# Patient Record
Sex: Female | Born: 1950
Health system: Southern US, Community
[De-identification: ages and names within clinical notes are randomized; demographics above are authoritative.]

## PROBLEM LIST (undated history)

## (undated) DIAGNOSIS — N951 Menopausal and female climacteric states: Secondary | ICD-10-CM

## (undated) DIAGNOSIS — E785 Hyperlipidemia, unspecified: Secondary | ICD-10-CM

## (undated) DIAGNOSIS — I10 Essential (primary) hypertension: Secondary | ICD-10-CM

## (undated) HISTORY — DX: Menopausal and female climacteric states: N95.1

## (undated) HISTORY — PX: TUBAL LIGATION: SHX77

## (undated) HISTORY — PX: MOUTH SURGERY: SHX715

## (undated) HISTORY — PX: TONSILLECTOMY: SUR1361

## (undated) HISTORY — DX: Essential (primary) hypertension: I10

## (undated) HISTORY — DX: Hyperlipidemia, unspecified: E78.5

---

## 1997-12-03 ENCOUNTER — Encounter: Admission: RE | Admit: 1997-12-03 | Discharge: 1997-12-03 | Payer: Self-pay | Admitting: Internal Medicine

## 1998-02-06 ENCOUNTER — Encounter: Admission: RE | Admit: 1998-02-06 | Discharge: 1998-02-06 | Payer: Self-pay | Admitting: Hematology and Oncology

## 1998-08-20 ENCOUNTER — Encounter: Payer: Self-pay | Admitting: Emergency Medicine

## 1998-08-20 ENCOUNTER — Emergency Department (HOSPITAL_COMMUNITY): Admission: EM | Admit: 1998-08-20 | Discharge: 1998-08-20 | Payer: Self-pay | Admitting: Internal Medicine

## 1998-11-09 ENCOUNTER — Emergency Department (HOSPITAL_COMMUNITY): Admission: EM | Admit: 1998-11-09 | Discharge: 1998-11-09 | Payer: Self-pay | Admitting: Emergency Medicine

## 1998-11-09 ENCOUNTER — Encounter: Payer: Self-pay | Admitting: Emergency Medicine

## 1999-08-10 ENCOUNTER — Emergency Department (HOSPITAL_COMMUNITY): Admission: EM | Admit: 1999-08-10 | Discharge: 1999-08-10 | Payer: Self-pay | Admitting: Internal Medicine

## 1999-08-24 ENCOUNTER — Emergency Department (HOSPITAL_COMMUNITY): Admission: EM | Admit: 1999-08-24 | Discharge: 1999-08-24 | Payer: Self-pay | Admitting: *Deleted

## 2000-01-28 ENCOUNTER — Encounter: Admission: RE | Admit: 2000-01-28 | Discharge: 2000-01-28 | Payer: Self-pay | Admitting: Internal Medicine

## 2000-02-11 ENCOUNTER — Encounter: Admission: RE | Admit: 2000-02-11 | Discharge: 2000-02-11 | Payer: Self-pay | Admitting: Internal Medicine

## 2000-06-14 ENCOUNTER — Encounter: Payer: Self-pay | Admitting: Emergency Medicine

## 2000-06-14 ENCOUNTER — Emergency Department (HOSPITAL_COMMUNITY): Admission: EM | Admit: 2000-06-14 | Discharge: 2000-06-14 | Payer: Self-pay | Admitting: Emergency Medicine

## 2000-06-28 ENCOUNTER — Emergency Department (HOSPITAL_COMMUNITY): Admission: EM | Admit: 2000-06-28 | Discharge: 2000-06-28 | Payer: Self-pay | Admitting: Emergency Medicine

## 2000-11-21 ENCOUNTER — Encounter: Admission: RE | Admit: 2000-11-21 | Discharge: 2000-11-21 | Payer: Self-pay | Admitting: Hematology and Oncology

## 2000-11-23 ENCOUNTER — Encounter: Payer: Self-pay | Admitting: Emergency Medicine

## 2000-11-23 ENCOUNTER — Emergency Department (HOSPITAL_COMMUNITY): Admission: EM | Admit: 2000-11-23 | Discharge: 2000-11-23 | Payer: Self-pay | Admitting: Emergency Medicine

## 2001-03-05 ENCOUNTER — Encounter: Admission: RE | Admit: 2001-03-05 | Discharge: 2001-03-05 | Payer: Self-pay | Admitting: Internal Medicine

## 2001-10-25 ENCOUNTER — Emergency Department (HOSPITAL_COMMUNITY): Admission: EM | Admit: 2001-10-25 | Discharge: 2001-10-25 | Payer: Self-pay | Admitting: Emergency Medicine

## 2002-01-22 ENCOUNTER — Emergency Department (HOSPITAL_COMMUNITY): Admission: EM | Admit: 2002-01-22 | Discharge: 2002-01-22 | Payer: Self-pay | Admitting: Emergency Medicine

## 2002-02-03 ENCOUNTER — Emergency Department (HOSPITAL_COMMUNITY): Admission: EM | Admit: 2002-02-03 | Discharge: 2002-02-03 | Payer: Self-pay | Admitting: Emergency Medicine

## 2002-08-23 ENCOUNTER — Emergency Department (HOSPITAL_COMMUNITY): Admission: EM | Admit: 2002-08-23 | Discharge: 2002-08-23 | Payer: Self-pay | Admitting: Emergency Medicine

## 2002-08-26 ENCOUNTER — Encounter: Payer: Self-pay | Admitting: Internal Medicine

## 2002-08-26 ENCOUNTER — Emergency Department (HOSPITAL_COMMUNITY): Admission: EM | Admit: 2002-08-26 | Discharge: 2002-08-26 | Payer: Self-pay | Admitting: Emergency Medicine

## 2002-09-04 ENCOUNTER — Encounter: Admission: RE | Admit: 2002-09-04 | Discharge: 2002-09-04 | Payer: Self-pay | Admitting: Internal Medicine

## 2002-11-28 ENCOUNTER — Emergency Department (HOSPITAL_COMMUNITY): Admission: EM | Admit: 2002-11-28 | Discharge: 2002-11-28 | Payer: Self-pay | Admitting: Emergency Medicine

## 2002-11-28 ENCOUNTER — Encounter: Payer: Self-pay | Admitting: Emergency Medicine

## 2002-12-06 ENCOUNTER — Emergency Department (HOSPITAL_COMMUNITY): Admission: EM | Admit: 2002-12-06 | Discharge: 2002-12-07 | Payer: Self-pay | Admitting: Emergency Medicine

## 2002-12-11 ENCOUNTER — Encounter: Admission: RE | Admit: 2002-12-11 | Discharge: 2002-12-11 | Payer: Self-pay | Admitting: Internal Medicine

## 2003-01-13 ENCOUNTER — Encounter: Payer: Self-pay | Admitting: Internal Medicine

## 2003-01-13 ENCOUNTER — Ambulatory Visit (HOSPITAL_COMMUNITY): Admission: RE | Admit: 2003-01-13 | Discharge: 2003-01-13 | Payer: Self-pay | Admitting: Internal Medicine

## 2003-01-20 ENCOUNTER — Emergency Department (HOSPITAL_COMMUNITY): Admission: EM | Admit: 2003-01-20 | Discharge: 2003-01-20 | Payer: Self-pay | Admitting: Emergency Medicine

## 2003-01-22 ENCOUNTER — Encounter: Admission: RE | Admit: 2003-01-22 | Discharge: 2003-01-22 | Payer: Self-pay | Admitting: Internal Medicine

## 2003-10-01 ENCOUNTER — Encounter: Admission: RE | Admit: 2003-10-01 | Discharge: 2003-10-01 | Payer: Self-pay | Admitting: Internal Medicine

## 2003-12-28 ENCOUNTER — Emergency Department (HOSPITAL_COMMUNITY): Admission: EM | Admit: 2003-12-28 | Discharge: 2003-12-28 | Payer: Self-pay | Admitting: Emergency Medicine

## 2003-12-30 ENCOUNTER — Emergency Department (HOSPITAL_COMMUNITY): Admission: EM | Admit: 2003-12-30 | Discharge: 2003-12-30 | Payer: Self-pay | Admitting: Emergency Medicine

## 2003-12-30 IMAGING — CT CT HEAD W/O CM
1 series · 15 of 28 positions shown, 19 images · non-contrast
Comparison: none

CLINICAL DATA: Severe headache.  Chest pain and positive D-dimer.  
HEAD CT WITHOUT CONTRAST
Routine non-contrast head CT was performed.

[Series 2: head routi 5.0 h30s · axial · 0.43mm/px · z∈[+928,+1053]mm · 15 of 28 slices shown, 19 images]
[im 2/28  brain]
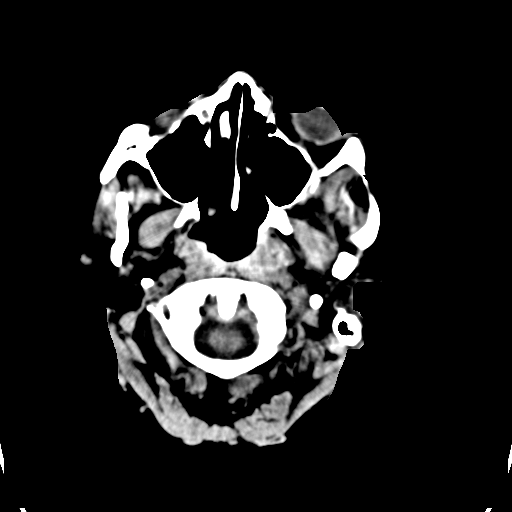
[im 2/28  bone]
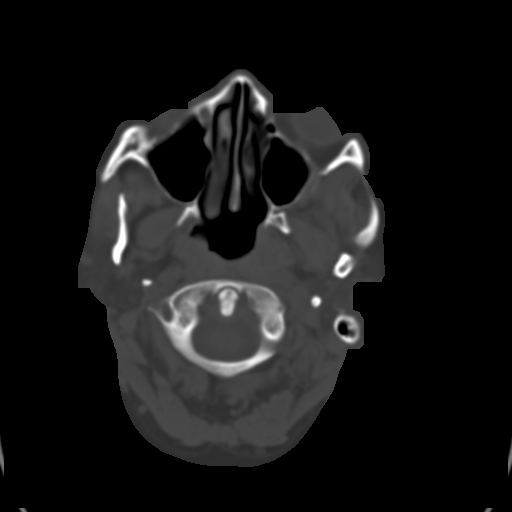
[im 4/28  brain]
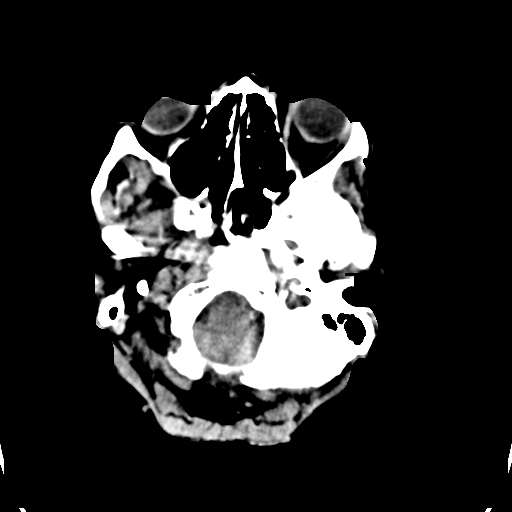
[im 6/28  brain]
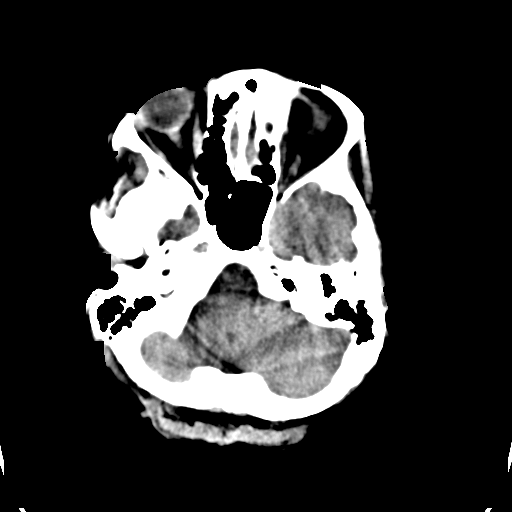
[im 8/28  brain]
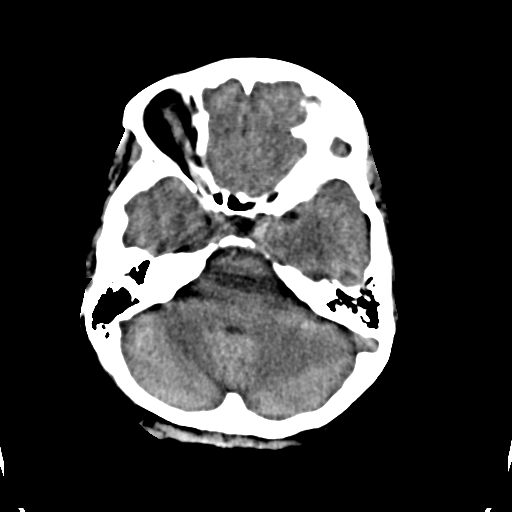
[im 9/28  brain]
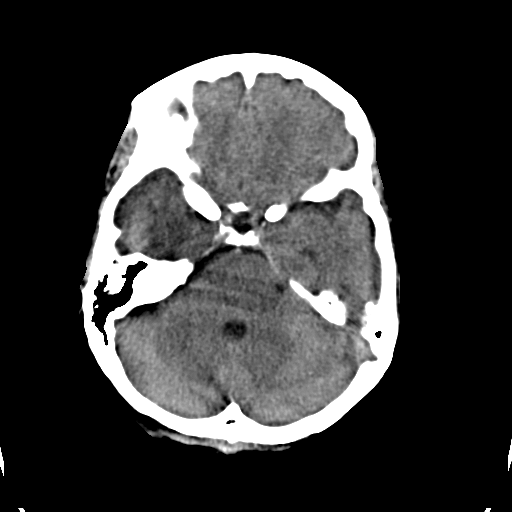
[im 9/28  bone]
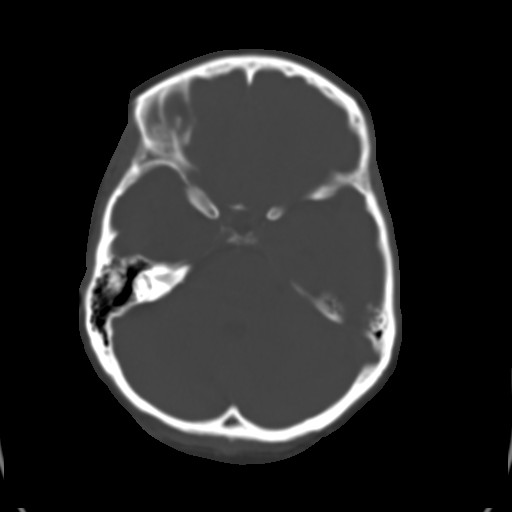
[im 11/28  brain]
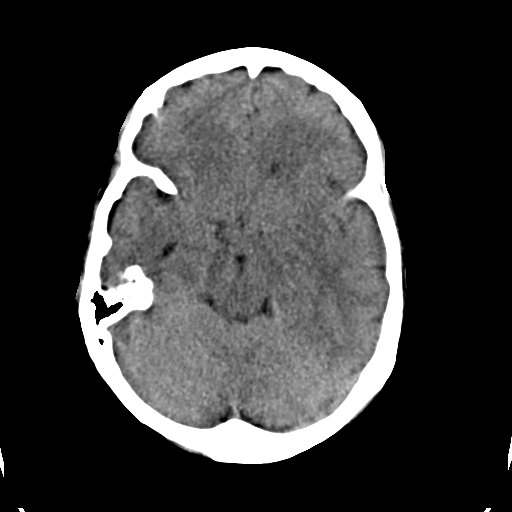
[im 13/28  brain]
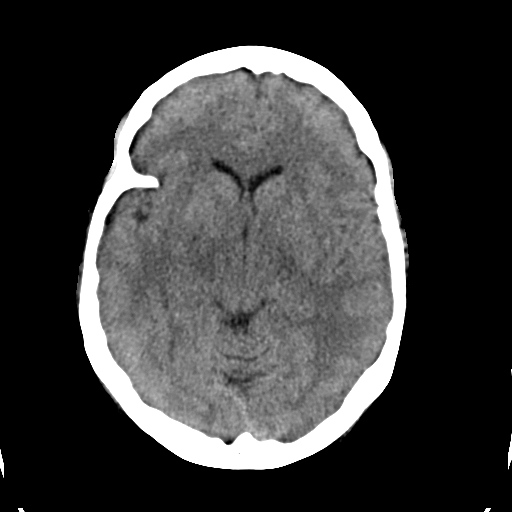
[im 15/28  brain]
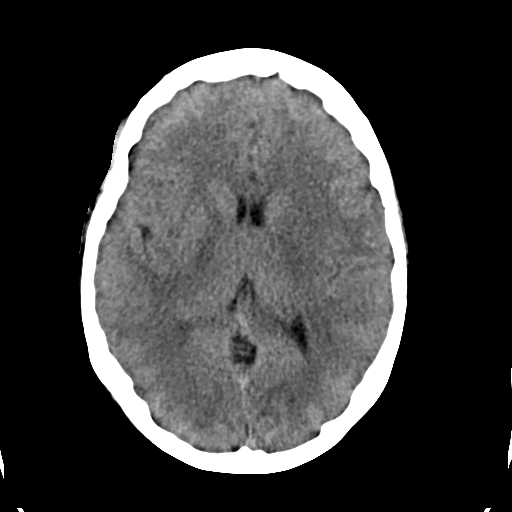
[im 16/28  brain]
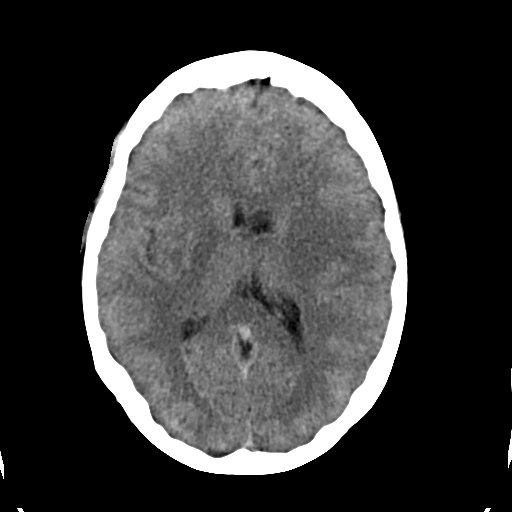
[im 16/28  bone]
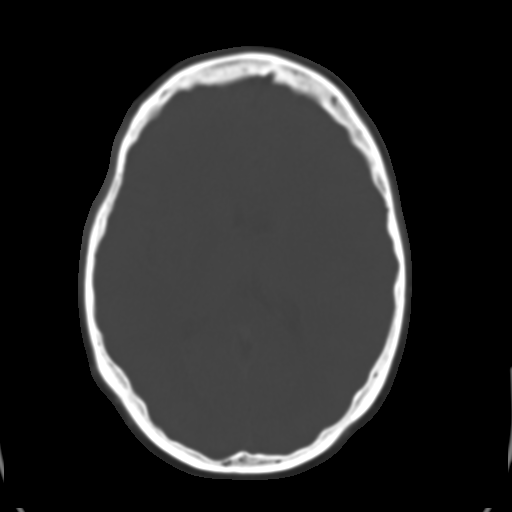
[im 18/28  brain]
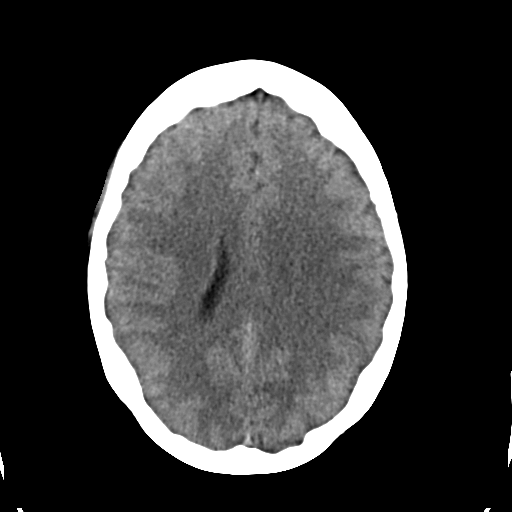
[im 20/28  brain]
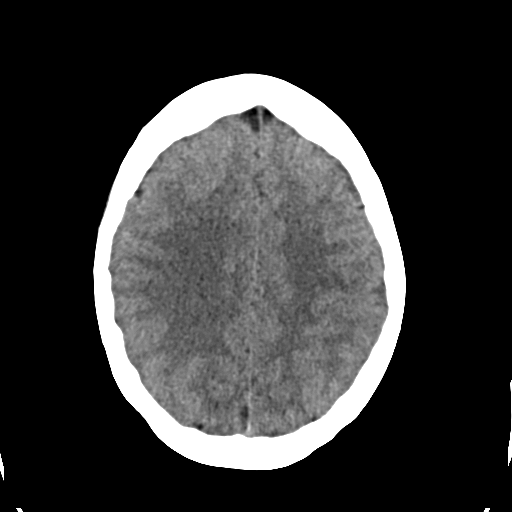
[im 21/28  brain]
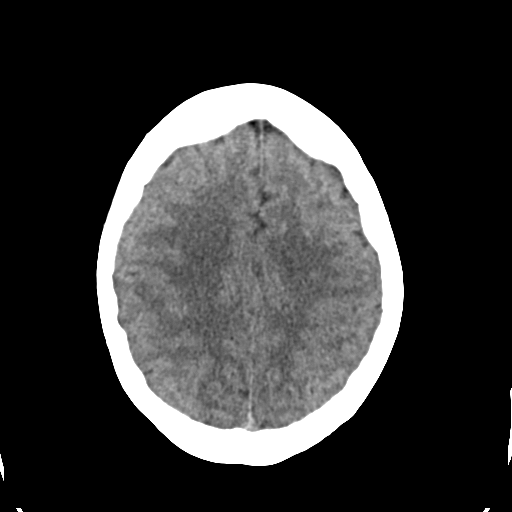
[im 23/28  brain]
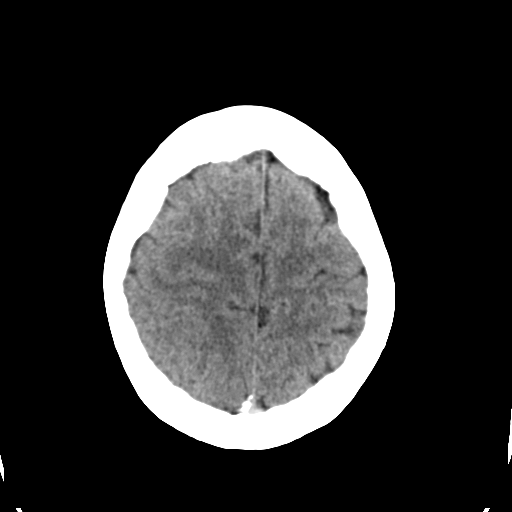
[im 23/28  bone]
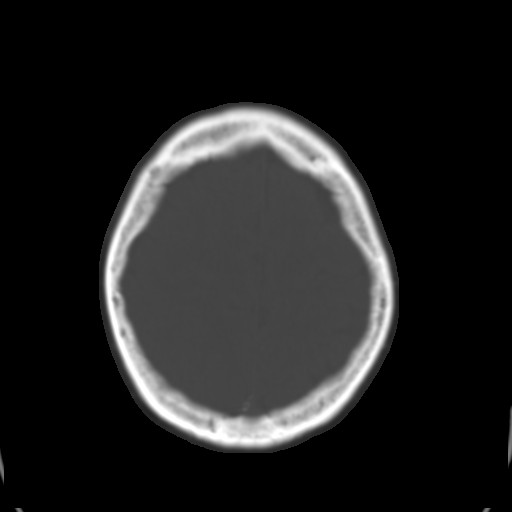
[im 25/28  brain]
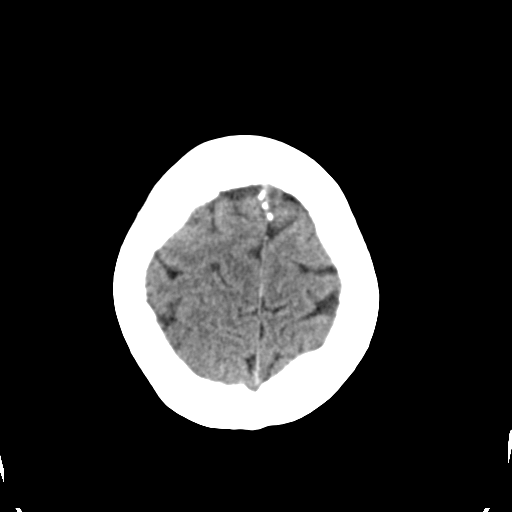
[im 27/28  brain]
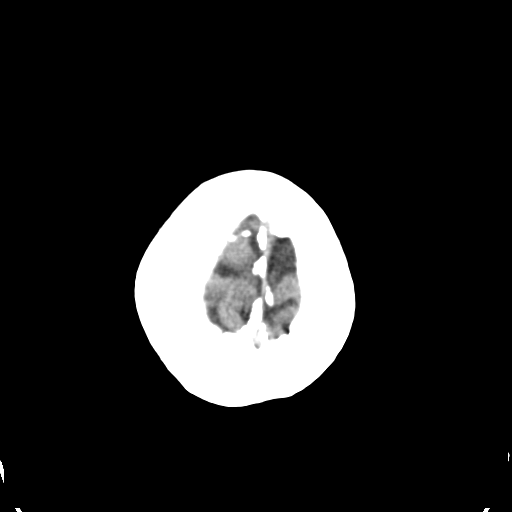

[15 of 28 positions shown; findings below may reference images not displayed]

There is no evidence of intracranial hemorrhage, brain edema, or mass effect. The ventricles are normal. No extra-axial abnormalities are identified. Bone windows show no significant abnormalities.

IMPRESSION
Negative non-contrast head CT. 

CHEST CT ANGIO WITH CONTRAST:

Multidetector CT imaging of the chest was performed according to the protocol for detection of pulmonary embolism during IV bolus injection of 150 ml Omnipaque 300.  Coronal and sagittal plane reformatted images were also generated.  
Satisfactory opacification of the pulmonary arteries is seen, and there is no evidence of pulmonary embolism . There is no evidence of thoracic aortic aneurysm or dissection.  There is no evidence of hilar or mediastinal masses.  Mild pleural-parenchymal scarring is seen in the lateral aspect of the right lower lobe.  There is no evidence of pulmonary infiltrate or suspicious pulmonary nodules or masses.  There is no evidence of pleural or pericardial effusion.
IMPRESSION
1.  No CT evidence of acute pulmonary embolism or other active disease.  
2.  Mild right lower lobe scarring.

## 2003-12-30 IMAGING — CR DG CHEST 2V
2 series · 2 of 2 positions shown · non-contrast
Comparison: none

CLINICAL DATA: Chest pain. Hypertension.  
 TWO VIEW CHEST
 No priors for comparison.  Borderline cardiomegaly.  No focal infiltrates or CHF.  No pulmonary  nodules are identified.  There is no pneumothorax. Mildly tortuous aorta.  Bones unremarkable. Minimal scarring right base. 
 IMPRESSION
 Borderline cardiomegaly, no active process.

[view not recorded (1 of 2)]
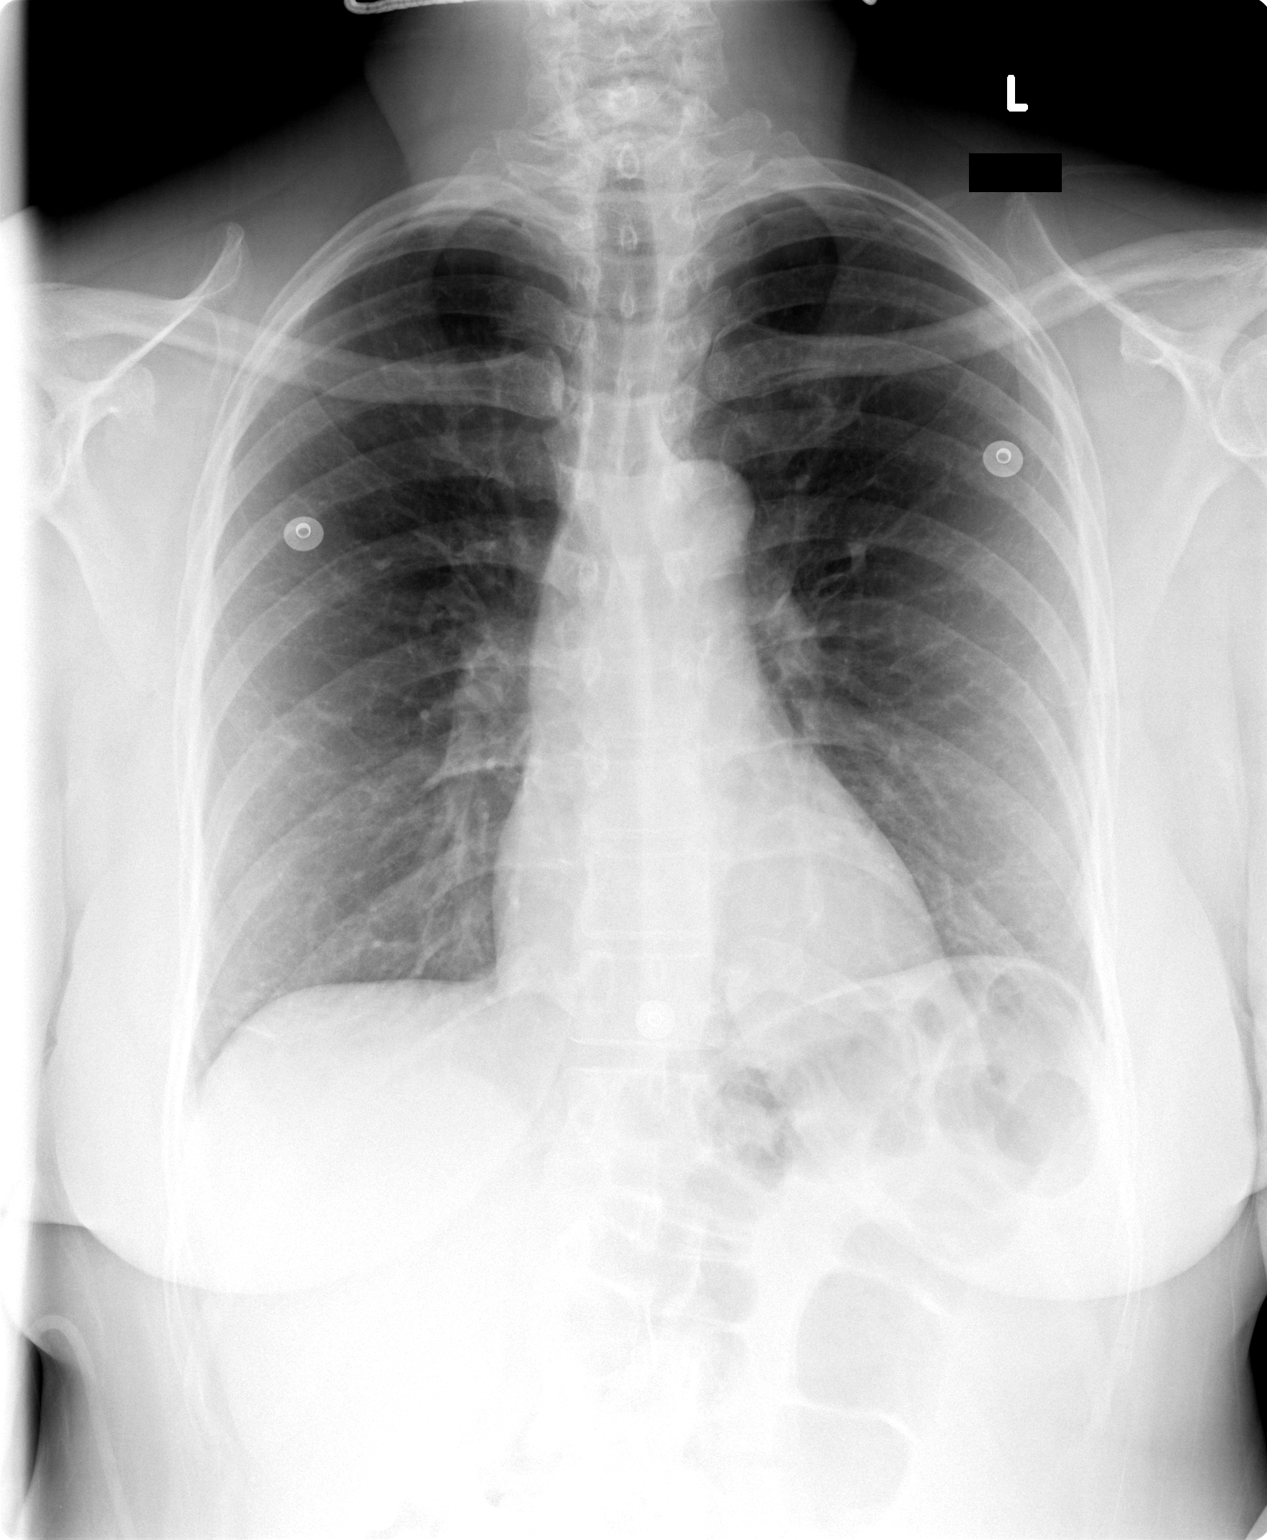

[view not recorded (2 of 2)]
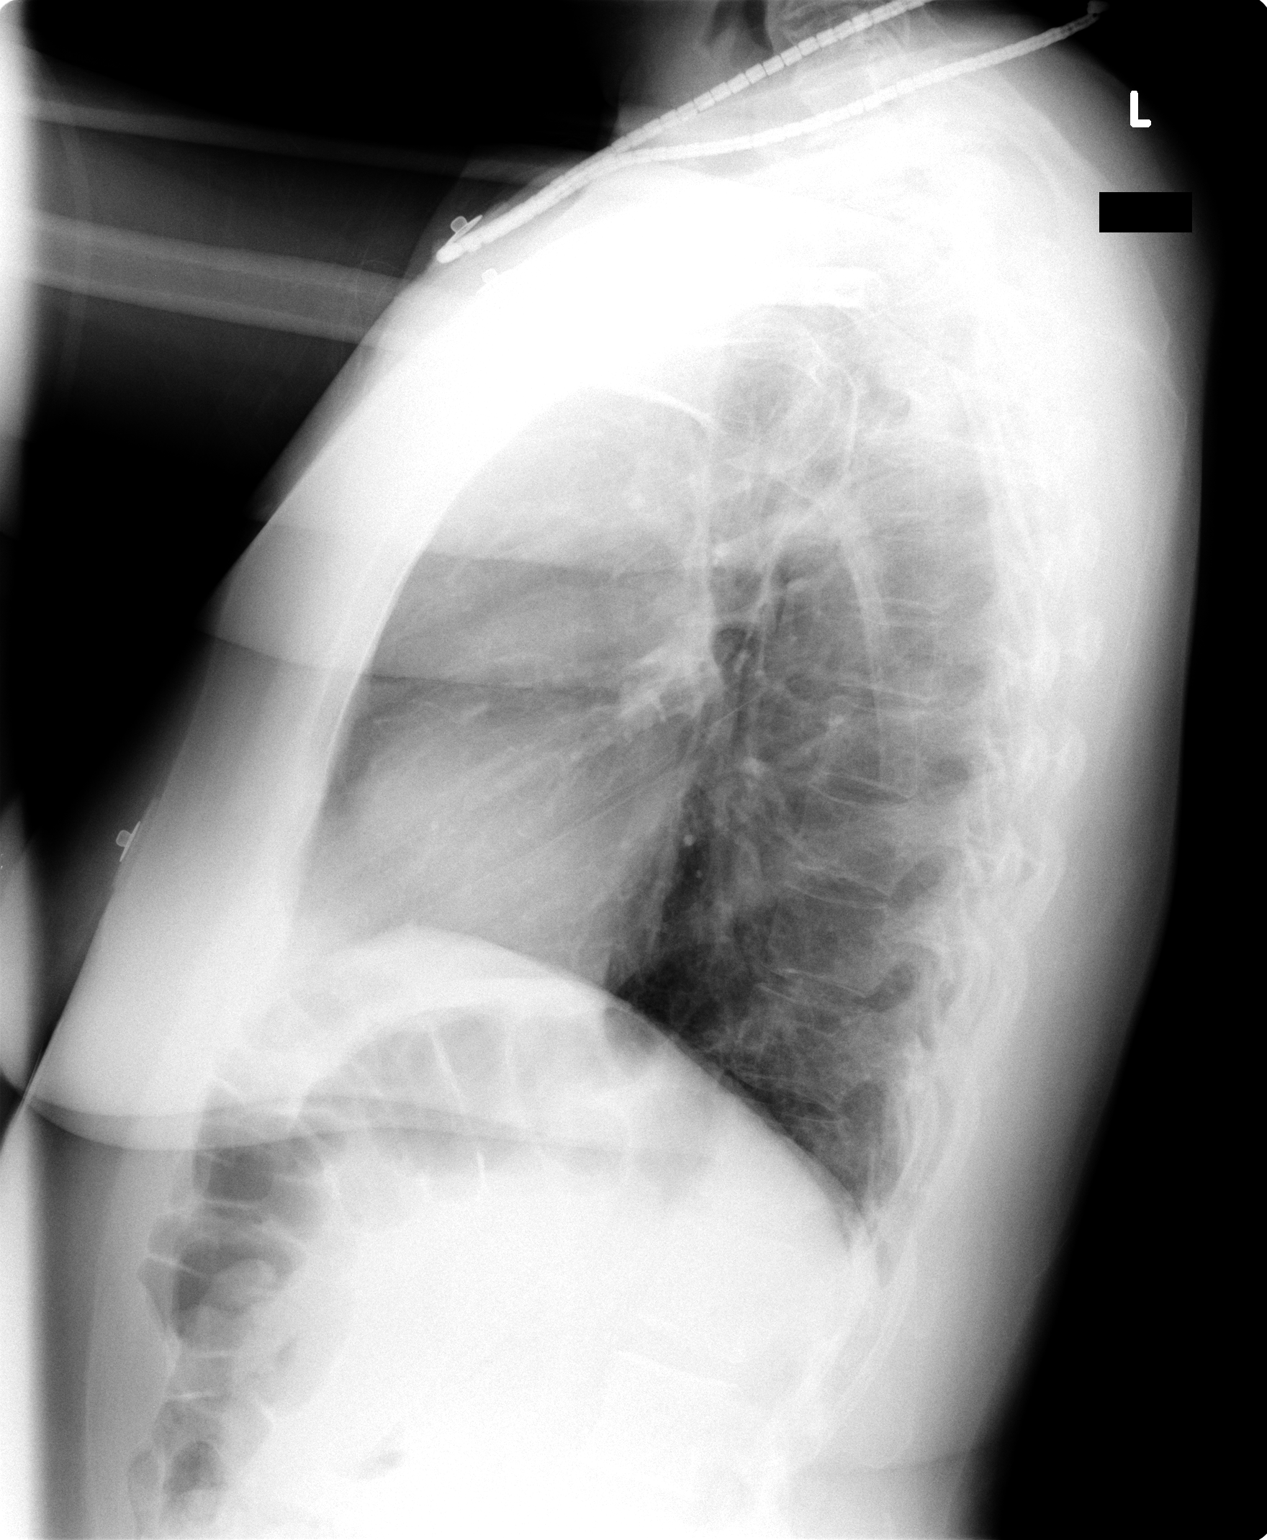

[2 of 2 positions shown; findings below may reference images not displayed]

## 2003-12-30 IMAGING — CT CT HEAD W/O CM
1 of 3 series · 15 of 30 positions shown, 19 images · non-contrast
Comparison: none

CLINICAL DATA: Severe headache.  Chest pain and positive D-dimer.  
HEAD CT WITHOUT CONTRAST
Routine non-contrast head CT was performed.

[Series 5: chest/pe 1.0 b10f · axial · 0.58mm/px · z∈[+159,+401]mm · 15 of 528 slices shown, 19 images]
[im 22/528  brain]
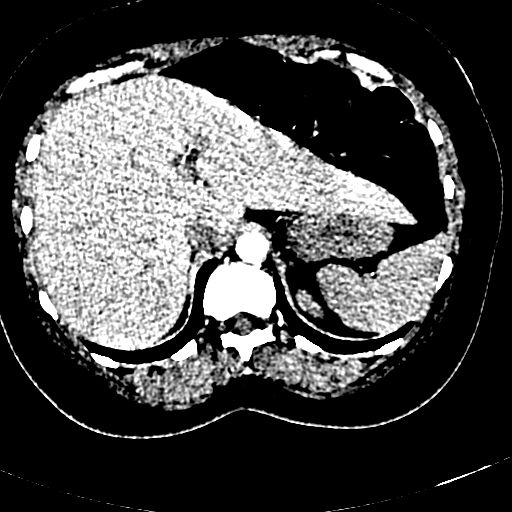
[im 22/528  bone]
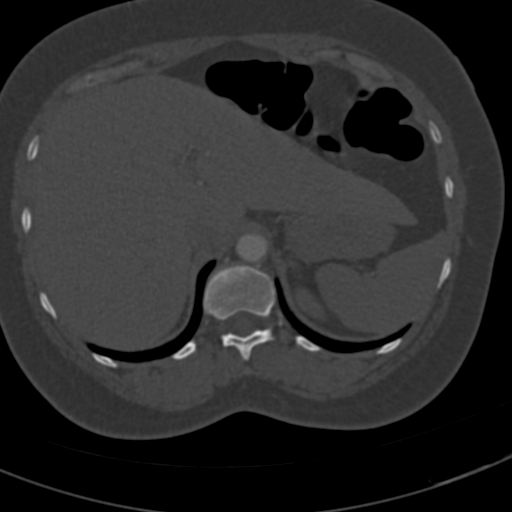
[im 66/528  brain]
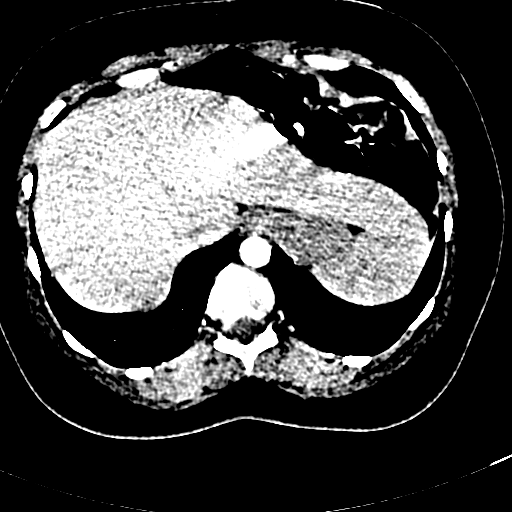
[im 88/528  brain]
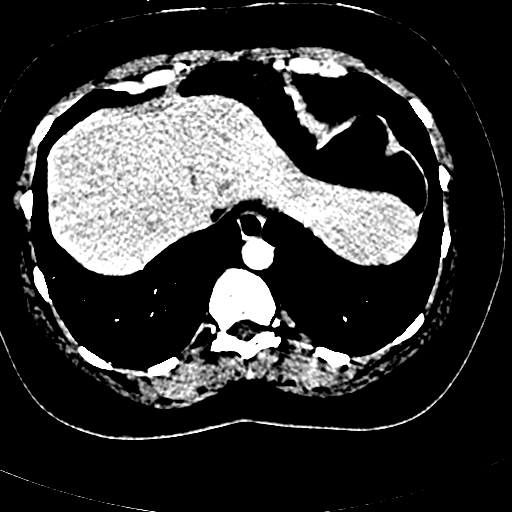
[im 132/528  brain]
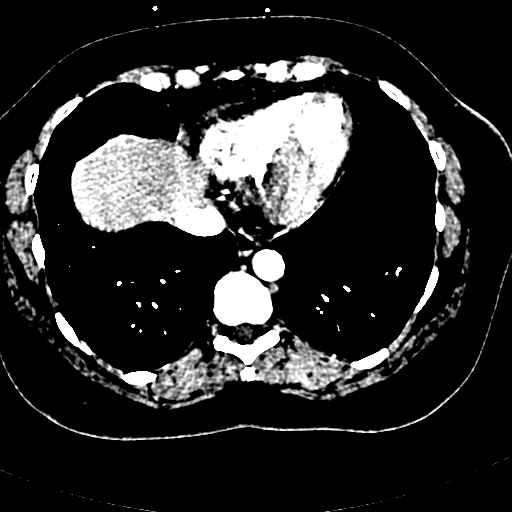
[im 154/528  brain]
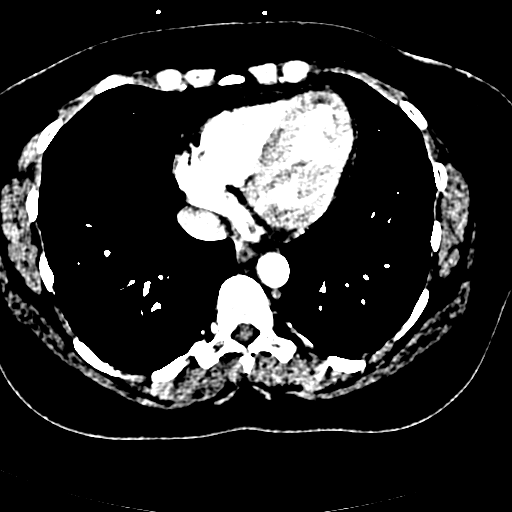
[im 154/528  bone]
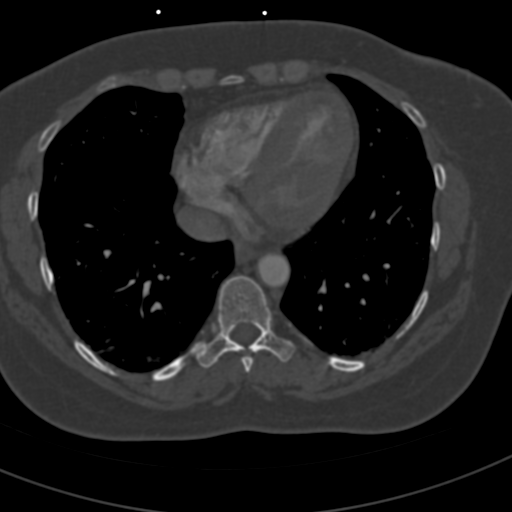
[im 198/528  brain]
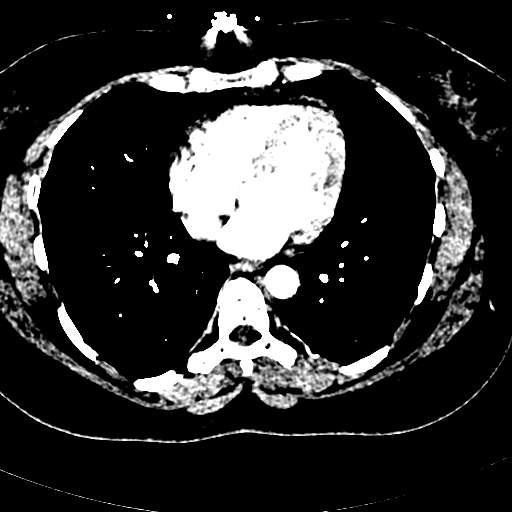
[im 220/528  brain]
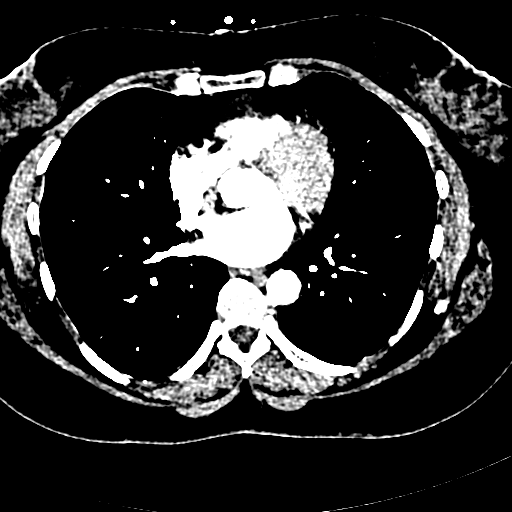
[im 264/528  brain]
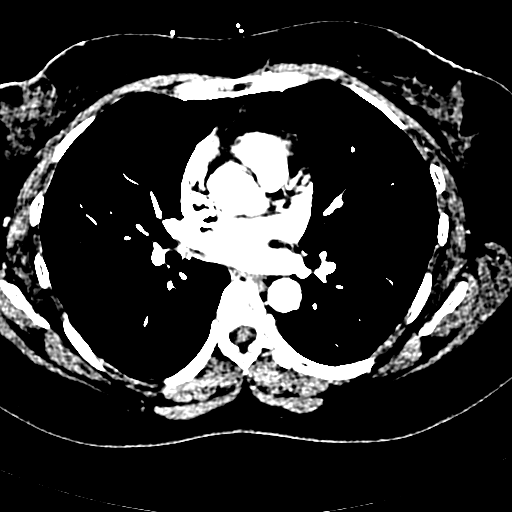
[im 308/528  brain]
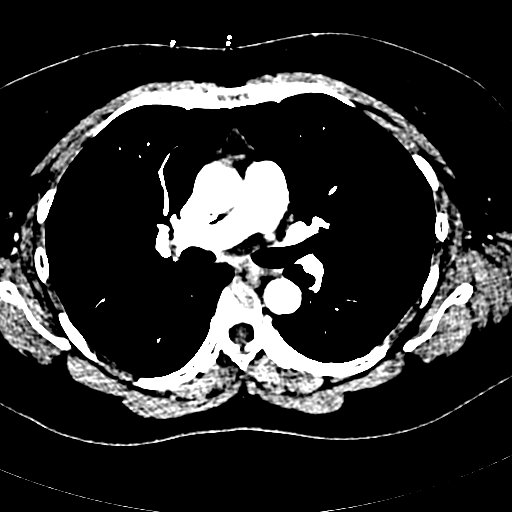
[im 308/528  bone]
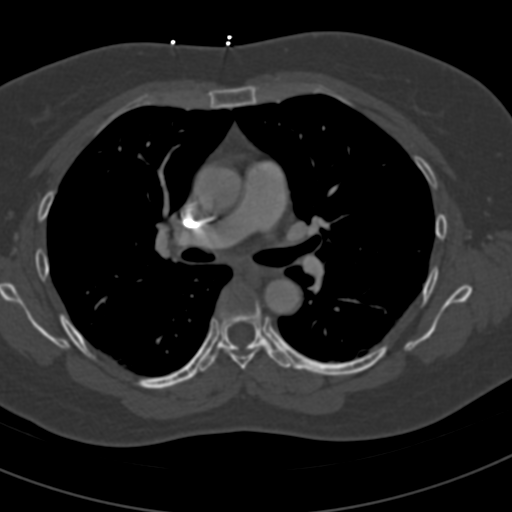
[im 330/528  brain]
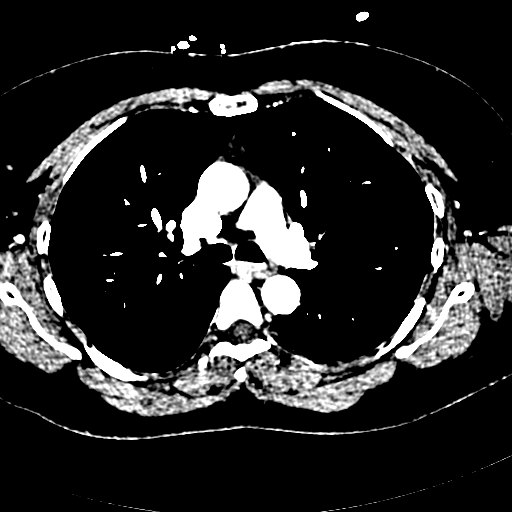
[im 374/528  brain]
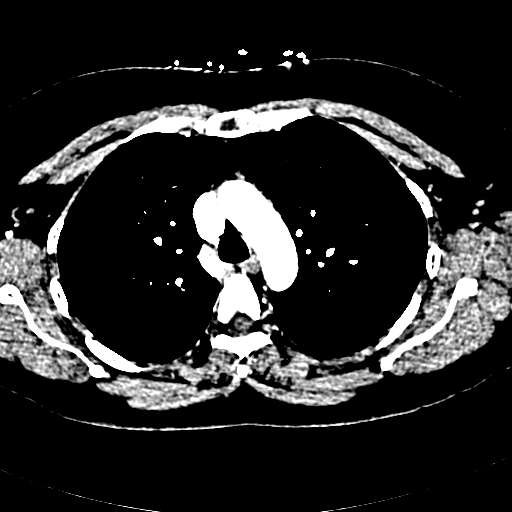
[im 396/528  brain]
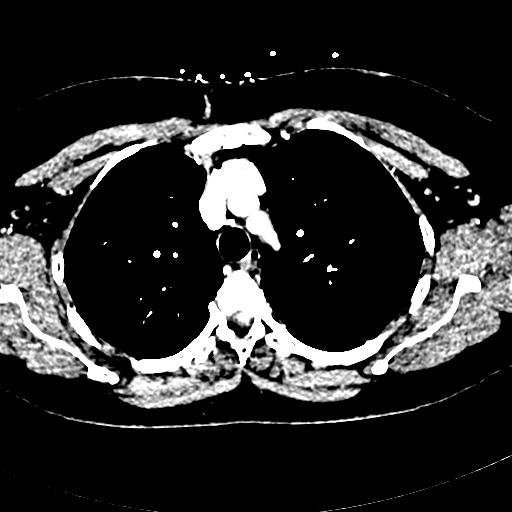
[im 440/528  brain]
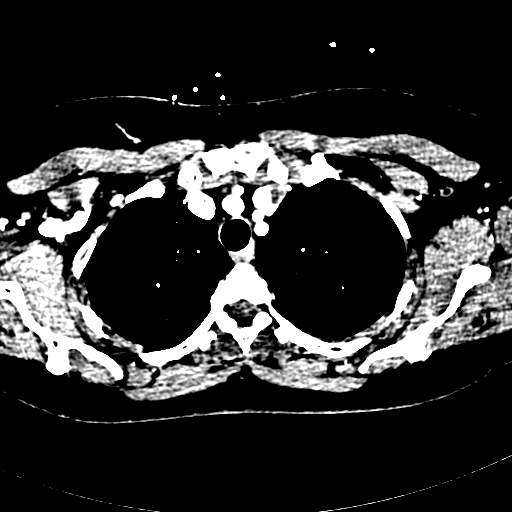
[im 440/528  bone]
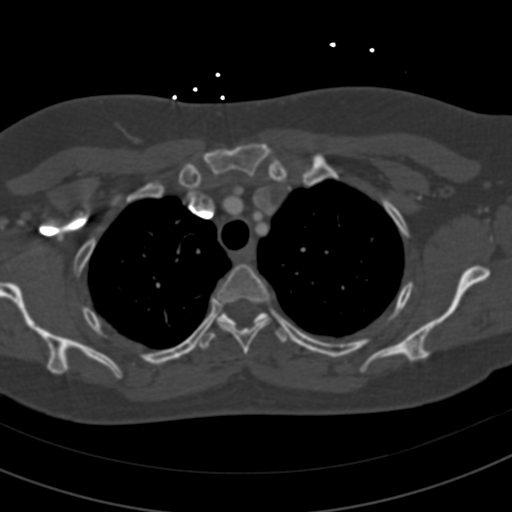
[im 462/528  brain]
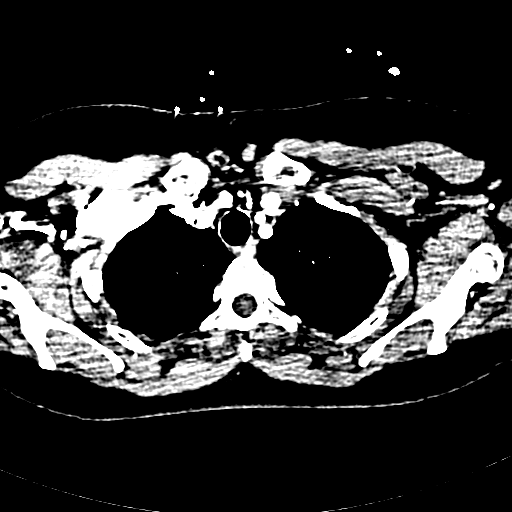
[im 506/528  brain]
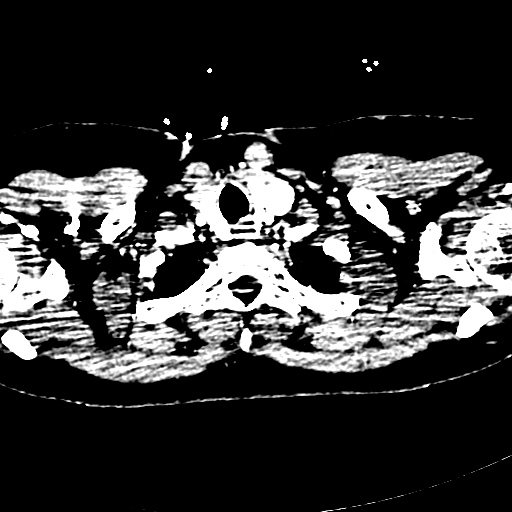

[15 of 30 positions shown; findings below may reference images not displayed]

There is no evidence of intracranial hemorrhage, brain edema, or mass effect. The ventricles are normal. No extra-axial abnormalities are identified. Bone windows show no significant abnormalities.

IMPRESSION
Negative non-contrast head CT. 

CHEST CT ANGIO WITH CONTRAST:

Multidetector CT imaging of the chest was performed according to the protocol for detection of pulmonary embolism during IV bolus injection of 150 ml Omnipaque 300.  Coronal and sagittal plane reformatted images were also generated.  
Satisfactory opacification of the pulmonary arteries is seen, and there is no evidence of pulmonary embolism . There is no evidence of thoracic aortic aneurysm or dissection.  There is no evidence of hilar or mediastinal masses.  Mild pleural-parenchymal scarring is seen in the lateral aspect of the right lower lobe.  There is no evidence of pulmonary infiltrate or suspicious pulmonary nodules or masses.  There is no evidence of pleural or pericardial effusion.
IMPRESSION
1.  No CT evidence of acute pulmonary embolism or other active disease.  
2.  Mild right lower lobe scarring.

## 2004-01-06 ENCOUNTER — Encounter: Admission: RE | Admit: 2004-01-06 | Discharge: 2004-01-06 | Payer: Self-pay | Admitting: Internal Medicine

## 2004-05-03 ENCOUNTER — Emergency Department (HOSPITAL_COMMUNITY): Admission: EM | Admit: 2004-05-03 | Discharge: 2004-05-03 | Payer: Self-pay | Admitting: Family Medicine

## 2004-08-30 ENCOUNTER — Ambulatory Visit: Payer: Self-pay | Admitting: Internal Medicine

## 2004-09-02 ENCOUNTER — Ambulatory Visit (HOSPITAL_COMMUNITY): Admission: RE | Admit: 2004-09-02 | Discharge: 2004-09-02 | Payer: Self-pay | Admitting: Internal Medicine

## 2004-09-16 ENCOUNTER — Encounter: Admission: RE | Admit: 2004-09-16 | Discharge: 2004-09-16 | Payer: Self-pay | Admitting: Sports Medicine

## 2004-10-25 ENCOUNTER — Emergency Department (HOSPITAL_COMMUNITY): Admission: EM | Admit: 2004-10-25 | Discharge: 2004-10-25 | Payer: Self-pay | Admitting: Emergency Medicine

## 2004-10-27 ENCOUNTER — Ambulatory Visit: Payer: Self-pay | Admitting: Internal Medicine

## 2005-03-15 ENCOUNTER — Emergency Department (HOSPITAL_COMMUNITY): Admission: EM | Admit: 2005-03-15 | Discharge: 2005-03-16 | Payer: Self-pay | Admitting: Emergency Medicine

## 2005-03-15 IMAGING — CR DG CHEST 2V
2 series · 2 of 2 positions shown · non-contrast
Comparison: [DATE]

CLINICAL DATA: Chest pain

CHEST - 2 VIEW:

[view not recorded (1 of 2)]
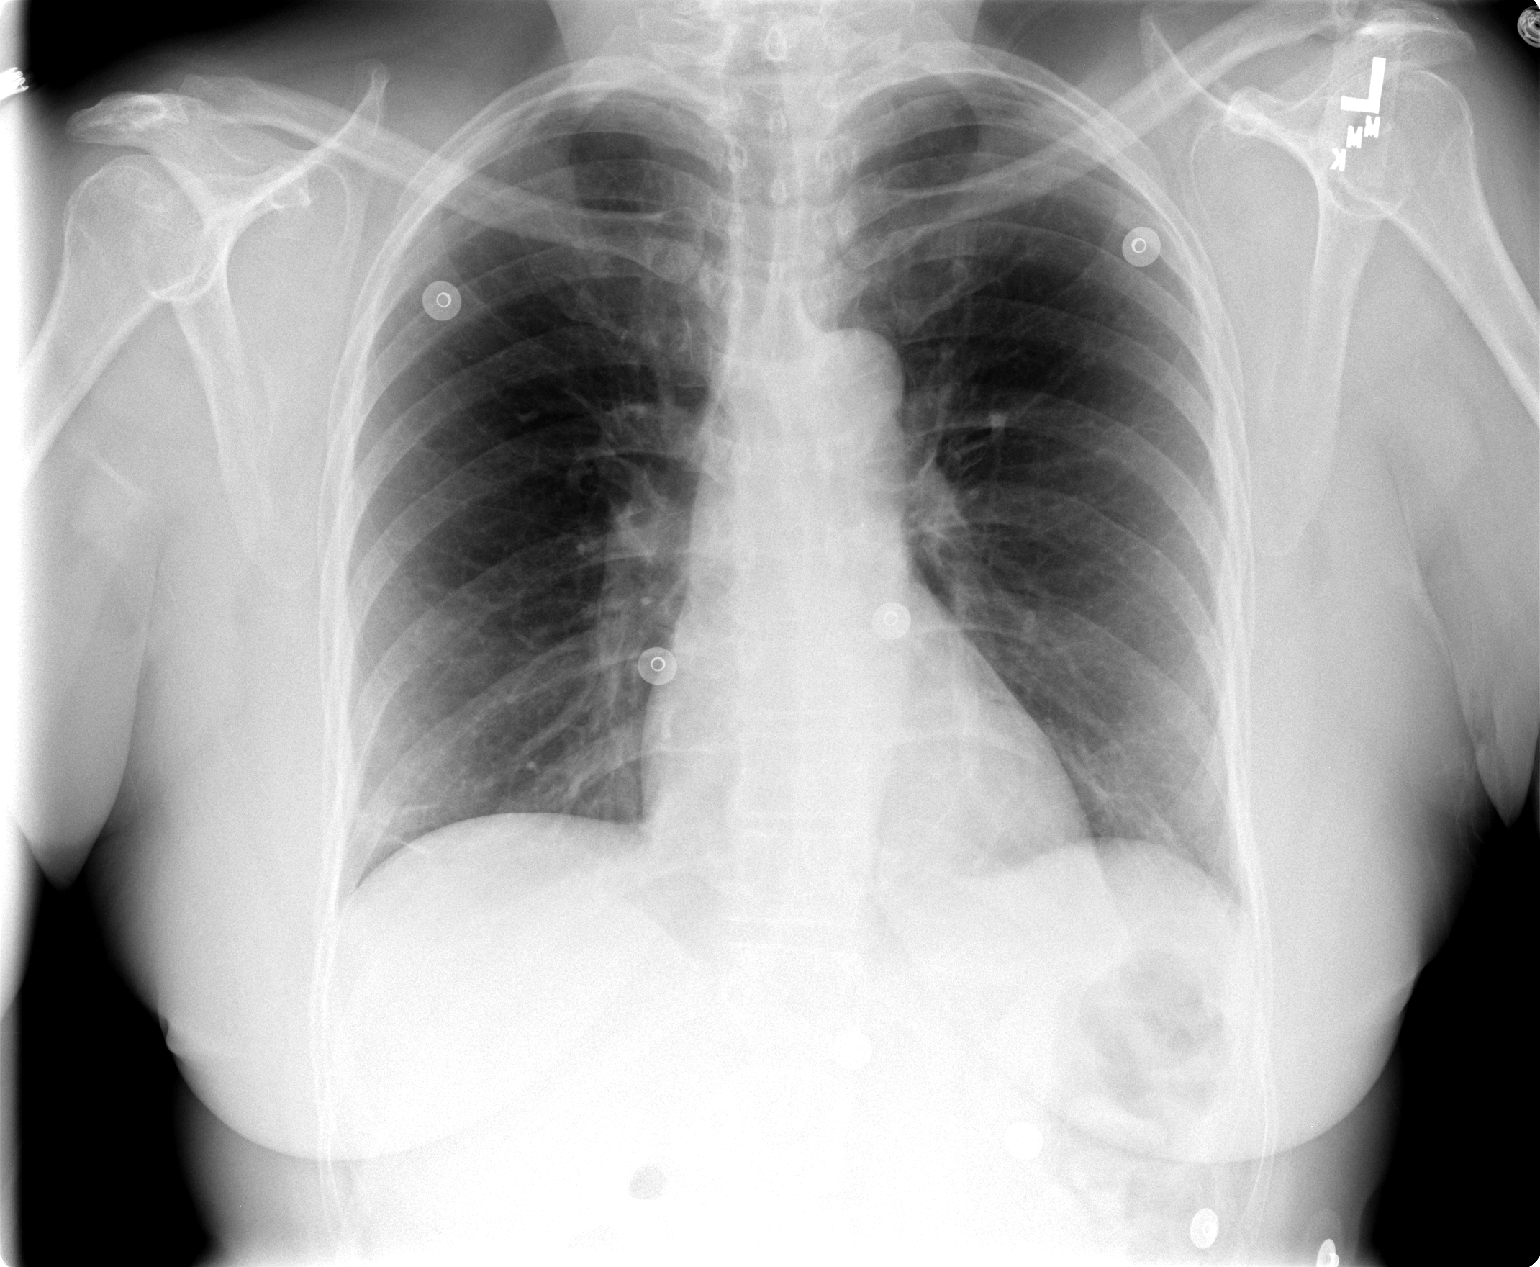

[view not recorded (2 of 2)]
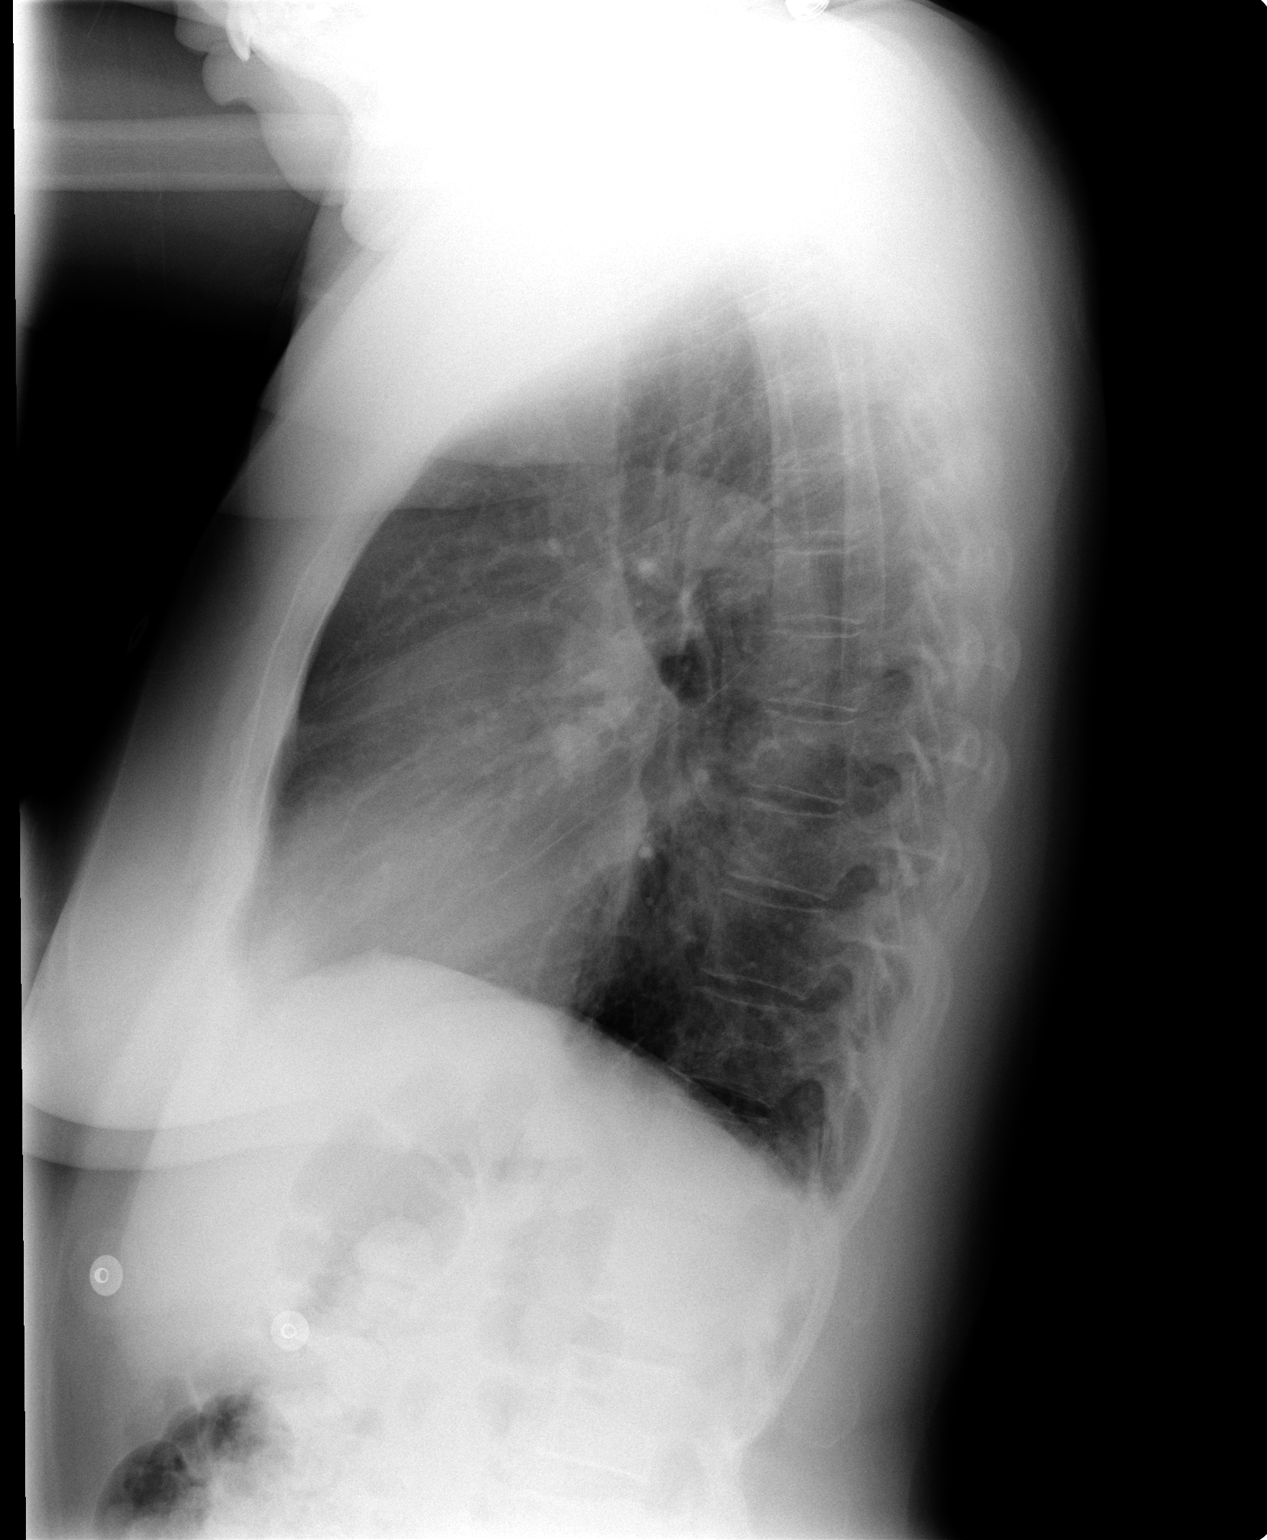

[2 of 2 positions shown; findings below may reference images not displayed]

FINDINGS: Heart and mediastinal contours are within normal limits. Linear
densities are noted in both lung bases, stable since prior study, likely
scarring. No effusions. Visualized skeleton unremarkable
IMPRESSION: Bibasilar scar. No active disease.

## 2005-05-04 ENCOUNTER — Ambulatory Visit: Payer: Self-pay | Admitting: Internal Medicine

## 2005-05-11 ENCOUNTER — Ambulatory Visit: Payer: Self-pay | Admitting: Internal Medicine

## 2005-06-17 ENCOUNTER — Ambulatory Visit: Payer: Self-pay | Admitting: Internal Medicine

## 2005-11-03 ENCOUNTER — Ambulatory Visit (HOSPITAL_COMMUNITY): Admission: RE | Admit: 2005-11-03 | Discharge: 2005-11-03 | Payer: Self-pay | Admitting: Internal Medicine

## 2005-11-03 ENCOUNTER — Ambulatory Visit: Payer: Self-pay | Admitting: Internal Medicine

## 2005-11-03 ENCOUNTER — Encounter (INDEPENDENT_AMBULATORY_CARE_PROVIDER_SITE_OTHER): Payer: Self-pay | Admitting: Internal Medicine

## 2005-11-03 IMAGING — MG MM DIGITAL SCREENING BILAT
6 series · 6 of 6 positions shown · non-contrast
Comparison: none

Bilateral CC and MLO view(s) were taken.

SCREENING MAMMOGRAM:
There is a fibroglandular pattern.  No masses or malignant type calcifications are identified.  
Compared with prior studies.

[R CC]
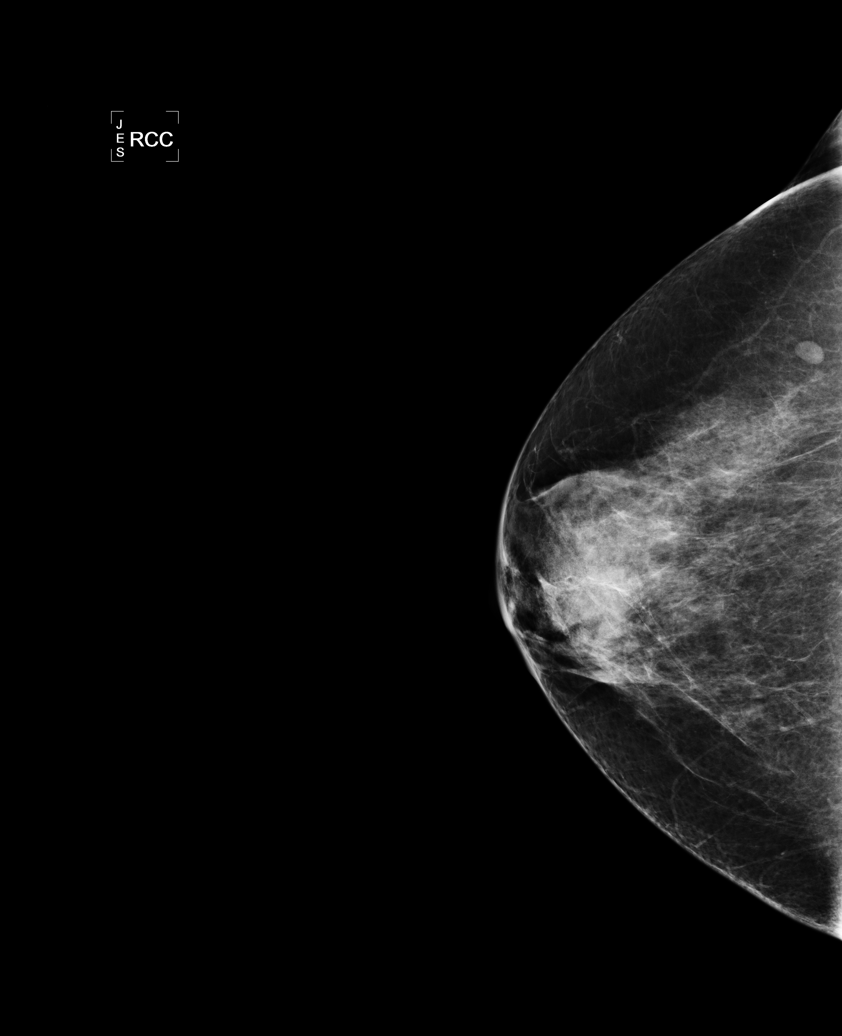

[R MLO (1 of 2)]
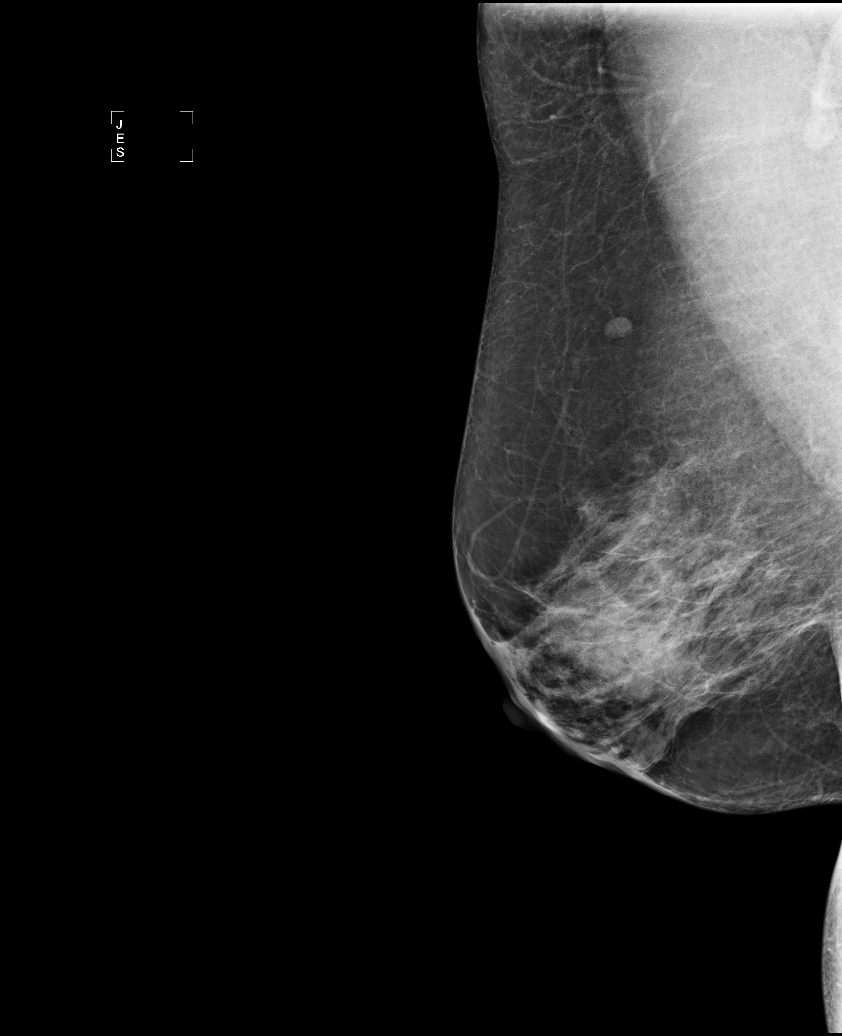

[L CC]
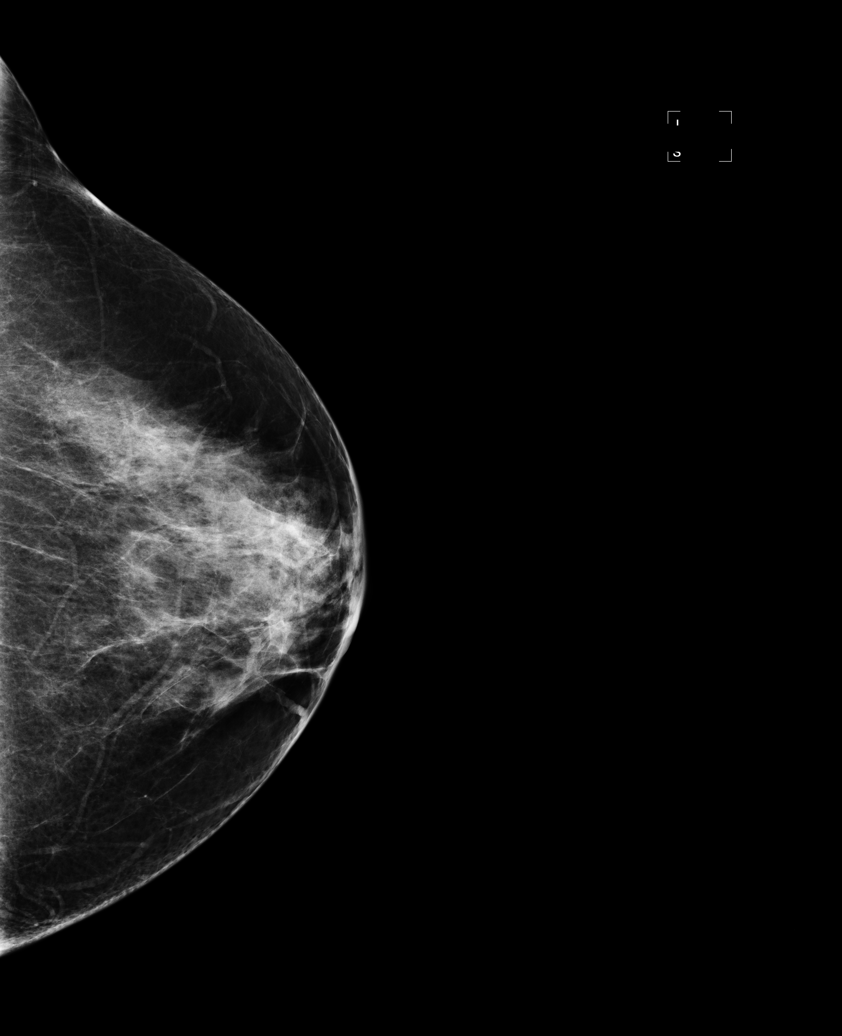

[L MLO (1 of 2)]
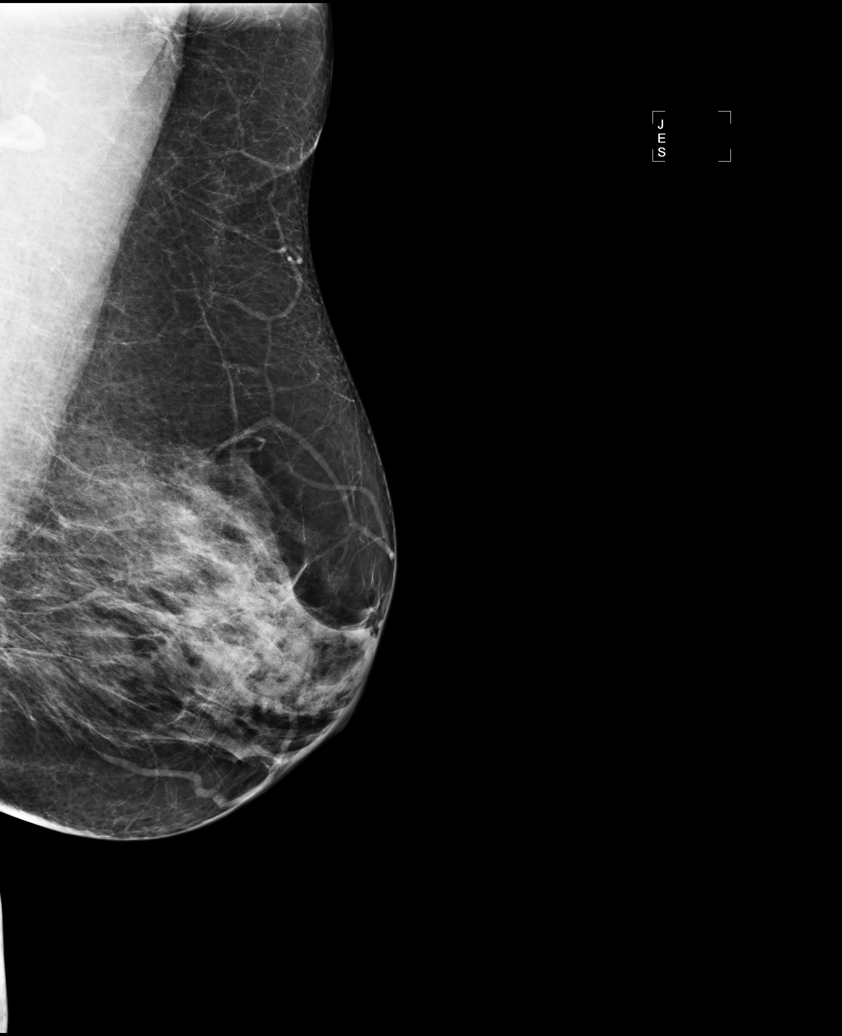

[L MLO (2 of 2)]
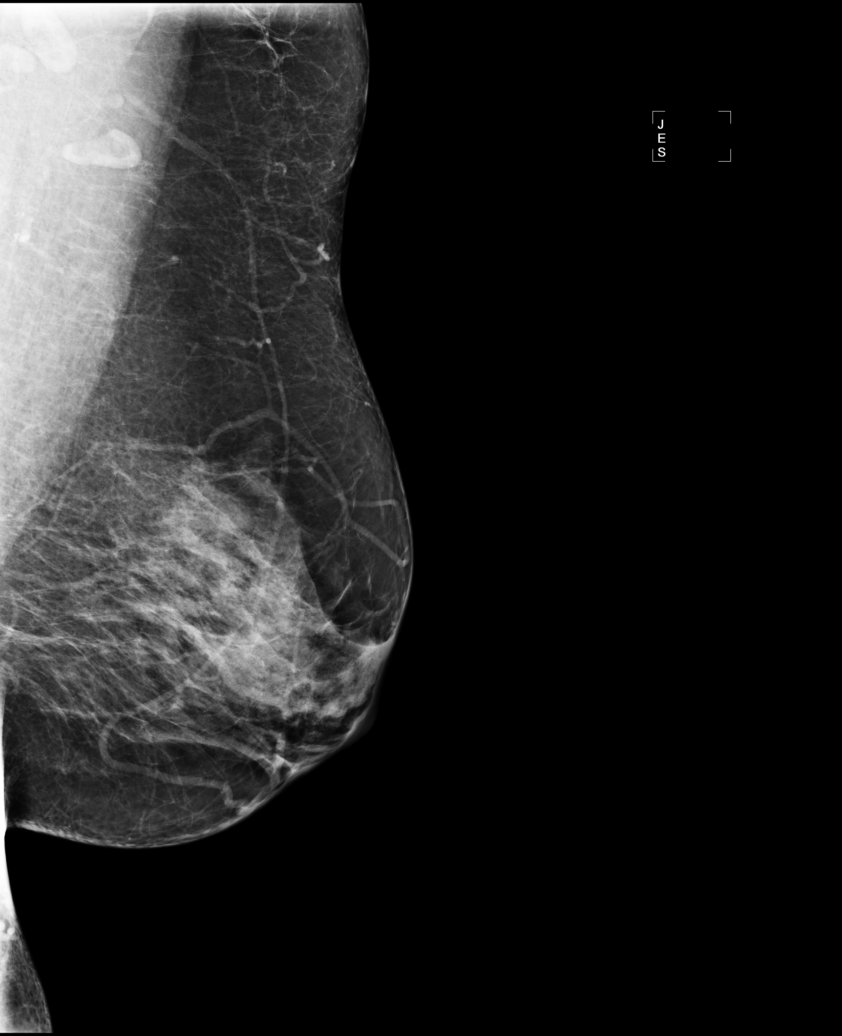

[R MLO (2 of 2)]
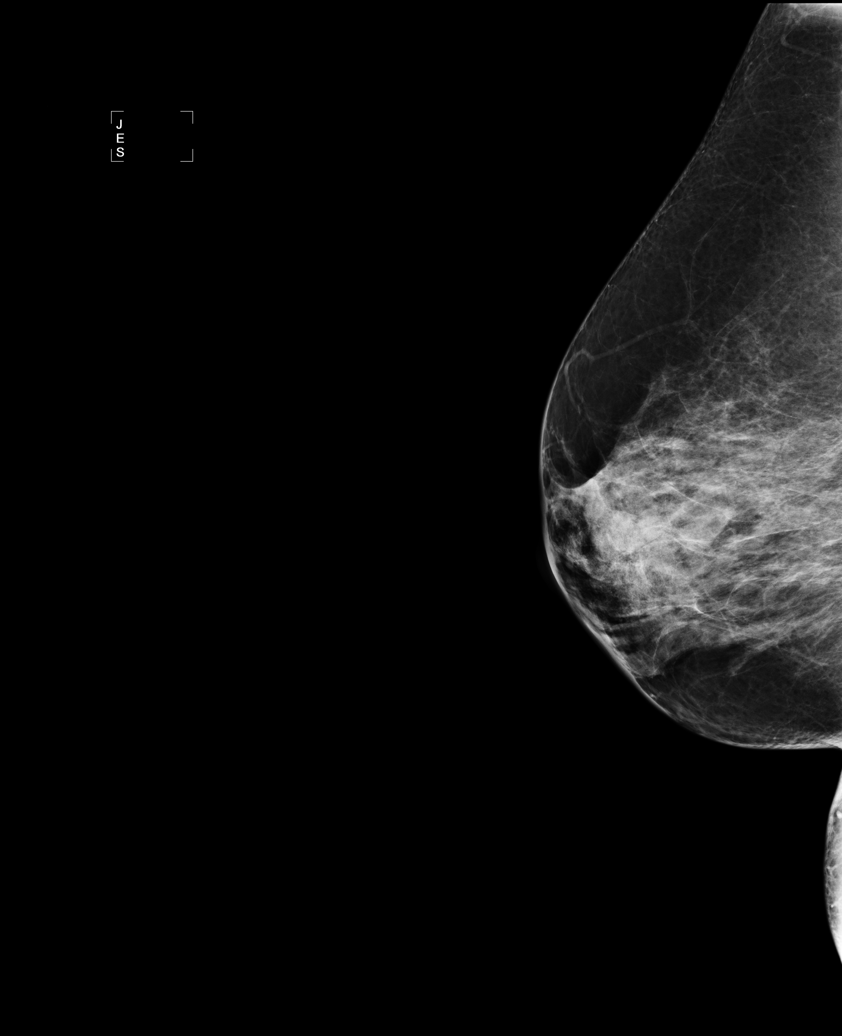

[6 of 6 positions shown; findings below may reference images not displayed]

IMPRESSION: No specific mammographic evidence of malignancy.  Next screening mammogram is recommended in one 
year.

ASSESSMENT: Negative - BI-RADS 1

Screening mammogram in 1 year.

## 2005-11-21 ENCOUNTER — Emergency Department (HOSPITAL_COMMUNITY): Admission: EM | Admit: 2005-11-21 | Discharge: 2005-11-21 | Payer: Self-pay | Admitting: Family Medicine

## 2005-12-30 ENCOUNTER — Ambulatory Visit: Payer: Self-pay | Admitting: Internal Medicine

## 2006-01-03 ENCOUNTER — Emergency Department (HOSPITAL_COMMUNITY): Admission: EM | Admit: 2006-01-03 | Discharge: 2006-01-04 | Payer: Self-pay | Admitting: Emergency Medicine

## 2006-01-20 ENCOUNTER — Ambulatory Visit: Payer: Self-pay | Admitting: Internal Medicine

## 2006-01-26 ENCOUNTER — Encounter: Admission: RE | Admit: 2006-01-26 | Discharge: 2006-04-26 | Payer: Self-pay | Admitting: Internal Medicine

## 2006-01-31 ENCOUNTER — Ambulatory Visit: Payer: Self-pay | Admitting: Internal Medicine

## 2006-05-25 DIAGNOSIS — Z87898 Personal history of other specified conditions: Secondary | ICD-10-CM | POA: Insufficient documentation

## 2006-05-25 DIAGNOSIS — I10 Essential (primary) hypertension: Secondary | ICD-10-CM | POA: Insufficient documentation

## 2006-05-25 DIAGNOSIS — N951 Menopausal and female climacteric states: Secondary | ICD-10-CM | POA: Insufficient documentation

## 2006-05-25 DIAGNOSIS — M6210 Other rupture of muscle (nontraumatic), unspecified site: Secondary | ICD-10-CM | POA: Insufficient documentation

## 2006-06-13 ENCOUNTER — Ambulatory Visit: Payer: Self-pay | Admitting: Internal Medicine

## 2006-06-13 ENCOUNTER — Encounter (INDEPENDENT_AMBULATORY_CARE_PROVIDER_SITE_OTHER): Payer: Self-pay | Admitting: Internal Medicine

## 2006-06-13 LAB — CONVERTED CEMR LAB
AST: 23 units/L (ref 0–37)
Alkaline Phosphatase: 82 units/L (ref 39–117)
Calcium: 9.7 mg/dL (ref 8.4–10.5)
Cholesterol: 204 mg/dL — ABNORMAL HIGH (ref 0–200)
HDL: 47 mg/dL (ref >39)
LDL Cholesterol: 102 mg/dL — ABNORMAL HIGH (ref 0–99)
Total Protein: 7.5 g/dL (ref 6.0–8.3)
Triglycerides: 277 mg/dL — ABNORMAL HIGH (ref <150)
VLDL: 55 mg/dL — ABNORMAL HIGH (ref 0–40)

## 2006-10-03 ENCOUNTER — Telehealth: Payer: Self-pay | Admitting: *Deleted

## 2007-01-23 ENCOUNTER — Emergency Department (HOSPITAL_COMMUNITY): Admission: EM | Admit: 2007-01-23 | Discharge: 2007-01-23 | Payer: Self-pay | Admitting: Emergency Medicine

## 2007-05-14 ENCOUNTER — Ambulatory Visit: Payer: Self-pay | Admitting: Hospitalist

## 2007-05-14 ENCOUNTER — Ambulatory Visit (HOSPITAL_COMMUNITY): Admission: RE | Admit: 2007-05-14 | Discharge: 2007-05-14 | Payer: Self-pay | Admitting: Hospitalist

## 2007-05-14 ENCOUNTER — Telehealth (INDEPENDENT_AMBULATORY_CARE_PROVIDER_SITE_OTHER): Payer: Self-pay | Admitting: *Deleted

## 2007-05-14 DIAGNOSIS — E785 Hyperlipidemia, unspecified: Secondary | ICD-10-CM | POA: Insufficient documentation

## 2007-05-14 DIAGNOSIS — M79609 Pain in unspecified limb: Secondary | ICD-10-CM | POA: Insufficient documentation

## 2007-05-14 DIAGNOSIS — R209 Unspecified disturbances of skin sensation: Secondary | ICD-10-CM | POA: Insufficient documentation

## 2007-05-14 IMAGING — CR DG SHOULDER 2+V*R*
3 series · 3 of 3 positions shown · non-contrast
Comparison: none

CLINICAL DATA: Neck pain, right shoulder and arm pain and weakness. 
CERVICAL SPINE ? 6 VIEW ? [DATE]:

[w shoulder ap internal righ]
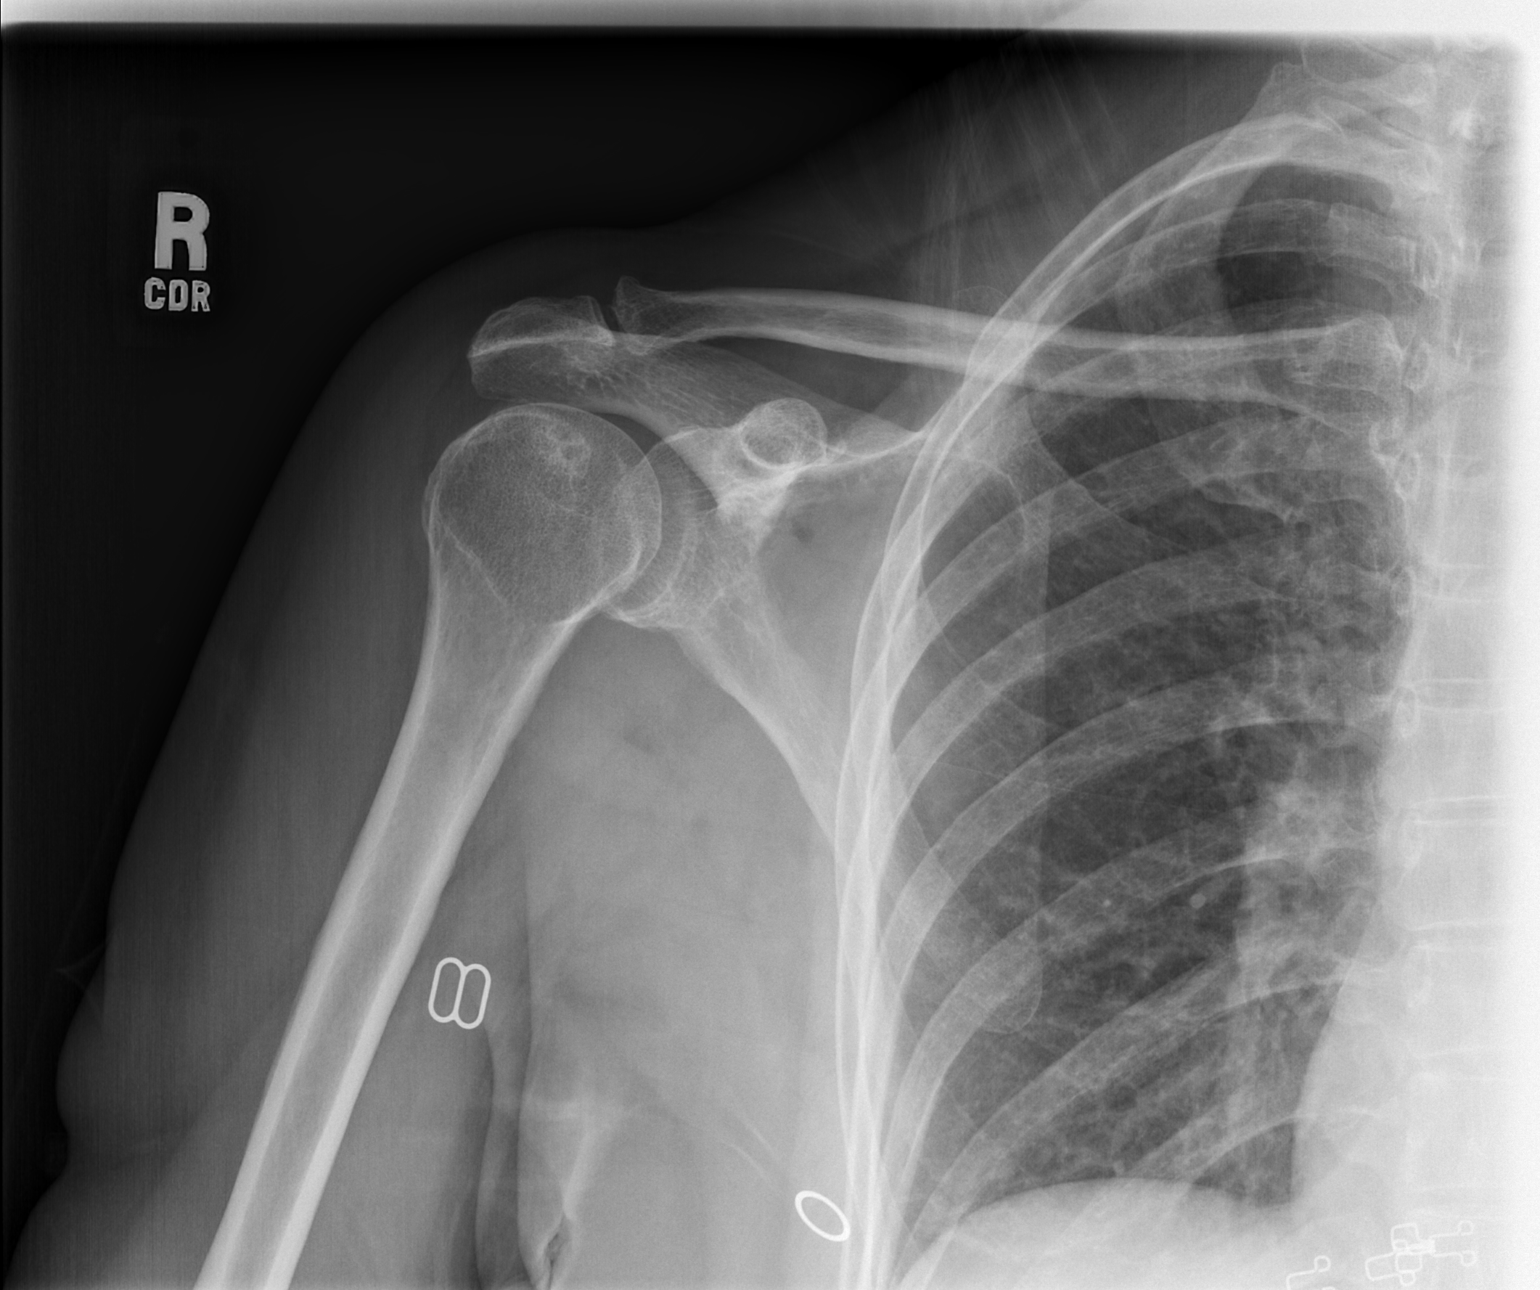

[w shoulder ap external righ]
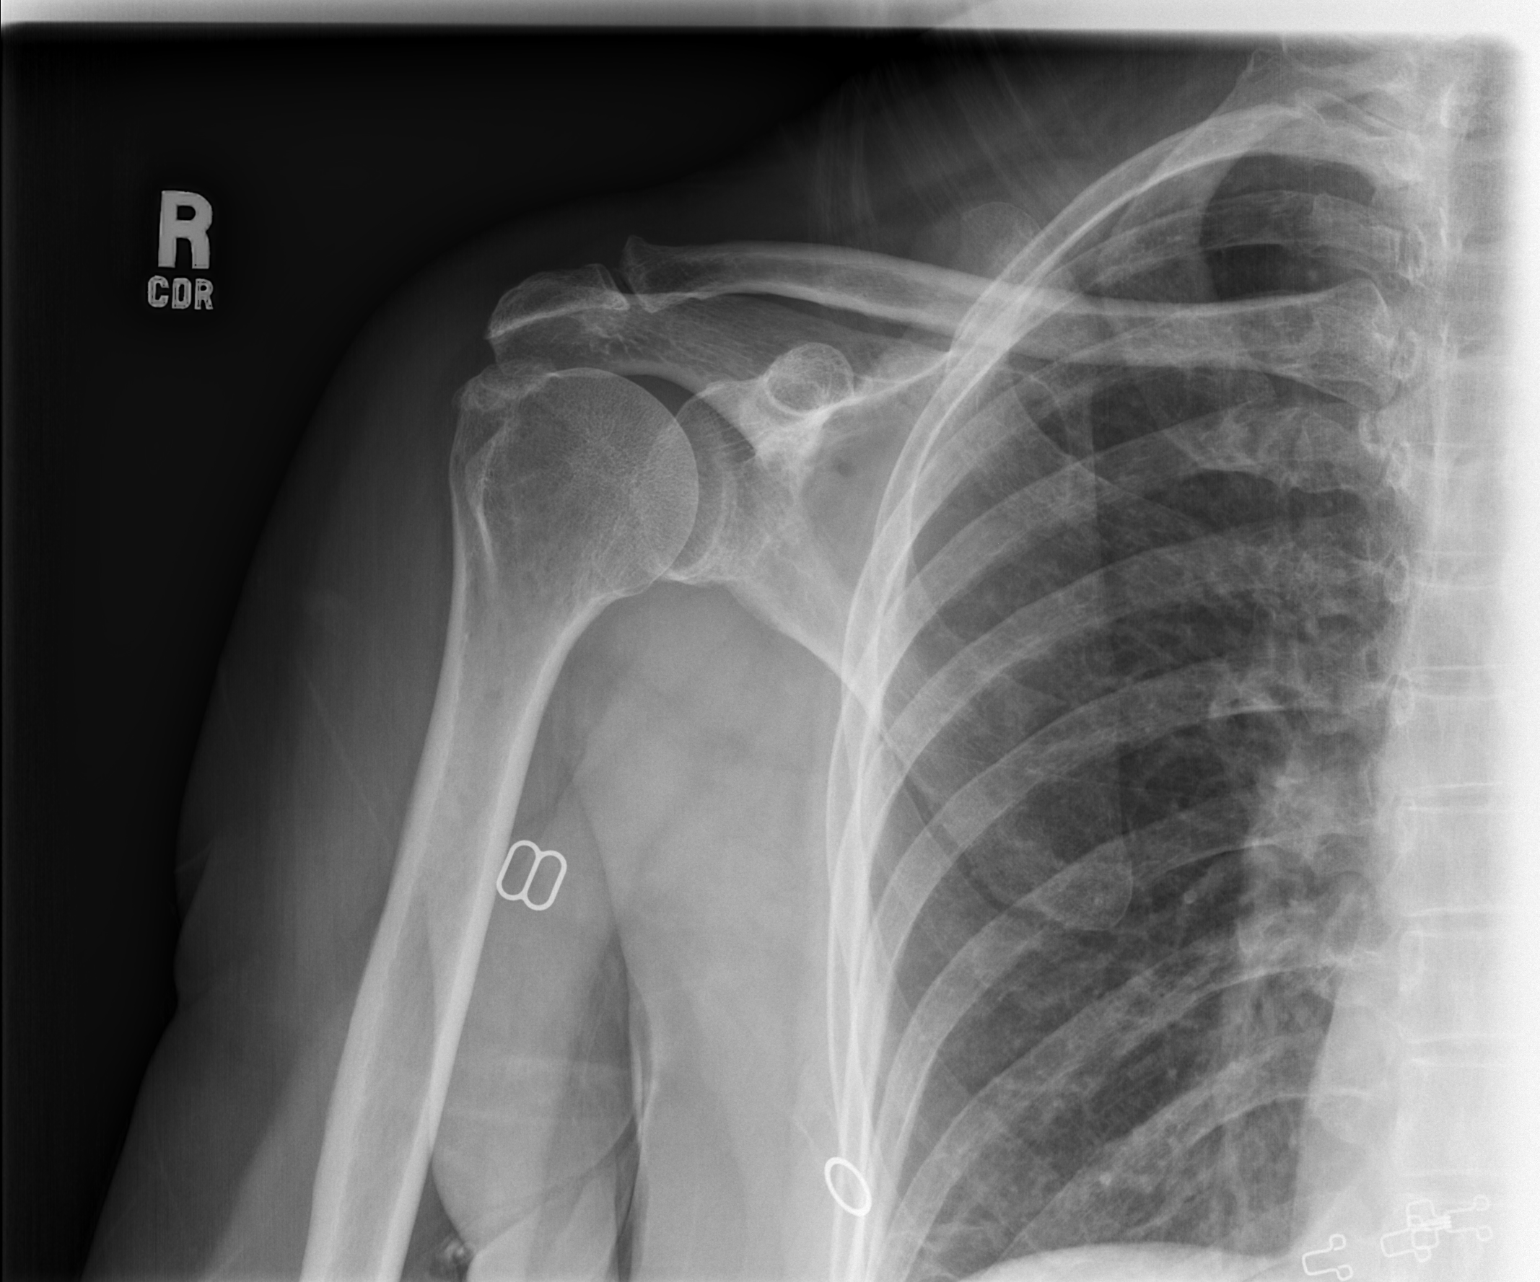

[w shoulder y view right]
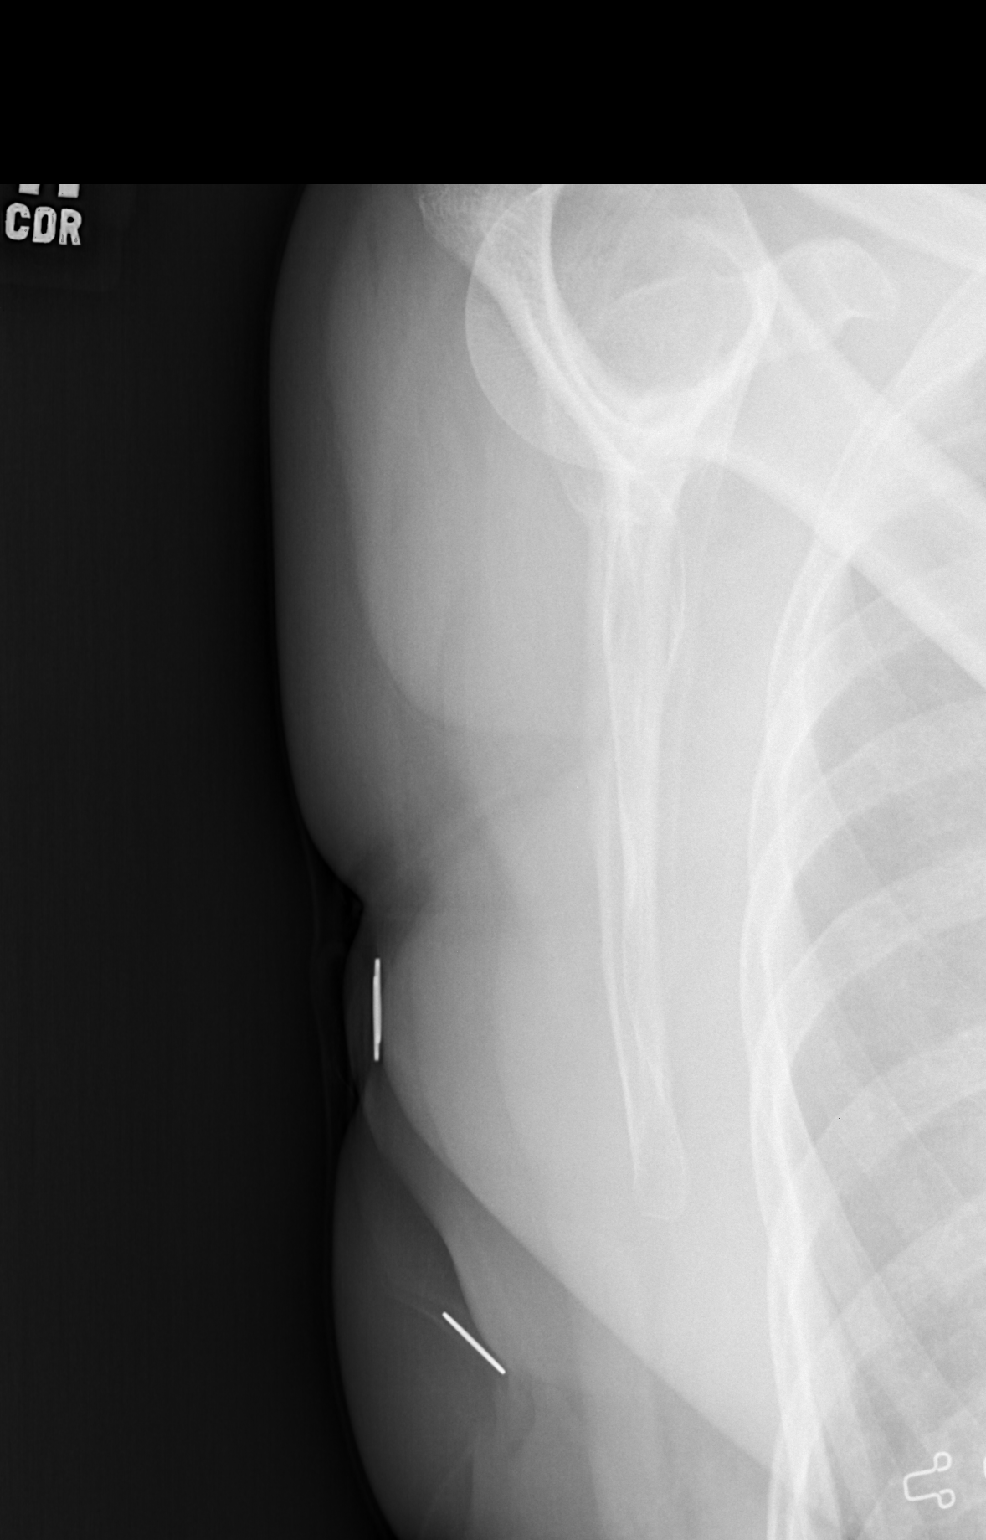

[3 of 3 positions shown; findings below may reference images not displayed]

FINDINGS: space narrowing and osteophytic formation from C4-5 to C7-T1.  Minimal retrolisthesis of C4 on C5 by 2-3 mm.  Degenerative facet arthropathy with facet hypertrophy is mainly noted on the right at C4-5 and C5-6.  Degenerative changes of the uncovertebral joints mainly on the right at C4-5 and C5-6 with osteophytic formation encroaching on the foramina. Foraminal stenosis is present on the right at C4-5 and C5-6 and possibly on the right at C3-4 and is present on the left at C4-5 and at least to a slight degree bilaterally at C7-T1.
IMPRESSION: Advanced degenerative spondylosis, multilevel ? see comments above. 
RIGHT SHOULDER ? 3 VIEW:
FINDINGS: Mild degenerative changes of the AC joint with bony hypertrophy.  Lateral downward curvature of the acromion.  Hypertrophy of the greater humeral tuberosity with cystic changes and sclerosis.   This may be a result of chronic rotator cuff disease.
IMPRESSION: Degenerative changes, right AC joint and greater humeral tuberosity changes suspicious for chronic rotator cuff disease.

## 2007-05-14 IMAGING — CR DG CERVICAL SPINE COMPLETE 4+V
6 series · 6 of 6 positions shown · non-contrast
Comparison: none

CLINICAL DATA: Neck pain, right shoulder and arm pain and weakness. 
CERVICAL SPINE ? 6 VIEW ? [DATE]:

[w c-spine lat]
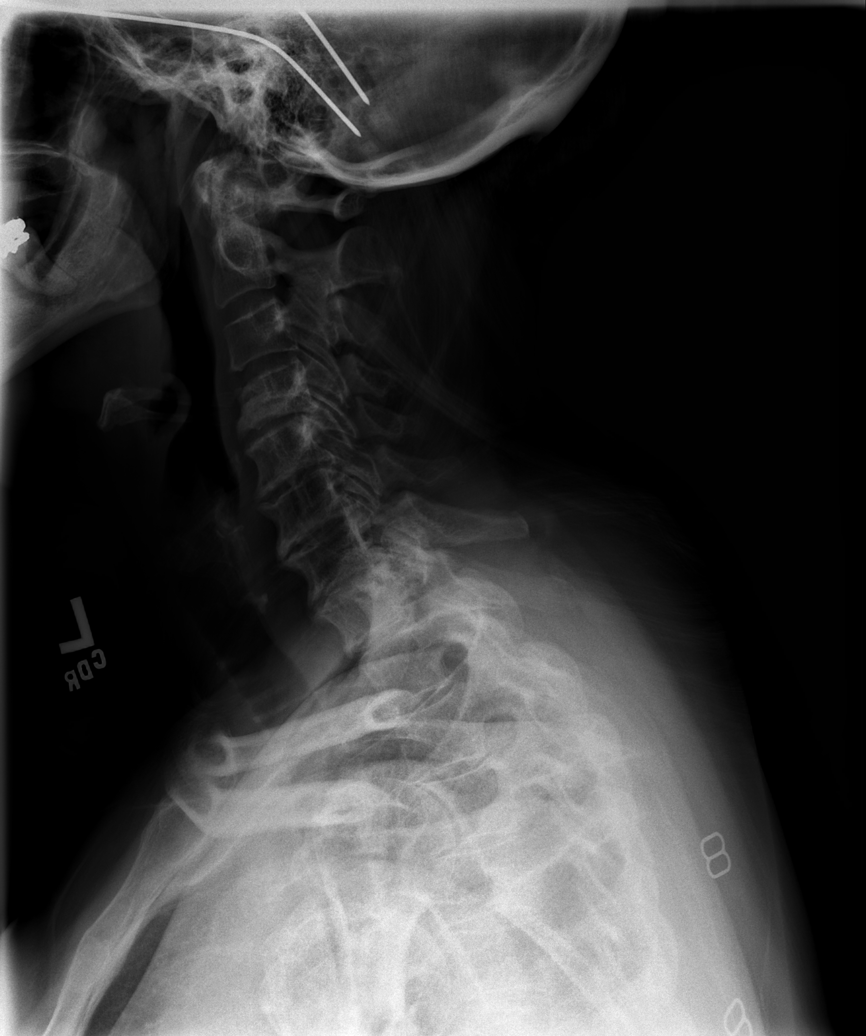

[w c-spine oblique (1 of 2)]
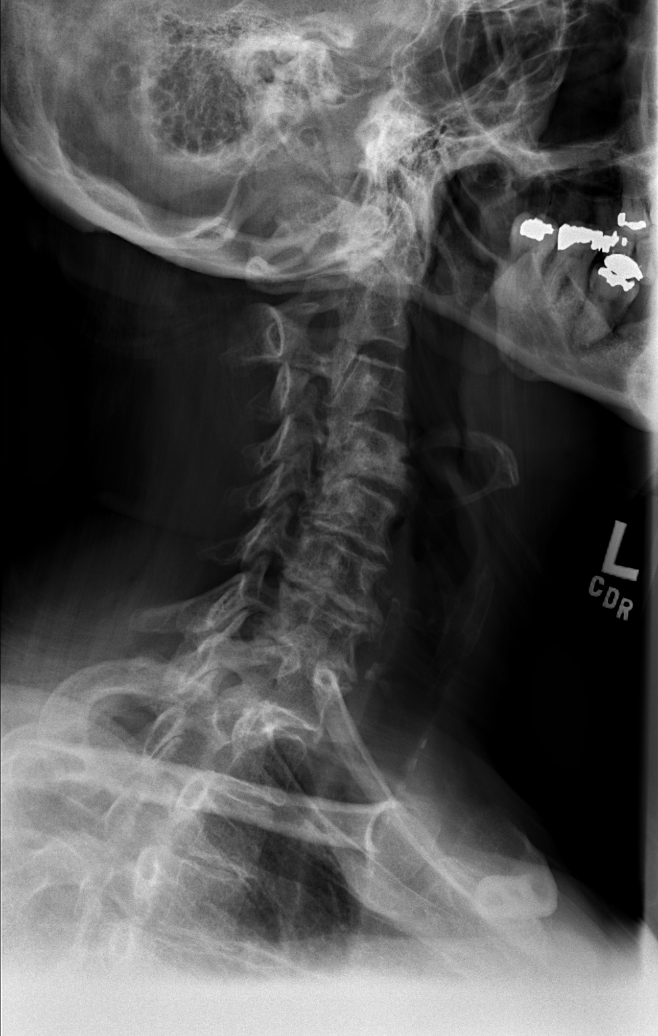

[w c-spine oblique (2 of 2)]
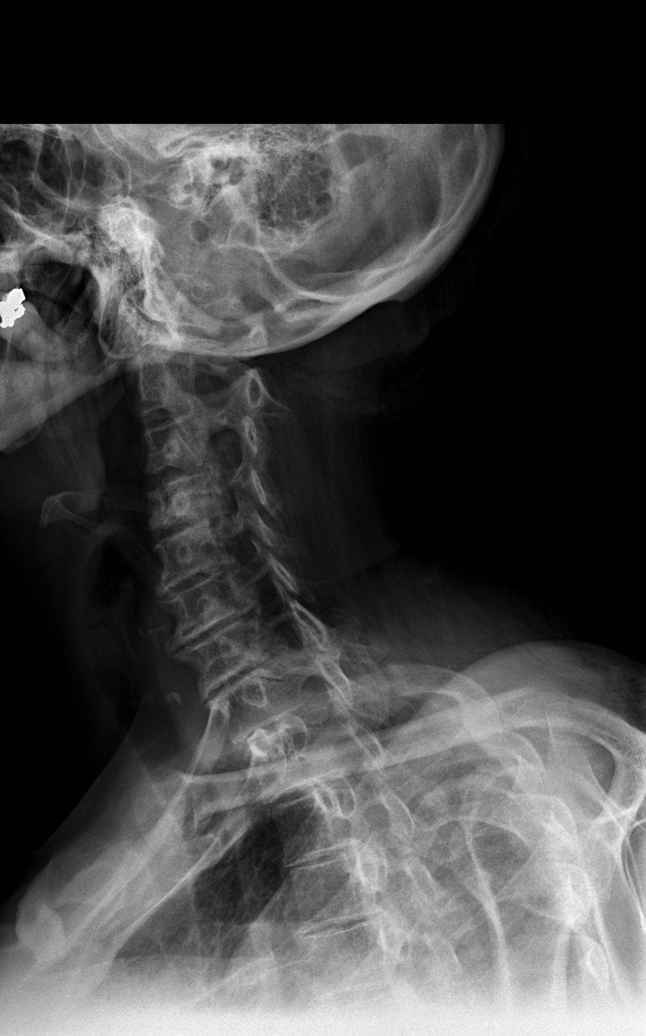

[w c-spine a.p.]
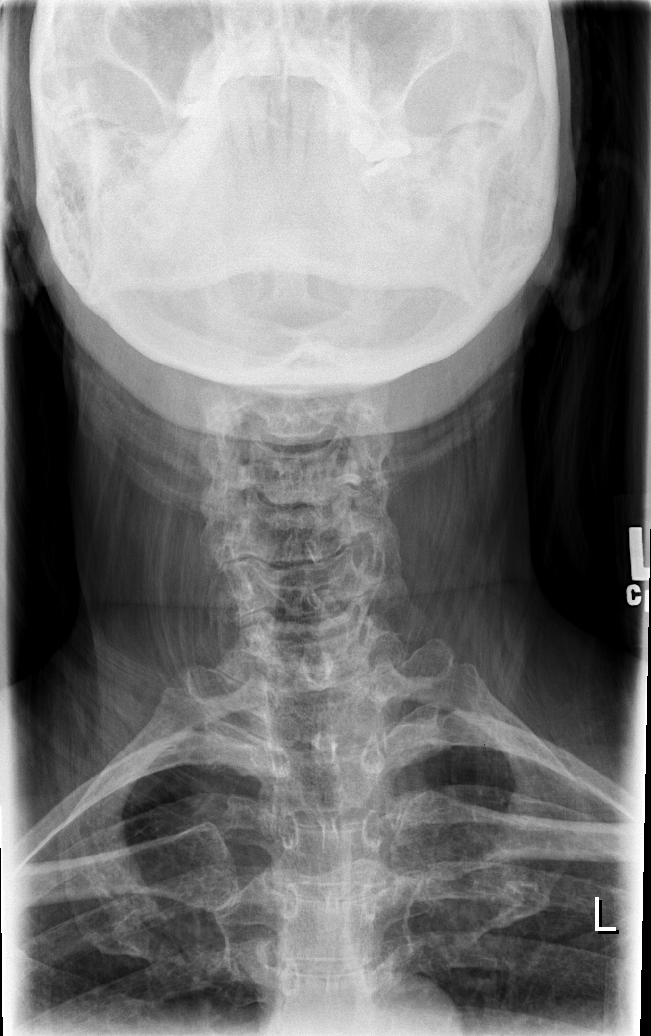

[w c-spine odontoid]
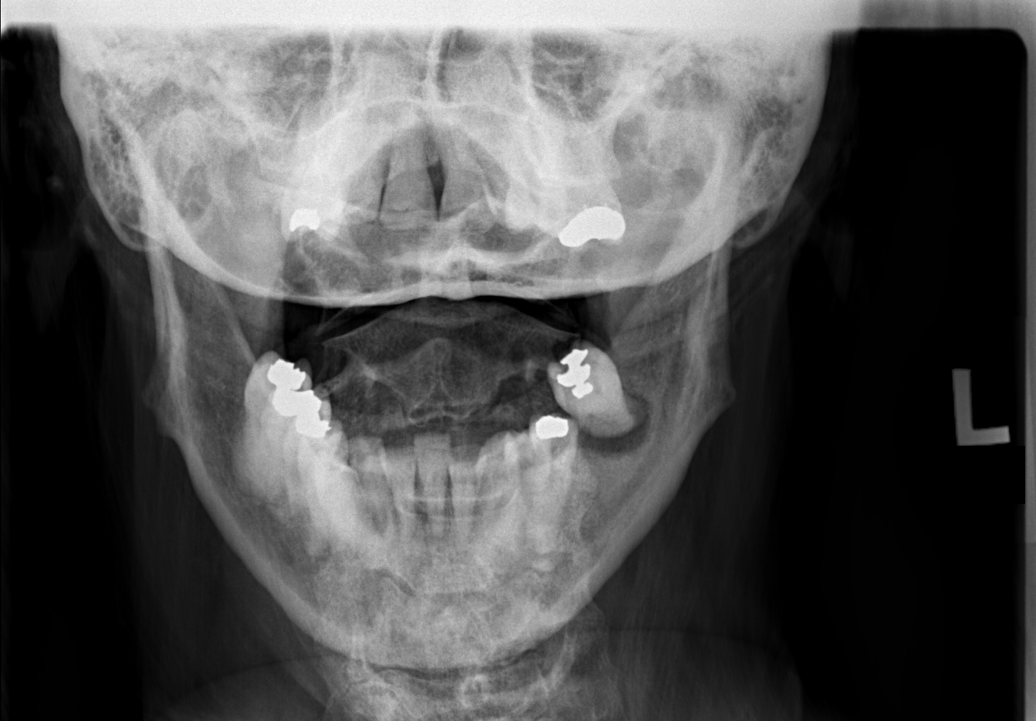

[w c-spine odontoid *]
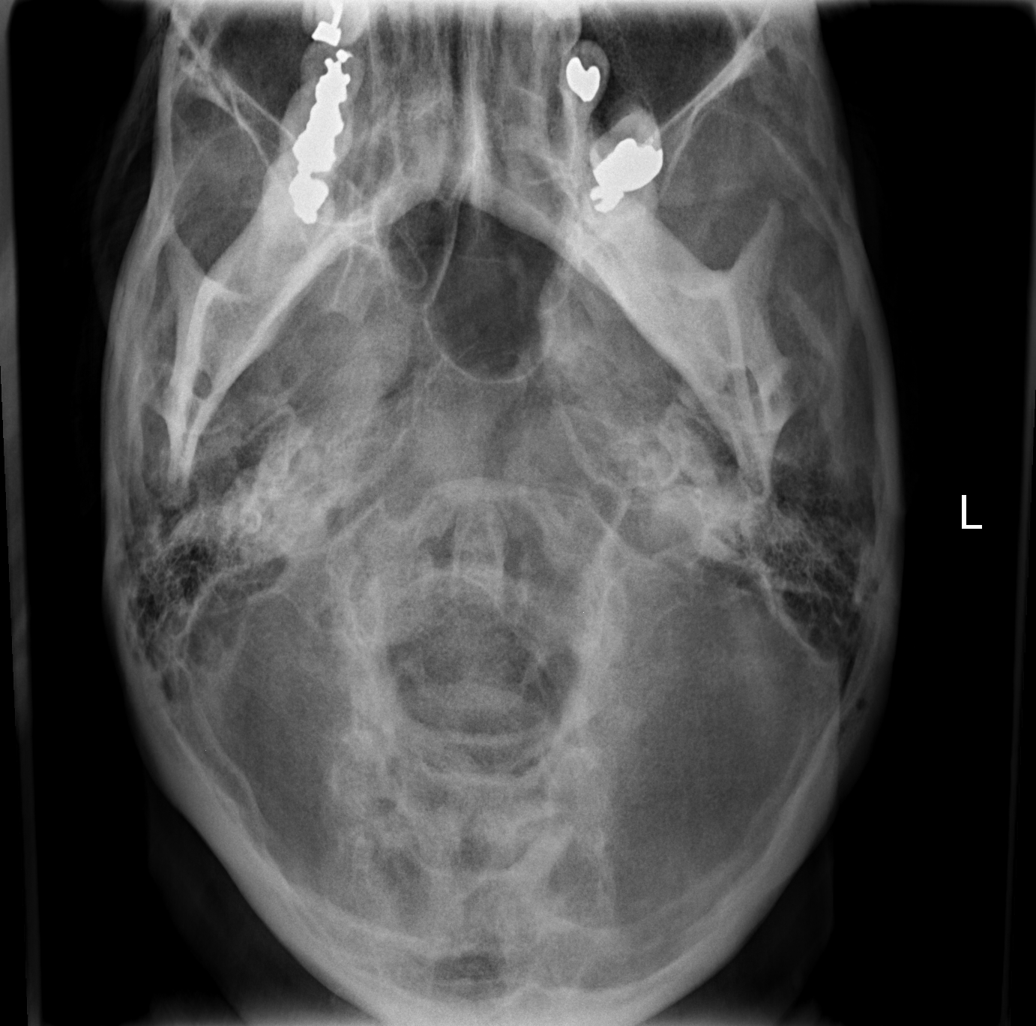

[6 of 6 positions shown; findings below may reference images not displayed]

FINDINGS: space narrowing and osteophytic formation from C4-5 to C7-T1.  Minimal retrolisthesis of C4 on C5 by 2-3 mm.  Degenerative facet arthropathy with facet hypertrophy is mainly noted on the right at C4-5 and C5-6.  Degenerative changes of the uncovertebral joints mainly on the right at C4-5 and C5-6 with osteophytic formation encroaching on the foramina. Foraminal stenosis is present on the right at C4-5 and C5-6 and possibly on the right at C3-4 and is present on the left at C4-5 and at least to a slight degree bilaterally at C7-T1.
IMPRESSION: Advanced degenerative spondylosis, multilevel ? see comments above. 
RIGHT SHOULDER ? 3 VIEW:
FINDINGS: Mild degenerative changes of the AC joint with bony hypertrophy.  Lateral downward curvature of the acromion.  Hypertrophy of the greater humeral tuberosity with cystic changes and sclerosis.   This may be a result of chronic rotator cuff disease.
IMPRESSION: Degenerative changes, right AC joint and greater humeral tuberosity changes suspicious for chronic rotator cuff disease.

## 2007-12-13 ENCOUNTER — Ambulatory Visit: Payer: Self-pay | Admitting: Internal Medicine

## 2007-12-13 ENCOUNTER — Encounter (INDEPENDENT_AMBULATORY_CARE_PROVIDER_SITE_OTHER): Payer: Self-pay | Admitting: Internal Medicine

## 2007-12-13 DIAGNOSIS — M25519 Pain in unspecified shoulder: Secondary | ICD-10-CM | POA: Insufficient documentation

## 2007-12-13 LAB — CONVERTED CEMR LAB
AST: 21 units/L (ref 0–37)
Albumin: 4.7 g/dL (ref 3.5–5.2)
Calcium: 9.9 mg/dL (ref 8.4–10.5)
Potassium: 4.4 meq/L (ref 3.5–5.3)
Total Bilirubin: 0.4 mg/dL (ref 0.3–1.2)

## 2007-12-27 ENCOUNTER — Encounter (INDEPENDENT_AMBULATORY_CARE_PROVIDER_SITE_OTHER): Payer: Self-pay | Admitting: *Deleted

## 2007-12-27 ENCOUNTER — Ambulatory Visit: Payer: Self-pay | Admitting: Internal Medicine

## 2008-01-02 ENCOUNTER — Encounter (INDEPENDENT_AMBULATORY_CARE_PROVIDER_SITE_OTHER): Payer: Self-pay | Admitting: *Deleted

## 2008-01-02 ENCOUNTER — Ambulatory Visit: Payer: Self-pay | Admitting: Sports Medicine

## 2008-01-02 DIAGNOSIS — M719 Bursopathy, unspecified: Secondary | ICD-10-CM

## 2008-01-02 DIAGNOSIS — M67919 Unspecified disorder of synovium and tendon, unspecified shoulder: Secondary | ICD-10-CM | POA: Insufficient documentation

## 2008-01-16 ENCOUNTER — Ambulatory Visit: Payer: Self-pay | Admitting: Sports Medicine

## 2008-02-16 ENCOUNTER — Emergency Department (HOSPITAL_COMMUNITY): Admission: EM | Admit: 2008-02-16 | Discharge: 2008-02-16 | Payer: Self-pay | Admitting: Emergency Medicine

## 2008-02-25 ENCOUNTER — Ambulatory Visit: Payer: Self-pay | Admitting: Internal Medicine

## 2008-02-25 DIAGNOSIS — K089 Disorder of teeth and supporting structures, unspecified: Secondary | ICD-10-CM | POA: Insufficient documentation

## 2008-02-25 DIAGNOSIS — J069 Acute upper respiratory infection, unspecified: Secondary | ICD-10-CM | POA: Insufficient documentation

## 2008-04-02 ENCOUNTER — Emergency Department (HOSPITAL_COMMUNITY): Admission: EM | Admit: 2008-04-02 | Discharge: 2008-04-02 | Payer: Self-pay | Admitting: Emergency Medicine

## 2008-04-02 IMAGING — CR DG CHEST 2V
2 series · 2 of 2 positions shown · non-contrast
Comparison: [DATE]

CLINICAL DATA: Short of breath

CHEST - 2 VIEW

[w chest pa]
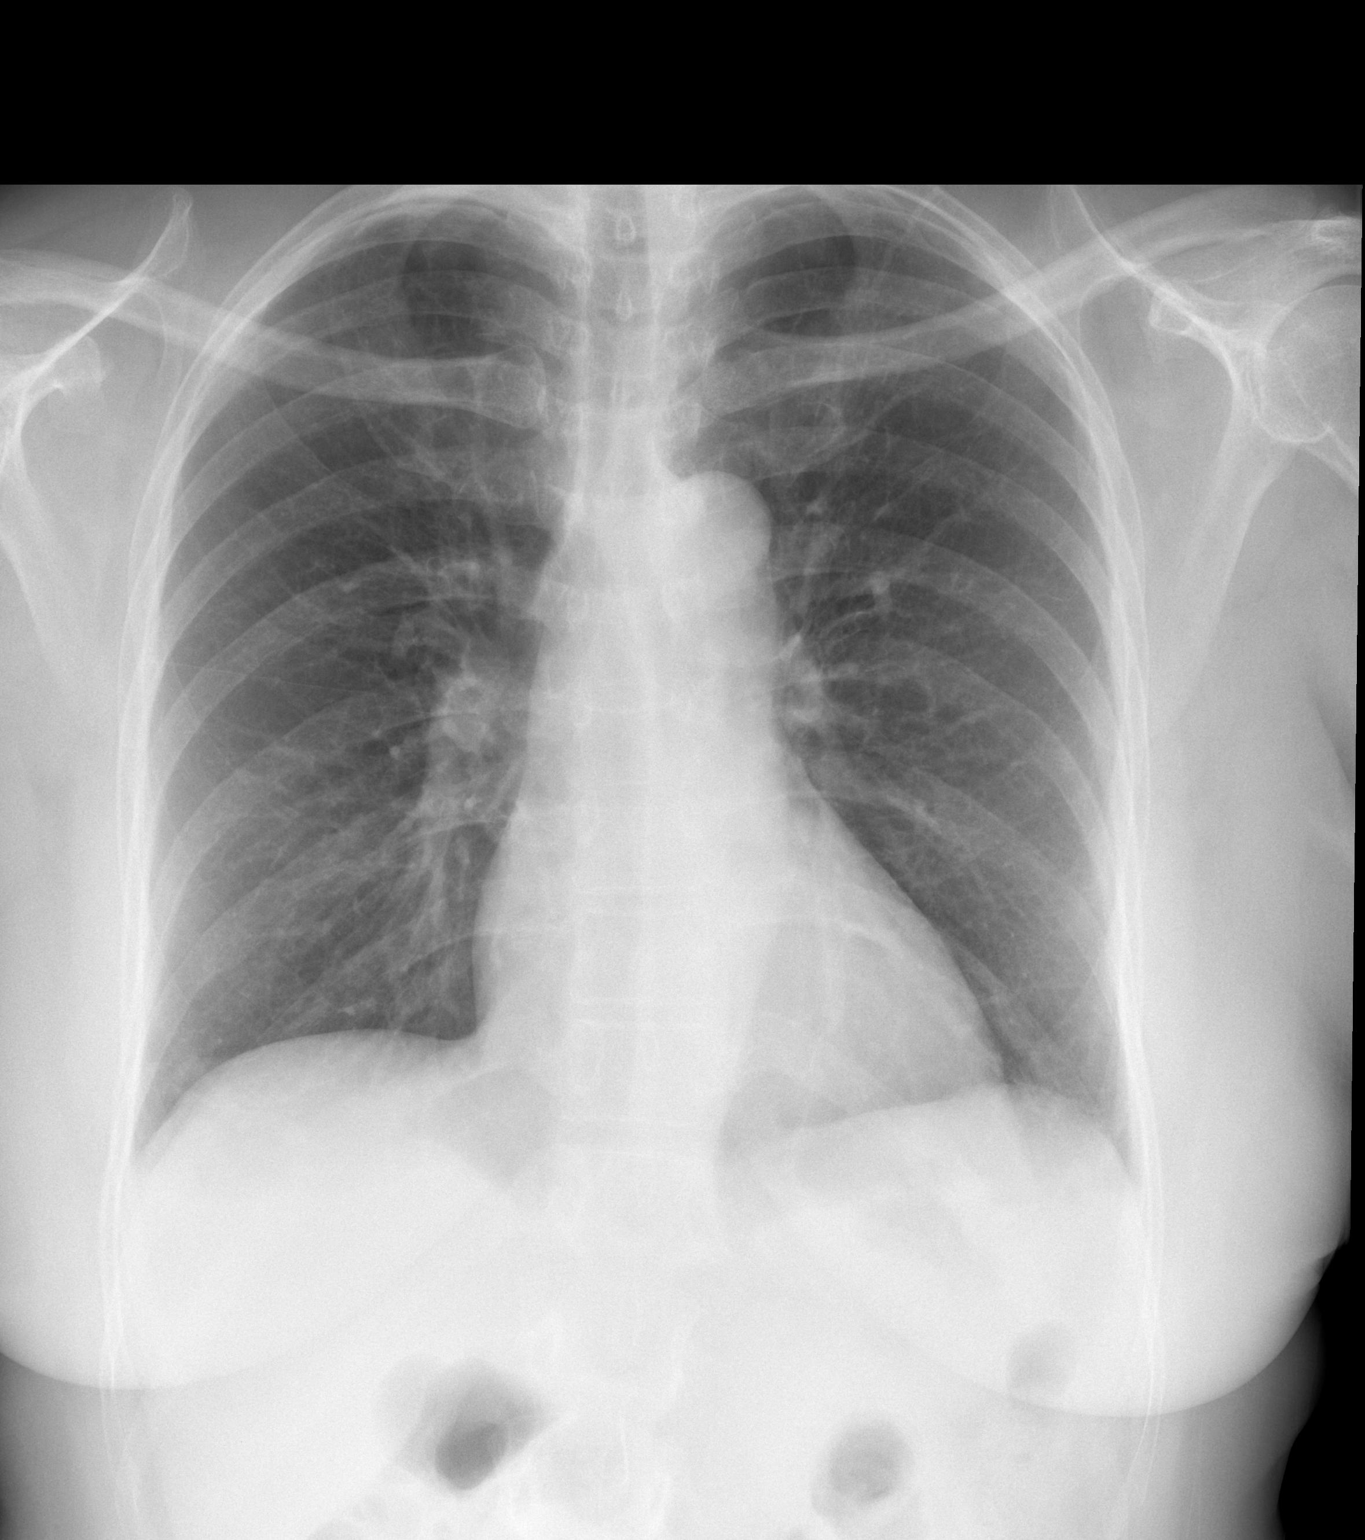

[w chest lat]
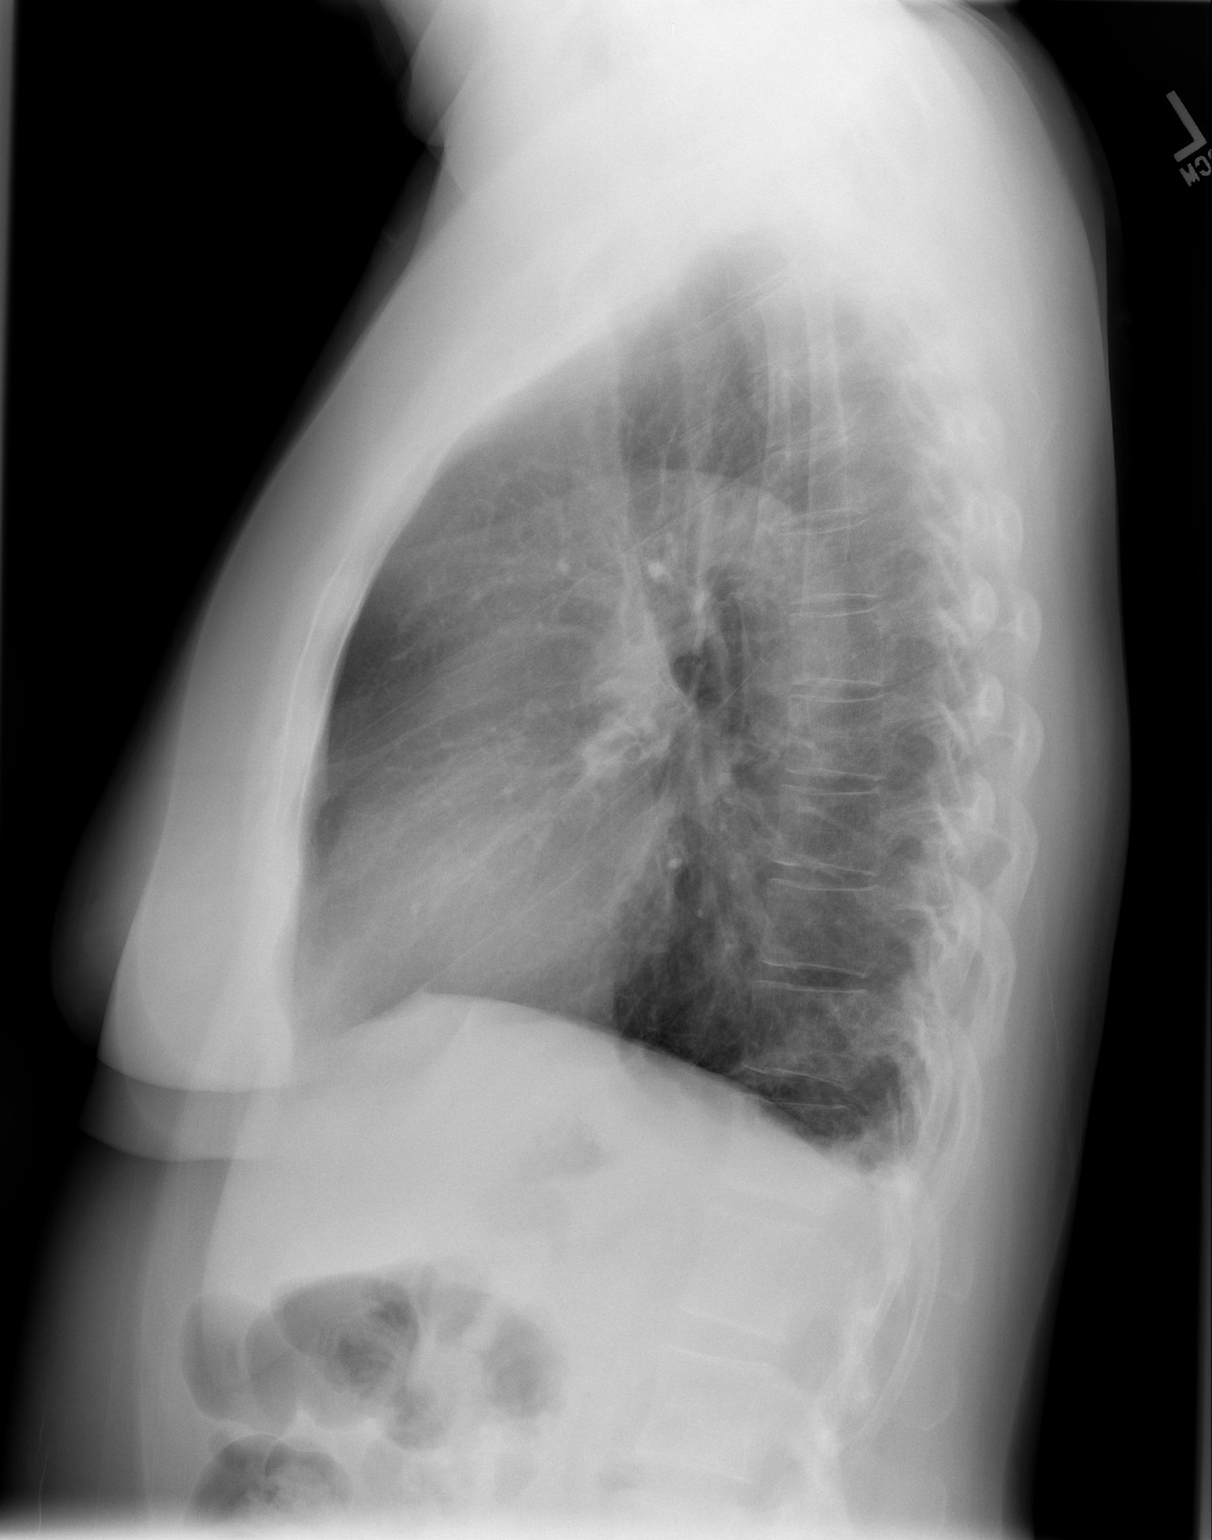

[2 of 2 positions shown; findings below may reference images not displayed]

FINDINGS: Heart and mediastinal contours normal.  Lungs clear.  No
pleural fluid or osseous lesions.
IMPRESSION: No active disease.

## 2008-05-08 ENCOUNTER — Ambulatory Visit: Payer: Self-pay | Admitting: Internal Medicine

## 2008-05-08 DIAGNOSIS — J45909 Unspecified asthma, uncomplicated: Secondary | ICD-10-CM | POA: Insufficient documentation

## 2008-05-09 ENCOUNTER — Encounter (INDEPENDENT_AMBULATORY_CARE_PROVIDER_SITE_OTHER): Payer: Self-pay | Admitting: Internal Medicine

## 2008-05-09 ENCOUNTER — Telehealth (INDEPENDENT_AMBULATORY_CARE_PROVIDER_SITE_OTHER): Payer: Self-pay | Admitting: Internal Medicine

## 2008-05-15 LAB — CONVERTED CEMR LAB
ALT: 37 units/L — ABNORMAL HIGH (ref 0–35)
Albumin: 4.5 g/dL (ref 3.5–5.2)
Basophils Relative: 1 % (ref 0–1)
Creatinine, Ser: 1 mg/dL (ref 0.40–1.20)
Eosinophils Absolute: 0.1 10*3/uL (ref 0.0–0.7)
HCT: 39.7 % (ref 36.0–46.0)
LDL Cholesterol: 135 mg/dL — ABNORMAL HIGH (ref 0–99)
Lymphocytes Relative: 46 % (ref 12–46)
Lymphs Abs: 2.8 10*3/uL (ref 0.7–4.0)
MCHC: 33.2 g/dL (ref 30.0–36.0)
Monocytes Absolute: 0.3 10*3/uL (ref 0.1–1.0)
Monocytes Relative: 6 % (ref 3–12)
Platelets: 303 10*3/uL (ref 150–400)
Potassium: 3.9 meq/L (ref 3.5–5.3)
RDW: 12.6 % (ref 11.5–15.5)
Sodium: 138 meq/L (ref 135–145)
Total CHOL/HDL Ratio: 4.3
Total Protein: 7.7 g/dL (ref 6.0–8.3)
Triglycerides: 143 mg/dL (ref <150)
VLDL: 29 mg/dL (ref 0–40)

## 2008-05-20 ENCOUNTER — Encounter (INDEPENDENT_AMBULATORY_CARE_PROVIDER_SITE_OTHER): Payer: Self-pay | Admitting: Internal Medicine

## 2008-05-20 ENCOUNTER — Ambulatory Visit (HOSPITAL_COMMUNITY): Admission: RE | Admit: 2008-05-20 | Discharge: 2008-05-20 | Payer: Self-pay | Admitting: Internal Medicine

## 2008-05-22 ENCOUNTER — Encounter (INDEPENDENT_AMBULATORY_CARE_PROVIDER_SITE_OTHER): Payer: Self-pay | Admitting: Internal Medicine

## 2008-05-26 LAB — CONVERTED CEMR LAB
ALT: 37 units/L — ABNORMAL HIGH (ref 0–35)
AST: 32 units/L (ref 0–37)
BUN: 11 mg/dL (ref 6–23)
Basophils Absolute: 0 10*3/uL (ref 0.0–0.1)
Basophils Relative: 1 % (ref 0–1)
Cholesterol: 213 mg/dL — ABNORMAL HIGH (ref 0–200)
HCT: 39.7 % (ref 36.0–46.0)
Hemoglobin: 13.2 g/dL (ref 12.0–15.0)
Lymphocytes Relative: 46 % (ref 12–46)
Lymphs Abs: 2.8 10*3/uL (ref 0.7–4.0)
MCV: 93.4 fL (ref 78.0–100.0)
Monocytes Absolute: 0.3 10*3/uL (ref 0.1–1.0)
Monocytes Relative: 6 % (ref 3–12)
Neutro Abs: 2.9 10*3/uL (ref 1.7–7.7)
Neutrophils Relative %: 47 % (ref 43–77)
RDW: 12.6 % (ref 11.5–15.5)
Sodium: 138 meq/L (ref 135–145)
Total CHOL/HDL Ratio: 4.3

## 2008-06-20 ENCOUNTER — Ambulatory Visit: Payer: Self-pay | Admitting: Internal Medicine

## 2008-08-14 ENCOUNTER — Ambulatory Visit: Payer: Self-pay | Admitting: Sports Medicine

## 2008-09-25 ENCOUNTER — Telehealth (INDEPENDENT_AMBULATORY_CARE_PROVIDER_SITE_OTHER): Payer: Self-pay | Admitting: Internal Medicine

## 2008-11-26 ENCOUNTER — Telehealth (INDEPENDENT_AMBULATORY_CARE_PROVIDER_SITE_OTHER): Payer: Self-pay | Admitting: Internal Medicine

## 2009-06-23 ENCOUNTER — Telehealth (INDEPENDENT_AMBULATORY_CARE_PROVIDER_SITE_OTHER): Payer: Self-pay | Admitting: Internal Medicine

## 2009-06-23 ENCOUNTER — Ambulatory Visit: Payer: Self-pay | Admitting: Internal Medicine

## 2010-01-26 ENCOUNTER — Ambulatory Visit: Payer: Self-pay | Admitting: Internal Medicine

## 2010-01-26 DIAGNOSIS — M545 Low back pain, unspecified: Secondary | ICD-10-CM | POA: Insufficient documentation

## 2010-02-08 ENCOUNTER — Telehealth: Payer: Self-pay | Admitting: Internal Medicine

## 2010-02-08 ENCOUNTER — Ambulatory Visit: Payer: Self-pay | Admitting: Internal Medicine

## 2010-02-08 DIAGNOSIS — M6281 Muscle weakness (generalized): Secondary | ICD-10-CM | POA: Insufficient documentation

## 2010-02-08 LAB — CONVERTED CEMR LAB
ALT: 20 units/L (ref 0–35)
AST: 25 units/L (ref 0–37)
Alkaline Phosphatase: 84 units/L (ref 39–117)
CO2: 23 meq/L (ref 19–32)
Chloride: 97 meq/L (ref 96–112)
Glucose, Bld: 109 mg/dL — ABNORMAL HIGH (ref 70–99)
MCHC: 35 g/dL (ref 30.0–36.0)
MCV: 89.6 fL (ref >=78.0)
Magnesium: 2.1 mg/dL (ref 1.5–2.5)
Potassium: 4 meq/L (ref 3.5–5.3)
RBC: 4.82 M/uL (ref 3.87–5.11)
RDW: 12 % (ref 11.5–15.5)
Sed Rate: 17 mm/hr (ref 0–22)
Sodium: 136 meq/L (ref 135–145)
WBC: 7.6 10*3/uL (ref 4.0–10.5)

## 2010-02-15 ENCOUNTER — Ambulatory Visit: Payer: Self-pay | Admitting: Internal Medicine

## 2010-02-15 ENCOUNTER — Encounter: Payer: Self-pay | Admitting: Internal Medicine

## 2010-02-15 ENCOUNTER — Ambulatory Visit (HOSPITAL_COMMUNITY): Admission: RE | Admit: 2010-02-15 | Discharge: 2010-02-15 | Payer: Self-pay | Admitting: Internal Medicine

## 2010-02-15 DIAGNOSIS — R5383 Other fatigue: Secondary | ICD-10-CM

## 2010-02-15 DIAGNOSIS — R5381 Other malaise: Secondary | ICD-10-CM | POA: Insufficient documentation

## 2010-02-16 ENCOUNTER — Encounter: Payer: Self-pay | Admitting: Internal Medicine

## 2010-02-16 ENCOUNTER — Telehealth: Payer: Self-pay | Admitting: Internal Medicine

## 2010-02-19 ENCOUNTER — Ambulatory Visit: Payer: Self-pay | Admitting: Internal Medicine

## 2010-02-19 DIAGNOSIS — I951 Orthostatic hypotension: Secondary | ICD-10-CM | POA: Insufficient documentation

## 2010-02-19 DIAGNOSIS — R609 Edema, unspecified: Secondary | ICD-10-CM | POA: Insufficient documentation

## 2010-02-24 ENCOUNTER — Encounter: Payer: Self-pay | Admitting: Internal Medicine

## 2010-05-27 ENCOUNTER — Telehealth (INDEPENDENT_AMBULATORY_CARE_PROVIDER_SITE_OTHER): Payer: Self-pay | Admitting: *Deleted

## 2010-06-23 ENCOUNTER — Ambulatory Visit: Payer: Self-pay | Admitting: Internal Medicine

## 2010-07-08 ENCOUNTER — Ambulatory Visit: Payer: Self-pay | Admitting: Internal Medicine

## 2010-07-08 DIAGNOSIS — R519 Headache, unspecified: Secondary | ICD-10-CM | POA: Insufficient documentation

## 2010-07-08 DIAGNOSIS — R51 Headache: Secondary | ICD-10-CM | POA: Insufficient documentation

## 2010-08-03 ENCOUNTER — Ambulatory Visit: Admission: RE | Admit: 2010-08-03 | Discharge: 2010-08-03 | Payer: Self-pay | Source: Home / Self Care

## 2010-08-03 ENCOUNTER — Encounter: Payer: Self-pay | Admitting: Internal Medicine

## 2010-08-03 LAB — CONVERTED CEMR LAB
BUN: 14 mg/dL (ref 6–23)
CO2: 29 meq/L (ref 19–32)
Calcium: 9.1 mg/dL (ref 8.4–10.5)
Chloride: 105 meq/L (ref 96–112)
Glucose, Bld: 111 mg/dL — ABNORMAL HIGH (ref 70–99)
Sodium: 140 meq/L (ref 135–145)

## 2010-08-17 ENCOUNTER — Ambulatory Visit: Admit: 2010-08-17 | Payer: Self-pay

## 2010-08-19 NOTE — Progress Notes (Signed)
Summary: PREVENTIVE COLONOSCOPY  Phone Note Outgoing Call   Summary of Call: Found last Colonoscopy procedure done on Friday March 03, 2006 performed by Dr, Milana [facility].  Information was found the old paper chart. Information was given to the nurse for documentation. Initial call taken by: Shon Hough,  May 27, 2010 8:53 AM

## 2010-08-19 NOTE — Assessment & Plan Note (Signed)
Summary: FLU SHOT/CFB  Nurse Visit   Allergies: 1)  ! Pcn 2)  ! Sulfa  Orders Added: 1)  Admin 1st Vaccine [90471] 2)  Flu Vaccine 26yrs + [16109] Flu Vaccine Consent Questions     Do you have a history of severe allergic reactions to this vaccine? no    Any prior history of allergic reactions to egg and/or gelatin? no    Do you have a sensitivity to the preservative Thimersol? no    Do you have a past history of Guillan-Barre Syndrome? no    Do you currently have an acute febrile illness? no    Have you ever had a severe reaction to latex? no    Vaccine information given and explained to patient? yes    Are you currently pregnant? no    Lot Number:[id number]   Exp Date:01/15/2011   Manufacturer: [employer]    Site Given  Left Deltoid IM Vaccine 61yrs + [id number] .opcflu

## 2010-08-19 NOTE — Assessment & Plan Note (Signed)
Summary: ACUTE-HIGH BLOOD PRESSURE/DIZZY/([facility])/CFB   Vital Signs:  Patient profile:   60 year old female Height:      66 inches Weight:      180.7 pounds BMI:     29.27 Temp:     97.3 degrees F oral Pulse rate:   84 / minute BP sitting:   158 / 94  (right arm)  Vitals Entered By: Filomena Jungling NT II (January 26, 2010 2:10 PM) CC: PATIENT NEED REFILLS OUT OF MEDICINE Is Patient Diabetic? No Pain Assessment Patient in pain? yes     Location: head Intensity: 8 Type: aching Onset of pain  4 days Nutritional Status BMI of 25 - 29 = overweight  Have you ever been in a relationship where you felt threatened, hurt or afraid?No   Does patient need assistance? Functional Status Self care Ambulation Normal   Primary Provider:  Elby Showers MD  CC:  PATIENT NEED REFILLS OUT OF MEDICINE.  History of Present Illness: Pt is a 60 y/o woman with P/H of HTN, Hyperlipidemia, migraine headaches. She comes to the clinic withthe following complaint.  1) acute increase in BP- since last 3 days. Pt was on meds for her BP, but due to money issue, she was not able to take her meds for last 12 months. She did check her BP at her home with the electronic meter for about 1-2 times a week. She didn't had any problems with her BP until last 3 days, when she found one of her BP reading shooting upto 160/96. She used to eat low salt diet regularly, but in last month , they had many family gatherings due to which she was not able to control her salt intake.   2) Headache , Dizziness- since last 3-4 days.      Headache- She feels dull tightness in her frontal, temporal and occipital area, like a band.  It has been the same since the time it started. She hasn't taken anything to relieve it.     Dizziness- She feels dizzy since last 3 days, when she is out of bed and doing any work.  Denies any fall.  She has swelling in her legs which is more in the evening and night and gets better by leg elevation at  night. No pain in legs.   Problems Prior to Update: 1)  Health Screening  (ICD-V70.0) 2)  Asthma  (ICD-493.90) 3)  Uri  (ICD-465.9) 4)  Dental Pain  (ICD-525.9) 5)  Rotator Cuff Syndrome, Left  (ICD-726.10) 6)  Shoulder Pain, Left  (ICD-719.41) 7)  Hyperlipidemia Nec/nos  (ICD-272.4) 8)  Arm Pain  (ICD-729.5) 9)  Arm Numbness  (ICD-782.0) 10)  Muscle Rupture, Nontraumatic  (ICD-728.83) 11)  Menopausal Syndrome  (ICD-627.2) 12)  Low Back Pain Syndrome, Hx of  (ICD-V13.5) 13)  Migraines, Hx of  (ICD-V13.8) 14)  Hypertension  (ICD-401.9)  Medications Prior to Update: 1)  Hydrochlorothiazide 25 Mg Tabs (Hydrochlorothiazide) .... Take 1 Tablet By Mouth Once A Day 2)  Atenolol 100 Mg Tabs (Atenolol) .... Take 1 Tablet By Mouth Once A Day 3)  Lisinopril 10 Mg Tabs (Lisinopril) .... Take 1 Tablet By Mouth Once A Day 4)  Catapres 0.1 Mg Tabs (Clonidine Hcl) .... Take One Tablet Daily At Bedtime. 5)  Fluticasone Propionate 0.05 % Crea (Fluticasone Propionate) .... Two Times A Day  As Needed 6)  Pravachol 80 Mg Tabs (Pravastatin Sodium) .... Take One Tablet Daily. 7)  Advair Diskus 100-50 Mcg/dose Misc (Fluticasone-Salmeterol) .Marland KitchenMarland KitchenMarland Kitchen  Use Once Daily To Prevent Asthma Exacerbation. 8)  Ventolin Hfa 108 (90 Base) Mcg/act Aers (Albuterol Sulfate) .... Use 2 Puffs Every 2-4 Hours Only When Needed For Shortness of Breath. 9)  Fluoxetine Hcl 10 Mg Caps (Fluoxetine Hcl) .... Take One Tablet Daily For Hot Flashes.  Current Medications (verified): 1)  Hydrochlorothiazide 25 Mg Tabs (Hydrochlorothiazide) .... Take 1 Tablet By Mouth Once A Day 2)  Atenolol 100 Mg Tabs (Atenolol) .... Take 1 Tablet By Mouth Once A Day 3)  Lisinopril 10 Mg Tabs (Lisinopril) .... Take 1 Tablet By Mouth Once A Day 4)  Catapres 0.1 Mg Tabs (Clonidine Hcl) .... Take One Tablet Daily At Bedtime. 5)  Pravachol 80 Mg Tabs (Pravastatin Sodium) .... Take One Tablet Daily. 6)  Advair Diskus 100-50 Mcg/dose Misc  (Fluticasone-Salmeterol) .... Use Once Daily To Prevent Asthma Exacerbation. 7)  Ventolin Hfa 108 (90 Base) Mcg/act Aers (Albuterol Sulfate) .... Use 2 Puffs Every 2-4 Hours Only When Needed For Shortness of Breath. 8)  Fluoxetine Hcl 10 Mg Caps (Fluoxetine Hcl) .... Take One Tablet Daily For Hot Flashes.  Allergies (verified): 1)  ! Pcn  Review of Systems       as per HPI  Physical Exam  General:  Well-developed,well-nourished,in no acute distress Head:  normocephalic and atraumatic.   No tenderness to palpation. Eyes:  vision grossly intact, pupils equal, pupils round, and pupils reactive to light.   Ears:  R ear normal and L ear normal.   Lungs:  normal respiratory effort and normal breath sounds.   Heart:  normal rate, regular rhythm, and no murmur.   Neurologic:  alert & oriented X3, cranial nerves II-XII intact, strength normal in all extremities, sensation intact to light touch, and sensation intact to pinprick.     Impression & Recommendations:  Problem # 1:  HYPERTENSION (ICD-401.9) Pt was on meds for her BP, but due to money issue, she was not able to take her meds for last 12 months. She did check her BP at her home with the electronic meter for about 1-2 times a week. She didn't had any problems with her BP until last 3 days, when she found one of her BP reading shooting upto 160/96. She used to eat low salt diet regularly, but in last month , they had many family gatherings due to which she was not able to control her salt intake.  changed her meds today, removed the meds below and started her on single pill Lisinopril-HCTZ as below. Will reassess the condition at next visit.  The following medications were removed from the medication list:    Hydrochlorothiazide 25 Mg Tabs (Hydrochlorothiazide) .Marland Kitchen... Take 1 tablet by mouth once a day    Atenolol 100 Mg Tabs (Atenolol) .Marland Kitchen... Take 1 tablet by mouth once a day    Lisinopril 10 Mg Tabs (Lisinopril) .Marland Kitchen... Take 1 tablet by  mouth once a day    Catapres 0.1 Mg Tabs (Clonidine hcl) .Marland Kitchen... Take one tablet daily at bedtime. Her updated medication list for this problem includes:    Lisinopril-hydrochlorothiazide 20-25 Mg Tabs (Lisinopril-hydrochlorothiazide) .Marland Kitchen... Take 1 tablet by mouth once a day  BP today: 158/94 Prior BP: 110/76 (08/14/2008)  Labs Reviewed: K+: 3.9 (05/22/2008) Creat: : 1.00 (05/22/2008)   Chol: 213 (05/22/2008)   HDL: 49 (05/22/2008)   LDL: 135 (05/22/2008)   TG: 143 (05/22/2008)  Problem # 2:  ASTHMA (ICD-493.90) She is improving . She gets SOB when she walks for long, but says its  better than before.  Refilled her meds, will reassess her in next visit. Her updated medication list for this problem includes:    Advair Diskus 100-50 Mcg/dose Misc (Fluticasone-salmeterol) ..... Use once daily to prevent asthma exacerbation.    Ventolin Hfa 108 (90 Base) Mcg/act Aers (Albuterol sulfate) ..... Use 2 puffs every 2-4 hours only when needed for shortness of breath.  Problem # 3:  MIGRAINES, HX OF (ICD-V13.8) She feels dull tightness in her frontal, temporal and occipital area, like a band.  It has been the same since the time it started. She hasn't taken anything to relieve it.  Advised her to take tylenol whenever she has pain.  Problem # 4:  HYPERLIPIDEMIA NEC/NOS (ICD-272.4) Pt was not taking any meds for about a year. So restarted the pt today with Pravastatin 40 mg/day.  Will check her lipid profile after 3 months, to see response to statin therapy. The following medications were removed from the medication list:    Pravachol 80 Mg Tabs (Pravastatin sodium) .Marland Kitchen... Take one tablet daily. Her updated medication list for this problem includes:    Pravastatin Sodium 40 Mg Tabs (Pravastatin sodium) .Marland Kitchen... Take 1 tablet by mouth once a day  Labs Reviewed: SGOT: 32 (05/22/2008)   SGPT: 37 (05/22/2008)   HDL:49 (05/22/2008), 49 (05/09/2008)  LDL:135 (05/22/2008), 135 (05/09/2008)  Chol:213  (05/22/2008), 213 (05/09/2008)  Trig:143 (05/22/2008), 143 (05/09/2008)  Complete Medication List: 1)  Advair Diskus 100-50 Mcg/dose Misc (Fluticasone-salmeterol) .... Use once daily to prevent asthma exacerbation. 2)  Ventolin Hfa 108 (90 Base) Mcg/act Aers (Albuterol sulfate) .... Use 2 puffs every 2-4 hours only when needed for shortness of breath. 3)  Fluoxetine Hcl 10 Mg Caps (Fluoxetine hcl) .... Take one tablet daily for hot flashes. 4)  Lisinopril-hydrochlorothiazide 20-25 Mg Tabs (Lisinopril-hydrochlorothiazide) .... Take 1 tablet by mouth once a day 5)  Pravastatin Sodium 40 Mg Tabs (Pravastatin sodium) .... Take 1 tablet by mouth once a day  Patient Instructions: 1)  Please schedule a follow-up appointment in 2 months. 2)   For BP- We are starting you back on meds. Also continue to take low salt diet. Also try walk as much as you could. 3)   If you develop chest pain, have severe difficulty breathing, or feel very tired , stop immediately and seek medical attention. 4)  For Headaches- you can take tylenol whenever you have your Migraine attacks. It will help you.  5)  If you seem to get more dizzy or your current condition worsens, contact us anytime.  6)  Also please let us know how you do with your new meds.  Prescriptions: VENTOLIN HFA 108 (90 BASE) MCG/ACT AERS (ALBUTEROL SULFATE) Use 2 puffs every 2-4 hours only when needed for shortness of breath.  #1 x 2   Entered and Authorized by:   Lyn Hollingshead   Signed by:   Lyn Hollingshead on 01/26/2010   Method used:   Electronically to        [facility] (retail)       [address]       [place], [place]  [address]       Ph: [phone]       Fax: [phone]   RxID:   [id number] ADVAIR DISKUS 100-50 MCG/DOSE MISC (FLUTICASONE-SALMETEROL) Use once daily to prevent asthma exacerbation.  #1 x 11   Entered and Authorized by:   Lyn Hollingshead   Signed by:  Lyn Hollingshead on 01/26/2010   Method used:    Electronically to        [facility] (retail)       [address]       [place], [place]  [address]       Ph: [phone]       Fax: [phone]   RxID:   [id number] PRAVASTATIN SODIUM 40 MG TABS (PRAVASTATIN SODIUM) Take 1 tablet by mouth once a day  #30 x 5   Entered and Authorized by:   Lyn Hollingshead   Signed by:   Lyn Hollingshead on 01/26/2010   Method used:   Electronically to        [facility] (retail)       [address]       [place], [place]  [address]       Ph: [phone]       Fax: [phone]   RxID:   [id number] LISINOPRIL-HYDROCHLOROTHIAZIDE 20-25 MG TABS (LISINOPRIL-HYDROCHLOROTHIAZIDE) Take 1 tablet by mouth once a day  #30 x 2   Entered and Authorized by:   Lyn Hollingshead   Signed by:   Lyn Hollingshead on 01/26/2010   Method used:   Electronically to        [facility] (retail)       [address]       [place], [place]  [address]       Ph: [phone]       Fax: [phone]   RxID:   [id number]   Prevention & Chronic Care Immunizations   Influenza vaccine: Fluvax Non-MCR  (06/23/2009)    Tetanus booster: Not documented    Pneumococcal vaccine: Not documented  Colorectal Screening   Hemoccult: Not documented    Colonoscopy: Not documented  Other Screening   Pap smear: Not documented    Mammogram: Not documented   Smoking status: quit  (02/25/2008)  Lipids   Total Cholesterol: 213  (05/22/2008)   LDL: 135  (05/22/2008)   LDL Direct: Not documented   HDL: 49  (05/22/2008)   Triglycerides: 143  (05/22/2008)    SGOT (AST): 32  (05/22/2008)   SGPT (ALT): 37  (05/22/2008)   Alkaline phosphatase: 84  (05/22/2008)   Total bilirubin: 0.5  (05/22/2008)    Lipid flowsheet reviewed?: Yes  Hypertension   Last Blood Pressure: 158 / 94  (01/26/2010)   Serum creatinine: 1.00  (05/22/2008)   Serum potassium 3.9  (05/22/2008)    Hypertension flowsheet  reviewed?: Yes   Progress toward BP goal: Deteriorated   Hypertension comments: Changed her meds to Lisinopril and HCTZ single pill  Self-Management Support :    Patient will work on the following items until the next clinic visit to reach self-care goals:     Medications and monitoring: take my medicines every day  (01/26/2010)     Eating: eat foods that are low in salt, eat baked foods instead of fried foods  (01/26/2010)     Activity: take a 30 minute walk every day, take the stairs instead of the elevator  (01/26/2010)    Hypertension self-management support: Education handout  (01/26/2010)   Hypertension education handout printed    Lipid self-management support: Education handout  (01/26/2010)     Lipid education handout printed

## 2010-08-19 NOTE — Consult Note (Signed)
Summary: [facility]   Imported By: Louretta Parma 02/23/2010 16:46:10  _____________________________________________________________________  External Attachment:    Type:   Image     Comment:   External Document

## 2010-08-19 NOTE — Assessment & Plan Note (Signed)
Summary: ACUTE/2 WEEK RECHECK FOR BP AND LABS/CH   Vital Signs:  Patient profile:   60 year old female Height:      66 inches (167.64 cm) Weight:      174.6 pounds (79.36 kg) BMI:     28.28 Temp:     97.3 degrees F (36.28 degrees C)     ORAL Pulse rate:   80 / minute BP sitting:   181 / 90  (right arm) Cuff size:   regular  Vitals Entered By: Theotis Barrio NT II (August 03, 2010 3:08 PM)  CC: PATIENT IS HERE FOR BP FOLLOW UP APPT / BLOOD WORK  / (BP UP FATHER IN HOSPITAL) Is Patient Diabetic? No Pain Assessment Patient in pain? yes     Location: HEADACHE Intensity:          7 Type: NAGGING Onset of pain     SINCE THIS MORNING Nutritional Status BMI of 25 - 29 = overweight  Have you ever been in a relationship where you felt threatened, hurt or afraid?No   Does patient need assistance? Functional Status Self care Ambulation Normal   Primary Care Provider:  Danelle Berry, MD  CC:  PATIENT IS HERE FOR BP FOLLOW UP APPT / BLOOD WORK  / (BP UP FATHER IN HOSPITAL).  History of Present Illness: Pt is a 60 y/o F with PMH outlined in the EMR who presents today for f/u of her BP.  She recently resumed tx with HCTZ 12.5mg  q.d after discontinuation of her lisinopril-hctz 2/2 symptomatic hypotension.  She is tolerating her hctz well without adverse effect.  Pt reports home BP reading approx 1.5 week ago of 157/87 with concominant improvement of her headaches.  Since then, her father was hospitalized and she has noticed increased frequency of her headaches are worsening control of her BP.  She denies any visual changes, c/p, sob, syncope, neurological changes, or other complaint.    Preventive Screening-Counseling & Management  Alcohol-Tobacco     Alcohol drinks/day: 0     Smoking Status: quit     Year Quit: > 20 years ago     Passive Smoke Exposure: no  Caffeine-Diet-Exercise     Does Patient Exercise: yes     Type of exercise: WALKING     Exercise (avg: min/session):  30-60     Times/week: 7  Current Medications (verified): 1)  Advair Diskus 100-50 Mcg/dose Misc (Fluticasone-Salmeterol) .... Use Once Daily To Prevent Asthma Exacerbation. 2)  Ventolin Hfa 108 (90 Base) Mcg/act Aers (Albuterol Sulfate) .... Use 2 Puffs Every 2-4 Hours Only When Needed For Shortness of Breath. 3)  Pravastatin Sodium 40 Mg Tabs (Pravastatin Sodium) .... Take 1 Tablet By Mouth Once A Day 4)  Aspirin 81 Mg Tbec (Aspirin) .... Take 1 Tablet By Mouth Once A Day 5)  Hydrochlorothiazide 12.5 Mg Tabs (Hydrochlorothiazide) .... Take 1 Tablet By Mouth Once A Day 6)  Tylenol Extra Strength 500 Mg Tabs (Acetaminophen) .... Take 2 Tablets By Mouth Every 6 Hours As Needed For Headaches. Do Not Take More Than 8 Tablets in 24 Hours. 7)  Amlodipine Besylate 2.5 Mg Tabs (Amlodipine Besylate) .... Take 1 Tablet By Mouth Once A Day  Allergies (verified): 1)  ! Pcn 2)  ! Sulfa  Past History:  Past medical, surgical, family and social histories (including risk factors) reviewed for relevance to current acute and chronic problems.  Past Medical History: Reviewed history from 02/19/2010 and no changes required. Hypertension - not on any medications  Menopause/post-menopausal symptoms Low back pain, history of Migraine headaches, history of Muscle rupture, partial right calf  Family History: Reviewed history from 02/15/2010 and no changes required. Grandmother had MI in 21's.   Grandmother: irregular heart requiring pacemaker.   Mother had enlarged heart.   Uncle had bypass surgery Several relatives with HTN  Social History: Reviewed history from 06/20/2008 and no changes required. Lives with husband.  Good relationship, feels safe at home. looking for work.  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  Physical Exam  General:  VSS reviewed. Pt hypertensive.  NAD Head:  atraumatic and no abnormalities observed.   Eyes:  pupils equal and pupils round.   Lungs:  normal  respiratory effort, normal breath sounds, no crackles, and no wheezes.   Heart:  normal rate, regular rhythm, and no murmur.   Extremities:  No edema  Neurologic:  alert & oriented X3 and gait normal.   CN grossly intact.  Sensation intact to light touch strength normal in all extremities.   Skin:  turgor normal and color normal.   Psych:  Oriented X3, normally interactive, and good eye contact.     Impression & Recommendations:  Problem # 1:  HYPERTENSION (ICD-401.9)  Repeat BP measurements obtained of  167/82 and 165/80.  This is an improvement from her intitial BP reading but still elevated above goal.  Will add a very low dose CCB to her regimen and start with amlodipine 2.5 mg q.d.  Will have pt rtc in 1-2 weeks to assess her tolerance to norvasc,  echeck BP, and assess need for dose titration.    Her updated medication list for this problem includes:    Hydrochlorothiazide 12.5 Mg Tabs (Hydrochlorothiazide) .Marland Kitchen... Take 1 tablet by mouth once a day    Amlodipine Besylate 2.5 Mg Tabs (Amlodipine besylate) .Marland Kitchen... Take 1 tablet by mouth once a day  BP today: 181/90 Prior BP: 165/93 (07/08/2010)  Labs Reviewed: K+: 4.0 (02/08/2010) Creat: : 1.05 (02/08/2010)   Chol: 213 (05/22/2008)   HDL: 49 (05/22/2008)   LDL: 135 (05/22/2008)   TG: 143 (05/22/2008)  Problem # 2:  HEADACHE (ICD-784.0) Pts headaches are best controlled when BP is at goal.  Will treat HTN as outlined above and advise pt to take tylenol as needed for pain relief.  Pt advised not to exceed >4000mg  tylenol in a 24hr period.  Her updated medication list for this problem includes:    Aspirin 81 Mg Tbec (Aspirin) .Marland Kitchen... Take 1 tablet by mouth once a day    Tylenol Extra Strength 500 Mg Tabs (Acetaminophen) .Marland Kitchen... Take 2 tablets by mouth every 6 hours as needed for headaches. do not take more than 8 tablets in 24 hours.  Complete Medication List: 1)  Advair Diskus 100-50 Mcg/dose Misc (Fluticasone-salmeterol) .... Use once  daily to prevent asthma exacerbation. 2)  Ventolin Hfa 108 (90 Base) Mcg/act Aers (Albuterol sulfate) .... Use 2 puffs every 2-4 hours only when needed for shortness of breath. 3)  Pravastatin Sodium 40 Mg Tabs (Pravastatin sodium) .... Take 1 tablet by mouth once a day 4)  Aspirin 81 Mg Tbec (Aspirin) .... Take 1 tablet by mouth once a day 5)  Hydrochlorothiazide 12.5 Mg Tabs (Hydrochlorothiazide) .... Take 1 tablet by mouth once a day 6)  Tylenol Extra Strength 500 Mg Tabs (Acetaminophen) .... Take 2 tablets by mouth every 6 hours as needed for headaches. do not take more than 8 tablets in 24 hours. 7)  Amlodipine Besylate 2.5 Mg Tabs (  Amlodipine besylate) .... Take 1 tablet by mouth once a day  Patient Instructions: 1)  Please schedule a follow-up appointment in 2 weeks to check your blood pressure. 2)  Amlodipine is a new medicine to help with your blood pressure.  If you notice any adverse effects,  or have any questions, call the clinic at [phone]. 3)  Good luck with your classes! Prescriptions: AMLODIPINE BESYLATE 2.5 MG TABS (AMLODIPINE BESYLATE) Take 1 tablet by mouth once a day  #30 x 0   Entered and Authorized by:   Nelda Bucks DO   Signed by:   Nelda Bucks DO on 08/03/2010   Method used:   Print then Give to Patient   RxID:   [id number]    Orders Added: 1)  Est. Patient Level III [65784]     Prevention & Chronic Care Immunizations   Influenza vaccine: Fluvax 3+  (06/23/2010)    Tetanus booster: Not documented    Pneumococcal vaccine: Not documented  Colorectal Screening   Hemoccult: Not documented    Colonoscopy: Not documented  Other Screening   Pap smear: Not documented    Mammogram: Not documented   Smoking status: quit  (08/03/2010)  Lipids   Total Cholesterol: 213  (05/22/2008)   LDL: 135  (05/22/2008)   LDL Direct: Not documented   HDL: 49  (05/22/2008)   Triglycerides: 143  (05/22/2008)    SGOT (AST): 25  (02/08/2010)   SGPT (ALT):  20  (02/08/2010)   Alkaline phosphatase: 84  (02/08/2010)   Total bilirubin: 0.5  (02/08/2010)  Hypertension   Last Blood Pressure: 181 / 90  (08/03/2010)   Serum creatinine: 1.05  (02/08/2010)   Serum potassium 4.0  (02/08/2010)  Self-Management Support :   Personal Goals (by the next clinic visit) :      Personal blood pressure goal: 140/90  (02/08/2010)     Personal LDL goal: 100  (02/08/2010)    Patient will work on the following items until the next clinic visit to reach self-care goals:     Medications and monitoring: take my medicines every day, check my blood pressure, bring all of my medications to every visit, weigh myself weekly  (08/03/2010)     Eating: drink diet soda or water instead of juice or soda, eat more vegetables, use fresh or frozen vegetables, eat foods that are low in salt, eat baked foods instead of fried foods, eat fruit for snacks and desserts, limit or avoid alcohol  (08/03/2010)     Activity: take a 30 minute walk every day  (08/03/2010)    Hypertension self-management support: Resources for patients handout  (08/03/2010)    Lipid self-management support: Resources for patients handout  (08/03/2010)        Resource handout printed.

## 2010-08-19 NOTE — Assessment & Plan Note (Signed)
Summary: EST-1 WEEK RECHECK/CH   Vital Signs:  Patient profile:   60 year old female Height:      66 inches (167.64 cm) Weight:      174.1 pounds (79.14 kg) BMI:     28.20 Temp:     97.3 degrees F (36.28 degrees C) oral Pulse rate:   81 / minute BP sitting:   119 / 79  (right arm)  Vitals Entered By: Stanton Kidney Ditzler RN (February 19, 2010 10:35 AM)  Is Patient Diabetic? No Pain Assessment Patient in pain? yes     Location: h/a Intensity: 4 Type: dull Onset of pain  today Nutritional Status BMI of 25 - 29 = overweight Nutritional Status Detail appetite fair  Have you ever been in a relationship where you felt threatened, hurt or afraid?denies   Does patient need assistance? Functional Status Self care Ambulation Normal Comments FU - feeling better. Discuss meds.   Primary Care Provider:  Elby Showers MD   History of Present Illness: August 1 - saw [place] in clinic and we referred her to cardiology for possible stress test August 2 - saw [facility] cardiologist and stopped HCTZ/lisinopril becuase the patient was orthostatic in clinic August 5 - follow-up here today August 10 will see the cardiologist again to follow-up  Since stopping the bp medication per cardiology's recommendations, the patient feels much better and has had increasingly infrequent episodes of dizziness and palpitations.  At those times she has taken her bp and it has been low (to 89/59).  She has been recording her BPs at home and her highest reading has been 112/75 (this morning in the AM).    This morning woke up with a dull headache which she attributes to allergies.  She has had headaches in the past when her blood pressure has been high, and she said that this morning's headache was not like those headaches.  She has had increased swelling of her feet since dc'ing the meds.  She is concerned about this.  No SOB.  She says that she has gotten better control of her sodium intake recently.  At her visit in July  she came to the outpatient clinic with high blood pressure and headaches after being off of her bp meds for over a year.  At that visit she was started on  HCTZ/lisinopril, the patient states she had been eating an unusually high salt diet for the previous month.    One episode of sharp chest pain that lasted a few seconds while at rest in the evening.  No nausea, vomiting, abdominal pain.  Patient has h/o asthma; has taken her advair and now has orange card so she can continue to take this medication.  Depression History:      The patient denies a depressed mood most of the day and a diminished interest in her usual daily activities.         Preventive Screening-Counseling & Management  Alcohol-Tobacco     Alcohol drinks/day: 0     Smoking Status: quit     Year Quit: > 20 years ago     Passive Smoke Exposure: no  Caffeine-Diet-Exercise     Does Patient Exercise: yes     Type of exercise: WALKING     Exercise (avg: min/session): 30-60     Times/week: 7  Current Medications (verified): 1)  Advair Diskus 100-50 Mcg/dose Misc (Fluticasone-Salmeterol) .... Use Once Daily To Prevent Asthma Exacerbation. 2)  Ventolin Hfa 108 (90 Base) Mcg/act Aers (  Albuterol Sulfate) .... Use 2 Puffs Every 2-4 Hours Only When Needed For Shortness of Breath. 3)  Pravastatin Sodium 40 Mg Tabs (Pravastatin Sodium) .... Take 1 Tablet By Mouth Once A Day 4)  Aspirin 81 Mg Tbec (Aspirin) .... Take 1 Tablet By Mouth Once A Day  Allergies: 1)  ! Pcn 2)  ! Sulfa  Past History:  Past Medical History: Hypertension - not on any medications Menopause/post-menopausal symptoms Low back pain, history of Migraine headaches, history of Muscle rupture, partial right calf  Family History: Reviewed history from 02/15/2010 and no changes required. Grandmother had MI in 58's.   Grandmother: irregular heart requiring pacemaker.   Mother had enlarged heart.   Uncle had bypass surgery Several relatives with  HTN  Social History: Reviewed history from 06/20/2008 and no changes required. Lives with husband.  Good relationship, feels safe at home. looking for work.  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  Physical Exam  General:  NAD Eyes:  pupils equal and pupils round.   Neck:  supple and full ROM.   Lungs:  normal respiratory effort, normal breath sounds, no crackles, and no wheezes.   Heart:  normal rate, regular rhythm, and no murmur.   Abdomen:  soft, non-tender, and normal bowel sounds.   Pulses:  2+ bilateral dorsalis pedis pulses Extremities:  trace left pedal edema and trace right pedal edema.   Neurologic:  alert & oriented X3 and gait normal.   Skin:  turgor normal.   Psych:  Oriented X3, normally interactive, good eye contact, not anxious appearing, and not depressed appearing.     Impression & Recommendations:  Problem # 1:  ORTHOSTATIC HYPOTENSION (ICD-458.0) Patient was seen by cardiology on 02/16/10 and they d/c'd her HCTZ/lisinopril after finding her to be orthostatic in their clinic (we unfortunately do not have the records from [place] yet), and patient is feeling much better with increasingly fewer episodes of dizziness, palpitations and shortness of breath.  She has checked her BPs several times a day and she has been consistently low to low-normal with her bps during her "episodes" being the lowest measurements in the 80s systolic.    She should continue to not take her bp medication and follow-up at her scheduled appointment at [facility] on 02/24/10.  We will see her back in 2 months to reevalute her need for bp medication.    Problem # 2:  LEG EDEMA, BILATERAL (ICD-782.3) Patient complains of increasing leg swelling since stopping her bp mediation.  We counseled the patient on maintaining a low-salt diet, using compression hose, and trying to elevate her feet to the level of her heart to reduce this.  We will reevaluate this problem when we see her again in 2 months.   If her bp improves and can handle a low-dose diuretic, this may be appropriate to try at her next visit.  The following medications were removed from the medication list:    Lisinopril-hydrochlorothiazide 20-25 Mg Tabs (Lisinopril-hydrochlorothiazide) .Marland Kitchen... Take 1 tablet by mouth once a day  Problem # 3:  HYPERTENSION (ICD-401.9) The patient is normotensive and off her bp medications.  She was counseled to try to maintain a low-salt diet and to continue to monitor her BPs daily until her next visit.    The following medications were removed from the medication list:    Lisinopril-hydrochlorothiazide 20-25 Mg Tabs (Lisinopril-hydrochlorothiazide) .Marland Kitchen... Take 1 tablet by mouth once a day  Problem # 4:  ASTHMA (ICD-493.90) The patient restarted Advair at her  last visit (which was only 4 days ago) because she was having increased need of her rescue inhaler; she now has the orange card and should be able to now afford Advair and she was told of the importance of using this medication every day.  Her updated medication list for this problem includes:    Advair Diskus 100-50 Mcg/dose Misc (Fluticasone-salmeterol) ..... Use once daily to prevent asthma exacerbation.    Ventolin Hfa 108 (90 Base) Mcg/act Aers (Albuterol sulfate) ..... Use 2 puffs every 2-4 hours only when needed for shortness of breath.  Complete Medication List: 1)  Advair Diskus 100-50 Mcg/dose Misc (Fluticasone-salmeterol) .... Use once daily to prevent asthma exacerbation. 2)  Ventolin Hfa 108 (90 Base) Mcg/act Aers (Albuterol sulfate) .... Use 2 puffs every 2-4 hours only when needed for shortness of breath. 3)  Pravastatin Sodium 40 Mg Tabs (Pravastatin sodium) .... Take 1 tablet by mouth once a day 4)  Aspirin 81 Mg Tbec (Aspirin) .... Take 1 tablet by mouth once a day  Patient Instructions: 1)  Please follow-up with me in 2 months.   2)  Please do NOT take your blood pressure medication.   3)  Please continue to take your  other medications as listed on this sheet.   Prevention & Chronic Care Immunizations   Influenza vaccine: Fluvax Non-MCR  (06/23/2009)    Tetanus booster: Not documented    Pneumococcal vaccine: Not documented  Colorectal Screening   Hemoccult: Not documented    Colonoscopy: Not documented  Other Screening   Pap smear: Not documented    Mammogram: Not documented   Smoking status: quit  (02/19/2010)  Lipids   Total Cholesterol: 213  (05/22/2008)   LDL: 135  (05/22/2008)   LDL Direct: Not documented   HDL: 49  (05/22/2008)   Triglycerides: 143  (05/22/2008)    SGOT (AST): 25  (02/08/2010)   SGPT (ALT): 20  (02/08/2010)   Alkaline phosphatase: 84  (02/08/2010)   Total bilirubin: 0.5  (02/08/2010)  Hypertension   Last Blood Pressure: 119 / 79  (02/19/2010)   Serum creatinine: 1.05  (02/08/2010)   Serum potassium 4.0  (02/08/2010)  Self-Management Support :   Personal Goals (by the next clinic visit) :      Personal blood pressure goal: 140/90  (02/08/2010)     Personal LDL goal: 100  (02/08/2010)    Patient will work on the following items until the next clinic visit to reach self-care goals:     Medications and monitoring: take my medicines every day, check my blood pressure, bring all of my medications to every visit, weigh myself weekly  (02/19/2010)     Eating: drink diet soda or water instead of juice or soda, eat more vegetables, use fresh or frozen vegetables, eat baked foods instead of fried foods, eat fruit for snacks and desserts, limit or avoid alcohol  (02/19/2010)     Activity: take a 30 minute walk every day  (02/19/2010)    Hypertension self-management support: Written self-care plan, Education handout, Resources for patients handout  (02/19/2010)   Hypertension self-care plan printed.   Hypertension education handout printed    Lipid self-management support: Written self-care plan, Education handout, Resources for patients handout  (02/19/2010)    Lipid self-care plan printed.   Lipid education handout printed      Resource handout printed.

## 2010-08-19 NOTE — Consult Note (Signed)
[facility]   Imported By: Louretta Parma 03/02/2010 16:32:10  _____________________________________________________________________  External Attachment:    Type:   Image     Comment:   External Document

## 2010-08-19 NOTE — Assessment & Plan Note (Signed)
Summary: FLU/ SB.  Nurse Visit   Allergies: 1)  ! Pcn  Immunizations Administered:  Influenza Vaccine # 1:    Vaccine Type: Fluvax Non-MCR    Site: left deltoid    Mfr: [employer]    Dose: 0.5 ml    Route: IM    Given by: Angelina Ok RN    Exp. Date: 09/15/2009    Lot #: [id number]    VIS given: 02/08/07 version given June 23, 2009.  Flu Vaccine Consent Questions:    Do you have a history of severe allergic reactions to this vaccine? no    Any prior history of allergic reactions to egg and/or gelatin? no    Do you have a sensitivity to the preservative Thimersol? no    Do you have a past history of Guillan-Barre Syndrome? no    Do you currently have an acute febrile illness? no    Have you ever had a severe reaction to latex? no    Vaccine information given and explained to patient? yes    Are you currently pregnant? no  Orders Added: 1)  Influenza Vaccine NON MCR [00028]

## 2010-08-19 NOTE — Assessment & Plan Note (Signed)
Summary: EST-2 MONTH RECHECK/CH   Vital Signs:  Patient profile:   60 year old female Height:      66 inches (167.64 cm) Weight:      175.4 pounds (79.73 kg) BMI:     28.41 Temp:     98.0 degrees F (36.67 degrees C) oral Pulse rate:   80 / minute BP sitting:   165 / 93  (left arm)  Vitals Entered By: Stanton Kidney Ditzler RN (July 08, 2010 3:15 PM) Is Patient Diabetic? No Pain Assessment Patient in pain? yes     Location: head Intensity: 7 Type: dull Onset of pain  past 2 weeks Nutritional Status BMI of 25 - 29 = overweight Nutritional Status Detail appetite fair  Have you ever been in a relationship where you felt threatened, hurt or afraid?denies   Does patient need assistance? 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 Discomfort in neck. Needs mammogrm. Refills on meds.   Primary Care Provider:  Elby Showers MD   History of Present Illness: Patient has chief complaint of headaches, start out dull and end up sharp.  Pt has history of migraines.  They wake her up at night.  For last 2 weeks, 3 days of the week.  Have caused nausea, vomiting, light sensitivity.  It will last for a few hours to overnight.  Bitemporal and occipital, radiating towards the front.  She has tried aspirin, aleve with some relief. Sometimes she will notice flashing small lights in her vision during the headache.  No vision changes otherwise. No weakness in her body, numbness, tingling.  In the past she has gotten a shot in the ED with her migraine.  Does not recall ever being prescribed a triptan in the past.  Sometimes an ache in the back of her neck, in the center.  Sometimes with swelling.  Patient has had recent post-nasal drip with face tenderness.  Nasal mucous is white - non-bloody.  No teeth pain.  No fevers.  No CP, SOB, abdominal pain, bowel or bladder complaints.     Depression History:      The patient denies a depressed mood most of the day and a diminished interest in her usual daily activities.         Preventive Screening-Counseling & Management  Alcohol-Tobacco     Alcohol drinks/day: 0     Smoking Status: quit     Year Quit: > 20 years ago     Passive Smoke Exposure: no  Caffeine-Diet-Exercise     Does Patient Exercise: yes     Type of exercise: WALKING     Exercise (avg: min/session): 30-60     Times/week: 7  Current Medications (verified): 1)  Advair Diskus 100-50 Mcg/dose Misc (Fluticasone-Salmeterol) .... Use Once Daily To Prevent Asthma Exacerbation. 2)  Ventolin Hfa 108 (90 Base) Mcg/act Aers (Albuterol Sulfate) .... Use 2 Puffs Every 2-4 Hours Only When Needed For Shortness of Breath. 3)  Pravastatin Sodium 40 Mg Tabs (Pravastatin Sodium) .... Take 1 Tablet By Mouth Once A Day 4)  Aspirin 81 Mg Tbec (Aspirin) .... Take 1 Tablet By Mouth Once A Day 5)  Hydrochlorothiazide 12.5 Mg Tabs (Hydrochlorothiazide) .... Take 1 Tablet By Mouth Once A Day 6)  Tylenol Extra Strength 500 Mg Tabs (Acetaminophen) .... Take 2 Tablets By Mouth Every 6 Hours As Needed For Headaches. Do Not Take More Than 8  Tablets in 24 Hours.  Allergies: 1)  ! Pcn 2)  ! Sulfa  Past History:  Past medical, surgical, family and social histories (including risk factors) reviewed for relevance to current acute and chronic problems.  Past Medical History: Reviewed history from 02/19/2010 and no changes required. Hypertension - not on any medications Menopause/post-menopausal symptoms Low back pain, history of Migraine headaches, history of Muscle rupture, partial right calf  Family History: Reviewed history from 02/15/2010 and no changes required. Grandmother had MI in 46's.   Grandmother: irregular heart requiring pacemaker.   Mother had enlarged heart.   Uncle had bypass surgery Several relatives with HTN  Social History: Reviewed history from  06/20/2008 and no changes required. Lives with husband.  Good relationship, feels safe at home. looking for work.  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  Review of Systems       See HPI  Physical Exam  General:  VSS reviewed. Pt hypertensive.  NAD Head:  atraumatic and no abnormalities observed.   Eyes:  pupils equal and pupils round.   Mouth:  pharynx pink and moist and fair dentition.   Neck:  supple and full ROM.   Lungs:  normal respiratory effort, normal breath sounds, no crackles, and no wheezes.   Heart:  normal rate, regular rhythm, and no murmur.   Abdomen:  soft, non-tender, and normal bowel sounds.   Pulses:  2+ bilateral dorsalis pedis pulses Extremities:  trace left pedal edema and trace right pedal edema.   Neurologic:  alert & oriented X3 and gait normal.   CN grossly intact.  Sensation intact to light touch Skin:  turgor normal.   Cervical Nodes:  no anterior cervical adenopathy.   Psych:  Oriented X3, normally interactive, good eye contact, not anxious appearing, and not depressed appearing.     Impression & Recommendations:  Problem # 1:  HEADACHE (ICD-784.0) After reviewing patient's chart, it appears that patient's headaches arebest controlled when her BP is in goal.  Will address BP and recommend tylenol for headaches and see if this improves patient's symptoms.  F/u in 2 weeks for check on symptoms.  Her updated medication list for this problem includes:    Aspirin 81 Mg Tbec (Aspirin) .Marland Kitchen... Take 1 tablet by mouth once a day    Tylenol Extra Strength 500 Mg Tabs (Acetaminophen) .Marland Kitchen... Take 2 tablets by mouth every 6 hours as needed for headaches. do not take more than 8 tablets in 24 hours.  Problem # 2:  HYPERTENSION (ICD-401.9) Patient with suboptimal control of HTN - recently stopped combo lisinopril-HCTZ pill 2/2 hypotension.  Will restart low dose HCTZ today with hopes of addressing #1.  Will have patient back in 2 weeks for BP check and  BMet.  Her updated medication list for this problem includes:    Hydrochlorothiazide 12.5 Mg Tabs (Hydrochlorothiazide) .Marland Kitchen... Take 1 tablet by mouth once a day  Future Orders: T-Basic Metabolic Panel [id number]) ... 07/22/2010  Complete Medication List: 1)  Advair Diskus 100-50 Mcg/dose Misc (Fluticasone-salmeterol) .... Use once daily to prevent asthma exacerbation. 2)  Ventolin Hfa 108 (90 Base) Mcg/act Aers (Albuterol sulfate) .... Use 2 puffs every 2-4 hours only when needed for shortness of breath. 3)  Pravastatin Sodium 40 Mg Tabs (Pravastatin sodium) .... Take 1 tablet by mouth once a day 4)  Aspirin 81 Mg Tbec (Aspirin) .... Take 1 tablet by mouth once a day 5)  Hydrochlorothiazide 12.5 Mg Tabs (Hydrochlorothiazide) .... Take 1 tablet by mouth once  a day 6)  Tylenol Extra Strength 500 Mg Tabs (Acetaminophen) .... Take 2 tablets by mouth every 6 hours as needed for headaches. do not take more than 8 tablets in 24 hours.  Patient Instructions: 1)  Please follow-up in the clinic in 2 weeks for blood work and BP check. 2)  Take your medicines above as directed. Prescriptions: HYDROCHLOROTHIAZIDE 12.5 MG TABS (HYDROCHLOROTHIAZIDE) Take 1 tablet by mouth once a day  #30 x 5   Entered and Authorized by:   Danelle Berry, MD   Signed by:   Danelle Berry, MD on 07/08/2010   Method used:   Print then Give to Patient   RxID:   [id number] VENTOLIN HFA 108 (90 BASE) MCG/ACT AERS (ALBUTEROL SULFATE) Use 2 puffs every 2-4 hours only when needed for shortness of breath.  #1 x 5   Entered and Authorized by:   Danelle Berry, MD   Signed by:   Danelle Berry, MD on 07/08/2010   Method used:   Print then Give to Patient   RxID:   [id number] ADVAIR DISKUS 100-50 MCG/DOSE MISC (FLUTICASONE-SALMETEROL) Use once daily to prevent asthma exacerbation.  #1 x 11   Entered and Authorized by:   Danelle Berry, MD   Signed by:   Danelle Berry, MD on 07/08/2010   Method used:   Print then  Give to Patient   RxID:   [id number] TYLENOL EXTRA STRENGTH 500 MG TABS (ACETAMINOPHEN) Take 2 tablets by mouth every 6 hours as needed for headaches. Do not take more than 8 tablets in 24 hours.  #120 x 5   Entered and Authorized by:   Danelle Berry, MD   Signed by:   Danelle Berry, MD on 07/08/2010   Method used:   Print then Give to Patient   RxID:   [id number] HYDROCHLOROTHIAZIDE 12.5 MG TABS (HYDROCHLOROTHIAZIDE) Take 1 tablet by mouth once a day  #30 x 5   Entered and Authorized by:   Danelle Berry, MD   Signed by:   Danelle Berry, MD on 07/08/2010   Method used:   Electronically to        [facility] (retail)       [address]       [place], [place]  [address]       Ph: [phone]       Fax: [phone]   RxID:   [id number]    Orders Added: 1)  T-Basic Metabolic Panel [id number] 2)  Est. Patient Level III [23762]   Process Orders Check Orders Results:     [facility]: ABN not required for this insurance Tests Sent for requisitioning (July 08, 2010 8:17 PM):     07/22/2010: [facility] -- T-Basic Metabolic Panel [id number] (signed)     Prevention & Chronic Care Immunizations   Influenza vaccine: Fluvax 3+  (06/23/2010)    Tetanus booster: Not documented    Pneumococcal vaccine: Not documented  Colorectal Screening   Hemoccult: Not documented    Colonoscopy: Not documented  Other Screening   Pap smear: Not documented    Mammogram: Not documented   Smoking status: quit  (07/08/2010)  Lipids   Total Cholesterol: 213  (05/22/2008)   LDL: 135  (05/22/2008)   LDL Direct: Not documented   HDL: 49  (05/22/2008)   Triglycerides: 143  (05/22/2008)    SGOT (AST): 25  (02/08/2010)   SGPT (ALT): 20  (02/08/2010)   Alkaline  phosphatase: 84  (02/08/2010)   Total bilirubin: 0.5  (02/08/2010)  Hypertension   Last Blood Pressure: 165 / 93  (07/08/2010)   Serum  creatinine: 1.05  (02/08/2010)   Serum potassium 4.0  (02/08/2010)  Self-Management Support :   Personal Goals (by the next clinic visit) :      Personal blood pressure goal: 140/90  (02/08/2010)     Personal LDL goal: 100  (02/08/2010)    Patient will work on the following items until the next clinic visit to reach self-care goals:     Medications and monitoring: take my medicines every day, check my blood pressure, bring all of my medications to every visit, weigh myself weekly  (07/08/2010)     Eating: drink diet soda or water instead of juice or soda, eat more vegetables, use fresh or frozen vegetables, eat baked foods instead of fried foods, eat fruit for snacks and desserts, limit or avoid alcohol  (07/08/2010)     Activity: take a 30 minute walk every day  (07/08/2010)    Hypertension self-management support: Written self-care plan, Education handout, Resources for patients handout  (07/08/2010)   Hypertension self-care plan printed.   Hypertension education handout printed    Lipid self-management support: Written self-care plan, Education handout, Resources for patients handout  (07/08/2010)   Lipid self-care plan printed.   Lipid education handout printed      Resource handout printed.

## 2010-08-19 NOTE — Assessment & Plan Note (Signed)
Summary: f/u, weak,dizzy, shortness of breath continuing from last vis...   Vital Signs:  Patient profile:   60 year old female Height:      66 inches (167.64 cm) Weight:      175.3 pounds (79.68 kg) BMI:     28.40 O2 Sat:      99 % Temp:     97.0 degrees F oral Pulse rate:   83 / minute BP sitting:   115 / 76  (right arm)  Vitals Entered By: Chinita Pester RN (February 08, 2010 1:37 PM) CC:  Continues  to c/o SOB/dizziness; no further h/a's.  Also feet cramps at night. Is Patient Diabetic? No Pain Assessment Patient in pain? no      Nutritional Status BMI of 25 - 29 = overweight  Have you ever been in a relationship where you felt threatened, hurt or afraid?No   Does patient need assistance? Functional Status Self care Ambulation Normal   Primary Care Provider:  Elby Showers MD  CC:   Continues  to c/o SOB/dizziness; no further h/a's.  Also feet cramps at night..  History of Present Illness: Pt is a 60 y/o woman with PMH/problems as outlined in EMR.  Pt comes to the clinic today with c/o   Muscle weakness and fatigue- x 1 month.        She gets tired and SOB easily even while walking about 100 yards, has weakness of her muscles, especially proximal, due to which she has dificulty doing overhead works e.g. combing her hair, difficulty standing for long in shower, doing vaccuum etc.  She has never has this kind of problem before. She denies any joint pain or stiffness or any muscle pain, tingling or numbness in the extremeties or elsewhere. Also she has decreased appetite and lost around 5 lbs of wt since her last visit on 01/26/10. She also c/o of cramps in her legs B/L. She had c/o headache at the last visit which is improved and has no more complaints.  Denies any Chest pain, vision changes, abd pain, diarrhea, urinary abn.  Depression History:      The patient denies a depressed mood most of the day and a diminished interest in her usual daily activities.          Preventive Screening-Counseling & Management  Alcohol-Tobacco     Alcohol drinks/day: 0     Smoking Status: quit     Year Quit: > 20 years ago     Passive Smoke Exposure: no  Caffeine-Diet-Exercise     Does Patient Exercise: no  Problems Prior to Update: 1)  Muscle Weakness (GENERALIZED)  (ICD-728.87) 2)  Low Back Pain, Chronic  (ICD-724.2) 3)  Health Screening  (ICD-V70.0) 4)  Asthma  (ICD-493.90) 5)  Uri  (ICD-465.9) 6)  Dental Pain  (ICD-525.9) 7)  Rotator Cuff Syndrome, Left  (ICD-726.10) 8)  Shoulder Pain, Left  (ICD-719.41) 9)  Hyperlipidemia Nec/nos  (ICD-272.4) 10)  Arm Pain  (ICD-729.5) 11)  Arm Numbness  (ICD-782.0) 12)  Muscle Rupture, Nontraumatic  (ICD-728.83) 13)  Menopausal Syndrome  (ICD-627.2) 14)  Migraines, Hx of  (ICD-V13.8) 15)  Hypertension  (ICD-401.9)  Medications Prior to Update: 1)  Advair Diskus 100-50 Mcg/dose Misc (Fluticasone-Salmeterol) .... Use Once Daily To Prevent Asthma Exacerbation. 2)  Ventolin Hfa 108 (90 Base) Mcg/act Aers (Albuterol Sulfate) .... Use 2 Puffs Every 2-4 Hours Only When Needed For Shortness of Breath. 3)  Fluoxetine Hcl 10 Mg Caps (Fluoxetine Hcl) .... Take One Tablet  Daily For Hot Flashes. 4)  Lisinopril-Hydrochlorothiazide 20-25 Mg Tabs (Lisinopril-Hydrochlorothiazide) .... Take 1 Tablet By Mouth Once A Day 5)  Pravastatin Sodium 40 Mg Tabs (Pravastatin Sodium) .... Take 1 Tablet By Mouth Once A Day  Current Medications (verified): 1)  Advair Diskus 100-50 Mcg/dose Misc (Fluticasone-Salmeterol) .... Use Once Daily To Prevent Asthma Exacerbation. 2)  Ventolin Hfa 108 (90 Base) Mcg/act Aers (Albuterol Sulfate) .... Use 2 Puffs Every 2-4 Hours Only When Needed For Shortness of Breath. 3)  Fluoxetine Hcl 10 Mg Caps (Fluoxetine Hcl) .... Take One Tablet Daily For Hot Flashes. 4)  Lisinopril-Hydrochlorothiazide 20-25 Mg Tabs (Lisinopril-Hydrochlorothiazide) .... Take 1 Tablet By Mouth Once A Day 5)  Pravastatin Sodium 40 Mg  Tabs (Pravastatin Sodium) .... Take 1 Tablet By Mouth Once A Day  Allergies (verified): 1)  ! Pcn 2)  ! Sulfa  Review of Systems       as per HPI.Marland Kitchen  Physical Exam  General:  Well-developed,well-nourished,in no acute distress Head:  normocephalic and atraumatic.   No tenderness to palpation. Ears:  R ear normal and L ear normal.   Neck:  supple, full ROM, and no masses.   Lungs:  normal respiratory effort and normal breath sounds.   Heart:  normal rate, regular rhythm, and no murmur.   Abdomen:  soft, non-tender, normal bowel sounds, and no distention.   Msk:  No tenderness to palpation to any joints or muscles. Power normal in both UE and LE even though pt complained of weakness.   Pulses:  R radial normal.   Neurologic:  alert & oriented X3 and strength normal in all extremities.     Impression & Recommendations:  Problem # 1:  MUSCLE WEAKNESS (GENERALIZED) (ICD-728.87) As per HPI, pt c/o muscle weakness. It has a broad DD. 1)Will check for Myasthenia Gravis as she has weakness with use of muscles and no pain. Ordered anti-AchE antibody.  2) will check for Polymyalgia Rheumatica or Temporal arteritis as she has a H/o headache also. ESR done. 3)Stopped Pravachol which was just restarted at last visit on 01/26/10, eventhough pt didn't complaint of any muscle pain, due to concern of any muscle problem due to it. CK done. 4) Check for any electrolyte abnormalities. Cmet, Mg. 5) Anemia and Thyroid abn- CBC, TSH  Orders: T-Comprehensive Metabolic Panel ([id number]) T-CBC No Diff ([id number]) T-Magnesium ([id number]) T-Sed Rate (Automated) ([id number]) T- * Misc. Laboratory test [id number]) T-CK Total [id number]) T-TSH [id number])  Problem # 2:  HYPERTENSION (ICD-401.9) Her BP is at goal and much improved than her last visit. Will continue the same Rx and reassess at next visit.  Her updated medication list for this problem includes:    Lisinopril-hydrochlorothiazide  20-25 Mg Tabs (Lisinopril-hydrochlorothiazide) .Marland Kitchen... Take 1 tablet by mouth once a day  Orders: T-Comprehensive Metabolic Panel ([id number])  BP today: 115/76 Prior BP: 158/94 (01/26/2010)  Labs Reviewed: K+: 3.9 (05/22/2008) Creat: : 1.00 (05/22/2008)   Chol: 213 (05/22/2008)   HDL: 49 (05/22/2008)   LDL: 135 (05/22/2008)   TG: 143 (05/22/2008)  Complete Medication List: 1)  Advair Diskus 100-50 Mcg/dose Misc (Fluticasone-salmeterol) .... Use once daily to prevent asthma exacerbation. 2)  Ventolin Hfa 108 (90 Base) Mcg/act Aers (Albuterol sulfate) .... Use 2 puffs every 2-4 hours only when needed for shortness of breath. 3)  Fluoxetine Hcl 10 Mg Caps (Fluoxetine hcl) .... Take one tablet daily for hot flashes. 4)  Lisinopril-hydrochlorothiazide 20-25 Mg Tabs (Lisinopril-hydrochlorothiazide) .... Take 1 tablet by mouth once a day  Patient Instructions:  1)  Please schedule a follow-up appointment in 1 week for f/u of lab tests. 2)  Stop taking PRAVACHOL( SIMVASTATIN)!!  Prevention & Chronic Care Immunizations   Influenza vaccine: Fluvax Non-MCR  (06/23/2009)    Tetanus booster: Not documented    Pneumococcal vaccine: Not documented  Colorectal Screening   Hemoccult: Not documented    Colonoscopy: Not documented  Other Screening   Pap smear: Not documented    Mammogram: Not documented   Smoking status: quit  (02/08/2010)  Lipids   Total Cholesterol: 213  (05/22/2008)   LDL: 135  (05/22/2008)   LDL Direct: Not documented   HDL: 49  (05/22/2008)   Triglycerides: 143  (05/22/2008)    SGOT (AST): 32  (05/22/2008)   SGPT (ALT): 37  (05/22/2008) CMP ordered    Alkaline phosphatase: 84  (05/22/2008)   Total bilirubin: 0.5  (05/22/2008)    Lipid flowsheet reviewed?: Yes   Progress toward LDL goal: Unchanged  Hypertension   Last Blood Pressure: 115 / 76  (02/08/2010)   Serum creatinine: 1.00  (05/22/2008)   Serum potassium 3.9  (05/22/2008) CMP ordered      Hypertension flowsheet reviewed?: Yes   Progress toward BP goal: At goal  Self-Management Support :   Personal Goals (by the next clinic visit) :      Personal blood pressure goal: 140/90  (02/08/2010)     Personal LDL goal: 100  (02/08/2010)    Patient will work on the following items until the next clinic visit to reach self-care goals:     Medications and monitoring: check my blood pressure, bring all of my medications to every visit  (02/08/2010)     Eating: eat more vegetables, use fresh or frozen vegetables, eat foods that are low in salt, eat baked foods instead of fried foods  (02/08/2010)     Activity: take a 30 minute walk every day  (02/08/2010)    Hypertension self-management support: Resources for patients handout, Written self-care plan  (02/08/2010)   Hypertension self-care plan printed.    Lipid self-management support: Resources for patients handout, Written self-care plan  (02/08/2010)   Lipid self-care plan printed.      Resource handout printed.  Process Orders Check Orders Results:     [facility]: ABN not required for this insurance Tests Sent for requisitioning (February 12, 2010 12:46 AM):     02/08/2010: [facility] -- T-Comprehensive Metabolic Panel [[id number]] (signed)     02/08/2010: [facility] -- T-CBC No Diff [[id number]] (signed)     02/08/2010: [facility] -- T-Magnesium [[id number]] (signed)     02/08/2010: [facility] -- T-Sed Rate (Automated) [[id number]] (signed)     02/08/2010: [facility] -- T- * Misc. Laboratory test [[id number]] (signed)     02/08/2010: [facility] -- T-CK Total [[id number]] (signed)     02/08/2010: [facility] -- T-TSH [id number] (signed)   Appended Document: f/u, weak,dizzy, shortness of breath continuing from last vis... I have discussed the care of this patient in detail with the resident and  agree fully with the documentation completed.

## 2010-08-19 NOTE — Letter (Signed)
Summary: Out of School  [facility]  [address]   [place], [place] [address]   Phone: [phone]  Fax: [phone]    August 03, 2010   Student:  Katherine Haynes    To Whom It May Concern:   For Family Medical reasons, please excuse the above named student from school for the following dates:  Start:   August 02, 2010  End:    August 13, 2010  If you need additional information, please feel free to contact our office.   Sincerely,    Nelda Bucks DO    ****This is a legal document and cannot be tampered with.  Schools are authorized to verify all information and to do so accordingly.

## 2010-08-19 NOTE — Progress Notes (Signed)
Summary: appt, ph call/ hla  Phone Note Call from Patient   Summary of Call: pt calls and states she feels no better since her last appt desires to be seen, appt set for this pm, instructed she may got to ED if becomes worse or feels she cannot wait, she verb understanding, thinks changing her meds did not help. encouraged to keep appt Initial call taken by: Marin Roberts RN,  February 08, 2010 12:43 PM  Follow-up for Phone Call        thanks  Follow-up by: Lyn Hollingshead,  February 10, 2010 10:19 AM

## 2010-08-19 NOTE — Progress Notes (Signed)
  Phone Note Outgoing Call Call back at [facility] Phone [phone]   Call placed by: Danelle Berry, MD Call placed to: Patient Action Taken: Appt scheduled, Information Sent Reason for Call: Confirm/change Appt Summary of Call: Patient was informed of the following: 1) take aspirin 81mg  by mouth daily 2) her follow-up appointment with me has been changed to this Friday, August 5, at 11am. 3) she has an appointment with [facility] today at 1:15pm with Dr. Ty Hilts to determine the need for a stress test. Initial call taken by: Patient     Appended Document:     Clinical Lists Changes  Medications: Added new medication of ASPIRIN 81 MG TBEC (ASPIRIN) Take 1 tablet by mouth once a day

## 2010-08-19 NOTE — Assessment & Plan Note (Signed)
Summary: RA/ONE WEEK RECHECK FOR LABS/CH   Vital Signs:  Patient profile:   60 year old female Height:      66 inches (167.64 cm) Weight:      172.0 pounds (79.68 kg) BMI:     28.40 Temp:     97.4 degrees F (36.33 degrees C) oral Pulse rate:   87 / minute BP sitting:   109 / 70  (left arm) Cuff size:   regular  Vitals Entered By: Theotis Barrio NT II (February 15, 2010 8:41 AM) CC: PATIENT IS HERE FOR  LAB RESULT RESULTS AND LAB TO BE DONE /  MEDICATION REFILL/ NO ENERGY Is Patient Diabetic? No Pain Assessment Patient in pain? no      Nutritional Status BMI of 25 - 29 = overweight  Have you ever been in a relationship where you felt threatened, hurt or afraid?No   Does patient need assistance? Functional Status Self care Ambulation Normal Comments PATIENT IS NEW TO DOCTOR   Primary Care Provider:  Elby Showers MD  CC:  PATIENT IS HERE FOR  LAB RESULT RESULTS AND LAB TO BE DONE /  MEDICATION REFILL/ NO ENERGY.  History of Present Illness: Still has fatigue, hard to complete daily tasks without taking breaks. With these episodes, gets lightheaded, feeling like going to pass out.  When active (taking a shower, for example), heart racing, gets tired, feels short of breath and then she needs to sit down.  This feeling will last up to 10 minutes.  This happens every time she exerts herself.  This has been going on for about a month, has gotten worse and worse.  Nothing else relieves these sensations.  Denies ever passing out, "losing time" or LOC with these episodes.  Can occur when she isat rest, including lying in bed - feels like the bed is spinning.  Denies CP/chest pressure. Sometimes sweating with these episodes.    Patient is concerned because of her Family history: grandmother: irregular heart requiring pacemaker.  Mother had enlarged heart.  Uncle had bypass surgery. FH of CAD (past age 51s).   Appetite has been poor, so has lost some weight ( ~8 pounds).  Feels some  fatigue in trapezius muscles. No other muscle aches or joint pains.   Patient has history of HTN, has taken med, but not measured her blood pressure.  No headaches since she has been on this new blood pressure medication.  Has noted some increased urination.  Swelling of feet has decreased.    Patient has h/o asthma, has used her inhaler 2-3x/day. waking up at night 1-2x/week with SOB. Has not been able to afford Advair. Her episodes seem unrelated to her breathing.    Labs performed at last visit (CBC, CMet, ESR, TSH etc) all reviewed and normal.    Preventive Screening-Counseling & Management  Alcohol-Tobacco     Alcohol drinks/day: 0     Smoking Status: quit     Year Quit: > 20 years ago     Passive Smoke Exposure: no  Caffeine-Diet-Exercise     Does Patient Exercise: yes     Type of exercise: WALKING     Exercise (avg: min/session): 30-60     Times/week: 7  Allergies: 1)  ! Pcn 2)  ! Sulfa  Past History:  Family History: Last updated: 02/15/2010 Grandmother had MI in 59's.   Grandmother: irregular heart requiring pacemaker.   Mother had enlarged heart.   Uncle had bypass surgery Several relatives with HTN  Family History: Grandmother had MI in 69's.   Grandmother: irregular heart requiring pacemaker.   Mother had enlarged heart.   Uncle had bypass surgery Several relatives with HTN  Social History: Does Patient Exercise:  yes  Review of Systems       See HPI  Physical Exam  General:  NAD Head:  atraumatic and no abnormalities observed.   Eyes:  pupils equal, pupils round, and pupils reactive to light.   Ears:  no external deformities.   Nose:  no external deformity, no external erythema, and no nasal discharge.   Mouth:  pharynx pink and moist and fair dentition.   Neck:  supple, full ROM, no masses, no thyromegaly, and no thyroid nodules or tenderness.   Lungs:  normal respiratory effort, normal breath sounds, no crackles, and no wheezes.   Heart:   normal rate, regular rhythm, and no murmur.   Abdomen:  soft, non-tender, and normal bowel sounds.   Msk:  normal ROM, no joint tenderness, and no joint swelling.   Pulses:  R dorsalis pedis normal and L dorsalis pedis normal.   Extremities:  trace left pedal edema and trace right pedal edema.   Neurologic:  alert & oriented X3, cranial nerves II-XII intact, strength normal in all extremities, and sensation intact to light touch.   Cervical Nodes:  no anterior cervical adenopathy.   Psych:  Oriented X3, normally interactive, good eye contact, and not anxious appearing.      Impression & Recommendations:  Problem # 1:  FATIGUE (ICD-780.79)  Patient presents with a month-long history of increased fatigue and episodes of shortness of breath and palpitations with exertion and at times even at rest.  She is concerned for her heart and has a family history of CAD in several family members.  She is post-menopausal, has hypertension and hyperlipidemia, all coronary risk factors.  Her symptoms are concerning for cardiac ischemia, also possibly arrhythmia.  Labs, including TSH, CBC, CMET, ESR, and w/u for myasthenia gravis were all normal.  EKG today - normal sinus without ischemia, rate 86.  Patient was able to renew her orange card today. - referral to [facility] cardiology for stress test - follow-up with me in one week.    Orders: Cardiology Referral (Cardiology)  Problem # 2:  ASTHMA (ICD-493.90) Patient has not been able to take her medicines 2/2 financial difficulties and has increased use in her rescue albuterol inhaler and nighttime SOB.  Today her peak flow was : #1 250; #2 280; #3 300 .  She was instructed how to monitor her peak flow at home.   -given peak flow meter and instructions -given sample of Advair -given refills of Advair and ventolin Rx to take to health dept pharmacy   Her updated medication list for this problem includes:    Advair Diskus 100-50 Mcg/dose Misc  (Fluticasone-salmeterol) ..... Use once daily to prevent asthma exacerbation.    Ventolin Hfa 108 (90 Base) Mcg/act Aers (Albuterol sulfate) ..... Use 2 puffs every 2-4 hours only when needed for shortness of breath.  Problem # 3:  HYPERTENSION (ICD-401.9) BP excellent today, and patient without headaches and leg swelling as before. - continue current lisinopril/HCTZ at current dose  Her updated medication list for this problem includes:    Lisinopril-hydrochlorothiazide 20-25 Mg Tabs (Lisinopril-hydrochlorothiazide) .Marland Kitchen... Take 1 tablet by mouth once a day  Problem # 4:  HYPERLIPIDEMIA NEC/NOS (ICD-272.4) We will restart the patient's statin today. Her symptoms not likely related to her statin and  given her risk factors, it is important that the patient achieve improved cholesterol control.   - given Rx for pravastatin  Her updated medication list for this problem includes:    Pravastatin Sodium 40 Mg Tabs (Pravastatin sodium) .Marland Kitchen... Take 1 tablet by mouth once a day  Complete Medication List: 1)  Advair Diskus 100-50 Mcg/dose Misc (Fluticasone-salmeterol) .... Use once daily to prevent asthma exacerbation. 2)  Ventolin Hfa 108 (90 Base) Mcg/act Aers (Albuterol sulfate) .... Use 2 puffs every 2-4 hours only when needed for shortness of breath. 3)  Lisinopril-hydrochlorothiazide 20-25 Mg Tabs (Lisinopril-hydrochlorothiazide) .... Take 1 tablet by mouth once a day 4)  Pravastatin Sodium 40 Mg Tabs (Pravastatin sodium) .... Take 1 tablet by mouth once a day  Other Orders: 12 Lead EKG (12 Lead EKG)  Patient Instructions: 1)  Please go to your scheduled appointment at [facility]. 2)  Please follow-up with me in clinic in one week. 3)  Please try using your peak flow meter during this next week to monitor your breathing, and if you become short of breath, try using your peak flow meter and note the numbers for three trials.  4)  Please restart your statin.  Prescriptions: PRAVASTATIN  SODIUM 40 MG TABS (PRAVASTATIN SODIUM) Take 1 tablet by mouth once a day  #30 x 5   Entered and Authorized by:   Danelle Berry, MD   Signed by:   Danelle Berry, MD on 02/15/2010   Method used:   Reprint   RxID:   [id number] VENTOLIN HFA 108 (90 BASE) MCG/ACT AERS (ALBUTEROL SULFATE) Use 2 puffs every 2-4 hours only when needed for shortness of breath.  #1 x 2   Entered and Authorized by:   Danelle Berry, MD   Signed by:   Danelle Berry, MD on 02/15/2010   Method used:   Reprint   RxID:   [id number] ADVAIR DISKUS 100-50 MCG/DOSE MISC (FLUTICASONE-SALMETEROL) Use once daily to prevent asthma exacerbation.  #1 x 11   Entered and Authorized by:   Danelle Berry, MD   Signed by:   Danelle Berry, MD on 02/15/2010   Method used:   Reprint   RxID:   [id number] VENTOLIN HFA 108 (90 BASE) MCG/ACT AERS (ALBUTEROL SULFATE) Use 2 puffs every 2-4 hours only when needed for shortness of breath.  #1 x 2   Entered and Authorized by:   Danelle Berry, MD   Signed by:   Danelle Berry, MD on 02/15/2010   Method used:   Print then Give to Patient   RxID:   [id number] ADVAIR DISKUS 100-50 MCG/DOSE MISC (FLUTICASONE-SALMETEROL) Use once daily to prevent asthma exacerbation.  #1 x 11   Entered and Authorized by:   Danelle Berry, MD   Signed by:   Danelle Berry, MD on 02/15/2010   Method used:   Print then Give to Patient   RxID:   [id number] PRAVASTATIN SODIUM 40 MG TABS (PRAVASTATIN SODIUM) Take 1 tablet by mouth once a day  #30 x 5   Entered and Authorized by:   Danelle Berry, MD   Signed by:   Danelle Berry, MD on 02/15/2010   Method used:   Print then Give to Patient   RxID:   [id number]    Prevention & Chronic Care Immunizations   Influenza vaccine: Fluvax Non-MCR  (06/23/2009)    Tetanus booster: Not documented    Pneumococcal vaccine: Not documented  Colorectal Screening   Hemoccult: Not documented    Colonoscopy: Not documented  Other  Screening   Pap smear: Not documented    Mammogram: Not documented   Smoking status: quit  (02/15/2010)  Lipids   Total Cholesterol: 213  (05/22/2008)   LDL: 135  (05/22/2008)   LDL Direct: Not documented   HDL: 49  (05/22/2008)   Triglycerides: 143  (05/22/2008)    SGOT (AST): 25  (02/08/2010)   SGPT (ALT): 20  (02/08/2010)   Alkaline phosphatase: 84  (02/08/2010)   Total bilirubin: 0.5  (02/08/2010)  Hypertension   Last Blood Pressure: 109 / 70  (02/15/2010)   Serum creatinine: 1.05  (02/08/2010)   Serum potassium 4.0  (02/08/2010)  Self-Management Support :   Personal Goals (by the next clinic visit) :      Personal blood pressure goal: 140/90  (02/08/2010)     Personal LDL goal: 100  (02/08/2010)    Patient will work on the following items until the next clinic visit to reach self-care goals:     Medications and monitoring: take my medicines every day, bring all of my medications to every visit  (02/15/2010)     Eating: drink diet soda or water instead of juice or soda, eat more vegetables, use fresh or frozen vegetables, eat foods that are low in salt, eat baked foods instead of fried foods, eat fruit for snacks and desserts, limit or avoid alcohol  (02/15/2010)     Activity: take a 30 minute walk every day  (02/15/2010)    Hypertension self-management support: Resources for patients handout  (02/15/2010)    Lipid self-management support: Resources for patients handout  (02/15/2010)     Self-management comments: PATIET STATES SHE WALKS THE DOG      Resource handout printed.   Appended Document: RA/ONE WEEK RECHECK FOR LABS/CH     Allergies: 1)  ! Pcn 2)  ! Sulfa   Complete Medication List: 1)  Advair Diskus 100-50 Mcg/dose Misc (Fluticasone-salmeterol) .... Use once daily to prevent asthma exacerbation. 2)  Ventolin Hfa 108 (90 Base) Mcg/act Aers (Albuterol sulfate) .... Use 2 puffs every 2-4 hours only when needed for shortness of breath. 3)   Lisinopril-hydrochlorothiazide 20-25 Mg Tabs (Lisinopril-hydrochlorothiazide) .... Take 1 tablet by mouth once a day 4)  Pravastatin Sodium 40 Mg Tabs (Pravastatin sodium) .... Take 1 tablet by mouth once a day

## 2010-08-19 NOTE — Miscellaneous (Signed)
Summary: Appointment Canceled  Appointment status changed to canceled by [employer] on 02/16/2010 4:20 PM.  Cancellation Comments --------------------- crs/dx:sob/hyperlipdemia/htn/wt:172/ins:self/DR watson  Appointment Information ----------------------- Appt Type:  CARDIOLOGY NUCLEAR TESTING      Date:  Thursday, February 18, 2010      Time:  7:30 AM for 15 min   Urgency:  Routine   Made By:  Pearson Grippe  To Visit:  [id number]    Reason:  crs/dx:sob/hyperlipdemia/htn/wt:172/ins:self/DR watson  Appt Comments ------------- -- 02/16/10 16:20: ([facility]) CANCELED -- crs/dx:sob/hyperlipdemia/htn/wt:172/ins:self/DR watson -- 02/16/10 9:02: ([facility]) BOOKED -- Routine CARDIOLOGY NUCLEAR TESTING at 02/18/2010 7:30 AM for 15 min crs/dx:sob/hyperlipdemia/htn/wt:172/ins:self/DR watson

## 2010-10-07 ENCOUNTER — Ambulatory Visit (INDEPENDENT_AMBULATORY_CARE_PROVIDER_SITE_OTHER): Payer: Self-pay | Admitting: Internal Medicine

## 2010-10-07 ENCOUNTER — Encounter: Payer: Self-pay | Admitting: Internal Medicine

## 2010-10-07 VITALS — BP 170/97 | HR 77 | Temp 97.1°F | Ht 66.0 in | Wt 170.1 lb

## 2010-10-07 DIAGNOSIS — R51 Headache: Secondary | ICD-10-CM

## 2010-10-07 DIAGNOSIS — J309 Allergic rhinitis, unspecified: Secondary | ICD-10-CM

## 2010-10-07 DIAGNOSIS — I10 Essential (primary) hypertension: Secondary | ICD-10-CM

## 2010-10-07 DIAGNOSIS — J302 Other seasonal allergic rhinitis: Secondary | ICD-10-CM | POA: Insufficient documentation

## 2010-10-07 DIAGNOSIS — J45909 Unspecified asthma, uncomplicated: Secondary | ICD-10-CM

## 2010-10-07 DIAGNOSIS — E785 Hyperlipidemia, unspecified: Secondary | ICD-10-CM

## 2010-10-07 NOTE — Patient Instructions (Signed)
Take cetirizine once a day and flonase as directed when you are having allergies. If you start feeling worse, run a fever, or do not improve with these medicines, please call us.  I would like for you to follow-up with me in 2-3 weeks as you will need to have your blood pressure checked. Please continue to try to take your other medicines as directed.

## 2010-10-07 NOTE — Assessment & Plan Note (Signed)
Above goal.  I refilled the patient's antihypertensive. She should follow up in 2-3 weeks with [place] so that she can have a repeat blood pressure check. She did not  Have her medicines for approximately 1.5 months.  I cannot open up the meds and order section for some reason therefore gave her paper prescription for norvasc and HCTZ at current doses.Marland Kitchen

## 2010-10-07 NOTE — Assessment & Plan Note (Signed)
The patient has had much fewer of these even that she's been out of her blood pressure medicines. We'll continue to monitor. She will restart her blood pressure medicines hopefully today when she refills her prescriptions.

## 2010-10-07 NOTE — Assessment & Plan Note (Signed)
Patient appears to be suffering from seasonal allergies. She's had this in the past. Responded well to cetirizine. She tried Claritin but this did not work well for her so far.  Gave the patient paper prescriptions for cetirizine and  Flonase -  Hopefully she will be able to fill at least one of these. She recently got a new job and states that she was not able to afford her medicines and is a more stable living situation. Is instructed to return to clinic if she developed a fever shortness of breath or her symptoms do not resolve with appropriate treatment here

## 2010-10-07 NOTE — Progress Notes (Signed)
Subjective:    Patient ID: Katherine Haynes, female    DOB: 31-Jan-1951, 60 y.o.   MRN: [id number]  Cough  Patient presents for acute visit - has been congested, pressure around eyes, coughing productive of white mucus for the past week.  Last 2 days have been much worse - eyes matted up with white mucus upon waking.  Itching nose, burning eyes.  Thick white mucus coming from nose and draining down throat.  No blood in mucus.  Patient has tried OTC claritin and mucinex cold and sinus but these did not help her.  Patient has had allergies in the past and has responded well to cetirizine.  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  Few kids have had a stomach virus.   No frank headaches like as before when patient had elevated BP.  Only pressure-like pain since acute illness per above.  No fevers, chills.  No chest pain.  No nausea, vomiting, diarrhea, constipation.  Continued hot flashes.  Patient has been out of prescriptions for about 1.5 months.  Patient states she has recently gone through a move - separated from her fiancee.  Living with her sister and she says this is good for her.  Still going to school.    Patient was doing well without the advair - only using the albuterol maybe 1-2x/month.  Hasn't had it to use during this acute illness.  Some SOB during walking on Monday but none otherwise.   Review of Systems  Respiratory: Positive for cough.   As per HPI.     Objective:   Physical Exam  Constitutional: She is oriented to person, place, and time. She appears well-developed and well-nourished. No distress.  HENT:  Head: Normocephalic and atraumatic.  Nose: Nose normal.  Mouth/Throat: Oropharynx is clear and moist. No oropharyngeal exudate.  Eyes: Conjunctivae and EOM are normal. Pupils are equal, round, and reactive to light. Right eye exhibits no discharge. Left eye exhibits no discharge. No scleral icterus.  Neck: Normal range of  motion. Neck supple. No thyromegaly present.  Cardiovascular: Normal rate, regular rhythm and normal heart sounds.   No murmur heard. Pulmonary/Chest: Effort normal and breath sounds normal. No respiratory distress. She has no wheezes. She exhibits no tenderness.  Abdominal: Soft. Bowel sounds are normal. She exhibits no distension. There is no tenderness.  Musculoskeletal: She exhibits no edema and no tenderness.  Lymphadenopathy:    She has no cervical adenopathy.  Neurological: She is alert and oriented to person, place, and time. No cranial nerve deficit.  Skin: Skin is warm and dry. No rash noted. She is not diaphoretic. No erythema. No pallor.  Psychiatric: She has a normal mood and affect. Her behavior is normal. Judgment and thought content normal.          Assessment & Plan:

## 2010-10-07 NOTE — Progress Notes (Signed)
Subjective:    Patient ID: Katherine Haynes, female    DOB: 1951-06-26, 60 y.o.   MRN: [id number]  HPI  patient is a 60 year old female with a past medical history of hypertension, hyperlipidemia, asthma, chronic low back pain , who presents to the clinic today for the following:  1)    Review of Systems     Objective:   Physical Exam        Assessment & Plan:

## 2010-10-07 NOTE — Assessment & Plan Note (Signed)
Refill the patient's statin medication. She did not for approximately 1.5 months.  I cannot open up the meds and order section for some reason therefore gave her paper prescription.

## 2010-10-07 NOTE — Assessment & Plan Note (Signed)
Patient states she was not taking her Advair regularly and was doing well using her albuterol inhaler only one 2 times a month. Therefore I chose to only refill her albuterol inhaler.

## 2010-10-26 ENCOUNTER — Encounter: Payer: Self-pay | Admitting: Internal Medicine

## 2010-10-26 ENCOUNTER — Ambulatory Visit: Payer: Self-pay | Admitting: Internal Medicine

## 2010-10-26 ENCOUNTER — Other Ambulatory Visit: Payer: Self-pay | Admitting: Internal Medicine

## 2010-10-26 ENCOUNTER — Ambulatory Visit (INDEPENDENT_AMBULATORY_CARE_PROVIDER_SITE_OTHER): Payer: Self-pay | Admitting: Internal Medicine

## 2010-10-26 VITALS — BP 137/92 | HR 106 | Temp 98.4°F | Wt 170.9 lb

## 2010-10-26 DIAGNOSIS — Z23 Encounter for immunization: Secondary | ICD-10-CM

## 2010-10-26 DIAGNOSIS — I10 Essential (primary) hypertension: Secondary | ICD-10-CM

## 2010-10-26 MED ORDER — HYDROCHLOROTHIAZIDE 25 MG PO TABS: 25.0000 mg | ORAL_TABLET | Freq: Every day | ORAL | Status: DC

## 2010-10-26 NOTE — Progress Notes (Signed)
Subjective:    Patient ID: Katherine Haynes, female    DOB: [birth year], 60 y.o.   MRN: [id number]  HPI patient is a 60 year old female with past medical history as described in the chart. Patient was started on unusual allergy medication by Dr. Claudette Laws at her last office visit. Patient denies any new symptoms and says that the new medication did help a lot. Patient does not have any new complaints today.  Patient's blood pressure is 137/92 today which is much better than what it was last time. Patient's pulse is still a little bit high at 106 per minute. I would increase HCTZ to 25 mg a day as patient still has bilateral lower extremity edema.  Patient is due for a mammogram, colonoscopy him a Pap smear and tetanus shot. I would set the patient up for a mammogram and colonoscopy today. I would also give patient a tetanus shot today. Patient does not want to get a Pap smear today, I would refer the patient to come to her primary care physician to have this set up.  The patient does not have any other problems.    Review of Systems  Constitutional: Negative for fever, activity change and appetite change.  HENT: Negative for sore throat.   Respiratory: Negative for cough and shortness of breath.   Cardiovascular: Negative for chest pain and leg swelling.  Gastrointestinal: Negative for nausea, abdominal pain, diarrhea, constipation and abdominal distention.  Genitourinary: Negative for frequency, hematuria and difficulty urinating.  Neurological: Negative for dizziness and headaches.  Psychiatric/Behavioral: Negative for suicidal ideas and behavioral problems.       Objective:   Physical Exam  Constitutional: She is oriented to person, place, and time. She appears well-developed and well-nourished.  HENT:  Head: Normocephalic and atraumatic.  Eyes: Conjunctivae and EOM are normal. Pupils are equal, round, and reactive to light. No scleral icterus.  Neck: Normal range of motion. Neck supple.  No JVD present. No thyromegaly present.  Cardiovascular: Normal rate, regular rhythm, normal heart sounds and intact distal pulses.  Exam reveals no gallop and no friction rub.   No murmur heard. Pulmonary/Chest: Effort normal and breath sounds normal. No respiratory distress. She has no wheezes. She has no rales.  Abdominal: Soft. Bowel sounds are normal. She exhibits no distension and no mass. There is no tenderness. There is no rebound and no guarding.  Musculoskeletal: Normal range of motion. She exhibits no edema and no tenderness.  Lymphadenopathy:    She has no cervical adenopathy.  Neurological: She is alert and oriented to person, place, and time.  Psychiatric: She has a normal mood and affect. Her behavior is normal.          Assessment & Plan:

## 2010-10-26 NOTE — Assessment & Plan Note (Signed)
Patient's blood pressure is mildly elevated today at 137/92 I would increase patient's HCTZ 25 mg daily. I would have patient come back in about 3-6 month to have a followup.

## 2010-10-26 NOTE — Patient Instructions (Signed)
Edema Edema is an abnormal build-up of fluids in tissues. Because this is partly dependent on gravity (water flows to the lowest place), it is more common in the lower extremities (legs and thighs). It is also common in the looser tissues, like around the eyes. Painless swelling of the feet and ankles is common and increases as a person ages. It may affect both legs and may include the calves or even thighs. When squeezed, the fluid may move out of the affected area and may leave a dent for a few moments. CAUSES  Prolonged standing or sitting in one place for extended periods of time. Movement helps pump tissue fluid into the veins, and absence of movement prevents this, resulting in edema.   Varicose veins. The valves in the veins do not work as well as they should. This causes fluid to leak into the tissues.   Fluid and salt overload.   Injury, burn, or surgery to the leg, ankle, or foot, may damage veins and allow fluid to leak out.   Sunburn damages vessels. Leaky vessels allow fluid to go out into the sunburned tissues.   Allergies (from insect bites or stings, medications or chemicals) cause swelling by allowing vessels to become leaky.   Protein in the blood helps keep fluid in your vessels. Low protein, as in malnutrition, allows fluid to leak out.   Hormonal changes, including pregnancy and menstruation, cause fluid retention. This fluid may leak out of vessels and cause edema.   Medications that cause fluid retention. Examples are sex hormones, blood pressure medications, steroid treatment, or anti-depressants.   Some illnesses cause edema, especially heart failure, kidney disease, or liver disease.   Surgery that cuts veins or lymph nodes, such as surgery done for the heart or for breast cancer, may result in edema.  DIAGNOSIS Your caregiver is usually easily able to determine what is causing your swelling (edema) by simply asking what is wrong (getting a history) and examining  you (doing a physical). Sometimes x-rays, EKG (electrocardiogram or heart tracing), and blood work may be done to evaluate for underlying medical illness. TREATMENT General treatment includes:  Leg elevation (or elevation of the affected body part).   Restriction of fluid intake.   Prevention of fluid overload.   Compression of the affected body part. Compression with elastic bandages or support stockings squeezes the tissues, preventing fluid from entering and forcing it back into the blood vessels.   Diuretics (also called water pills or fluid pills) pull fluid out of your body in the form of increased urination. These are effective in reducing the swelling, but can have side effects and must be used only under your caregiver's supervision. Diuretics are appropriate only for some types of edema.  The specific treatment can be directed at any underlying causes discovered. Heart, liver, or kidney disease should be treated appropriately. HOME CARE INSTRUCTIONS  Elevate the legs (or affected body part) above the level of the heart, while lying down.   Avoid sitting or standing still for prolonged periods of time.   Avoid putting anything directly under the knees when lying down, and do not wear constricting clothing or garters on the upper legs.   Exercising the legs causes the fluid to work back into the veins and lymphatic channels. This may help the swelling go down.   The pressure applied by elastic bandages or support stockings can help reduce ankle swelling.   A low-salt diet may help reduce fluid retention and decrease the   ankle swelling.   Take any medications exactly as prescribed.  SEEK MEDICAL CARE IF:  Your edema is not responding to recommended treatments.  SEEK IMMEDIATE MEDICAL CARE IF:  You develop shortness of breath or chest pain.   You cannot breathe when you lay down; or if, while lying down, you have to get up and go to the window to get your breath.   You are  having increasing swelling without relief from treatment.   You develop a fever over 101F.   You develop pain or redness in the areas that are swollen.   Tell your caregiver right away if you have gained 3lbs in 1 day or 5lbs in a week.  MAKE SURE YOU:  Understand these instructions.   Will watch your condition.   Will get help right away if you are not doing well or get worse.  Document Released: 07/04/2005 Document Re-Released: 12/22/2009 [facility] Patient Information 2011 [place], [place].

## 2010-11-11 ENCOUNTER — Encounter: Payer: Self-pay | Admitting: Ophthalmology

## 2010-11-29 ENCOUNTER — Other Ambulatory Visit: Payer: Self-pay | Admitting: Internal Medicine

## 2010-11-29 DIAGNOSIS — Z1231 Encounter for screening mammogram for malignant neoplasm of breast: Secondary | ICD-10-CM

## 2010-12-02 ENCOUNTER — Emergency Department (HOSPITAL_COMMUNITY)
Admission: EM | Admit: 2010-12-02 | Discharge: 2010-12-02 | Disposition: A | Discharge status: Home / Self Care | Payer: Self-pay | Attending: Emergency Medicine | Admitting: Emergency Medicine

## 2010-12-02 DIAGNOSIS — R110 Nausea: Secondary | ICD-10-CM | POA: Insufficient documentation

## 2010-12-02 DIAGNOSIS — G43909 Migraine, unspecified, not intractable, without status migrainosus: Secondary | ICD-10-CM | POA: Insufficient documentation

## 2010-12-02 DIAGNOSIS — H53149 Visual discomfort, unspecified: Secondary | ICD-10-CM | POA: Insufficient documentation

## 2010-12-02 DIAGNOSIS — Z79899 Other long term (current) drug therapy: Secondary | ICD-10-CM | POA: Insufficient documentation

## 2010-12-02 DIAGNOSIS — I10 Essential (primary) hypertension: Secondary | ICD-10-CM | POA: Insufficient documentation

## 2010-12-02 DIAGNOSIS — J45909 Unspecified asthma, uncomplicated: Secondary | ICD-10-CM | POA: Insufficient documentation

## 2010-12-14 ENCOUNTER — Ambulatory Visit (HOSPITAL_COMMUNITY)
Admission: RE | Admit: 2010-12-14 | Discharge: 2010-12-14 | Disposition: A | Discharge status: Home / Self Care | Payer: Self-pay | Source: Ambulatory Visit | Attending: Internal Medicine | Admitting: Internal Medicine

## 2010-12-14 DIAGNOSIS — Z1231 Encounter for screening mammogram for malignant neoplasm of breast: Secondary | ICD-10-CM

## 2011-03-17 ENCOUNTER — Encounter: Payer: Self-pay | Admitting: Internal Medicine

## 2011-03-17 ENCOUNTER — Ambulatory Visit (INDEPENDENT_AMBULATORY_CARE_PROVIDER_SITE_OTHER): Payer: Self-pay | Admitting: Internal Medicine

## 2011-03-17 DIAGNOSIS — E785 Hyperlipidemia, unspecified: Secondary | ICD-10-CM

## 2011-03-17 DIAGNOSIS — Z299 Encounter for prophylactic measures, unspecified: Secondary | ICD-10-CM

## 2011-03-17 DIAGNOSIS — I10 Essential (primary) hypertension: Secondary | ICD-10-CM

## 2011-03-17 DIAGNOSIS — M545 Low back pain, unspecified: Secondary | ICD-10-CM

## 2011-03-17 DIAGNOSIS — Z23 Encounter for immunization: Secondary | ICD-10-CM

## 2011-03-17 NOTE — Assessment & Plan Note (Signed)
She complains of worsening of her chronic lower back pain for last 2 weeks . The pain has been radiating down to both her legs associated with some numbness.On my exam  SLRT was negative but the ROM with right hip was painful. Differentials include degenerative joint disease versus osteoarthritis versus radiculopathy versus musculoskeletal.  Patient states losing weight and exercising. Congratulated her and encouraged her to continue doing the same!. Patient was advised symptomatic management of pain with as needed pain medications.  She was advised to use warm or cool compresses when her pain is really bad.  We may consider getting an imaging of her right hip if pain gets worse at her followup visit.

## 2011-03-17 NOTE — Assessment & Plan Note (Signed)
Lab Results  Component Value Date   NA 140 08/03/2010   K 4.2 08/03/2010   CL 105 08/03/2010   CO2 29 08/03/2010   BUN 14 08/03/2010   CREATININE 0.82 08/03/2010    BP Readings from Last 3 Encounters:  03/17/11 139/86  10/26/10 137/92  10/07/10 170/97    Assessment: Hypertension control:  controlled  Progress toward goals:  at goal Barriers to meeting goals:  no barriers identified  Plan: Hypertension treatment:  continue current medications. She states that she is exercising and trying to reduce weight- encouraged her to continue doing the same.

## 2011-03-17 NOTE — Assessment & Plan Note (Signed)
Check lipid panel and CMP today. 

## 2011-03-17 NOTE — Progress Notes (Signed)
Subjective:    Patient ID: Katherine Haynes, female    DOB: [birth year], 60 y.o.   MRN: [id number]  HPI: 60 year old woman with past medical history significant for hypertension, chronic low back pain comes to the clinic for followup visit.  She states that for the last 2 weeks her chronic back pain has worsened to a point that sometimes she feels her pain is 12 out of 10. Her pain is located in her lower back, radiates to her bilateral legs associated with tingling and numbness.  Patient states that she has stopped working about the same time that is 2 weeks ago.  She states that she she is exercising regularly and has lost some weight too.    Review of Systems  Constitutional: Negative for fever, chills and fatigue.  HENT: Negative for nosebleeds, congestion, rhinorrhea, sneezing and postnasal drip.   Eyes: Negative for visual disturbance.  Respiratory: Negative for cough, choking, chest tightness, shortness of breath, wheezing and stridor.   Cardiovascular: Negative for chest pain, palpitations and leg swelling.  Musculoskeletal: Positive for back pain and arthralgias.  Neurological: Positive for numbness. Negative for light-headedness and headaches.  Hematological: Negative for adenopathy.       Objective:   Physical Exam  Constitutional: She is oriented to person, place, and time. She appears well-developed and well-nourished. No distress.  HENT:  Head: Normocephalic and atraumatic.  Mouth/Throat: No oropharyngeal exudate.  Eyes: Pupils are equal, round, and reactive to light.  Neck: Normal range of motion. Neck supple. No JVD present. No tracheal deviation present. No thyromegaly present.  Cardiovascular: Normal rate, regular rhythm and intact distal pulses.  Exam reveals no gallop and no friction rub.   No murmur heard. Pulmonary/Chest: Effort normal and breath sounds normal. No stridor. No respiratory distress. She has no wheezes. She has no rales. She exhibits no tenderness.    Abdominal: Soft. Bowel sounds are normal. She exhibits no distension and no mass. There is no tenderness. There is no rebound and no guarding.  Musculoskeletal:       SLRT negative, Painful ROM with right hip.  Lymphadenopathy:    She has no cervical adenopathy.  Neurological: She is alert and oriented to person, place, and time. She has normal reflexes. No cranial nerve deficit. Coordination normal.       Sensations intact to light touch bilaterally, reflexes 2+ b/l symmetrical  Skin: Skin is warm. She is not diaphoretic.          Assessment & Plan:

## 2011-03-17 NOTE — Patient Instructions (Addendum)
Please take your medicines as prescribed. Please schedule a follow up appointment with your PCP in 2-3 months. Please get your medication bottles with your next visit.  Back Pain (Lumbosacral Strain) Back pain is one of the most common causes of pain. There are many causes of back pain. Most are not serious conditions.  CAUSES Your backbone (spinal column) is made up of 24 main vertebral bodies, the sacrum, and the coccyx. These are held together by muscles and tough, fibrous tissue (ligaments). Nerve roots pass through the openings between the vertebrae. A sudden move or injury to the back may cause injury to, or pressure on, these nerves. This may result in localized back pain or pain movement (radiation) into the buttocks, down the leg, and into the foot. Sharp, shooting pain from the buttock down the back of the leg (sciatica) is frequently associated with a ruptured (herniated) disc. Pain may be caused by muscle spasm alone. Your caregiver can often find the cause of your pain by the details of your symptoms and an exam. In some cases, you may need tests (such as X-rays). Your caregiver will work with you to decide if any tests are needed based on your specific exam. HOME CARE INSTRUCTIONS  Avoid an underactive lifestyle. Active exercise, as directed by your caregiver, is your greatest weapon against back pain.   Avoid hard physical activities (tennis, racquetball, water-skiing) if you are not in proper physical condition for it. This may aggravate and/or create problems.   If you have a back problem, avoid sports requiring sudden body movements. Swimming and walking are generally safer activities.   Maintain good posture.   Avoid becoming overweight (obese).   Use bed rest for only the most extreme, sudden (acute) episode. Your caregiver will help you determine how much bed rest is necessary.   For acute conditions, you may put ice on the injured area.   Put ice in a plastic bag.    Place a towel between your skin and the bag.   Leave the ice on for 5 mins minutes at a time, every 2 hours, or as needed.   After you are improved and more active, it may help to apply heat for 30 minutes before activities.  See your caregiver if you are having pain that lasts longer than expected. Your caregiver can advise appropriate exercises and/or therapy if needed. With conditioning, most back problems can be avoided. SEEK IMMEDIATE MEDICAL CARE IF:  You have numbness, tingling, weakness, or problems with the use of your arms or legs.   You experience severe back pain not relieved with medicines.   There is a change in bowel or bladder control.   You have increasing pain in any area of the body, including your belly (abdomen).   You notice shortness of breath, dizziness, or feel faint.   You feel sick to your stomach (nauseous), are throwing up (vomiting), or become sweaty.   You notice discoloration of your toes or legs, or your feet get very cold.   Your back pain is getting worse.   You have an oral temperature above 101 F, not controlled by medicine.  MAKE SURE YOU:   Understand these instructions.   Will watch your condition.   Will get help right away if you are not doing well or get worse.  Document Released: 04/13/2005 Document Re-Released: 09/28/2009 [facility] Patient Information 2011 [place], [place].

## 2011-03-18 LAB — COMPLETE METABOLIC PANEL WITH GFR
ALT: 21 U/L (ref 0–35)
Albumin: 4.7 g/dL (ref 3.5–5.2)
Alkaline Phosphatase: 74 U/L (ref 39–117)
CO2: 26 mEq/L (ref 19–32)
Creat: 0.9 mg/dL (ref 0.50–1.10)
GFR, Est African American: >60 mL/min (ref >60)
GFR, Est Non African American: >60 mL/min (ref >60)
Total Bilirubin: 0.5 mg/dL (ref 0.3–1.2)
Total Protein: 7.8 g/dL (ref 6.0–8.3)

## 2011-03-18 LAB — LIPID PANEL
Cholesterol: 215 mg/dL — ABNORMAL HIGH (ref 0–200)
Total CHOL/HDL Ratio: 4.5 Ratio
Triglycerides: 253 mg/dL — ABNORMAL HIGH (ref <150)
VLDL: 51 mg/dL — ABNORMAL HIGH (ref 0–40)

## 2011-04-18 LAB — DIFFERENTIAL
Basophils Relative: 1
Eosinophils Relative: 3
Monocytes Absolute: 0.6
Neutro Abs: 2.2
Neutrophils Relative %: 42 — ABNORMAL LOW

## 2011-04-18 LAB — POCT CARDIAC MARKERS
CKMB, poc: <1 — ABNORMAL LOW
Myoglobin, poc: 46.5
Myoglobin, poc: 58.1

## 2011-04-18 LAB — POCT I-STAT, CHEM 8
Calcium, Ion: 1.15
Chloride: 110
HCT: 40
Sodium: 141
TCO2: 23

## 2011-04-18 LAB — CBC: Platelets: 262

## 2011-07-04 ENCOUNTER — Emergency Department (HOSPITAL_COMMUNITY)
Admission: EM | Admit: 2011-07-04 | Discharge: 2011-07-04 | Disposition: A | Discharge status: Home / Self Care | Payer: Self-pay | Attending: Emergency Medicine | Admitting: Emergency Medicine

## 2011-07-04 ENCOUNTER — Emergency Department (HOSPITAL_COMMUNITY): Payer: Self-pay

## 2011-07-04 ENCOUNTER — Encounter (HOSPITAL_COMMUNITY): Payer: Self-pay | Admitting: Emergency Medicine

## 2011-07-04 DIAGNOSIS — E785 Hyperlipidemia, unspecified: Secondary | ICD-10-CM | POA: Insufficient documentation

## 2011-07-04 DIAGNOSIS — R070 Pain in throat: Secondary | ICD-10-CM | POA: Insufficient documentation

## 2011-07-04 DIAGNOSIS — Z7982 Long term (current) use of aspirin: Secondary | ICD-10-CM | POA: Insufficient documentation

## 2011-07-04 DIAGNOSIS — I10 Essential (primary) hypertension: Secondary | ICD-10-CM | POA: Insufficient documentation

## 2011-07-04 DIAGNOSIS — Z79899 Other long term (current) drug therapy: Secondary | ICD-10-CM | POA: Insufficient documentation

## 2011-07-04 DIAGNOSIS — R05 Cough: Secondary | ICD-10-CM | POA: Insufficient documentation

## 2011-07-04 DIAGNOSIS — E876 Hypokalemia: Secondary | ICD-10-CM | POA: Insufficient documentation

## 2011-07-04 DIAGNOSIS — R059 Cough, unspecified: Secondary | ICD-10-CM | POA: Insufficient documentation

## 2011-07-04 DIAGNOSIS — R51 Headache: Secondary | ICD-10-CM | POA: Insufficient documentation

## 2011-07-04 DIAGNOSIS — J45909 Unspecified asthma, uncomplicated: Secondary | ICD-10-CM | POA: Insufficient documentation

## 2011-07-04 LAB — POCT I-STAT, CHEM 8
Calcium, Ion: 1.09 mmol/L — ABNORMAL LOW (ref 1.12–1.32)
Chloride: 104 mEq/L (ref 96–112)
Creatinine, Ser: 0.8 mg/dL (ref 0.50–1.10)
Glucose, Bld: 99 mg/dL (ref 70–99)
Potassium: 3.2 mEq/L — ABNORMAL LOW (ref 3.5–5.1)

## 2011-07-04 IMAGING — CT CT HEAD W/O CM
1 of 2 series · 13 of 30 positions shown, 17 images · non-contrast
Comparison: None.

CLINICAL DATA: Headache

CT HEAD WITHOUT CONTRAST
TECHNIQUE: Contiguous axial images were obtained from the base of
the skull through the vertex without contrast.

[Series 2: brain · axial · 0.47mm/px · z∈[+160,+285]mm · 13 of 28 slices shown, 17 images]
[im 2/28  brain]
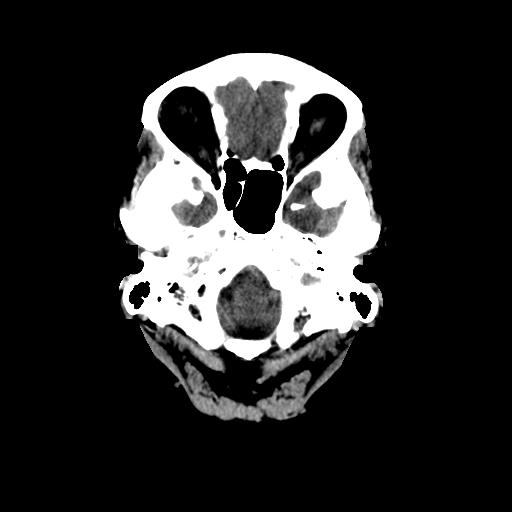
[im 2/28  bone]
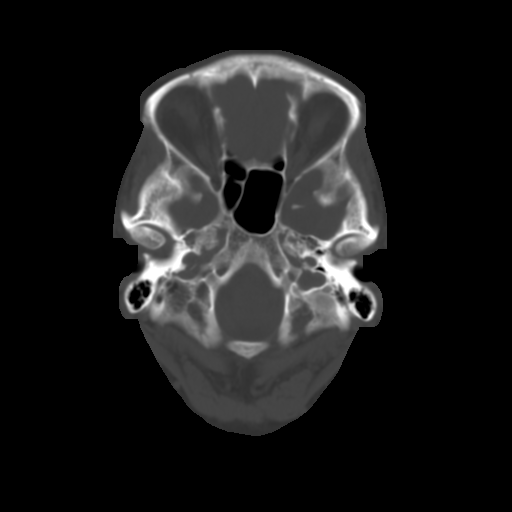
[im 4/28  brain]
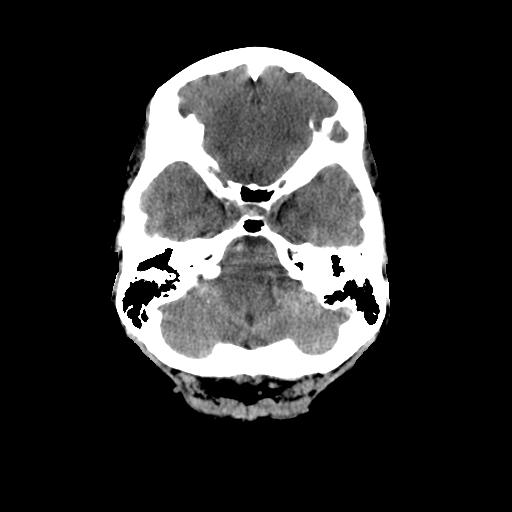
[im 6/28  brain]
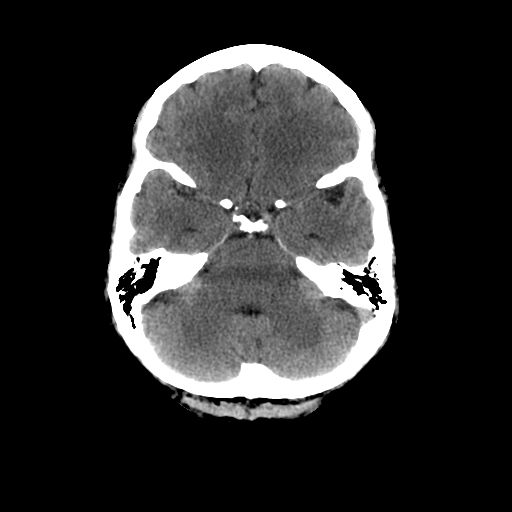
[im 8/28  brain]
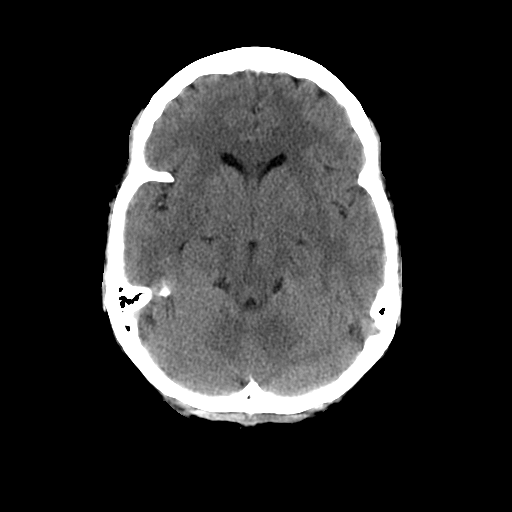
[im 10/28  brain]
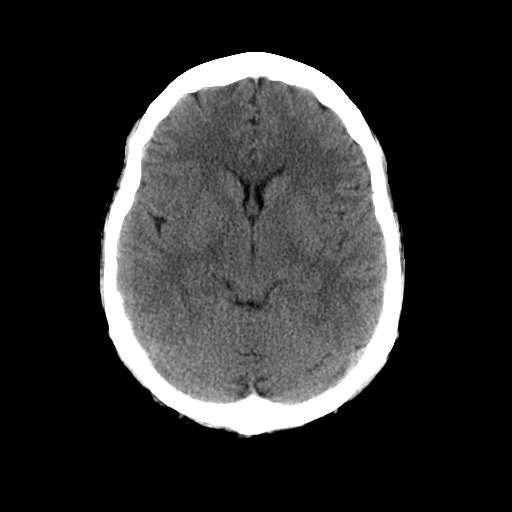
[im 10/28  bone]
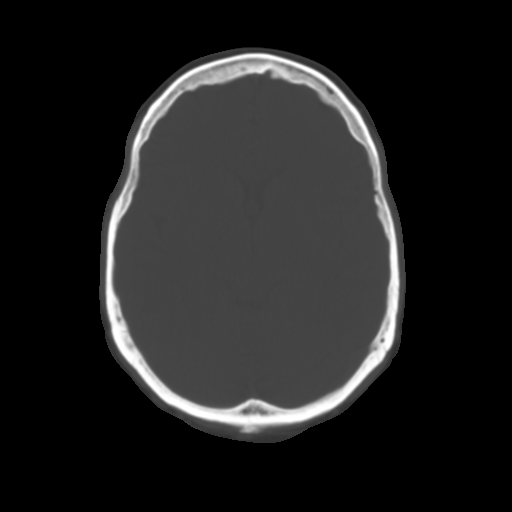
[im 12/28  brain]
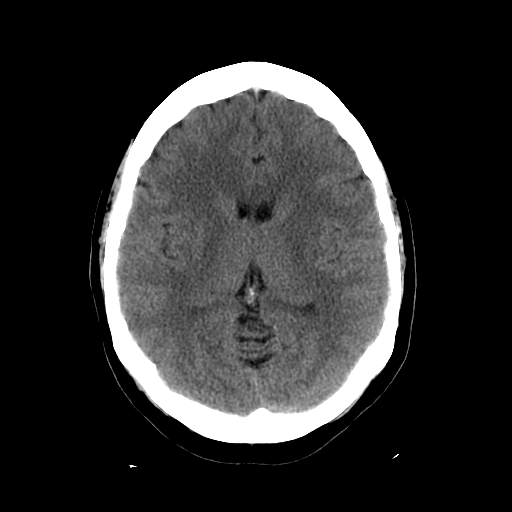
[im 14/28  brain]
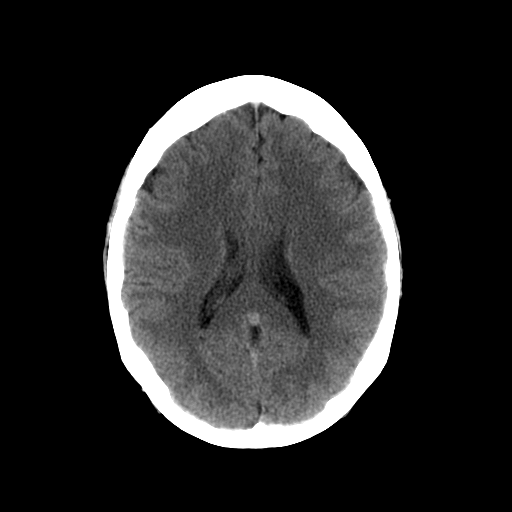
[im 16/28  brain]
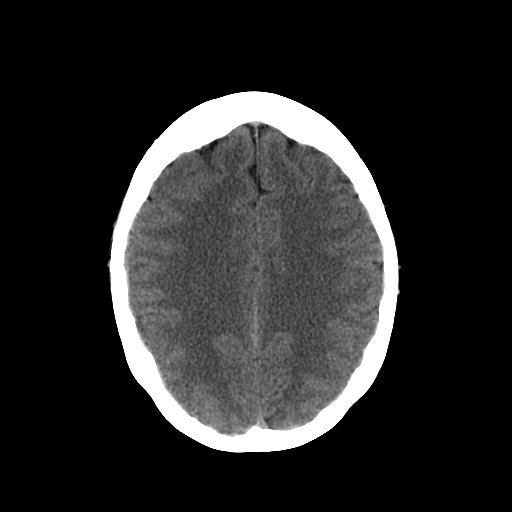
[im 18/28  brain]
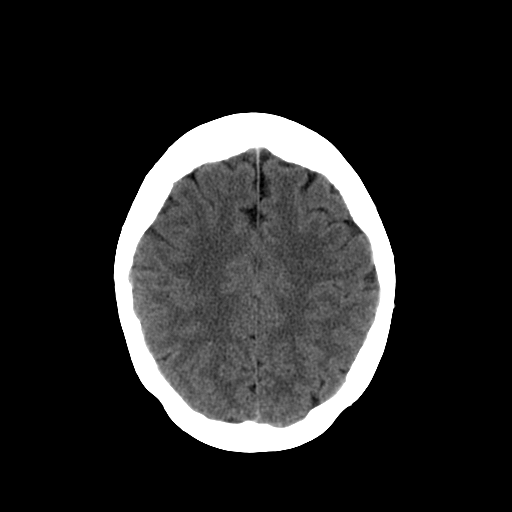
[im 18/28  bone]
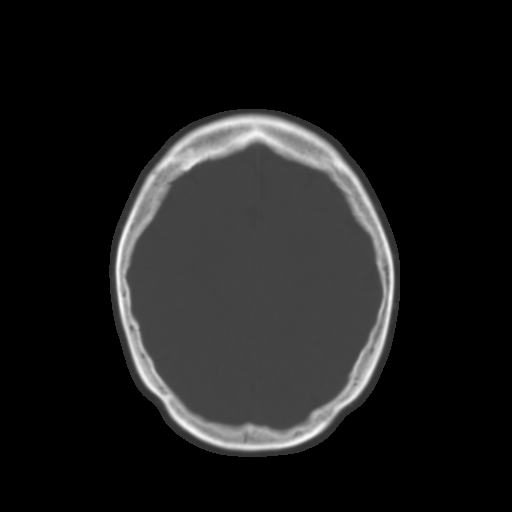
[im 20/28  brain]
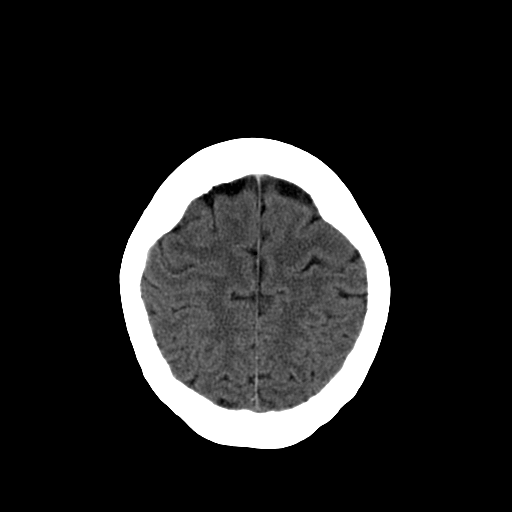
[im 22/28  brain]
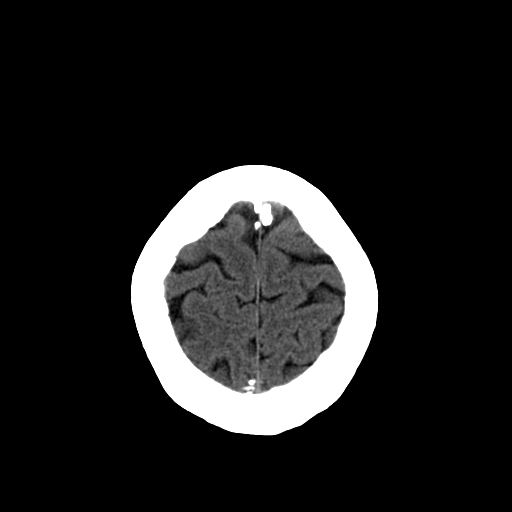
[im 24/28  brain]
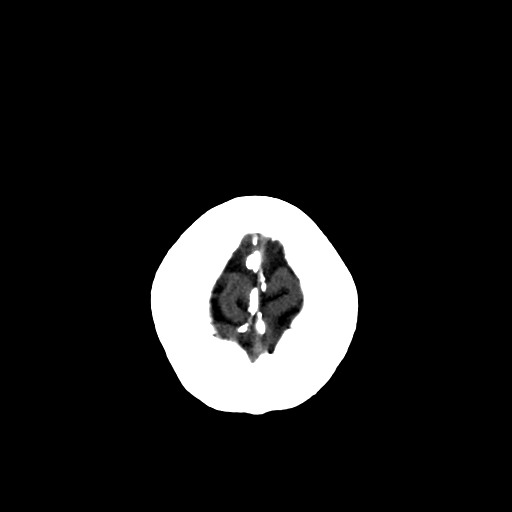
[im 26/28  brain]
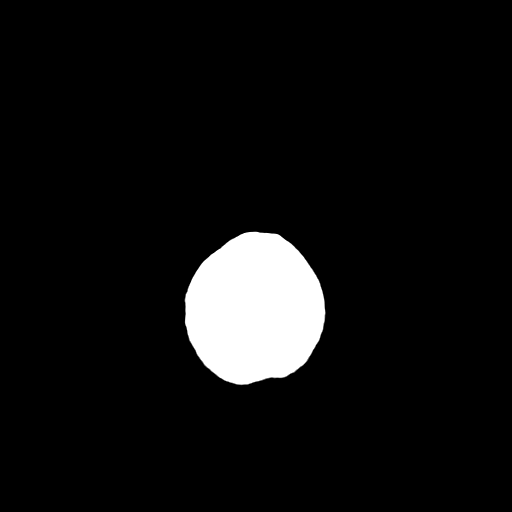
[im 26/28  bone]
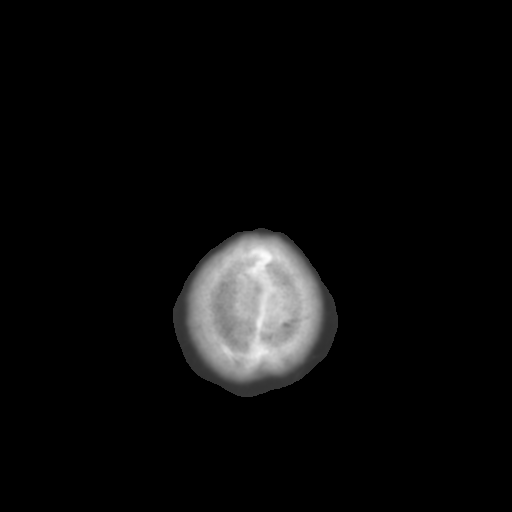

[13 of 30 positions shown; findings below may reference images not displayed]

FINDINGS: There is no evidence for acute hemorrhage, hydrocephalus,
mass lesion, or abnormal extra-axial fluid collection.  No definite
CT evidence for acute infarction.  The visualized paranasal sinuses
and mastoid air cells are predominately clear.
IMPRESSION: No acute intracranial abnormality.

## 2011-07-04 MED ORDER — NAPROXEN 500 MG PO TABS: 500.0000 mg | ORAL_TABLET | Freq: Two times a day (BID) | ORAL | Status: DC

## 2011-07-04 MED ORDER — POTASSIUM CHLORIDE CRYS ER 20 MEQ PO TBCR
40.0000 meq | EXTENDED_RELEASE_TABLET | Freq: Two times a day (BID) | ORAL | Status: DC
  Administered 2011-07-04: 40 meq via ORAL
  Filled 2011-07-04: qty 2

## 2011-07-04 MED ORDER — ACETAMINOPHEN 325 MG PO TABS
975.0000 mg | ORAL_TABLET | Freq: Once | ORAL | Status: AC
  Administered 2011-07-04: 975 mg via ORAL
  Filled 2011-07-04: qty 3

## 2011-07-04 MED ORDER — KETOROLAC TROMETHAMINE 30 MG/ML IJ SOLN
30.0000 mg | Freq: Once | INTRAMUSCULAR | Status: AC
  Administered 2011-07-04: 30 mg via INTRAVENOUS
  Filled 2011-07-04: qty 1

## 2011-07-04 MED ORDER — OXYCODONE-ACETAMINOPHEN 5-325 MG PO TABS: 1.0000 | ORAL_TABLET | ORAL | Status: AC | PRN

## 2011-07-04 MED ORDER — METOCLOPRAMIDE HCL 5 MG/ML IJ SOLN
10.0000 mg | Freq: Once | INTRAMUSCULAR | Status: AC
  Administered 2011-07-04: 10 mg via INTRAVENOUS
  Filled 2011-07-04: qty 2

## 2011-07-04 NOTE — ED Notes (Signed)
Patient states that she has been sick for the past four days (reports cough, cold symptoms, chills, and body aches).  Patient reports that the headache started yesterday; has been having sharp, stabbing pains in the back of her head that has gotten progressively worse this morning (patient states that the pain comes and goes).  Patient states that she was unable to get comfortable when she went to bed last night.  Denies blurry vision, reports numbness in right leg -- states that numbness started last night; numbness goes down posterior leg from hip to calf.  Patient denies any numbness in hands.  Grips are bilaterally equal and strong; foot pushes are bilaterally strong and equal.  Sensation intact bilaterally in upper and lower extremities.  Patient denies chest pain, shortness of breath, nausea, vomiting, and diarrhea.  Upon arrival to room, patient connected to continuous cardiac, pulse ox, and blood pressure monitor.  Dr. Hyacinth Meeker at bedside.  Will continue to monitor.

## 2011-07-04 NOTE — ED Notes (Signed)
Pt back from CT.  Denies any relief from tylenol - states pain still 10/10 and R foot pain continues to be present.  Denies increase in symptoms.

## 2011-07-04 NOTE — ED Notes (Signed)
First meeting with patient. Patient states she has had a headache x 2 days. She is use to having migraines but this is in the back side if her head and she had numbness in the back of her right leg. Family at bedside.

## 2011-07-04 NOTE — ED Notes (Signed)
Patient is resting comfortably. 

## 2011-07-04 NOTE — ED Provider Notes (Addendum)
History     CSN: [id number] Arrival date & time: 07/04/2011  5:08 AM   First MD Initiated Contact with Patient 07/04/11 [phone]      Chief Complaint  Patient presents with  . Headache    (Consider location/radiation/quality/duration/timing/severity/associated sxs/prior treatment) HPI Comments: 60 year old female with a history of hypertension, migraine headaches and asthma who presents with several days of upper respiratory symptoms including congestion, sore throat, intermittent mild cough. She states that since last night she has developed a sharp and stabbing intermittent posterior head and upper neck pain. Nothing makes this better or worse, it is not associated with bright lights, loud sounds or movement. She has no associated nausea vomiting or diarrhea. She has had no medications prior to arrival and has not been able to sleep because of the pain. She states that this is distinctly different than her prior migraine headaches.  Patient is a 60 y.o. female presenting with headaches. The history is provided by the patient and a relative.  Headache     Past Medical History  Diagnosis Date  . Hypertension   . Low back pain   . History of migraine headaches   . Gastrocnemius muscle rupture     H/O partial right calf rupture.  . Menopause syndrome   . Orthostatic hypotension 02/2010    H/O. Pt seen by [facility], who thought likely 2/2 bp meds - dc'ed her Lisinopril/HCTZ  . Asthma   . Hyperlipidemia     History reviewed. No pertinent past surgical history.  Family History  Problem Relation Age of Onset  . Heart disease Mother     "enlarged heart"  . Heart disease Maternal Uncle     Bypass surg  . Heart attack Maternal Grandmother     also had irregular heart beat requriring a pacemaker.  . Hypertension Other     In multiple relatives.     History  Substance Use Topics  . Smoking status: Former Games developer  . Smokeless tobacco: Not on file  . Alcohol Use: No     OB History    Grav Para Term Preterm Abortions TAB SAB Ect Mult Living                  Review of Systems  Neurological: Positive for headaches.  All other systems reviewed and are negative.    Allergies  Penicillins and Sulfonamide derivatives  Home Medications   Current Outpatient Rx  Name Route Sig Dispense Refill  . ACETAMINOPHEN 500 MG PO TABS Oral Take 1,000 mg by mouth every 6 (six) hours as needed. DO NOT EXCEED 8 tablets in 24 hours.     . ALBUTEROL SULFATE HFA 108 (90 BASE) MCG/ACT IN AERS Inhalation Inhale 2 puffs into the lungs. Every 2-4 hours as needed for shortness of breath.     . ASPIRIN 81 MG PO TABS Oral Take 81 mg by mouth daily.      Marland Kitchen HYDROCHLOROTHIAZIDE 25 MG PO TABS Oral Take 1 tablet (25 mg total) by mouth daily. 30 tablet 11  . NAPROXEN 500 MG PO TABS Oral Take 1 tablet (500 mg total) by mouth 2 (two) times daily with a meal. 30 tablet 0  . OXYCODONE-ACETAMINOPHEN 5-325 MG PO TABS Oral Take 1 tablet by mouth every 4 (four) hours as needed for pain. May take 2 tablets PO q 6 hours for severe pain - Do not take with Tylenol as this tablet already contains tylenol 15 tablet 0    BP 148/85  Pulse 71  Temp(Src) 97.9 F (36.6 C) (Oral)  Resp 14  SpO2 96%  LMP 10/07/1990  Physical Exam  Nursing note and vitals reviewed. Constitutional: She appears well-developed and well-nourished. No distress.  HENT:  Head: Normocephalic and atraumatic.  Mouth/Throat: Oropharynx is clear and moist. No oropharyngeal exudate.  Eyes: Conjunctivae and EOM are normal. Pupils are equal, round, and reactive to light. Right eye exhibits no discharge. Left eye exhibits no discharge. No scleral icterus.  Neck: Normal range of motion. Neck supple. No JVD present. No thyromegaly present.  Cardiovascular: Normal rate, regular rhythm, normal heart sounds and intact distal pulses.  Exam reveals no gallop and no friction rub.   No murmur heard. Pulmonary/Chest: Effort normal and  breath sounds normal. No respiratory distress. She has no wheezes. She has no rales.  Abdominal: Soft. Bowel sounds are normal. She exhibits no distension and no mass. There is no tenderness.  Musculoskeletal: Normal range of motion. She exhibits no edema and no tenderness.  Lymphadenopathy:    She has no cervical adenopathy.  Neurological: She is alert. Coordination normal.       Neurologic exam:  Speech clear, pupils equal round reactive to light, extraocular movements intact  Normal peripheral visual fields Cranial nerves III through XII normal including no facial droop Follows commands, moves all extremities x4, normal strength to bilateral upper and lower extremities at all major muscle groups including grip Sensation normal to light touch and pinprick Coordination intact, no limb ataxia, finger-nose-finger normal Rapid alternating movements normal No pronator drift Gait normal   Skin: Skin is warm and dry. No rash noted. No erythema.  Psychiatric: She has a normal mood and affect. Her behavior is normal.    ED Course  Procedures (including critical care time)  Labs Reviewed  POCT I-STAT, CHEM 8 - Abnormal; Notable for the following:    Potassium 3.2 (*)    Calcium, Ion 1.09 (*)    All other components within normal limits  I-STAT, CHEM 8   Ct Head Wo Contrast  07/04/2011  *RADIOLOGY REPORT*  Clinical Data: Headache  CT HEAD WITHOUT CONTRAST  Technique:  Contiguous axial images were obtained from the base of the skull through the vertex without contrast.  Comparison: None.  Findings: There is no evidence for acute hemorrhage, hydrocephalus, mass lesion, or abnormal extra-axial fluid collection.  No definite CT evidence for acute infarction.  The visualized paranasal sinuses and mastoid air cells are predominately clear.  IMPRESSION: No acute intracranial abnormality.  Original Report Authenticated By: Waneta Martins, M.D.    1. Headache   2. Hypokalemia       MDM    Exam is well-appearing with no focal neurologic findings. With significant headache, new in location and quality and severity, will order CT scan to rule out intracranial hemorrhage or mass. Acetaminophen ordered initially.  Tyelnol with no improvement - Toradol / Reglan ordered now that has neg CT.  Potassium level low, repleted with oral potassium. Patient given discharge instructions including indications for return and has expressed her understanding.  HA significantly improved since meds.    Vida Roller, MD 07/04/11 [id number]  Vida Roller, MD 07/04/11 [phone]

## 2011-07-04 NOTE — ED Notes (Signed)
Pt with hx of migranes to ED c/o occipital headache x 3 days.  She has a hx of stroke in FAMILY and is worried she is having a stroke b/c this ha does not feel like her typical headaches b/c it is occiptal and she is not photophobic or nauseated.  Tonight her R foot became numb.    For the past 3 days she has also been sneezing and coughing.  Per her husband he is having the same symptoms.    No facial droop.  Equal strength in all extremities.

## 2011-11-01 ENCOUNTER — Encounter: Payer: Self-pay | Admitting: Internal Medicine

## 2011-12-28 ENCOUNTER — Encounter: Payer: Self-pay | Admitting: Internal Medicine

## 2011-12-28 ENCOUNTER — Ambulatory Visit (INDEPENDENT_AMBULATORY_CARE_PROVIDER_SITE_OTHER): Payer: Self-pay | Admitting: Internal Medicine

## 2011-12-28 VITALS — BP 177/89 | HR 77 | Temp 96.7°F | Ht 66.0 in | Wt 174.2 lb

## 2011-12-28 DIAGNOSIS — M79605 Pain in left leg: Secondary | ICD-10-CM | POA: Insufficient documentation

## 2011-12-28 DIAGNOSIS — J45909 Unspecified asthma, uncomplicated: Secondary | ICD-10-CM

## 2011-12-28 DIAGNOSIS — E785 Hyperlipidemia, unspecified: Secondary | ICD-10-CM

## 2011-12-28 DIAGNOSIS — M79609 Pain in unspecified limb: Secondary | ICD-10-CM

## 2011-12-28 DIAGNOSIS — I10 Essential (primary) hypertension: Secondary | ICD-10-CM

## 2011-12-28 LAB — D-DIMER, QUANTITATIVE: D-Dimer, Quant: 0.43 ug/mL-FEU (ref 0.00–0.48)

## 2011-12-28 LAB — BASIC METABOLIC PANEL WITH GFR
Calcium: 9.4 mg/dL (ref 8.4–10.5)
GFR, Est African American: 87 mL/min
GFR, Est Non African American: 75 mL/min
Sodium: 140 mEq/L (ref 135–145)

## 2011-12-28 LAB — LIPID PANEL
HDL: 46 mg/dL (ref >39)
LDL Cholesterol: 122 mg/dL — ABNORMAL HIGH (ref 0–99)
Total CHOL/HDL Ratio: 4.1 Ratio
VLDL: 22 mg/dL (ref 0–40)

## 2011-12-28 MED ORDER — ALBUTEROL SULFATE HFA 108 (90 BASE) MCG/ACT IN AERS: 2.0000 | INHALATION_SPRAY | RESPIRATORY_TRACT | Status: DC | PRN

## 2011-12-28 MED ORDER — LISINOPRIL-HYDROCHLOROTHIAZIDE 10-12.5 MG PO TABS: 1.0000 | ORAL_TABLET | Freq: Every day | ORAL | Status: DC

## 2011-12-28 NOTE — Assessment & Plan Note (Signed)
Well-controlled and patient only rarely takes albuterol. Prescription refilled today.

## 2011-12-28 NOTE — Assessment & Plan Note (Signed)
Lipid panel checked today. 10 year risk of myocardial infarction or coronary death was calculated using today's lipid values. Her risk is less than 4%. Therefore her current LDL of 122 is acceptable to treat with lifestyle modifications at this time. Even if her 10 year risk was inattentive 20% range we not initiate therapy until LDL was above 1:30. Will encourage exercise and diet modifications at this time.

## 2011-12-28 NOTE — Patient Instructions (Addendum)
Please begin taking your new blood pressure medicine daily. We will need you to come back in one week to check your kidney function and potassium level.  Your leg pain is likely due to to a muscle strain. We have ordered a test to rule out the possibility of a blood clot. I will call you to tell you the results of this test. If you develop weakness or loss of sensation, lose control of your bladder or bowel function, developed fevers, chills or begin to lose weight please call and be seen in the clinic immediately. You can take over-the-counter naproxen 220 mg twice daily with food for your leg pain. Continue to use ice and heat as necessary. If you're not feeling better in 2 weeks, please call our clinic to be referred to physical therapy. He should not need an appointment in our clinic for this referral.   The proper technique for using your inhaler is as follows: Take a deep breath in and exhale completely. With your next deep breath in, activate your inhaler and inhale the medication as deeply as you can. Hold this breath in for 7 full seconds. This counts as one puff.  If any of your lab results are abnormal we will contact you by phone or send you a letter. If they are normal, we will not contact you, but will be happy to discuss them at your next clinic appointment.  Return to clinic to see Dr. Dierdre Searles in 6 months. Please bring all your medications to your next clinic appointment.     Musculoskeletal Pain  HOME CARE INSTRUCTIONS   Ask when your test results will be ready. Make sure you get your test results.   Only take over-the-counter or prescription medicines for pain, discomfort, or fever as directed by your caregiver.  Continue all activities unless the activities cause more pain. When the pain lessens, slowly resume normal activities. Gradually increase the intensity and duration of the activities or exercise.   During periods of severe pain, bed rest may be helpful. Lay or sit in any  position that is comfortable.   Putting ice on the injured area.   Put ice in a bag.   Place a towel between your skin and the bag.   Leave the ice on for 15 to 20 minutes, 3 to 4 times a day.   Follow up with your caregiver for continued problems and no reason can be found for the pain. If the pain becomes worse or does not go away, it may be necessary to repeat tests or do additional testing. Your caregiver may need to look further for a possible cause.  SEEK IMMEDIATE MEDICAL CARE IF:  You have pain that is getting worse and is not relieved by medications.   You develop chest pain that is associated with shortness or breath, sweating, feeling sick to your stomach (nauseous), or throw up (vomit).   Your pain becomes localized to the abdomen.   You develop any new symptoms that seem different or that concern you.  MAKE SURE YOU:   Understand these instructions.   Will watch your condition.   Will get help right away if you are not doing well or get worse.  Document Released: 07/04/2005 Document Revised: 06/23/2011 Document Reviewed: 02/22/2008 [facility] Patient Information 2012 [place], [place].

## 2011-12-28 NOTE — Progress Notes (Signed)
Subjective:     Patient ID: Katherine Haynes, female   DOB: [birth year], 61 y.o.   MRN: [id number]  Ms. Katherine Haynes is a very pleasant 61 year old woman past medical history significant for hypertension, hyperlipidemia and low back pain. She presents for evaluation of her back pain and for refill of her medications HPI Comments: Denies any cough, hemoptysis, shortness of breath, recent periods of inability, tachycardia, history of cancer or surgery. She states her mother had lung clots and needed to take blood thinners. She is worried about a blood clot.  She tells me that she has not taken any of her blood pressure medicines for the past 3 months.  Leg Pain  Incident onset: Pain has been present for approximately one month. There was no injury mechanism. The pain is present in the left leg (Pain is worst in the left posterior calf and less severe in left posterior thigh). The quality of the pain is described as aching (Denies any feeling of muscle cramping. Denies any prior episodes of pain. There is no radiation.). The pain is at a severity of 7/10. The pain is moderate. The pain has been constant (Pain is generally tolerable during the day however it does peak at night.) since onset. Pertinent negatives include no inability to bear weight, loss of motion, loss of sensation, muscle weakness, numbness or tingling. Associated symptoms comments: States she has mild bilateral lower extremity swelling since she has discontinued her HCTZ. This is a chronic problem. Denies fevers, chills, weight loss, unilateral swelling, focal tenderness or erythema.. Exacerbated by: Bending over. She has tried heat and NSAIDs for the symptoms. The treatment provided significant (Pain goes from 7 down to a 3/10 with ibuprofen) relief.     Review of Systems  Constitutional: Negative for fever, chills, diaphoresis, fatigue and unexpected weight change.  Respiratory: Negative for chest tightness, shortness of breath and wheezing.     Cardiovascular: Positive for leg swelling. Negative for chest pain and palpitations.  Gastrointestinal: Negative for nausea, vomiting, abdominal pain, diarrhea, constipation, blood in stool, abdominal distention and anal bleeding.  Genitourinary: Negative for dysuria, hematuria and difficulty urinating.  Musculoskeletal: Positive for myalgias. Negative for joint swelling and arthralgias.  Skin: Negative for color change and rash.  Neurological: Negative for dizziness, tingling and numbness.       Objective:   Physical Exam  Constitutional: She appears well-developed and well-nourished. No distress.  Cardiovascular: Normal rate, regular rhythm and intact distal pulses.   No murmur heard. Pulmonary/Chest: Effort normal and breath sounds normal. No respiratory distress. She has no wheezes. She has no rales.  Abdominal: Soft. Bowel sounds are normal.  Musculoskeletal: She exhibits edema. She exhibits no tenderness.       Right lower leg: She exhibits edema.       Left lower leg: She exhibits tenderness and edema. She exhibits no bony tenderness, no swelling, no deformity and no laceration.       Very mild left lower extremity tenderness on palpation.  Trace bilateral nonpitting edema.  Neurological: She has normal strength. She displays no atrophy, no tremor and normal reflexes. No sensory deficit. She exhibits normal muscle tone. She displays no Babinski's sign on the right side. She displays no Babinski's sign on the left side.  Reflex Scores:      Patellar reflexes are 1+ on the right side and 2+ on the left side.      Achilles reflexes are 2+ on the right side and 2+ on the left side. Skin:  Skin is warm and dry. No rash noted. No erythema.  Psychiatric: She has a normal mood and affect.    Results for orders placed in visit on 12/28/11 (from the past 24 hour(s))  LIPID PANEL     Status: Abnormal   Collection Time   12/28/11 12:30 PM      Component Value Range   Cholesterol 190  0 -  200 mg/dL   Triglycerides 161  <096 mg/dL   HDL 46  >04 mg/dL   Total CHOL/HDL Ratio 4.1     VLDL 22  0 - 40 mg/dL   LDL Cholesterol 540 (*) 0 - 99 mg/dL   Narrative:    Performed at:  [facility]                [address]                [place], [place] [address]  BASIC METABOLIC PANEL WITH GFR     Status: Abnormal   Collection Time   12/28/11 12:30 PM      Component Value Range   Sodium 140  135 - 145 mEq/L   Potassium 3.9  3.5 - 5.3 mEq/L   Chloride 103  96 - 112 mEq/L   CO2 28  19 - 32 mEq/L   Glucose, Bld 100 (*) 70 - 99 mg/dL   BUN 16  6 - 23 mg/dL   Creat 8.29  5.62 - 1.30 mg/dL   Calcium 9.4  8.4 - 86.5 mg/dL   GFR, Est African American 87     GFR, Est Non African American 75     Narrative:    STAT  D-DIMER, QUANTITATIVE     Status: Normal   Collection Time   12/28/11 12:30 PM      Component Value Range   D-Dimer, Quant 0.43  0.00 - 0.48 ug/mL-FEU   Narrative:    Performed at:  [facility]                [address] [place], [place] [address]       Assessment:     Case discussed with Dr. Aundria Rud. Please see problem oriented charting for assessment and plan by problem (best viewed under encounters tab).

## 2011-12-28 NOTE — Assessment & Plan Note (Signed)
Ms. Katherine Haynes blood pressure is entirely uncontrolled today as she has been out of her medicines for months. She has stage II hypertension and warrants treatment with 2 antihypertensives. As she has had some hypokalemia with HCTZ only therapy, we will treat her with combination pill of lisinopril-HCTZ 10-12.5 mg daily. We checked basic metabolic panel today, which was unremarkable. Her creatinine is 0.85. She will also resume her 81 mg aspirin daily.

## 2011-12-28 NOTE — Assessment & Plan Note (Addendum)
Pain is most likely musculoskeletal in origin. No significant risk factors, or signs of DVT. No cough hemoptysis tachycardia fevers or chest pain that would be consistent with PE. However, as her pain is unilateral and has a family history of blood clots, we checked a d-dimer today which was negative. Her pretest probability is very low therefore a negative d-dimer will effectively rule out DVT.  I called the patient with these results. Will advise patient to take naproxen 220 mg twice a day CC. Also advised heat and ice as necessary. If she is not improved in 2 weeks she is to call our clinic. At that time we will provide referral to physical therapy over the phone. She does not require a physician's visit unless she develops new symptoms.  She was counseled that if she develops weakness or loss of sensation, loss of control of bladder or bowel function, developed fevers, chills or begin to lose weight, she is to be seen in the clinic immediately.

## 2012-02-27 NOTE — Addendum Note (Signed)
Addended by: Remus Blake on: 02/27/2012 09:37 AM   Modules accepted: Orders

## 2012-08-23 ENCOUNTER — Emergency Department (HOSPITAL_COMMUNITY)
Admission: EM | Admit: 2012-08-23 | Discharge: 2012-08-23 | Disposition: A | Discharge status: Home / Self Care | Payer: Self-pay | Attending: Emergency Medicine | Admitting: Emergency Medicine

## 2012-08-23 ENCOUNTER — Encounter (HOSPITAL_COMMUNITY): Payer: Self-pay | Admitting: Emergency Medicine

## 2012-08-23 DIAGNOSIS — R21 Rash and other nonspecific skin eruption: Secondary | ICD-10-CM | POA: Insufficient documentation

## 2012-08-23 DIAGNOSIS — Z8679 Personal history of other diseases of the circulatory system: Secondary | ICD-10-CM | POA: Insufficient documentation

## 2012-08-23 DIAGNOSIS — E785 Hyperlipidemia, unspecified: Secondary | ICD-10-CM | POA: Insufficient documentation

## 2012-08-23 DIAGNOSIS — Z8739 Personal history of other diseases of the musculoskeletal system and connective tissue: Secondary | ICD-10-CM | POA: Insufficient documentation

## 2012-08-23 DIAGNOSIS — L299 Pruritus, unspecified: Secondary | ICD-10-CM | POA: Insufficient documentation

## 2012-08-23 DIAGNOSIS — R51 Headache: Secondary | ICD-10-CM | POA: Insufficient documentation

## 2012-08-23 DIAGNOSIS — Z79899 Other long term (current) drug therapy: Secondary | ICD-10-CM | POA: Insufficient documentation

## 2012-08-23 DIAGNOSIS — Z7982 Long term (current) use of aspirin: Secondary | ICD-10-CM | POA: Insufficient documentation

## 2012-08-23 DIAGNOSIS — IMO0002 Reserved for concepts with insufficient information to code with codable children: Secondary | ICD-10-CM | POA: Insufficient documentation

## 2012-08-23 DIAGNOSIS — Z87828 Personal history of other (healed) physical injury and trauma: Secondary | ICD-10-CM | POA: Insufficient documentation

## 2012-08-23 DIAGNOSIS — I10 Essential (primary) hypertension: Secondary | ICD-10-CM | POA: Insufficient documentation

## 2012-08-23 DIAGNOSIS — Z8742 Personal history of other diseases of the female genital tract: Secondary | ICD-10-CM | POA: Insufficient documentation

## 2012-08-23 DIAGNOSIS — R220 Localized swelling, mass and lump, head: Secondary | ICD-10-CM | POA: Insufficient documentation

## 2012-08-23 DIAGNOSIS — J45909 Unspecified asthma, uncomplicated: Secondary | ICD-10-CM | POA: Insufficient documentation

## 2012-08-23 MED ORDER — DEXAMETHASONE SODIUM PHOSPHATE 10 MG/ML IJ SOLN
10.0000 mg | Freq: Once | INTRAMUSCULAR | Status: AC
  Administered 2012-08-23: 10 mg via INTRAMUSCULAR
  Filled 2012-08-23: qty 1

## 2012-08-23 MED ORDER — HYDROXYZINE HCL 25 MG PO TABS: 25.0000 mg | ORAL_TABLET | Freq: Four times a day (QID) | ORAL | Status: DC

## 2012-08-23 MED ORDER — PREDNISONE 20 MG PO TABS: ORAL_TABLET | ORAL | Status: DC

## 2012-08-23 NOTE — Progress Notes (Signed)
During 08/23/12 ED visit pt noted without pcp nor insurance coverage.  Pt referred to [facility] for community care liaison who provided pt with information on [place] self pay pcps and health reform information Pt also assisted to obtain an application and appointment for "orange card"

## 2012-08-23 NOTE — ED Notes (Signed)
Associate Professor provided pt with resources for financial assistance and medical care through social service

## 2012-08-23 NOTE — ED Provider Notes (Signed)
Medical screening examination/treatment/procedure(s) were performed by non-physician practitioner and as supervising physician I was immediately available for consultation/collaboration.  Ethelda Chick, MD 08/23/12 [phone]

## 2012-08-23 NOTE — ED Notes (Signed)
Facial swelling noted around r/eye, spreading to l/eye. Pt reports pain and itching. Rash stated 4 days ago

## 2012-08-23 NOTE — ED Provider Notes (Signed)
History     CSN: [id number]  Arrival date & time 08/23/12  1255   First MD Initiated Contact with Patient 08/23/12 1258      Chief Complaint  Patient presents with  . Rash    facial rash , spreading from r/side of face to left  . Pruritis  . Facial Pain  . Facial Swelling    4 day hx of facial swelling, increasing daily    (Consider location/radiation/quality/duration/timing/severity/associated sxs/prior treatment) HPI 62 year old female presents to emergency department with chief complaint of right-sided facial rash.  Onset 4 days ago.  Patient describes intense pruritus.  She has some involvement of the right upper eyelid.  She denies any difficulty seeing.  She denies any involvement of the lips, or throat.  She denies any chest tightness or wheezing. Denies contacts with similar,changes in lotions/soaps/detergents, exposure to animal or plant irritants, and denies purulent discharge.  Past Medical History  Diagnosis Date  . Hypertension   . Low back pain   . History of migraine headaches   . Gastrocnemius muscle rupture     H/O partial right calf rupture.  . Menopause syndrome   . Orthostatic hypotension 02/2010    H/O. Pt seen by [facility], who thought likely 2/2 bp meds - dc'ed her Lisinopril/HCTZ  . Asthma   . Hyperlipidemia     Past Surgical History  Procedure Date  . Tonsillectomy   . Tubal ligation   . Mouth surgery     Family History  Problem Relation Age of Onset  . Heart disease Mother     "enlarged heart"  . Heart disease Maternal Uncle     Bypass surg  . Heart attack Maternal Grandmother     also had irregular heart beat requriring a pacemaker.  . Hypertension Other     In multiple relatives.     History  Substance Use Topics  . Smoking status: Former Games developer  . Smokeless tobacco: Not on file  . Alcohol Use: No    OB History    Grav Para Term Preterm Abortions TAB SAB Ect Mult Living                  Review of Systems Ten  systems reviewed and are negative for acute change, except as noted in the HPI.   Allergies  Penicillins and Sulfonamide derivatives  Home Medications   Current Outpatient Rx  Name  Route  Sig  Dispense  Refill  . ALBUTEROL SULFATE HFA 108 (90 BASE) MCG/ACT IN AERS   Inhalation   Inhale 2 puffs into the lungs every 4 (four) hours as needed for wheezing.   1 Inhaler   6   . ASPIRIN EC 81 MG PO TBEC   Oral   Take 81 mg by mouth daily.         Marland Kitchen HYDROXYZINE HCL 25 MG PO TABS   Oral   Take 1 tablet (25 mg total) by mouth every 6 (six) hours.   12 tablet   0   . PREDNISONE 20 MG PO TABS      3 tabs po day one, then 2 po daily x 4 days   11 tablet   0     BP 183/99  Pulse 79  Temp 97.9 F (36.6 C) (Oral)  Resp 18  SpO2 99%  LMP 10/07/1990  Physical Exam  Physical Exam  Nursing note and vitals reviewed. Constitutional: She is oriented to person, place, and time. She appears well-developed  and well-nourished. No distress.  HENT:  Head: Normocephalic and atraumatic.  Eyes: Conjunctivae normal and EOM are normal. Pupils are equal, round, and reactive to light. No scleral icterus.  EOMI PERRLA Neck: Normal range of motion.  Mouth: No swelling of the lips, tongue, or pharynx.  Uvula midline airway patent Cardiovascular: Normal rate, regular rhythm and normal heart sounds.  Exam reveals no gallop and no friction rub.   No murmur heard. Pulmonary/Chest: Effort normal and breath sounds normal. No respiratory distress.  Abdominal: Soft. Bowel sounds are normal. She exhibits no distension and no mass. There is no tenderness. There is no guarding.  Neurological: She is alert and oriented to person, place, and time.  Skin: Skin is warm and dry. She is not diaphoretic.  Punctate, maculopapular, erythematous rash of the right temporal and facial region extending toward the eyes.  There is a small triangular burn to 2 your iron.  Some swelling of the right lid of the eye.    ED  Course  Procedures (including critical care time)  Labs Reviewed - No data to display No results found.   1. Rash of face       MDM  2:26 PM BP 183/99  Pulse 79  Temp 97.9 F (36.6 C) (Oral)  Resp 18  SpO2 99%  LMP 10/07/1990 Patient with what appears to be allergic rash of the face.  She's been treated here with Decadron and will begin a five-day taper of prednisone on Sunday.  Patient wanted to be discharged with Atarax.  Patient is advised to followup if swelling increases or there is involvement of the lips tongue or throat.  At this time there does not appear to be any evidence of an acute emergency medical condition and the patient appears stable for discharge with appropriate outpatient follow up.Diagnosis was discussed with patient who verbalizes understanding and is agreeable to discharge.  Arthor Captain, PA-C 08/23/12 1427

## 2012-10-16 ENCOUNTER — Encounter: Payer: Self-pay | Admitting: Internal Medicine

## 2012-11-22 ENCOUNTER — Encounter: Payer: Self-pay | Admitting: Internal Medicine

## 2012-11-22 ENCOUNTER — Ambulatory Visit (INDEPENDENT_AMBULATORY_CARE_PROVIDER_SITE_OTHER): Payer: Self-pay | Admitting: Internal Medicine

## 2012-11-22 VITALS — BP 175/95 | HR 80 | Temp 98.2°F | Ht 66.0 in | Wt 184.6 lb

## 2012-11-22 DIAGNOSIS — I10 Essential (primary) hypertension: Secondary | ICD-10-CM

## 2012-11-22 DIAGNOSIS — J309 Allergic rhinitis, unspecified: Secondary | ICD-10-CM

## 2012-11-22 DIAGNOSIS — J302 Other seasonal allergic rhinitis: Secondary | ICD-10-CM

## 2012-11-22 LAB — LIPID PANEL
Cholesterol: 218 mg/dL — ABNORMAL HIGH (ref 0–200)
LDL Cholesterol: 135 mg/dL — ABNORMAL HIGH (ref 0–99)
Triglycerides: 158 mg/dL — ABNORMAL HIGH (ref <150)
VLDL: 32 mg/dL (ref 0–40)

## 2012-11-22 LAB — COMPLETE METABOLIC PANEL WITH GFR
Albumin: 3.9 g/dL (ref 3.5–5.2)
Calcium: 9.4 mg/dL (ref 8.4–10.5)
GFR, Est African American: 82 mL/min
Glucose, Bld: 94 mg/dL (ref 70–99)
Sodium: 136 mEq/L (ref 135–145)
Total Bilirubin: 0.4 mg/dL (ref 0.3–1.2)

## 2012-11-22 LAB — CBC
HCT: 40.3 % (ref 36.0–46.0)
Hemoglobin: 13.7 g/dL (ref 12.0–15.0)
MCH: 30.5 pg (ref 26.0–34.0)
MCHC: 34 g/dL (ref 30.0–36.0)
RBC: 4.49 MIL/uL (ref 3.87–5.11)

## 2012-11-22 LAB — POCT GLYCOSYLATED HEMOGLOBIN (HGB A1C): Hemoglobin A1C: 6

## 2012-11-22 MED ORDER — HYDROCHLOROTHIAZIDE 25 MG PO TABS: 25.0000 mg | ORAL_TABLET | Freq: Every day | ORAL | Status: DC

## 2012-11-22 MED ORDER — CETIRIZINE HCL 10 MG PO TABS: 10.0000 mg | ORAL_TABLET | Freq: Every day | ORAL | Status: DC

## 2012-11-22 NOTE — Patient Instructions (Addendum)
General Instructions:  1. Will start HCTZ 25 mg po daily 2. Follow up in one month   Hypertension As your heart beats, it forces blood through your arteries. This force is your blood pressure. If the pressure is too high, it is called hypertension (HTN) or high blood pressure. HTN is dangerous because you may have it and not know it. High blood pressure may mean that your heart has to work harder to pump blood. Your arteries may be narrow or stiff. The extra work puts you at risk for heart disease, stroke, and other problems.  Blood pressure consists of two numbers, a higher number over a lower, 110/72, for example. It is stated as "110 over 72." The ideal is below 120 for the top number (systolic) and under 80 for the bottom (diastolic). Write down your blood pressure today. You should pay close attention to your blood pressure if you have certain conditions such as:  Heart failure.  Prior heart attack.  Diabetes  Chronic kidney disease.  Prior stroke.  Multiple risk factors for heart disease. To see if you have HTN, your blood pressure should be measured while you are seated with your arm held at the level of the heart. It should be measured at least twice. A one-time elevated blood pressure reading (especially in the Emergency Department) does not mean that you need treatment. There may be conditions in which the blood pressure is different between your right and left arms. It is important to see your caregiver soon for a recheck. Most people have essential hypertension which means that there is not a specific cause. This type of high blood pressure may be lowered by changing lifestyle factors such as:  Stress.  Smoking.  Lack of exercise.  Excessive weight.  Drug/tobacco/alcohol use.  Eating less salt. Most people do not have symptoms from high blood pressure until it has caused damage to the body. Effective treatment can often prevent, delay or reduce that damage. TREATMENT    When a cause has been identified, treatment for high blood pressure is directed at the cause. There are a large number of medications to treat HTN. These fall into several categories, and your caregiver will help you select the medicines that are best for you. Medications may have side effects. You should review side effects with your caregiver. If your blood pressure stays high after you have made lifestyle changes or started on medicines,   Your medication(s) may need to be changed.  Other problems may need to be addressed.  Be certain you understand your prescriptions, and know how and when to take your medicine.  Be sure to follow up with your caregiver within the time frame advised (usually within two weeks) to have your blood pressure rechecked and to review your medications.  If you are taking more than one medicine to lower your blood pressure, make sure you know how and at what times they should be taken. Taking two medicines at the same time can result in blood pressure that is too low. SEEK IMMEDIATE MEDICAL CARE IF:  You develop a severe headache, blurred or changing vision, or confusion.  You have unusual weakness or numbness, or a faint feeling.  You have severe chest or abdominal pain, vomiting, or breathing problems. MAKE SURE YOU:   Understand these instructions.  Will watch your condition.  Will get help right away if you are not doing well or get worse. Document Released: 07/04/2005 Document Revised: 09/26/2011 Document Reviewed: 02/22/2008 [employer] Patient  Information 2013 [place], [place].   Treatment Goals:  Goals (1 Years of Data) as of 11/22/12         As of Today 08/23/12 08/23/12 08/23/12 12/28/11     Blood Pressure    . Blood Pressure < 140/90  175/95 182/92 206/92 183/99 177/89     Result Component    . LDL CALC < 130      122      Progress Toward Treatment Goals:  Treatment Goal 11/22/2012  Blood pressure unchanged    Self Care Goals &  Plans:  Self Care Goal 11/22/2012  Manage my medications take my medicines as prescribed; refill my medications on time; bring my medications to every visit  Monitor my health keep track of my blood pressure; keep track of my weight  Eat healthy foods eat more vegetables; drink diet soda or water instead of juice or soda; eat foods that are low in salt  Be physically active park at the far end of the parking lot; take a walk every day; find an activity I enjoy       Care Management & Community Referrals:  Referral 11/22/2012  Referrals made for care management support nutritionist  Referrals made to community resources weight management

## 2012-11-22 NOTE — Progress Notes (Signed)
Subjective:   Patient ID: Katherine Haynes female   DOB: [birth year] 62 y.o.   MRN: [id number]  HPI: Katherine Haynes is a 62 y.o. woman with PMH of HTN and HLD, who presents to the clinic for follow up visit. She tells me that she has not taken any of her blood pressure medicines for the past few months. She is here for medication refill and treatment of allergic rhinitis.     Past Medical History  Diagnosis Date  . Hypertension   . Low back pain   . History of migraine headaches   . Gastrocnemius muscle rupture     H/O partial right calf rupture.  . Menopause syndrome   . Orthostatic hypotension 02/2010    H/O. Pt seen by [facility], who thought likely 2/2 bp meds - dc'ed her Lisinopril/HCTZ  . Asthma   . Hyperlipidemia    Current Outpatient Prescriptions  Medication Sig Dispense Refill  . albuterol (VENTOLIN HFA) 108 (90 BASE) MCG/ACT inhaler Inhale 2 puffs into the lungs every 4 (four) hours as needed for wheezing.  1 Inhaler  6  . aspirin EC 81 MG tablet Take 81 mg by mouth daily.      . cetirizine (ZYRTEC) 10 MG tablet Take 1 tablet (10 mg total) by mouth daily.  30 tablet  3  . hydrochlorothiazide (HYDRODIURIL) 25 MG tablet Take 1 tablet (25 mg total) by mouth daily.  30 tablet  1  . hydrOXYzine (ATARAX/VISTARIL) 25 MG tablet Take 1 tablet (25 mg total) by mouth every 6 (six) hours.  12 tablet  0  . predniSONE (DELTASONE) 20 MG tablet 3 tabs po day one, then 2 po daily x 4 days  11 tablet  0   No current facility-administered medications for this visit.   Family History  Problem Relation Age of Onset  . Heart disease Mother     "enlarged heart"  . Heart disease Maternal Uncle     Bypass surg  . Heart attack Maternal Grandmother     also had irregular heart beat requriring a pacemaker.  . Hypertension Other     In multiple relatives.    History   Social History  . Marital Status: Single    Spouse Name: N/A    Number of Children: N/A  . Years of Education: N/A    Social History Main Topics  . Smoking status: Former Games developer  . Smokeless tobacco: None  . Alcohol Use: No  . Drug Use: No  . Sexually Active: None   Other Topics Concern  . None   Social History Narrative   Lives with husband. Good relationship, feels safe at home.  Looking for work, studying medical administration.       Financial assistance application approved for 100% discount at [facility] and has [facility] card. Feb 15, 2010.    Review of Systems: Review of Systems:  Constitutional:  Denies fever, chills, diaphoresis, appetite change and fatigue.   HEENT:  Denies congestion, sore throat, rhinorrhea, sneezing, mouth sores, trouble swallowing, neck pain   Respiratory:  Denies SOB, DOE, cough, and wheezing.   Cardiovascular:  Denies palpitations and leg swelling.   Gastrointestinal:  Denies nausea, vomiting, abdominal pain, diarrhea, constipation, blood in stool and abdominal distention.   Genitourinary:  Denies dysuria, urgency, frequency, hematuria, flank pain and difficulty urinating.   Musculoskeletal:  Denies myalgias, back pain, joint swelling, arthralgias and gait problem.   Skin:  Denies pallor, rash and wound.   Neurological:  Denies dizziness,  seizures, syncope, weakness, light-headedness, numbness and headaches.    .    Objective:  Physical Exam: Filed Vitals:   11/22/12 1601  BP: 175/95  Pulse: 80  Temp: 98.2 F (36.8 C)  TempSrc: Oral  Height: 5\' 6"  (1.676 m)  Weight: 184 lb 9.6 oz (83.734 kg)  SpO2: 98%   General: alert, well-developed, and cooperative to examination.  Head: normocephalic and atraumatic.  Eyes: vision grossly intact, pupils equal, pupils round, pupils reactive to light, no injection and anicteric.  Mouth: pharynx pink and moist, no erythema, and no exudates.  Neck: supple, full ROM, no thyromegaly, no JVD, and no carotid bruits.  Lungs: normal respiratory effort, no accessory muscle use, normal breath sounds, no crackles, and no  wheezes. Heart: normal rate, regular rhythm, no murmur, no gallop, and no rub.  Abdomen: soft, non-tender, normal bowel sounds, no distention, no guarding, no rebound tenderness, no hepatomegaly, and no splenomegaly.  Msk: no joint swelling, no joint warmth, and no redness over joints.  Pulses: 2+ DP/PT pulses bilaterally Extremities: No cyanosis, clubbing, edema Neurologic: alert & oriented X3, cranial nerves II-XII intact, strength normal in all extremities, sensation intact to light touch, and gait normal.  Skin: turgor normal and no rashes.  Psych: Oriented X3, memory intact for recent and remote, normally interactive, good eye contact, not anxious appearing, and not depressed appearing.    Assessment & Plan:

## 2012-11-22 NOTE — Assessment & Plan Note (Signed)
Ms. Katherine Haynes blood pressure is 175/95 today as she has been out of her medicines for months. She has stage II hypertension which requires treatment with 2 antihypertensives.  However, she reports a history of orthostatic hypotension 2/2 the combination HCTZ/ACEI treatment. I will start HCTZ 25 mg po daily   - will also resume her ASA 81 QD - will check her CMP, HGB A1C and lipid panel today - education on importance of antihypertensive treatment and medical compliance reinforced.

## 2012-11-22 NOTE — Assessment & Plan Note (Signed)
History of seasonal allergy.  - Zyrtec Rx given

## 2012-11-28 NOTE — Progress Notes (Signed)
Case discussed with Dr. Li (at time of visit, soon after the resident saw the patient).  We reviewed the resident's history and exam and pertinent patient test results.  I agree with the assessment, diagnosis, and plan of care documented in the resident's note.  

## 2012-12-24 ENCOUNTER — Ambulatory Visit: Payer: Self-pay | Admitting: Internal Medicine

## 2013-01-14 ENCOUNTER — Ambulatory Visit: Payer: Self-pay | Admitting: Internal Medicine

## 2013-07-22 ENCOUNTER — Ambulatory Visit: Payer: Self-pay

## 2013-08-14 ENCOUNTER — Other Ambulatory Visit: Payer: Self-pay | Admitting: Internal Medicine

## 2013-08-20 ENCOUNTER — Ambulatory Visit: Payer: Self-pay

## 2014-12-05 ENCOUNTER — Emergency Department (HOSPITAL_COMMUNITY)
Admission: EM | Admit: 2014-12-05 | Discharge: 2014-12-05 | Disposition: A | Discharge status: Home / Self Care | Payer: Self-pay | Attending: Emergency Medicine | Admitting: Emergency Medicine

## 2014-12-05 ENCOUNTER — Emergency Department (HOSPITAL_COMMUNITY): Payer: Self-pay

## 2014-12-05 ENCOUNTER — Encounter (HOSPITAL_COMMUNITY): Payer: Self-pay

## 2014-12-05 DIAGNOSIS — Z7982 Long term (current) use of aspirin: Secondary | ICD-10-CM | POA: Insufficient documentation

## 2014-12-05 DIAGNOSIS — Y9289 Other specified places as the place of occurrence of the external cause: Secondary | ICD-10-CM | POA: Insufficient documentation

## 2014-12-05 DIAGNOSIS — Y9389 Activity, other specified: Secondary | ICD-10-CM | POA: Insufficient documentation

## 2014-12-05 DIAGNOSIS — Z7952 Long term (current) use of systemic steroids: Secondary | ICD-10-CM | POA: Insufficient documentation

## 2014-12-05 DIAGNOSIS — Z8639 Personal history of other endocrine, nutritional and metabolic disease: Secondary | ICD-10-CM | POA: Insufficient documentation

## 2014-12-05 DIAGNOSIS — I10 Essential (primary) hypertension: Secondary | ICD-10-CM | POA: Insufficient documentation

## 2014-12-05 DIAGNOSIS — S8391XA Sprain of unspecified site of right knee, initial encounter: Secondary | ICD-10-CM | POA: Insufficient documentation

## 2014-12-05 DIAGNOSIS — J45909 Unspecified asthma, uncomplicated: Secondary | ICD-10-CM | POA: Insufficient documentation

## 2014-12-05 DIAGNOSIS — Z79899 Other long term (current) drug therapy: Secondary | ICD-10-CM | POA: Insufficient documentation

## 2014-12-05 DIAGNOSIS — Z87891 Personal history of nicotine dependence: Secondary | ICD-10-CM | POA: Insufficient documentation

## 2014-12-05 DIAGNOSIS — Z880 Allergy status to penicillin: Secondary | ICD-10-CM | POA: Insufficient documentation

## 2014-12-05 DIAGNOSIS — Z780 Asymptomatic menopausal state: Secondary | ICD-10-CM | POA: Insufficient documentation

## 2014-12-05 DIAGNOSIS — Y998 Other external cause status: Secondary | ICD-10-CM | POA: Insufficient documentation
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
## 2014-12-05 MED ORDER — IBUPROFEN 800 MG PO TABS: 800.0000 mg | ORAL_TABLET | Freq: Three times a day (TID) | ORAL | Status: DC

## 2014-12-05 MED ORDER — HYDROCODONE-ACETAMINOPHEN 5-325 MG PO TABS: 2.0000 | ORAL_TABLET | ORAL | Status: DC | PRN
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
## 2014-12-05 NOTE — Discharge Instructions (Signed)
Knee Sprain A knee sprain is a tear in one of the strong, fibrous tissues that connect the bones (ligaments) in your knee. The severity of the sprain depends on how much of the ligament is torn. The tear can be either partial or complete. CAUSES  Often, sprains are a result of a fall or injury. The force of the impact causes the fibers of your ligament to stretch too much. This excess tension causes the fibers of your ligament to tear. SIGNS AND SYMPTOMS  You may have some loss of motion in your knee. Other symptoms include:  Bruising.  Pain in the knee area.  Tenderness of the knee to the touch.  Swelling. DIAGNOSIS  To diagnose a knee sprain, your health care provider will physically examine your knee. Your health care provider may also suggest an X-ray exam of your knee to make sure no bones are broken. TREATMENT  If your ligament is only partially torn, treatment usually involves keeping the knee in a fixed position (immobilization) or bracing your knee for activities that require movement for several weeks. To do this, your health care provider will apply a bandage, cast, or splint to keep your knee from moving and to support your knee during movement until it heals. For a partially torn ligament, the healing process usually takes 4-6 weeks. If your ligament is completely torn, depending on which ligament it is, you may need surgery to reconnect the ligament to the bone or reconstruct it. After surgery, a cast or splint may be applied and will need to stay on your knee for 4-6 weeks while your ligament heals. HOME CARE INSTRUCTIONS  Keep your injured knee elevated to decrease swelling.  To ease pain and swelling, apply ice to the injured area:  Put ice in a plastic bag.  Place a towel between your skin and the bag.  Leave the ice on for 20 minutes, 2-3 times a day.  Only take medicine for pain as directed by your health care provider.  Do not leave your knee unprotected until  pain and stiffness go away (usually 4-6 weeks).  If you have a cast or splint, do not allow it to get wet. If you have been instructed not to remove it, cover it with a plastic bag when you shower or bathe. Do not swim.  Your health care provider may suggest exercises for you to do during your recovery to prevent or limit permanent weakness and stiffness. SEEK IMMEDIATE MEDICAL CARE IF:  Your cast or splint becomes damaged.  Your pain becomes worse.  You have significant pain, swelling, or numbness below the cast or splint. MAKE SURE YOU:  Understand these instructions.  Will watch your condition.  Will get help right away if you are not doing well or get worse. Document Released: 07/04/2005 Document Revised: 04/24/2013 Document Reviewed: 02/13/2013 [facility] Patient Information 2015 [place], [place]. This information is not intended to replace advice given to you by your health care provider. Make sure you discuss any questions you have with your health care provider. Knee Effusion The medical term for having fluid in your knee is effusion. This is often due to an internal derangement of the knee. This means something is wrong inside the knee. Some of the causes of fluid in the knee may be torn cartilage, a torn ligament, or bleeding into the joint from an injury. Your knee is likely more difficult to bend and move. This is often because there is increased pain and pressure in  the joint. The time it takes for recovery from a knee effusion depends on different factors, including:   Type of injury.  Your age.  Physical and medical conditions.  Rehabilitation Strategies. How long you will be away from your normal activities will depend on what kind of knee problem you have and how much damage is present. Your knee has two types of cartilage. Articular cartilage covers the bone ends and lets your knee bend and move smoothly. Two menisci, thick pads of cartilage that form a rim inside the  joint, help absorb shock and stabilize your knee. Ligaments bind the bones together and support your knee joint. Muscles move the joint, help support your knee, and take stress off the joint itself. CAUSES  Often an effusion in the knee is caused by an injury to one of the menisci. This is often a tear in the cartilage. Recovery after a meniscus injury depends on how much meniscus is damaged and whether you have damaged other knee tissue. Small tears may heal on their own with conservative treatment. Conservative means rest, limited weight bearing activity and muscle strengthening exercises. Your recovery may take up to 6 weeks.  TREATMENT  Larger tears may require surgery. Meniscus injuries may be treated during arthroscopy. Arthroscopy is a procedure in which your surgeon uses a small telescope like instrument to look in your knee. Your caregiver can make a more accurate diagnosis (learning what is wrong) by performing an arthroscopic procedure. If your injury is on the inner margin of the meniscus, your surgeon may trim the meniscus back to a smooth rim. In other cases your surgeon will try to repair a damaged meniscus with stitches (sutures). This may make rehabilitation take longer, but may provide better long term result by helping your knee keep its shock absorption capabilities. Ligaments which are completely torn usually require surgery for repair. HOME CARE INSTRUCTIONS  Use crutches as instructed.  If a brace is applied, use as directed.  Once you are home, an ice pack applied to your swollen knee may help with discomfort and help decrease swelling.  Keep your knee raised (elevated) when you are not up and around or on crutches.  Only take over-the-counter or prescription medicines for pain, discomfort, or fever as directed by your caregiver.  Your caregivers will help with instructions for rehabilitation of your knee. This often includes strengthening exercises.  You may resume a  normal diet and activities as directed. SEEK MEDICAL CARE IF:   There is increased swelling in your knee.  You notice redness, swelling, or increasing pain in your knee.  An unexplained oral temperature above 102 F (38.9 C) develops. SEEK IMMEDIATE MEDICAL CARE IF:   You develop a rash.  You have difficulty breathing.  You have any allergic reactions from medications you may have been given.  There is severe pain with any motion of the knee. MAKE SURE YOU:   Understand these instructions.  Will watch your condition.  Will get help right away if you are not doing well or get worse. Document Released: 09/24/2003 Document Revised: 09/26/2011 Document Reviewed: 11/28/2007 [facility] Patient Information 2015 [place], [place]. This information is not intended to replace advice given to you by your health care provider. Make sure you discuss any questions you have with your health care provider.
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
## 2014-12-05 NOTE — ED Notes (Signed)
Signature pad broken

## 2014-12-16 ENCOUNTER — Other Ambulatory Visit (HOSPITAL_COMMUNITY): Payer: Self-pay | Admitting: Orthopaedic Surgery

## 2014-12-16 DIAGNOSIS — M25561 Pain in right knee: Secondary | ICD-10-CM

## 2014-12-24 ENCOUNTER — Ambulatory Visit (HOSPITAL_COMMUNITY)
Admission: RE | Admit: 2014-12-24 | Discharge: 2014-12-24 | Disposition: A | Discharge status: Home / Self Care | Payer: Self-pay | Source: Ambulatory Visit | Attending: Orthopaedic Surgery | Admitting: Orthopaedic Surgery

## 2014-12-24 ENCOUNTER — Other Ambulatory Visit (HOSPITAL_COMMUNITY): Payer: Self-pay | Admitting: Orthopaedic Surgery

## 2014-12-24 ENCOUNTER — Encounter (HOSPITAL_COMMUNITY): Payer: Self-pay

## 2014-12-24 DIAGNOSIS — M25561 Pain in right knee: Secondary | ICD-10-CM

## 2014-12-24 DIAGNOSIS — S82141A Displaced bicondylar fracture of right tibia, initial encounter for closed fracture: Secondary | ICD-10-CM | POA: Insufficient documentation

## 2014-12-24 DIAGNOSIS — W19XXXA Unspecified fall, initial encounter: Secondary | ICD-10-CM | POA: Insufficient documentation

## 2014-12-24 IMAGING — CT CT KNEE*R* W/O CM
3 series · 16 of 33 positions shown, 19 images · non-contrast
Comparison: Right knee radiographs [DATE]

CLINICAL DATA: Fell 3 weeks ago.  Persistent pain.

EXAM:
CT OF THE right KNEE WITHOUT CONTRAST
TECHNIQUE: Multidetector CT imaging of the right knee was performed according
to the standard protocol. Multiplanar CT image reconstructions were
also generated.

[Series 4: rt.knee st · axial · 0.32mm/px · z∈[-175,-23]mm · 8 of 90 slices shown, 10 images]
[im 7/90  soft-tissue]
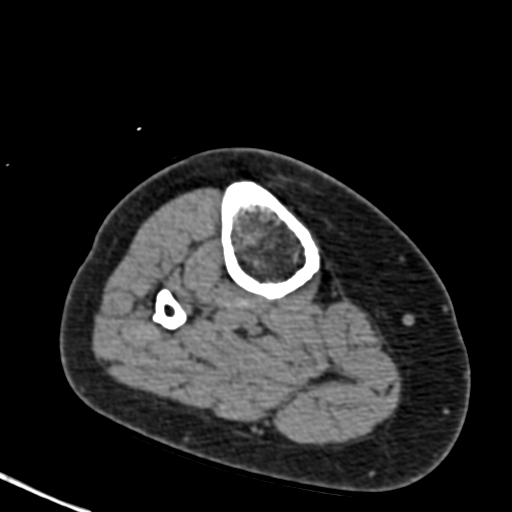
[im 7/90  bone]
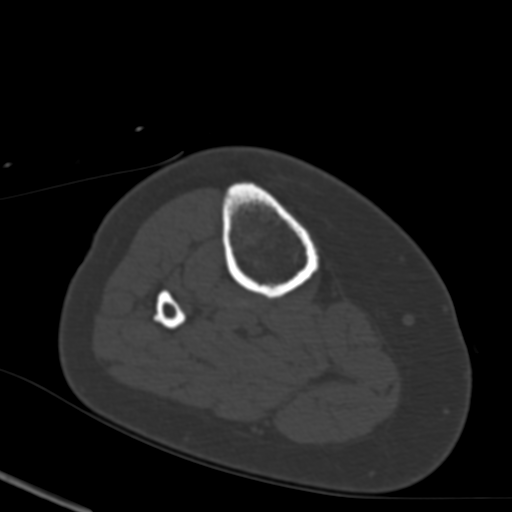
[im 21/90  bone]
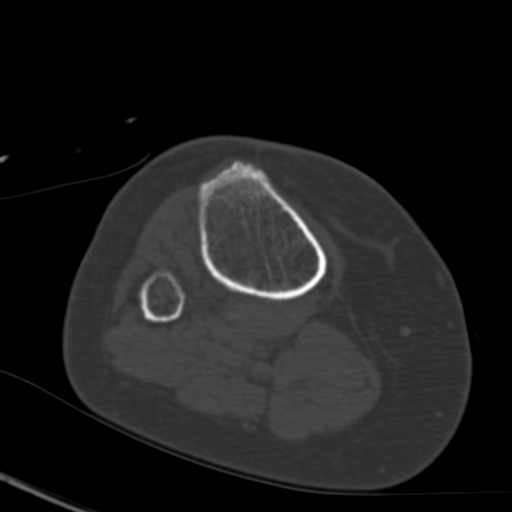
[im 28/90  bone]
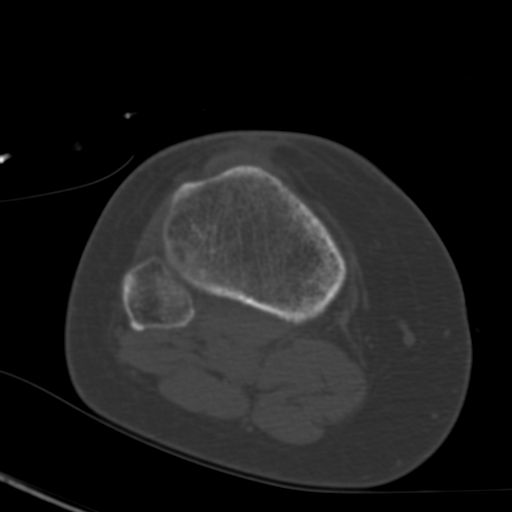
[im 42/90  bone]
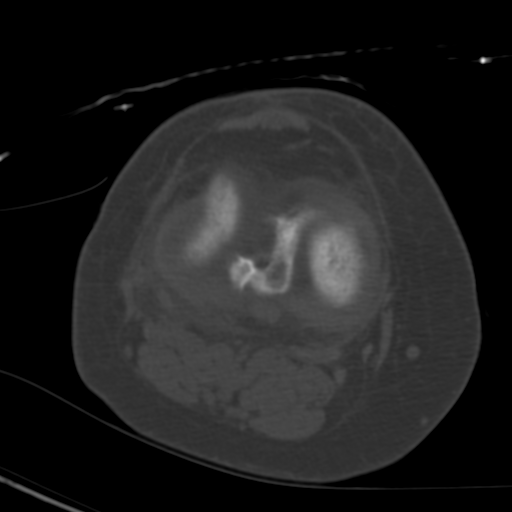
[im 48/90  soft-tissue]
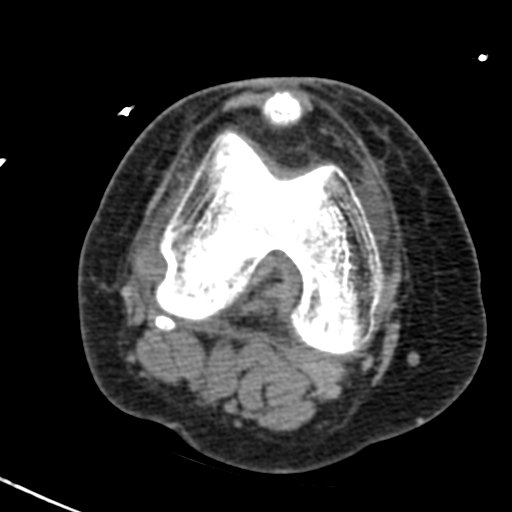
[im 48/90  bone]
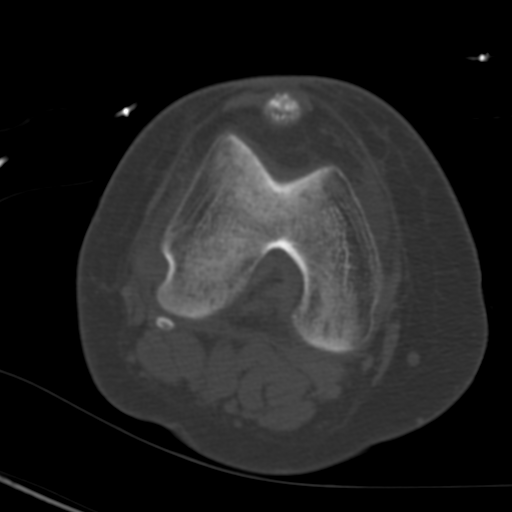
[im 62/90  bone]
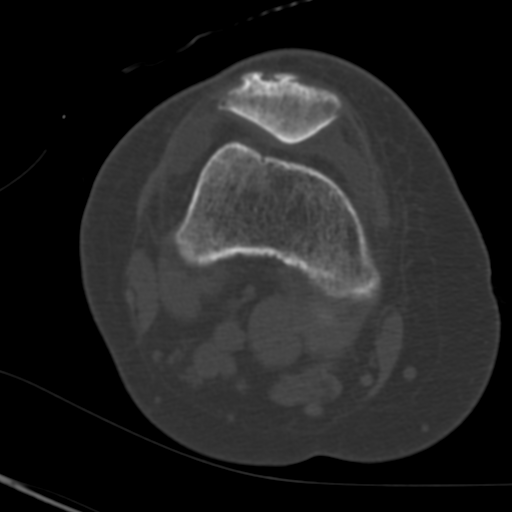
[im 69/90  bone]
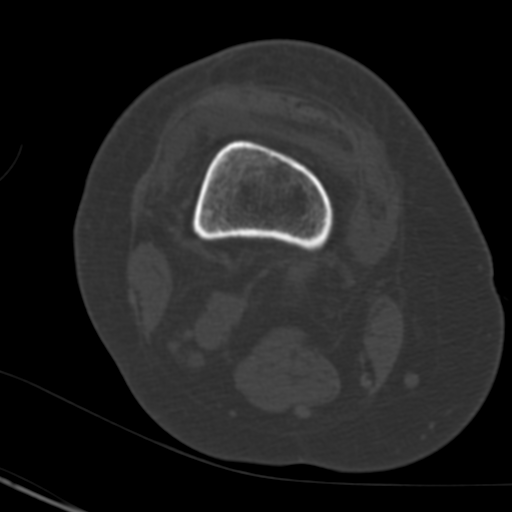
[im 83/90  bone]
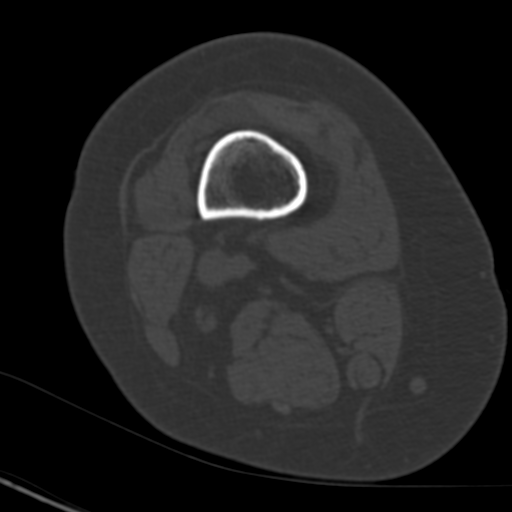

[Series 602: <mpr thick range> · coronal · 0.35mm/px · 3 of 68 slices shown]
[im 14/68  bone]
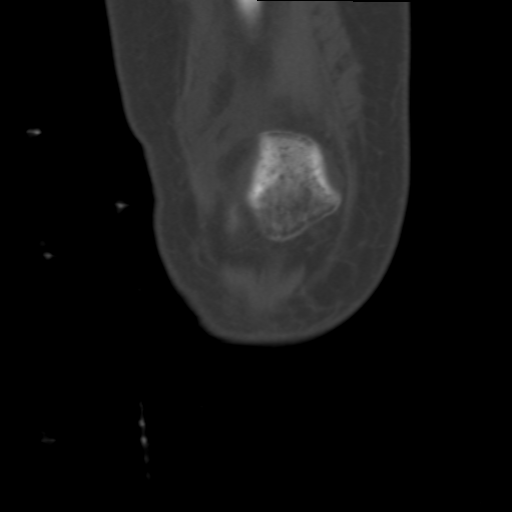
[im 27/68  bone]
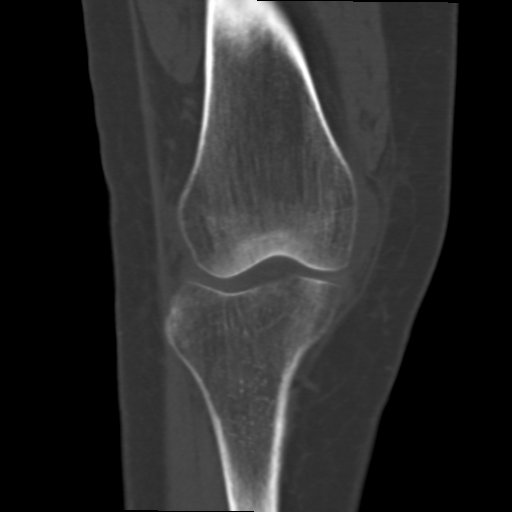
[im 41/68  bone]
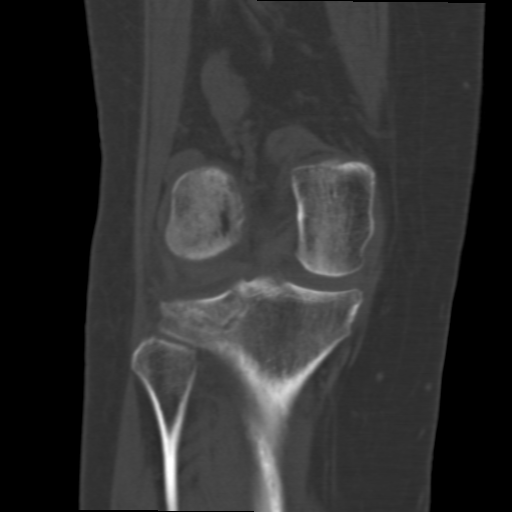

[Series 603: <mpr thick range(1)> · sagittal · 0.35mm/px · 5 of 67 slices shown, 6 images]
[im 23/67  bone]
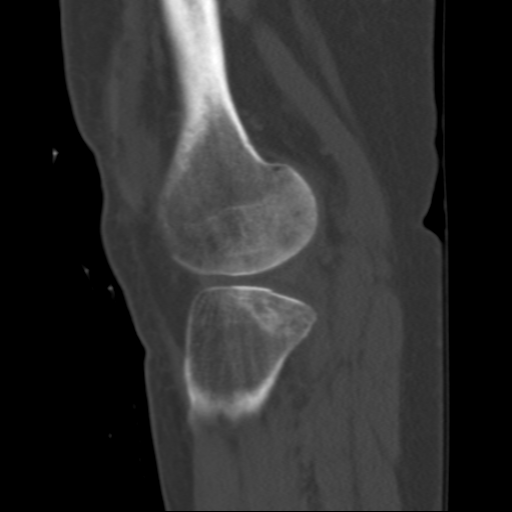
[im 28/67  bone]
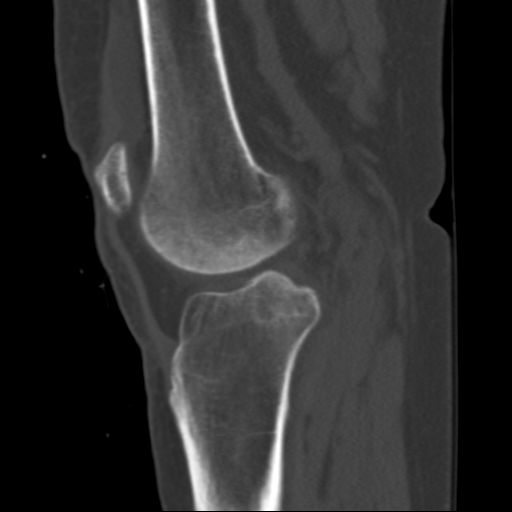
[im 34/67  soft-tissue]
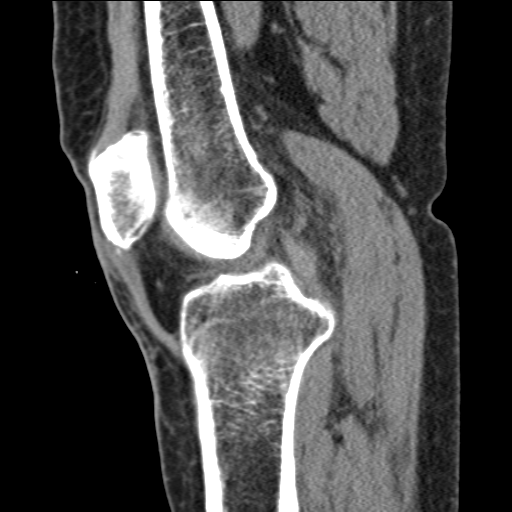
[im 34/67  bone]
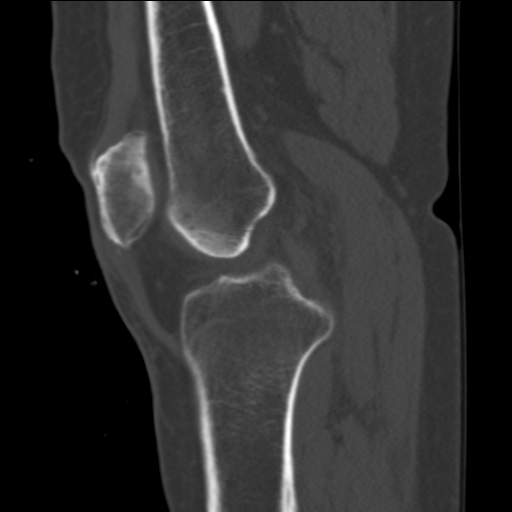
[im 39/67  bone]
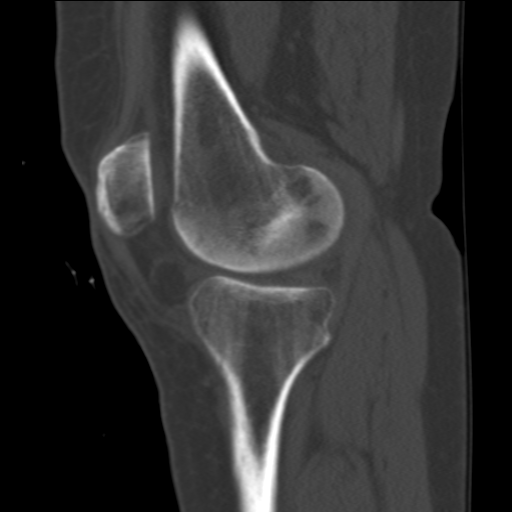
[im 45/67  bone]
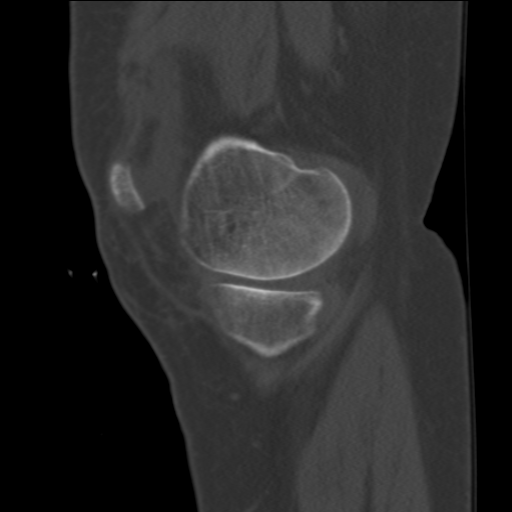

[16 of 33 positions shown; findings below may reference images not displayed]

FINDINGS: There is a die punch type fracture involving the lateral tibial
plateau posteriorly. Minimal depression estimated at 3 mm. The
medial tibial plateau is normal. No fractures of the femur, fibula
or patella are identified. There is a moderate-sized knee joint
effusion.

The cruciate and collateral ligaments appear to be grossly intact.
Probable focus of chronic calcification involving the lateral
collateral ligament related to prior trauma.
IMPRESSION: Shallow die punch type fracture involving the posterior aspect of
the lateral tibial plateau (please see 3D images). Moderate
associated joint effusion.

## 2016-03-23 IMAGING — US US ABDOMEN LIMITED
1 series · 14 of 25 positions shown · non-contrast
Comparison: None.

CLINICAL DATA: 66-year-old female with vomiting and diarrhea with
mid abdominal pain since last night. Initial encounter.

EXAM:
ULTRASOUND ABDOMEN LIMITED RIGHT UPPER QUADRANT

[Series 1: us abdomen limited · 0.25mm/px · 14 of 58 slices shown]
[im 1/58]
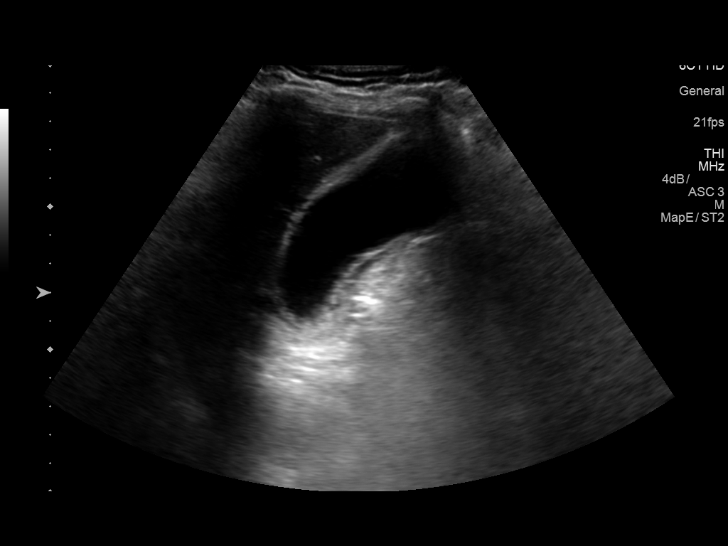
[im 5/58]
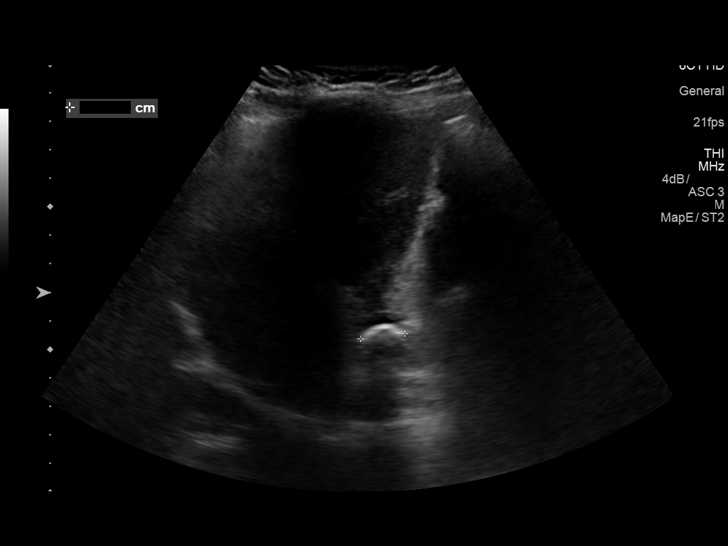
[im 10/58]
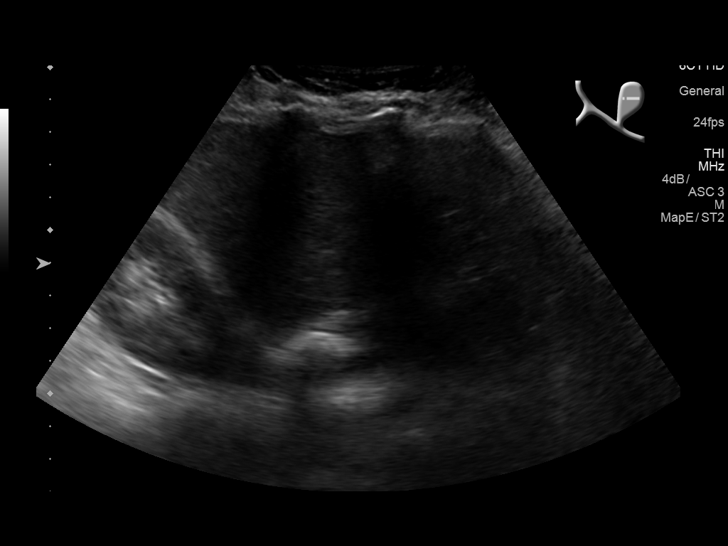
[im 15/58]
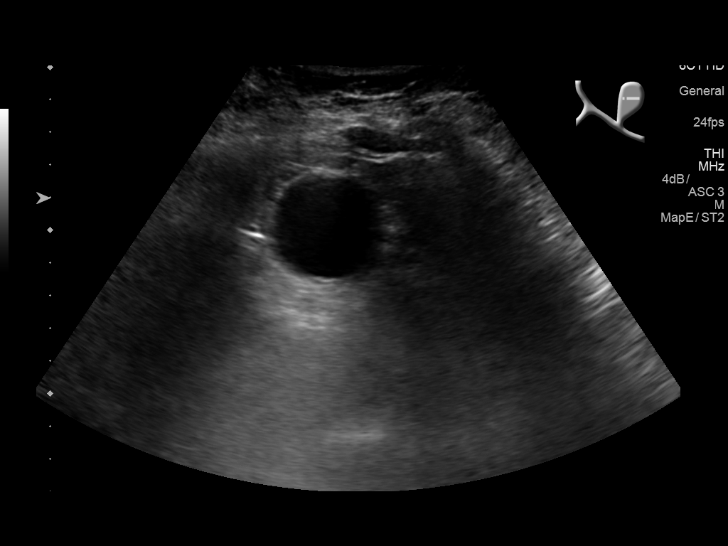
[im 20/58]
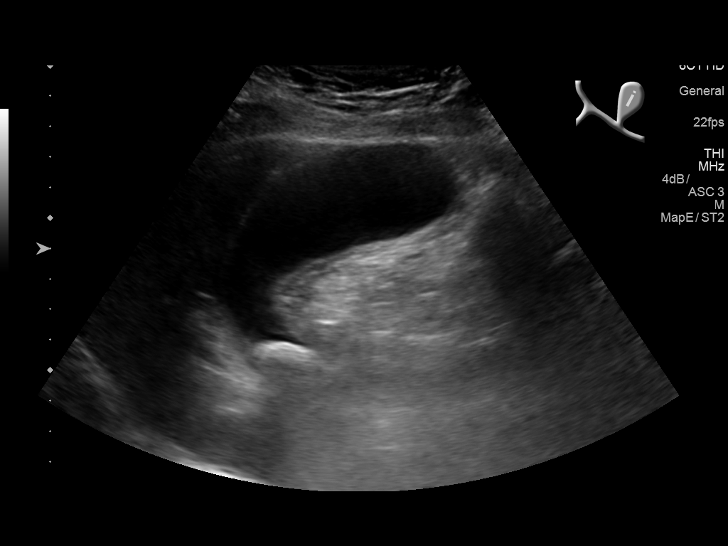
[im 22/58]
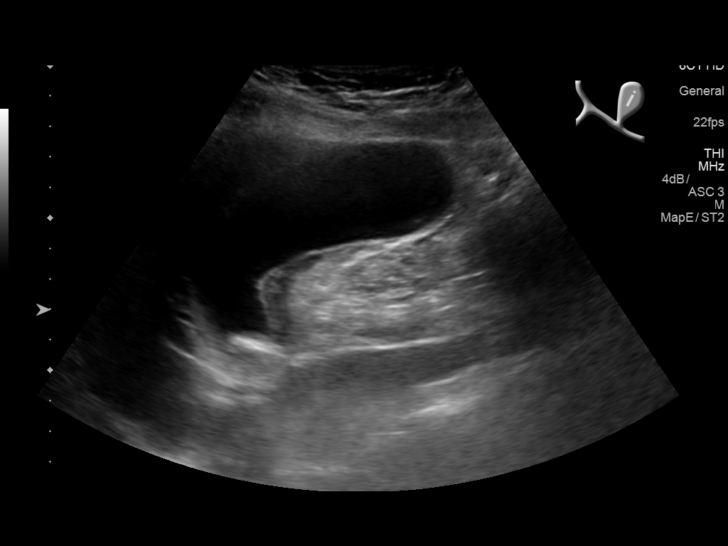
[im 27/58]
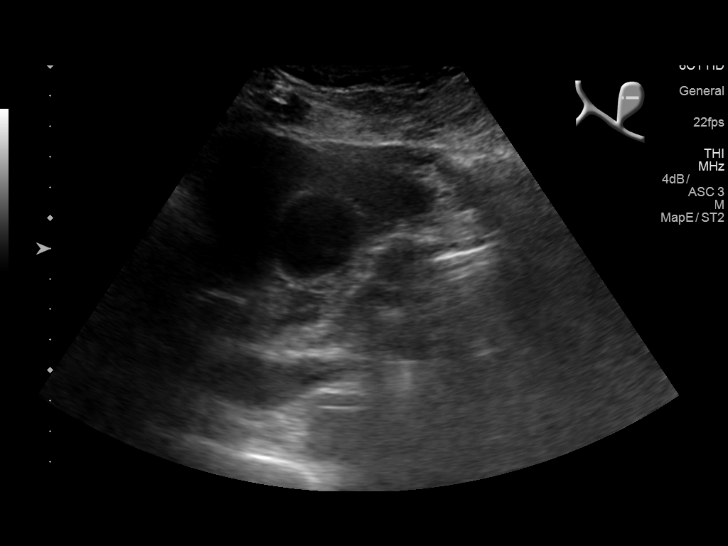
[im 31/58]
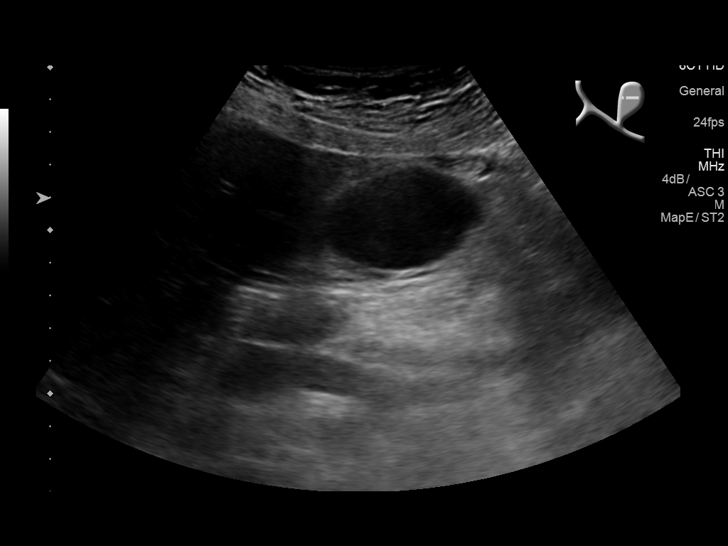
[im 36/58]
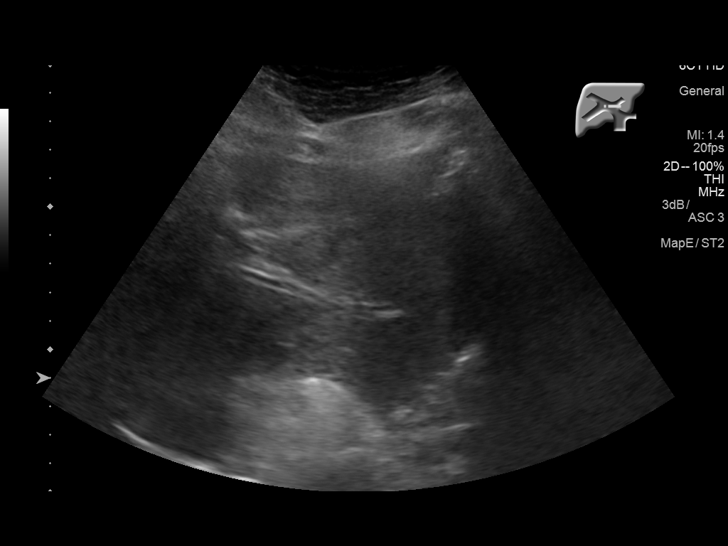
[im 39/58]
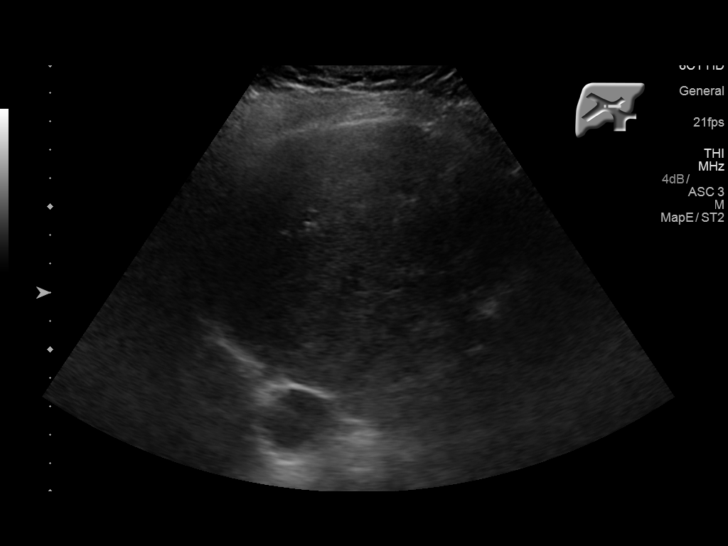
[im 43/58]
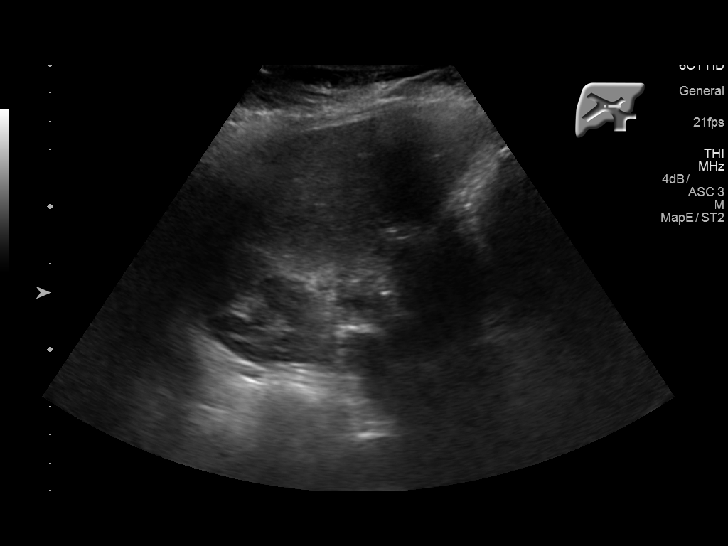
[im 48/58]
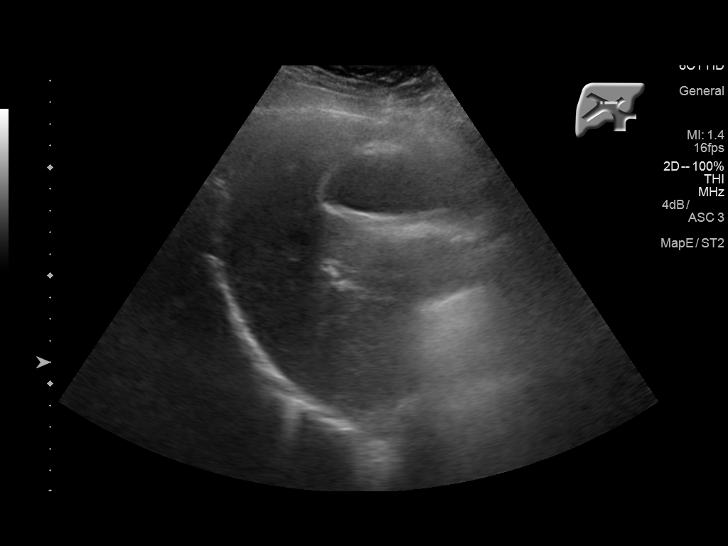
[im 53/58]
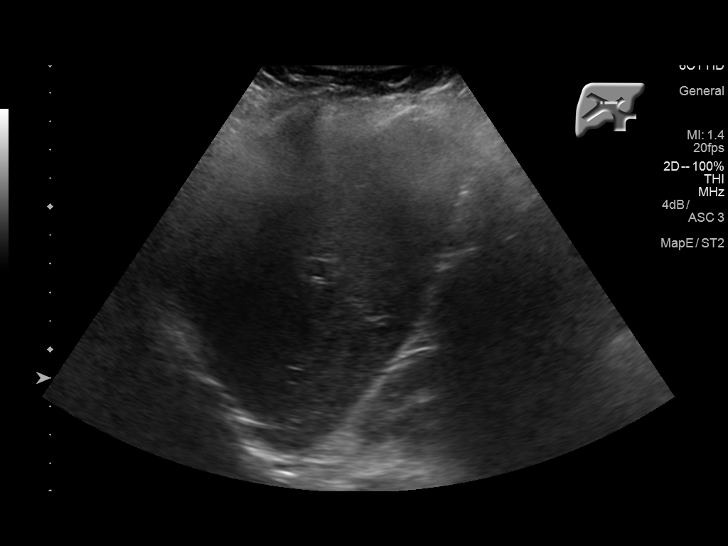
[im 58/58]
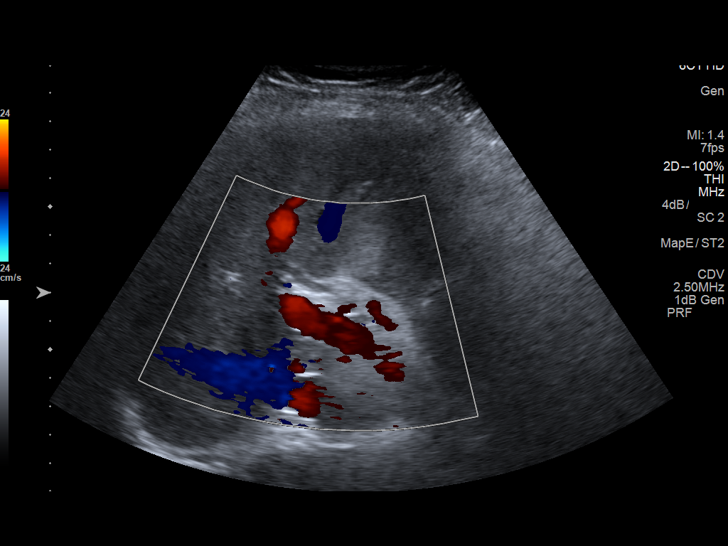

[14 of 25 positions shown; findings below may reference images not displayed]

FINDINGS: Gallbladder:

1.6 cm gallstone lodged in the gallbladder neck. Gallbladder wall
thickening measuring up to 7.2 mm. Question tiny amount of
pericholecystic fluid. Patient was slightly tender over this region
during scanning.

Common bile duct:

Diameter: 4.7 mm

Liver:

Exam is slightly limited. No focal lesion identified. Within normal
limits in parenchymal echogenicity. Portal vein is patent on color
Doppler imaging with normal direction of blood flow towards the
liver.
IMPRESSION: Findings suspicious for acute cholecystitis. 1.6 cm gallstone lodged
in the gallbladder neck. Gallbladder wall thickening. Question tiny
noted peri cholecystic fluid.

Slightly limited evaluation of the liver without obvious
abnormality.

These results were called by telephone at the time of interpretation
on [DATE] at [DATE] to Dr. WILMA, who verbally acknowledged these
results.

## 2016-12-29 ENCOUNTER — Ambulatory Visit: Payer: Self-pay

## 2017-04-04 ENCOUNTER — Encounter (HOSPITAL_COMMUNITY): Payer: Self-pay

## 2017-04-04 ENCOUNTER — Emergency Department (HOSPITAL_COMMUNITY): Payer: Self-pay

## 2017-04-04 ENCOUNTER — Observation Stay (HOSPITAL_COMMUNITY)
Admission: EM | Admit: 2017-04-04 | Discharge: 2017-04-06 | Disposition: A | Discharge status: Home / Self Care | Payer: Self-pay | Attending: General Surgery | Admitting: General Surgery

## 2017-04-04 DIAGNOSIS — K810 Acute cholecystitis: Secondary | ICD-10-CM

## 2017-04-04 DIAGNOSIS — E785 Hyperlipidemia, unspecified: Secondary | ICD-10-CM | POA: Insufficient documentation

## 2017-04-04 DIAGNOSIS — K8000 Calculus of gallbladder with acute cholecystitis without obstruction: Secondary | ICD-10-CM | POA: Diagnosis present

## 2017-04-04 DIAGNOSIS — Z880 Allergy status to penicillin: Secondary | ICD-10-CM | POA: Insufficient documentation

## 2017-04-04 DIAGNOSIS — I10 Essential (primary) hypertension: Secondary | ICD-10-CM | POA: Insufficient documentation

## 2017-04-04 DIAGNOSIS — Z79899 Other long term (current) drug therapy: Secondary | ICD-10-CM | POA: Insufficient documentation

## 2017-04-04 DIAGNOSIS — R197 Diarrhea, unspecified: Secondary | ICD-10-CM

## 2017-04-04 DIAGNOSIS — Z882 Allergy status to sulfonamides status: Secondary | ICD-10-CM | POA: Insufficient documentation

## 2017-04-04 DIAGNOSIS — K8010 Calculus of gallbladder with chronic cholecystitis without obstruction: Principal | ICD-10-CM | POA: Insufficient documentation

## 2017-04-04 DIAGNOSIS — R1110 Vomiting, unspecified: Secondary | ICD-10-CM

## 2017-04-04 DIAGNOSIS — J45909 Unspecified asthma, uncomplicated: Secondary | ICD-10-CM | POA: Insufficient documentation

## 2017-04-04 DIAGNOSIS — Z7982 Long term (current) use of aspirin: Secondary | ICD-10-CM | POA: Insufficient documentation

## 2017-04-04 DIAGNOSIS — Z87891 Personal history of nicotine dependence: Secondary | ICD-10-CM | POA: Insufficient documentation

## 2017-04-04 LAB — COMPREHENSIVE METABOLIC PANEL
ALK PHOS: 79 U/L (ref 38–126)
ALT: 30 U/L (ref 14–54)
AST: 36 U/L (ref 15–41)
Albumin: 4.2 g/dL (ref 3.5–5.0)
Anion gap: 8 (ref 5–15)
BUN: 7 mg/dL (ref 6–20)
CHLORIDE: 106 mmol/L (ref 101–111)
CO2: 27 mmol/L (ref 22–32)
CREATININE: 0.72 mg/dL (ref 0.44–1.00)
Calcium: 9.2 mg/dL (ref 8.9–10.3)
GFR calc Af Amer: >60 mL/min (ref >60)
GFR calc non Af Amer: >60 mL/min (ref >60)
Glucose, Bld: 113 mg/dL — ABNORMAL HIGH (ref 65–99)
Potassium: 3.7 mmol/L (ref 3.5–5.1)
SODIUM: 141 mmol/L (ref 135–145)
Total Bilirubin: 0.5 mg/dL (ref 0.3–1.2)
Total Protein: 7.7 g/dL (ref 6.5–8.1)

## 2017-04-04 LAB — URINALYSIS, ROUTINE W REFLEX MICROSCOPIC
Bilirubin Urine: NEGATIVE
Glucose, UA: NEGATIVE mg/dL
Ketones, ur: NEGATIVE mg/dL
Leukocytes, UA: NEGATIVE
Nitrite: NEGATIVE
Protein, ur: NEGATIVE mg/dL
Specific Gravity, Urine: 1.002 — ABNORMAL LOW (ref 1.005–1.030)
pH: 7 (ref 5.0–8.0)

## 2017-04-04 LAB — CBC
HCT: 44.8 % (ref 36.0–46.0)
HEMOGLOBIN: 15.4 g/dL — AB (ref 12.0–15.0)
MCH: 31.3 pg (ref 26.0–34.0)
MCHC: 34.4 g/dL (ref 30.0–36.0)
MCV: 91.1 fL (ref 78.0–100.0)
PLATELETS: 323 10*3/uL (ref 150–400)
RBC: 4.92 MIL/uL (ref 3.87–5.11)
RDW: 13.5 % (ref 11.5–15.5)
WBC: 10.3 10*3/uL (ref 4.0–10.5)

## 2017-04-04 LAB — I-STAT TROPONIN, ED: Troponin i, poc: 0.02 ng/mL (ref 0.00–0.08)

## 2017-04-04 LAB — LIPASE, BLOOD: Lipase: 42 U/L (ref 11–51)

## 2017-04-04 MED ORDER — HYDROCODONE-ACETAMINOPHEN 5-325 MG PO TABS
1.0000 | ORAL_TABLET | ORAL | Status: DC | PRN
  Administered 2017-04-05: 2 via ORAL
  Filled 2017-04-04: qty 2

## 2017-04-04 MED ORDER — CIPROFLOXACIN IN D5W 400 MG/200ML IV SOLN
400.0000 mg | Freq: Two times a day (BID) | INTRAVENOUS | Status: DC
  Administered 2017-04-04 – 2017-04-05 (×3): 400 mg via INTRAVENOUS
  Filled 2017-04-04 (×3): qty 200

## 2017-04-04 MED ORDER — ACETAMINOPHEN 325 MG PO TABS: 650.0000 mg | ORAL_TABLET | Freq: Four times a day (QID) | ORAL | Status: DC | PRN

## 2017-04-04 MED ORDER — SODIUM CHLORIDE 0.9 % IV BOLUS (SEPSIS)
1000.0000 mL | Freq: Once | INTRAVENOUS | Status: AC
  Administered 2017-04-04: 1000 mL via INTRAVENOUS

## 2017-04-04 MED ORDER — ACETAMINOPHEN 650 MG RE SUPP: 650.0000 mg | Freq: Four times a day (QID) | RECTAL | Status: DC | PRN

## 2017-04-04 MED ORDER — HYDROCHLOROTHIAZIDE 12.5 MG PO CAPS
25.0000 mg | ORAL_CAPSULE | Freq: Once | ORAL | Status: AC
  Administered 2017-04-04: 25 mg via ORAL
  Filled 2017-04-04: qty 2

## 2017-04-04 MED ORDER — INFLUENZA VAC SPLIT QUAD 0.5 ML IM SUSY: 0.5000 mL | PREFILLED_SYRINGE | INTRAMUSCULAR | Status: DC

## 2017-04-04 MED ORDER — ONDANSETRON 4 MG PO TBDP
4.0000 mg | ORAL_TABLET | Freq: Four times a day (QID) | ORAL | Status: DC | PRN
  Administered 2017-04-06: 4 mg via ORAL
  Filled 2017-04-04: qty 1

## 2017-04-04 MED ORDER — HYDROMORPHONE HCL 1 MG/ML IJ SOLN
1.0000 mg | INTRAMUSCULAR | Status: DC | PRN
  Administered 2017-04-04: 1 mg via INTRAVENOUS
  Filled 2017-04-04: qty 1

## 2017-04-04 MED ORDER — SUCRALFATE 1 G PO TABS
1.0000 g | ORAL_TABLET | Freq: Once | ORAL | Status: AC
  Administered 2017-04-04: 1 g via ORAL
  Filled 2017-04-04: qty 1

## 2017-04-04 MED ORDER — KCL IN DEXTROSE-NACL 20-5-0.45 MEQ/L-%-% IV SOLN
INTRAVENOUS | Status: DC
  Administered 2017-04-04: 21:00:00 via INTRAVENOUS
  Filled 2017-04-04 (×3): qty 1000

## 2017-04-04 MED ORDER — MORPHINE SULFATE (PF) 4 MG/ML IV SOLN
4.0000 mg | Freq: Once | INTRAVENOUS | Status: AC
  Administered 2017-04-04: 4 mg via INTRAVENOUS
  Filled 2017-04-04: qty 1

## 2017-04-04 MED ORDER — ONDANSETRON HCL 4 MG/2ML IJ SOLN
4.0000 mg | Freq: Four times a day (QID) | INTRAMUSCULAR | Status: DC | PRN
  Administered 2017-04-04 – 2017-04-05 (×4): 4 mg via INTRAVENOUS
  Filled 2017-04-04 (×3): qty 2

## 2017-04-04 MED ORDER — LABETALOL HCL 5 MG/ML IV SOLN
5.0000 mg | INTRAVENOUS | Status: DC | PRN
  Filled 2017-04-04: qty 4

## 2017-04-04 MED ORDER — PNEUMOCOCCAL VAC POLYVALENT 25 MCG/0.5ML IJ INJ
0.5000 mL | INJECTION | INTRAMUSCULAR | Status: DC
  Filled 2017-04-04: qty 0.5

## 2017-04-04 MED ORDER — ONDANSETRON 4 MG PO TBDP
4.0000 mg | ORAL_TABLET | Freq: Once | ORAL | Status: AC
  Administered 2017-04-04: 4 mg via ORAL
  Filled 2017-04-04: qty 1

## 2017-04-04 MED ORDER — HYDRALAZINE HCL 20 MG/ML IJ SOLN
10.0000 mg | Freq: Once | INTRAMUSCULAR | Status: AC
  Administered 2017-04-04: 10 mg via INTRAVENOUS
  Filled 2017-04-04: qty 1

## 2017-04-04 NOTE — H&P (Addendum)
Katherine Haynes is an 65 y.o. female.    General Surgery [facility]  Chief Complaint: abdominal pain, acute cholecystitis, cholelithiasis  Referring MD: Dr. Shirlyn Goltz, [facility]  HPI: patient is a 66 yo BF with 24 hour hx of abdominal pain, nausea, emesis, and diarrhea.  Patient presented to ER for evaluation.  Labs essentially normal with WBC 10K.  USN shows markedly thickened gallbladder wall with 1.6 cm stone impacted in gallbladder neck.  Pain unrelenting until IV narcotics given.  No fever.  No hx of pancreatitis or hepatitis.  Previous episodes of epigastric and RUQ pain.  No family hx of gallbladder disease.  Patient has had tubal ligation.  No other abdominal surgery.  Hx of HTN - non-compliant with Rx  Past Medical History:  Diagnosis Date  . Asthma   . Gastrocnemius muscle rupture    H/O partial right calf rupture.  Marland Kitchen History of migraine headaches   . Hyperlipidemia   . Hypertension   . Low back pain   . Menopause syndrome   . Orthostatic hypotension 02/2010   H/O. Pt seen by [facility], who thought likely 2/2 bp meds - dc'ed her Lisinopril/HCTZ    Past Surgical History:  Procedure Laterality Date  . MOUTH SURGERY    . TONSILLECTOMY    . TUBAL LIGATION      Family History  Problem Relation Age of Onset  . Heart disease Mother        "enlarged heart"  . Heart disease Maternal Uncle        Bypass surg  . Heart attack Maternal Grandmother        also had irregular heart beat requriring a pacemaker.  . Hypertension Other        In multiple relatives.    Social History:  reports that she has quit smoking. She has never used smokeless tobacco. She reports that she does not drink alcohol or use drugs.  Allergies:  Allergies  Allergen Reactions  . Penicillins Hives  . Sulfonamide Derivatives Itching     (Not in a hospital admission)  Results for orders placed or performed during the hospital encounter of 04/04/17 (from the  past 48 hour(s))  CBC     Status: Abnormal   Collection Time: 04/04/17  2:35 PM  Result Value Ref Range   WBC 10.3 4.0 - 10.5 K/uL   RBC 4.92 3.87 - 5.11 MIL/uL   Hemoglobin 15.4 (H) 12.0 - 15.0 g/dL   HCT 44.8 36.0 - 46.0 %   MCV 91.1 78.0 - 100.0 fL   MCH 31.3 26.0 - 34.0 pg   MCHC 34.4 30.0 - 36.0 g/dL   RDW 13.5 11.5 - 15.5 %   Platelets 323 150 - 400 K/uL  Urinalysis, Routine w reflex microscopic     Status: Abnormal   Collection Time: 04/04/17  2:40 PM  Result Value Ref Range   Color, Urine STRAW (A) YELLOW   APPearance HAZY (A) CLEAR   Specific Gravity, Urine 1.002 (L) 1.005 - 1.030   pH 7.0 5.0 - 8.0   Glucose, UA NEGATIVE NEGATIVE mg/dL   Hgb urine dipstick SMALL (A) NEGATIVE   Bilirubin Urine NEGATIVE NEGATIVE   Ketones, ur NEGATIVE NEGATIVE mg/dL   Protein, ur NEGATIVE NEGATIVE mg/dL   Nitrite NEGATIVE NEGATIVE   Leukocytes, UA NEGATIVE NEGATIVE   RBC / HPF 0-5 0 - 5 RBC/hpf   WBC, UA 0-5 0 - 5 WBC/hpf   Bacteria, UA RARE (  A) NONE SEEN   Squamous Epithelial / LPF 6-30 (A) NONE SEEN  Lipase, blood     Status: None   Collection Time: 04/04/17  5:08 PM  Result Value Ref Range   Lipase 42 11 - 51 U/L  Comprehensive metabolic panel     Status: Abnormal   Collection Time: 04/04/17  5:14 PM  Result Value Ref Range   Sodium 141 135 - 145 mmol/L   Potassium 3.7 3.5 - 5.1 mmol/L   Chloride 106 101 - 111 mmol/L   CO2 27 22 - 32 mmol/L   Glucose, Bld 113 (H) 65 - 99 mg/dL   BUN 7 6 - 20 mg/dL   Creatinine, Ser 0.72 0.44 - 1.00 mg/dL   Calcium 9.2 8.9 - 10.3 mg/dL   Total Protein 7.7 6.5 - 8.1 g/dL   Albumin 4.2 3.5 - 5.0 g/dL   AST 36 15 - 41 U/L   ALT 30 14 - 54 U/L   Alkaline Phosphatase 79 38 - 126 U/L   Total Bilirubin 0.5 0.3 - 1.2 mg/dL   GFR calc non Af Amer >60 >60 mL/min   GFR calc Af Amer >60 >60 mL/min    Comment: (NOTE) The eGFR has been calculated using the CKD EPI equation. This calculation has not been validated in all clinical situations. eGFR's  persistently <60 mL/min signify possible Chronic Kidney Disease.    Anion gap 8 5 - 15  I-stat troponin, ED     Status: None   Collection Time: 04/04/17  5:19 PM  Result Value Ref Range   Troponin i, poc 0.02 0.00 - 0.08 ng/mL   Comment 3            Comment: Due to the release kinetics of cTnI, a negative result within the first hours of the onset of symptoms does not rule out myocardial infarction with certainty. If myocardial infarction is still suspected, repeat the test at appropriate intervals.    US Abdomen Limited Ruq  Result Date: 04/04/2017 CLINICAL DATA:  66 year old female with vomiting and diarrhea with mid abdominal pain since last night. Initial encounter. EXAM: ULTRASOUND ABDOMEN LIMITED RIGHT UPPER QUADRANT COMPARISON:  None. FINDINGS: Gallbladder: 1.6 cm gallstone lodged in the gallbladder neck. Gallbladder wall thickening measuring up to 7.2 mm. Question tiny amount of pericholecystic fluid. Patient was slightly tender over this region during scanning. Common bile duct: Diameter: 4.7 mm Liver: Exam is slightly limited. No focal lesion identified. Within normal limits in parenchymal echogenicity. Portal vein is patent on color Doppler imaging with normal direction of blood flow towards the liver. IMPRESSION: Findings suspicious for acute cholecystitis. 1.6 cm gallstone lodged in the gallbladder neck. Gallbladder wall thickening. Question tiny noted peri cholecystic fluid. Slightly limited evaluation of the liver without obvious abnormality. These results were called by telephone at the time of interpretation on 04/04/2017 at 5:09 pm to Dr. Darl Householder, who verbally acknowledged these results. Electronically Signed   By: Genia Del M.D.   On: 04/04/2017 17:13    Review of Systems  Constitutional: Negative for chills, diaphoresis and fever.  HENT: Negative.   Eyes: Negative.   Respiratory: Negative.   Cardiovascular: Positive for leg swelling.  Gastrointestinal: Positive for  abdominal pain, diarrhea, heartburn, nausea and vomiting. Negative for blood in stool and melena.  Genitourinary: Negative.   Musculoskeletal: Negative.   Skin: Negative.   Neurological: Negative.  Negative for weakness.  Endo/Heme/Allergies: Negative.   Psychiatric/Behavioral: Negative.     Blood pressure (!) 186/79,  pulse 66, temperature (!) 97.5 F (36.4 C), temperature source Oral, resp. rate 12, last menstrual period 10/07/1990, SpO2 100 %. Physical Exam  Constitutional: She is oriented to person, place, and time. She appears well-developed and well-nourished. No distress.  HENT:  Head: Normocephalic and atraumatic.  Right Ear: External ear normal.  Left Ear: External ear normal.  Mouth/Throat: No oropharyngeal exudate.  Eyes: Pupils are equal, round, and reactive to light. Conjunctivae are normal. No scleral icterus.  Neck: Normal range of motion. Neck supple. No tracheal deviation present. No thyromegaly present.  Cardiovascular: Normal rate, regular rhythm, normal heart sounds and intact distal pulses.   No murmur heard. Respiratory: Effort normal and breath sounds normal. No respiratory distress. She has no wheezes.  GI: Soft. Bowel sounds are normal. She exhibits no distension and no mass. There is tenderness (mild to moderate RUQ). There is no rebound and no guarding.  Musculoskeletal: Normal range of motion. She exhibits no edema, tenderness or deformity.  Neurological: She is alert and oriented to person, place, and time.  Skin: Skin is warm and dry. She is not diaphoretic.  Psychiatric: She has a normal mood and affect. Her behavior is normal.     Assessment/Plan Acute cholecystitis, cholelithiasis  Admit to general surgery service  Initiate IV abx  Rx pain, nausea  IV hydration  Clear liquid diet, NPO after MN for OR tomorrow  Anticipate lap chole with IOC tomorrow per Dr. Gurney Maxin  Orders entered in Epic  The risks and benefits of the procedure have been  discussed at length with the patient.  The patient understands the proposed procedure, potential alternative treatments, and the course of recovery to be expected.  All of the patient's questions have been answered at this time.  The patient wishes to proceed with surgery.  Earnstine Regal, MD, [facility] Office: [place], MD 04/04/2017, 7:47 PM

## 2017-04-04 NOTE — ED Provider Notes (Signed)
I saw and evaluated the patient, reviewed the resident's note and I agree with the findings and plan.   EKG Interpretation None     66 year old female presents with a less than 24-hour history of epigastric abdominal pain associated with emesis as well as watery stools. Possible bad food exposure. Denies any urinary symptoms. On physical exam, she has mild epigastric tenderness. Will IV hydrate here as well as check labs as well as ultrasound of her abdomen to rule out gallbladder disease.   Lacretia Leigh, MD 04/04/17 [phone]

## 2017-04-04 NOTE — ED Triage Notes (Addendum)
Per EMS, Pt, from home, c/o mid abdominal pain and n/v/d x last night.  Pain score 9/10.  Pt reports previous Hx of HTN.  Sts she was taken off her medication after some weight loss.  Pt given 4mg  ODT Zofran en route.

## 2017-04-04 NOTE — ED Notes (Signed)
Lab called and stated light green tube clotted.  This RN attempted straight stick and IV pull without success. Made phlebotomy aware.

## 2017-04-04 NOTE — ED Provider Notes (Signed)
  Physical Exam  BP (!) 186/79   Pulse 66   Temp (!) 97.5 F (36.4 C) (Oral)   Resp 12   LMP 10/07/1990   SpO2 100%   Physical Exam  ED Course  Procedures  MDM Patient care assumed at sign out around 5pm. Patient had acute onset of nausea, vomiting, epigastric pain today. Sign out pending CMP and RUQ [place].   6:57 PM RUQ US showed acute chole. LFTs nl. Was hypertensive on arrival 200/100, down to 186/79 with hydralazine, HCTZ. Called Dr. Tera Helper, who will admit and perform cholecystectomy tomorrow.       Drenda Freeze, MD 04/04/17 [phone]

## 2017-04-04 NOTE — ED Notes (Signed)
US at bedside

## 2017-04-04 NOTE — ED Provider Notes (Signed)
[place] DEPT Provider Note   CSN: [id number] Arrival date & time: 04/04/17  1253   History   Chief Complaint Chief Complaint  Patient presents with  . Abdominal Pain  . Emesis  . Diarrhea  . Hypertension    HPI Ruweyda Macknight is a 66 y.o. female.  Patient has PMH of HTN and HLD who presents for acute onset nausea and vomiting. Patient states she was in usual state of health yesterday and ate tuna salad for dinner last night. She then woke up at 11:00 am with nausea and vomiting. She denies hematemesis and states that whatever she vomit looks exactly like what she ate right before she vomited. She has had a total of 4 episodes of emesis and states that she has had difficulty keeping down water since this started. It is associated with abdominal pain in her lower quadrants, which occurs intermittently and wheShe also endorses 3 episodes loose, brown bowel movements. Denies blood in bowel movements. Endorses subjective chills, but denies fever and recent sick contacts. Patient also denies dysuria, hematuria, chest pain and shortness of breath.     Past Medical History:  Diagnosis Date  . Asthma   . Gastrocnemius muscle rupture    H/O partial right calf rupture.  Marland Kitchen History of migraine headaches   . Hyperlipidemia   . Hypertension   . Low back pain   . Menopause syndrome   . Orthostatic hypotension 02/2010   H/O. Pt seen by [facility], who thought likely 2/2 bp meds - dc'ed her Lisinopril/HCTZ   Patient Active Problem List   Diagnosis Date Noted  . Pain of left leg 12/28/2011  . Seasonal allergies 10/07/2010  . HEADACHE 07/08/2010  . LEG EDEMA, BILATERAL 02/19/2010  . FATIGUE 02/15/2010  . MUSCLE WEAKNESS (GENERALIZED) 02/08/2010  . LOW BACK PAIN, CHRONIC 01/26/2010  . ASTHMA 05/08/2008  . ROTATOR CUFF SYNDROME, LEFT 01/02/2008  . HYPERLIPIDEMIA NEC/NOS 05/14/2007  . HYPERTENSION 05/25/2006  . MENOPAUSAL SYNDROME 05/25/2006  . MIGRAINES, HX OF 05/25/2006    Past Surgical History:  Procedure Laterality Date  . MOUTH SURGERY    . TONSILLECTOMY    . TUBAL LIGATION      OB History    No data available      Home Medications    Prior to Admission medications   Medication Sig Start Date End Date Taking? Authorizing Provider  aspirin EC 81 MG tablet Take 81 mg by mouth daily.   Yes [provider]  fexofenadine (ALLEGRA) 60 MG tablet Take 60 mg by mouth daily.   Yes [provider]  ibuprofen (ADVIL,MOTRIN) 200 MG tablet Take 200 mg by mouth every 6 (six) hours as needed (knee pain).   Yes [provider]  albuterol (VENTOLIN HFA) 108 (90 BASE) MCG/ACT inhaler Inhale 2 puffs into the lungs every 4 (four) hours as needed for wheezing. Patient not taking: Reported on 04/04/2017 12/28/11   Clarene Duke, MD  cetirizine (ZYRTEC) 10 MG tablet Take 1 tablet (10 mg total) by mouth daily. Patient not taking: Reported on 12/05/2014 11/22/12   Charlann Lange, MD  hydrochlorothiazide (HYDRODIURIL) 25 MG tablet TAKE ONE TABLET BY MOUTH ONCE DAILY Patient not taking: Reported on 12/05/2014 08/14/13   Charlann Lange, MD  hydrOXYzine (ATARAX/VISTARIL) 25 MG tablet Take 1 tablet (25 mg total) by mouth every 6 (six) hours. Patient not taking: Reported on 12/05/2014 08/23/12   Margarita Mail, PA-C   Family History Family History  Problem Relation Age of Onset  .  Heart disease Mother        "enlarged heart"  . Heart disease Maternal Uncle        Bypass surg  . Heart attack Maternal Grandmother        also had irregular heart beat requriring a pacemaker.  . Hypertension Other        In multiple relatives.     Social History Social History  Substance Use Topics  . Smoking status: Former Research scientist (life sciences)  . Smokeless tobacco: Never Used  . Alcohol use No    Allergies   Penicillins and Sulfonamide derivatives  Review of Systems Review of Systems  Constitutional: Positive for appetite change and chills. Negative for diaphoresis and fatigue.   HENT: Negative for congestion, sinus pain and sinus pressure.   Respiratory: Negative for cough and shortness of breath.   Cardiovascular: Negative for chest pain, palpitations and leg swelling.  Gastrointestinal: Positive for abdominal pain (lower quadrants), diarrhea, nausea and vomiting. Negative for abdominal distention and blood in stool.  Genitourinary: Negative for decreased urine volume, difficulty urinating, dysuria, hematuria and urgency.  Skin: Negative for color change, pallor and rash.   Physical Exam Updated Vital Signs BP (!) 187/86 (BP Location: Left Arm)   Pulse 85   Temp (!) 97.5 F (36.4 C) (Oral)   Resp 18   LMP 10/07/1990   SpO2 99%   Physical Exam  Constitutional: She appears well-developed and well-nourished. No distress.  HENT:  Mouth/Throat: Oropharynx is clear and moist.  Eyes: Conjunctivae and EOM are normal.  Cardiovascular: Normal rate and regular rhythm.  Exam reveals no friction rub.   No murmur heard. 2+ radial pulses, 2+ dorsalis pedis  Pulmonary/Chest: Effort normal. No respiratory distress. She has no wheezes.  Abdominal: Soft. Bowel sounds are normal. She exhibits no distension and no mass. There is tenderness (diffusely in all quadrants, +Murphy's sign in RUQ). There is no rebound and no guarding.  Musculoskeletal: She exhibits no edema (of bilateral lower extremities) or tenderness (of bilateral lower extremities).  Lymphadenopathy:    She has no cervical adenopathy.  Skin: Skin is warm and dry. Capillary refill takes 2 to 3 seconds. No rash noted. No erythema.   ED Treatments / Results  Labs (all labs ordered are listed, but only abnormal results are displayed) Labs Reviewed  LIPASE, BLOOD  COMPREHENSIVE METABOLIC PANEL  CBC  URINALYSIS, ROUTINE W REFLEX MICROSCOPIC   EKG  EKG Interpretation None      Radiology No results found.  Procedures Procedures (including critical care time)  Medications Ordered in ED Medications -  No data to display  Initial Impression / Assessment and Plan / ED Course  I have reviewed the triage vital signs and the nursing notes.  Pertinent labs & imaging results that were available during my care of the patient were reviewed by me and considered in my medical decision making (see chart for details).  Patient states that she was in usual state of health until she acutely developed nausea, vomiting, and diarrhea a few hours after eating dinner. She denies hematemesis and hematochezia. Patient is afebrile and hypertensive, although patient does not take medication for this at home. She has diffuse tenderness throughout all quadrants on exam, however notices increased tenderness in epigastric region and throughout RUQ. Since patient has tenderness of RUQ on exam and acute onset n/v, will obtain RUQ ultrasound to rule out cholecystitis.   It is possible that the patient has acute food poisoning or gastroenteritis, however will obtain labs  including CBC, CMP, lipase, and urinalysis to rule out infection. Will also give 1L NS bolus and ondansetron to assist with nausea. If above studies return normal, will try PO challenge with ondansetron prior to discharge.  4:45pm: Patient's CBC and urinalysis was not concerning for infection. Patient's lipase and CMP had to be redrawn 2/2 blood clotting. RUQ ultrasound completed and awaiting reading.  Patient discussed with new attending physician at shift change.  Final Clinical Impressions(s) / ED Diagnoses   Final diagnoses:  None    New Prescriptions New Prescriptions   No medications on file     Thomasene Ripple, MD 04/04/17 1710

## 2017-04-05 ENCOUNTER — Observation Stay (HOSPITAL_COMMUNITY): Payer: Self-pay | Admitting: Anesthesiology

## 2017-04-05 ENCOUNTER — Encounter (HOSPITAL_COMMUNITY)
Admission: EM | Disposition: A | Discharge status: Home / Self Care | Payer: Self-pay | Source: Home / Self Care | Attending: Emergency Medicine

## 2017-04-05 ENCOUNTER — Encounter (HOSPITAL_COMMUNITY): Payer: Self-pay | Admitting: Anesthesiology

## 2017-04-05 HISTORY — PX: CHOLECYSTECTOMY: SHX55

## 2017-04-05 LAB — HIV ANTIBODY (ROUTINE TESTING W REFLEX): HIV Screen 4th Generation wRfx: NONREACTIVE

## 2017-04-05 LAB — SURGICAL PCR SCREEN
MRSA, PCR: NEGATIVE
STAPHYLOCOCCUS AUREUS: NEGATIVE

## 2017-04-05 SURGERY — LAPAROSCOPIC CHOLECYSTECTOMY
Anesthesia: General | Site: Abdomen

## 2017-04-05 MED ORDER — LABETALOL HCL 5 MG/ML IV SOLN
INTRAVENOUS | Status: AC
  Filled 2017-04-05: qty 4

## 2017-04-05 MED ORDER — KETOROLAC TROMETHAMINE 15 MG/ML IJ SOLN
15.0000 mg | Freq: Three times a day (TID) | INTRAMUSCULAR | Status: DC | PRN
  Administered 2017-04-05: 15 mg via INTRAVENOUS
  Filled 2017-04-05: qty 1

## 2017-04-05 MED ORDER — PROPOFOL 10 MG/ML IV BOLUS
INTRAVENOUS | Status: AC
  Filled 2017-04-05: qty 20

## 2017-04-05 MED ORDER — LACTATED RINGERS IR SOLN
Status: DC | PRN
  Administered 2017-04-05: 1000 mL

## 2017-04-05 MED ORDER — BUPIVACAINE-EPINEPHRINE 0.25% -1:200000 IJ SOLN
INTRAMUSCULAR | Status: DC | PRN
  Administered 2017-04-05: 30 mL

## 2017-04-05 MED ORDER — HYDROCODONE-ACETAMINOPHEN 5-325 MG PO TABS
1.0000 | ORAL_TABLET | ORAL | Status: DC | PRN
  Administered 2017-04-05 – 2017-04-06 (×2): 1 via ORAL
  Filled 2017-04-05 (×2): qty 1

## 2017-04-05 MED ORDER — FENTANYL CITRATE (PF) 100 MCG/2ML IJ SOLN
INTRAMUSCULAR | Status: AC
  Filled 2017-04-05: qty 2

## 2017-04-05 MED ORDER — OXYCODONE HCL 5 MG PO TABS: 5.0000 mg | ORAL_TABLET | Freq: Once | ORAL | Status: DC | PRN

## 2017-04-05 MED ORDER — SUCCINYLCHOLINE CHLORIDE 200 MG/10ML IV SOSY
PREFILLED_SYRINGE | INTRAVENOUS | Status: AC
  Filled 2017-04-05: qty 10

## 2017-04-05 MED ORDER — PROPOFOL 10 MG/ML IV BOLUS
INTRAVENOUS | Status: DC | PRN
  Administered 2017-04-05: 30 mg via INTRAVENOUS
  Administered 2017-04-05: 20 mg via INTRAVENOUS
  Administered 2017-04-05: 150 mg via INTRAVENOUS

## 2017-04-05 MED ORDER — HYDROMORPHONE HCL-NACL 0.5-0.9 MG/ML-% IV SOSY
0.2500 mg | PREFILLED_SYRINGE | INTRAVENOUS | Status: DC | PRN
  Administered 2017-04-05: 0.5 mg via INTRAVENOUS
  Administered 2017-04-05 (×6): 0.25 mg via INTRAVENOUS

## 2017-04-05 MED ORDER — MIDAZOLAM HCL 2 MG/2ML IJ SOLN
INTRAMUSCULAR | Status: AC
  Filled 2017-04-05: qty 2

## 2017-04-05 MED ORDER — ESMOLOL HCL 100 MG/10ML IV SOLN
INTRAVENOUS | Status: AC
  Filled 2017-04-05: qty 10

## 2017-04-05 MED ORDER — LABETALOL HCL 5 MG/ML IV SOLN
5.0000 mg | Freq: Once | INTRAVENOUS | Status: AC
  Administered 2017-04-05: 5 mg via INTRAVENOUS

## 2017-04-05 MED ORDER — ESMOLOL HCL 100 MG/10ML IV SOLN
INTRAVENOUS | Status: DC | PRN
  Administered 2017-04-05: 10 mg via INTRAVENOUS
  Administered 2017-04-05: 5 mg via INTRAVENOUS
  Administered 2017-04-05: 10 mg via INTRAVENOUS

## 2017-04-05 MED ORDER — PHENYLEPHRINE HCL 10 MG/ML IJ SOLN
INTRAMUSCULAR | Status: DC | PRN
  Administered 2017-04-05: 120 ug via INTRAVENOUS

## 2017-04-05 MED ORDER — BUPIVACAINE-EPINEPHRINE (PF) 0.25% -1:200000 IJ SOLN
INTRAMUSCULAR | Status: AC
  Filled 2017-04-05: qty 30

## 2017-04-05 MED ORDER — MIDAZOLAM HCL 5 MG/5ML IJ SOLN
INTRAMUSCULAR | Status: DC | PRN
  Administered 2017-04-05: 2 mg via INTRAVENOUS

## 2017-04-05 MED ORDER — ROCURONIUM BROMIDE 10 MG/ML (PF) SYRINGE: PREFILLED_SYRINGE | INTRAVENOUS | Status: DC | PRN

## 2017-04-05 MED ORDER — FENTANYL CITRATE (PF) 100 MCG/2ML IJ SOLN
INTRAMUSCULAR | Status: DC | PRN
  Administered 2017-04-05: 50 ug via INTRAVENOUS
  Administered 2017-04-05: 25 ug via INTRAVENOUS
  Administered 2017-04-05: 75 ug via INTRAVENOUS
  Administered 2017-04-05 (×3): 50 ug via INTRAVENOUS

## 2017-04-05 MED ORDER — LACTATED RINGERS IV SOLN
INTRAVENOUS | Status: DC
  Administered 2017-04-05: 14:00:00 via INTRAVENOUS

## 2017-04-05 MED ORDER — SUCCINYLCHOLINE CHLORIDE 200 MG/10ML IV SOSY
PREFILLED_SYRINGE | INTRAVENOUS | Status: DC | PRN
  Administered 2017-04-05: 100 mg via INTRAVENOUS

## 2017-04-05 MED ORDER — METOCLOPRAMIDE HCL 5 MG/ML IJ SOLN: 10.0000 mg | Freq: Once | INTRAMUSCULAR | Status: DC | PRN

## 2017-04-05 MED ORDER — ROCURONIUM BROMIDE 50 MG/5ML IV SOSY
PREFILLED_SYRINGE | INTRAVENOUS | Status: AC
  Filled 2017-04-05: qty 5

## 2017-04-05 MED ORDER — LIDOCAINE 2% (20 MG/ML) 5 ML SYRINGE
INTRAMUSCULAR | Status: AC
  Filled 2017-04-05: qty 5

## 2017-04-05 MED ORDER — SUGAMMADEX SODIUM 200 MG/2ML IV SOLN
INTRAVENOUS | Status: AC
  Filled 2017-04-05: qty 2

## 2017-04-05 MED ORDER — HYDROMORPHONE HCL-NACL 0.5-0.9 MG/ML-% IV SOSY
PREFILLED_SYRINGE | INTRAVENOUS | Status: AC
  Filled 2017-04-05: qty 2

## 2017-04-05 MED ORDER — LABETALOL HCL 5 MG/ML IV SOLN: 5.0000 mg | Freq: Once | INTRAVENOUS | Status: DC

## 2017-04-05 MED ORDER — SUGAMMADEX SODIUM 200 MG/2ML IV SOLN
INTRAVENOUS | Status: DC | PRN
  Administered 2017-04-05: 200 mg via INTRAVENOUS

## 2017-04-05 MED ORDER — ROCURONIUM BROMIDE 10 MG/ML (PF) SYRINGE
PREFILLED_SYRINGE | INTRAVENOUS | Status: DC | PRN
  Administered 2017-04-05: 30 mg via INTRAVENOUS

## 2017-04-05 MED ORDER — FENTANYL CITRATE (PF) 250 MCG/5ML IJ SOLN
INTRAMUSCULAR | Status: AC
  Filled 2017-04-05: qty 5

## 2017-04-05 MED ORDER — OXYCODONE HCL 5 MG/5ML PO SOLN
5.0000 mg | Freq: Once | ORAL | Status: DC | PRN
  Filled 2017-04-05: qty 5

## 2017-04-05 MED ORDER — MEPERIDINE HCL 50 MG/ML IJ SOLN: 6.2500 mg | INTRAMUSCULAR | Status: DC | PRN

## 2017-04-05 MED ORDER — ONDANSETRON HCL 4 MG/2ML IJ SOLN
INTRAMUSCULAR | Status: AC
  Filled 2017-04-05: qty 2

## 2017-04-05 MED ORDER — DEXAMETHASONE SODIUM PHOSPHATE 10 MG/ML IJ SOLN
INTRAMUSCULAR | Status: DC | PRN
Start: 2017-04-05 — End: 2017-04-05
  Administered 2017-04-05: 10 mg via INTRAVENOUS

## 2017-04-05 MED ORDER — DEXAMETHASONE SODIUM PHOSPHATE 10 MG/ML IJ SOLN
INTRAMUSCULAR | Status: AC
  Filled 2017-04-05: qty 1

## 2017-04-05 SURGICAL SUPPLY — 45 items
APL SKNCLS STERI-STRIP NONHPOA (GAUZE/BANDAGES/DRESSINGS) ×1
APPLIER CLIP ROT 10 11.4 M/L (STAPLE)
APR CLP MED LRG 11.4X10 (STAPLE)
BAG SPEC RTRVL 10 TROC 200 (ENDOMECHANICALS) ×1
BANDAGE ADH SHEER 1  50/CT (GAUZE/BANDAGES/DRESSINGS) ×10 IMPLANT
BENZOIN TINCTURE PRP APPL 2/3 (GAUZE/BANDAGES/DRESSINGS) ×2 IMPLANT
CABLE HIGH FREQUENCY MONO STRZ (ELECTRODE) ×2 IMPLANT
CATH CHOLANG 76X19 KUMAR (CATHETERS) IMPLANT
CHLORAPREP W/TINT 26ML (MISCELLANEOUS) ×2 IMPLANT
CLIP APPLIE ROT 10 11.4 M/L (STAPLE) IMPLANT
CLIP VESOLOCK LG 6/CT PURPLE (CLIP) ×1 IMPLANT
CLIP VESOLOCK XL 6/CT (CLIP) ×1 IMPLANT
COVER MAYO STAND STRL (DRAPES) IMPLANT
COVER SURGICAL LIGHT HANDLE (MISCELLANEOUS) ×2 IMPLANT
DECANTER SPIKE VIAL GLASS SM (MISCELLANEOUS) ×2 IMPLANT
DRAIN CHANNEL 19F RND (DRAIN) IMPLANT
DRAPE C-ARM 42X120 X-RAY (DRAPES) IMPLANT
DRAPE LAPAROSCOPIC ABDOMINAL (DRAPES) ×1 IMPLANT
EVACUATOR SILICONE 100CC (DRAIN) IMPLANT
GLOVE BIOGEL PI IND STRL 7.0 (GLOVE) ×1 IMPLANT
GLOVE BIOGEL PI INDICATOR 7.0 (GLOVE) ×1
GLOVE SURG SS PI 7.0 STRL IVOR (GLOVE) ×2 IMPLANT
GOWN STRL REUS W/TWL LRG LVL3 (GOWN DISPOSABLE) ×2 IMPLANT
GOWN STRL REUS W/TWL XL LVL3 (GOWN DISPOSABLE) ×2 IMPLANT
GRASPER SUT TROCAR 14GX15 (MISCELLANEOUS) ×1 IMPLANT
IRRIG SUCT STRYKERFLOW 2 WTIP (MISCELLANEOUS) ×2
IRRIGATION SUCT STRKRFLW 2 WTP (MISCELLANEOUS) ×1 IMPLANT
KIT BASIN OR (CUSTOM PROCEDURE TRAY) ×2 IMPLANT
POUCH RETRIEVAL ECOSAC 10 (ENDOMECHANICALS) ×1 IMPLANT
POUCH RETRIEVAL ECOSAC 10MM (ENDOMECHANICALS) ×1
SCISSORS LAP 5X35 DISP (ENDOMECHANICALS) ×1 IMPLANT
SHEARS HARMONIC ACE PLUS 36CM (ENDOMECHANICALS) IMPLANT
SLEEVE XCEL OPT CAN 5 100 (ENDOMECHANICALS) ×4 IMPLANT
STOPCOCK 4 WAY LG BORE MALE ST (IV SETS) ×1 IMPLANT
STRIP CLOSURE SKIN 1/2X4 (GAUZE/BANDAGES/DRESSINGS) ×2 IMPLANT
SUT ETHILON 2 0 PS N (SUTURE) IMPLANT
SUT MNCRL AB 4-0 PS2 18 (SUTURE) ×2 IMPLANT
SUT VICRYL 0 ENDOLOOP (SUTURE) IMPLANT
SUT VICRYL 0 UR6 27IN ABS (SUTURE) ×1 IMPLANT
TOWEL OR 17X26 10 PK STRL BLUE (TOWEL DISPOSABLE) ×2 IMPLANT
TOWEL OR NON WOVEN STRL DISP B (DISPOSABLE) ×2 IMPLANT
TRAY LAPAROSCOPIC (CUSTOM PROCEDURE TRAY) ×2 IMPLANT
TROCAR BLADELESS OPT 5 100 (ENDOMECHANICALS) ×2 IMPLANT
TROCAR XCEL 12X100 BLDLESS (ENDOMECHANICALS) ×2 IMPLANT
TUBING INSUF HEATED (TUBING) ×2 IMPLANT

## 2017-04-05 NOTE — Progress Notes (Signed)
Dr. Royce Macadamia is at pt bedside at this time to evaluate BP. Order was given.... (See MAR). Pt is alert, calm, and talkative and has no s/s of distress. VS and orders assessed and will continue to monitor and tx pt according MD orders.

## 2017-04-05 NOTE — Anesthesia Preprocedure Evaluation (Addendum)
Anesthesia Evaluation  Patient identified by MRN, date of birth, ID band Patient awake    Reviewed: Allergy & Precautions, NPO status , Patient's Chart, lab work & pertinent test results  Airway Mallampati: II  TM Distance: >3 FB Neck ROM: Full    Dental no notable dental hx. (+) Teeth Intact   Pulmonary asthma , former smoker,    Pulmonary exam normal breath sounds clear to auscultation       Cardiovascular hypertension, Pt. on medications Normal cardiovascular exam Rhythm:Regular Rate:Normal     Neuro/Psych  Headaches, negative psych ROS   GI/Hepatic Neg liver ROS, Cholelithiasis with acute cholecystitis   Endo/Other  Hyprlipidemia  Renal/GU negative Renal ROS  negative genitourinary   Musculoskeletal negative musculoskeletal ROS (+)   Abdominal   Peds  Hematology negative hematology ROS (+)   Anesthesia Other Findings   Reproductive/Obstetrics                             Anesthesia Physical Anesthesia Plan  ASA: II  Anesthesia Plan: General   Post-op Pain Management:    Induction: Intravenous, Rapid sequence and Cricoid pressure planned  PONV Risk Score and Plan: 4 or greater and Ondansetron, Dexamethasone, Midazolam, Scopolamine patch - Pre-op and Propofol infusion  Airway Management Planned: Oral ETT  Additional Equipment:   Intra-op Plan:   Post-operative Plan: Extubation in OR  Informed Consent: I have reviewed the patients History and Physical, chart, labs and discussed the procedure including the risks, benefits and alternatives for the proposed anesthesia with the patient or authorized representative who has indicated his/her understanding and acceptance.   Dental advisory given  Plan Discussed with: CRNA, Anesthesiologist and Surgeon  Anesthesia Plan Comments:         Anesthesia Quick Evaluation

## 2017-04-05 NOTE — Addendum Note (Signed)
Addendum  created 04/05/17 1750 by West Pugh, CRNA   Anesthesia Event edited, Anesthesia Intra Blocks edited, Anesthesia Intra Flowsheets edited, Anesthesia Intra Meds edited, Sign clinical note

## 2017-04-05 NOTE — Progress Notes (Signed)
Pt talked with son on the phone at this time.

## 2017-04-05 NOTE — Anesthesia Procedure Notes (Addendum)
Procedure Name: Intubation Date/Time: 04/05/2017 7:39 AM Performed by: West Pugh Pre-anesthesia Checklist: Patient identified, Emergency Drugs available, Suction available, Patient being monitored and Timeout performed Patient Re-evaluated:Patient Re-evaluated prior to induction Oxygen Delivery Method: Circle system utilized Preoxygenation: Pre-oxygenation with 100% oxygen Induction Type: IV induction, Rapid sequence and Cricoid Pressure applied Laryngoscope Size: Mac and 4 Grade View: Grade II Tube type: Oral Tube size: 7.5 mm Number of attempts: 1 Airway Equipment and Method: Stylet Placement Confirmation: ETT inserted through vocal cords under direct vision,  positive ETCO2,  CO2 detector and breath sounds checked- equal and bilateral Secured at: 22 cm Tube secured with: Tape Dental Injury: Teeth and Oropharynx as per pre-operative assessment

## 2017-04-05 NOTE — Progress Notes (Signed)
Dr. Royce Macadamia notified of pt increased BP.... 199/87 and 199/78. Orders given (See MAR). Pt is alert, calm and has no s/s of distress. VS and orders assessed and will continue to monitor and tx pt according to MD orders.

## 2017-04-05 NOTE — Discharge Instructions (Signed)
[facility] ______[facility] °LAPAROSCOPIC SURGERY: POST OP INSTRUCTIONS °Always review your discharge instruction sheet given to you by the facility where your surgery was performed. °IF YOU HAVE DISABILITY OR FAMILY LEAVE FORMS, YOU MUST BRING THEM TO THE OFFICE FOR PROCESSING.   °DO NOT GIVE THEM TO YOUR DOCTOR. ° °1. A prescription for pain medication may be given to you upon discharge.  Take your pain medication as prescribed, if needed.  If narcotic pain medicine is not needed, then you may take acetaminophen (Tylenol) or ibuprofen (Advil) as needed. °2. Take your usually prescribed medications unless otherwise directed. °3. If you need a refill on your pain medication, please contact your pharmacy.  They will contact our office to request authorization. Prescriptions will not be filled after 5pm or on week-ends. °4. You should follow a light diet the first few days after arrival home, such as soup and crackers, etc.  Be sure to include lots of fluids daily. °5. Most patients will experience some swelling and bruising in the area of the incisions.  Ice packs will help.  Swelling and bruising can take several days to resolve.  °6. It is common to experience some constipation if taking pain medication after surgery.  Increasing fluid intake and taking a stool softener (such as Colace) will usually help or prevent this problem from occurring.  A mild laxative (Milk of Magnesia or Miralax) should be taken according to package instructions if there are no bowel movements after 48 hours. °7. Unless discharge instructions indicate otherwise, you may remove your bandages 24-48 hours after surgery, and you may shower at that time.  You may have steri-strips (small skin tapes) in place directly over the incision.  These strips should be left on the skin for 7-10 days.  If your surgeon used skin glue on the incision, you may shower in 24 hours.  The glue will flake off over the next 2-3 weeks.  Any sutures or  staples will be removed at the office during your follow-up visit. °8. ACTIVITIES:  You may resume regular (light) daily activities beginning the next day--such as daily self-care, walking, climbing stairs--gradually increasing activities as tolerated.  You may have sexual intercourse when it is comfortable.  Refrain from any heavy lifting or straining until approved by your doctor. °a. You may drive when you are no longer taking prescription pain medication, you can comfortably wear a seatbelt, and you can safely maneuver your car and apply brakes. °b. RETURN TO WORK:  __________________________________________________________ °9. You should see your doctor in the office for a follow-up appointment approximately 2-3 weeks after your surgery.  Make sure that you call for this appointment within a day or two after you arrive home to insure a convenient appointment time. °10. OTHER INSTRUCTIONS: __________________________________________________________________________________________________________________________ __________________________________________________________________________________________________________________________ °WHEN TO CALL YOUR DOCTOR: °1. Fever over 101.0 °2. Inability to urinate °3. Continued bleeding from incision. °4. Increased pain, redness, or drainage from the incision. °5. Increasing abdominal pain ° °The clinic staff is available to answer your questions during regular business hours.  Please don’t hesitate to call and ask to speak to one of the nurses for clinical concerns.  If you have a medical emergency, go to the nearest emergency room or call 911.  A surgeon from [facility] is always on call at the hospital. °[address], [address], [place], [place]  [address] ? [address] [address], [place], [place]   [address] °[phone] ? [phone] ? FAX [phone] °Web site:   [email or web address] ° ° °Laparoscopic Cholecystectomy, Care After °This sheet  gives you information about how to care for yourself after your procedure. Your health care provider may also give you more specific instructions. If you have problems or questions, contact your health care provider. °What can I expect after the procedure? °After the procedure, it is common to have: °· Pain at your incision sites. You will be given medicines to control this pain. °· Mild nausea or vomiting. °· Bloating and possible shoulder pain from the air-like gas that was used during the procedure. ° °Follow these instructions at home: °Incision care ° °· Follow instructions from your health care provider about how to take care of your incisions. Make sure you: °? Wash your hands with soap and water before you change your bandage (dressing). If soap and water are not available, use hand sanitizer. °? Change your dressing as told by your health care provider. °? Leave stitches (sutures), skin glue, or adhesive strips in place. These skin closures may need to be in place for 2 weeks or longer. If adhesive strip edges start to loosen and curl up, you may trim the loose edges. Do not remove adhesive strips completely unless your health care provider tells you to do that. °· Do not take baths, swim, or use a hot tub until your health care provider approves. Ask your health care provider if you can take showers. You may only be allowed to take sponge baths for bathing. °· Check your incision area every day for signs of infection. Check for: °? More redness, swelling, or pain. °? More fluid or blood. °? Warmth. °? Pus or a bad smell. °Activity °· Do not drive or use heavy machinery while taking prescription pain medicine. °· Do not lift anything that is heavier than 10 lb (4.5 kg) until your health care provider approves. °· Do not play contact sports until your health care provider approves. °· Do not drive for 24 hours if you were given a medicine to help you relax (sedative). °· Rest as needed. Do not return to work  or school until your health care provider approves. °General instructions °· Take over-the-counter and prescription medicines only as told by your health care provider. °· To prevent or treat constipation while you are taking prescription pain medicine, your health care provider may recommend that you: °? Drink enough fluid to keep your urine clear or pale yellow. °? Take over-the-counter or prescription medicines. °? Eat foods that are high in fiber, such as fresh fruits and vegetables, whole grains, and beans. °? Limit foods that are high in fat and processed sugars, such as fried and sweet foods. °Contact a health care provider if: °· You develop a rash. °· You have more redness, swelling, or pain around your incisions. °· You have more fluid or blood coming from your incisions. °· Your incisions feel warm to the touch. °· You have pus or a bad smell coming from your incisions. °· You have a fever. °· One or more of your incisions breaks open. °Get help right away if: °· You have trouble breathing. °· You have chest pain. °· You have increasing pain in your shoulders. °· You faint or feel dizzy when you stand. °· You have severe pain in your abdomen. °· You have nausea or vomiting that lasts for more than one day. °· You have leg pain. °This information is not intended to replace advice given to you by your health care provider. Make sure you   discuss any questions you have with your health care provider. °Document Released: 07/04/2005 Document Revised: 01/23/2016 Document Reviewed: 12/21/2015 °Elsevier Interactive Patient Education © 2017 [employer] Inc[employer] ° °

## 2017-04-05 NOTE — Care Management Note (Signed)
Case Management Note  Patient Details  Name: Katherine Haynes MRN: [id number] Date of Birth: 1951/06/10  Subjective/Objective:                  Acute Cholecystitis   Action/Plan: Date:  April 05, 2017 Chart reviewed for concurrent status and case management needs.  Will continue to follow patient progress.  Discharge Planning: following for needs  Expected discharge date: [id number]  Velva Harman, BSN, [place], [place]   Expected Discharge Date:   (unknown)               Expected Discharge Plan:  Home/Self Care  In-House Referral:     Discharge planning Services  CM Consult  Post Acute Care Choice:    Choice offered to:     DME Arranged:    DME Agency:     HH Arranged:    [place] Agency:     Status of Service:  In process, will continue to follow  If discussed at Long Length of Stay Meetings, dates discussed:    Additional Comments:  Leeroy Cha, RN 04/05/2017, 9:24 AM

## 2017-04-05 NOTE — Op Note (Signed)
PATIENT:  Katherine Haynes  66 y.o. female  PRE-OPERATIVE DIAGNOSIS:  cholelithiasis  POST-OPERATIVE DIAGNOSIS:  cholelithiasis  PROCEDURE:  Procedure(s): LAPAROSCOPIC CHOLECYSTECTOMY   SURGEON:  Surgeon(s): Kinsinger, Arta Bruce, MD  ASSISTANT: none  ANESTHESIA:   local and general  Indications for procedure: [name] is a 66 y.o. female with symptoms of Abdominal pain and Nausea and vomiting consistent with gallbladder disease, Confirmed by Ultrasound.  Description of procedure: The patient was brought into the operative suite, placed supine. Anesthesia was administered with endotracheal tube. Patient was strapped in place and foot board was secured. All pressure points were offloaded by foam padding. The patient was prepped and draped in the usual sterile fashion.  A small incision was made to the right of the umbilicus. A 31mm trocar was inserted into the peritoneal cavity with optical entry. Pneumoperitoneum was applied with high flow low pressure. 2 25mm trocars were placed in the RUQ. A 71mm trocar was placed in the subxiphoid space. All trocars sites were first anesthesized with 0.25% marcaine with epinephrine in the subcutaneous and preperitoneal layers. Next the patient was placed in reverse trendelenberg. The gallbladder was edematous and inflamed.  The gallbladder was retracted cephalad and lateral. The peritoneum was reflected off the infundibulum working lateral to medial. The cystic duct and cystic artery were identified and further dissection revealed a critical view.  The cystic duct and cystic artery were doubly clipped and ligated.   The gallbladder was removed off the liver bed with cautery. The Gallbladder was placed in a specimen bag. The gallbladder fossa was irrigated and hemostasis was applied with cautery. The gallbladder was removed via the 58mm trocar. The fascial defect was closed with interrupted 0 vicryl suture via laparoscopic trans-fascial suture passer.  Pneumoperitoneum was removed, all trocar were removed. All incisions were closed with 4-0 monocryl subcuticular stitch. The patient woke from anesthesia and was brought to PACU in stable condition. All counts were correct  Findings: inflamed and edematous gallbladder  Specimen: gallbladder  Blood loss: No intake/output data recorded. ml  Local anesthesia: 30 ml 4.9% marcaine  Complications: none  PLAN OF CARE: Admit to inpatient   PATIENT DISPOSITION:  PACU - hemodynamically stable.  Gurney Maxin, M.D. General, Bariatric, & Minimally Invasive Surgery [facility]

## 2017-04-05 NOTE — Transfer of Care (Cosign Needed)
Immediate Anesthesia Transfer of Care Note  Patient: Katherine Haynes  Procedure(s) Performed: Procedure(s): LAPAROSCOPIC CHOLECYSTECTOMY (N/A)  Patient Location: PACU  Anesthesia Type:General  Level of Consciousness: awake  Airway & Oxygen Therapy: Patient Spontanous Breathing  Post-op Assessment: Report given to RN  Post vital signs: Reviewed  Last Vitals:  Vitals:   04/05/17 0634 04/05/17 1535  BP: (!) 157/71   Pulse: 89   Resp: 18   Temp: 36.7 C (P) 36.9 C  SpO2: 100%     Last Pain:  Vitals:   04/05/17 0948  TempSrc:   PainSc: 0-No pain         Complications: No apparent anesthesia complications

## 2017-04-05 NOTE — Addendum Note (Signed)
Addendum  created 04/05/17 1755 by West Pugh, CRNA   Anesthesia Intra Meds edited

## 2017-04-05 NOTE — Anesthesia Postprocedure Evaluation (Signed)
Anesthesia Post Note  Patient: Katherine Haynes  Procedure(s) Performed: Procedure(s) (LRB): LAPAROSCOPIC CHOLECYSTECTOMY (N/A)     Patient location during evaluation: PACU Anesthesia Type: General Level of consciousness: awake and alert Pain management: pain level controlled Vital Signs Assessment: post-procedure vital signs reviewed and stable Respiratory status: spontaneous breathing, nonlabored ventilation, respiratory function stable and patient connected to nasal cannula oxygen Cardiovascular status: blood pressure returned to baseline and stable Postop Assessment: no apparent nausea or vomiting Anesthetic complications: no    Last Vitals:  Vitals:   04/05/17 1615 04/05/17 1625  BP: (!) 199/78   Pulse: 96 85  Resp: 16 13  Temp:    SpO2: 99% 100%    Last Pain:  Vitals:   04/05/17 1625  TempSrc:   PainSc: 5                  Dora Clauss A.

## 2017-04-05 NOTE — Progress Notes (Signed)
  Progress Note: General Surgery Service   Assessment/Plan: Patient Active Problem List   Diagnosis Date Noted  . Cholelithiasis with acute cholecystitis 04/04/2017  . Cholecystitis, acute with cholelithiasis 04/04/2017  . Pain of left leg 12/28/2011  . Seasonal allergies 10/07/2010  . HEADACHE 07/08/2010  . LEG EDEMA, BILATERAL 02/19/2010  . FATIGUE 02/15/2010  . MUSCLE WEAKNESS (GENERALIZED) 02/08/2010  . LOW BACK PAIN, CHRONIC 01/26/2010  . ASTHMA 05/08/2008  . ROTATOR CUFF SYNDROME, LEFT 01/02/2008  . HYPERLIPIDEMIA NEC/NOS 05/14/2007  . HYPERTENSION 05/25/2006  . MENOPAUSAL SYNDROME 05/25/2006  . MIGRAINES, HX OF 05/25/2006   Acute cholecystitis -NPO, IVF, pain control -We discussed the etiology of her pain, we discussed treatment options and recommended surgery. We discussed details of surgery including general anesthesia, laparoscopic approach, identification of cystic duct and common bile duct. Ligation of cystic duct and cystic artery. Possible need for intraoperative cholangiogram or open procedure. Possible risks of common bile duct injury, liver injury, cystic duct leak, bleeding, infection, post-cholecystectomy syndrome. The patient showed good understanding and all questions were answered    LOS: 0 days  Chief Complaint/Subjective: Pain improved, some nausea  Objective: Vital signs in last 24 hours: Temp:  [97.5 F (36.4 C)-98.1 F (36.7 C)] 98.1 F (36.7 C) (09/19 0634) Pulse Rate:  [66-106] 89 (09/19 0634) Resp:  [12-22] 18 (09/19 0634) BP: (152-207)/(62-104) 157/71 (09/19 0634) SpO2:  [97 %-100 %] 100 % (09/19 0634) Weight:  [76.7 kg (169 lb)] 76.7 kg (169 lb) (09/18 2110) Last BM Date: 04/04/17  Intake/Output from previous day: 09/18 0701 - 09/19 0700 In: 2000 [IV Piggyback:2000] Out: 200 [Emesis/NG output:200] Intake/Output this shift: No intake/output data recorded.  Lungs: CTAb  Cardiovascular: RRR  Abd: soft, TTP RUQ, neg murphy's  sign  Extremities: no edema  Neuro: AOx4  Lab Results: CBC   Recent Labs  04/04/17 1435  WBC 10.3  HGB 15.4*  HCT 44.8  PLT 323   BMET  Recent Labs  04/04/17 1714  NA 141  K 3.7  CL 106  CO2 27  GLUCOSE 113*  BUN 7  CREATININE 0.72  CALCIUM 9.2   PT/INR No results for input(s): LABPROT, INR in the last 72 hours. ABG No results for input(s): PHART, HCO3 in the last 72 hours.  Invalid input(s): PCO2, PO2  Studies/Results:  Anti-infectives: Anti-infectives    Start     Dose/Rate Route Frequency Ordered Stop   04/04/17 2000  ciprofloxacin (CIPRO) IVPB 400 mg     400 mg 200 mL/hr over 60 Minutes Intravenous Every 12 hours 04/04/17 1957        Medications: Scheduled Meds: . Influenza vac split quadrivalent PF  0.5 mL Intramuscular Tomorrow-1000  . pneumococcal 23 valent vaccine  0.5 mL Intramuscular Tomorrow-1000   Continuous Infusions: . ciprofloxacin 400 mg (04/05/17 0849)  . dextrose 5 % and 0.45 % NaCl with KCl 20 mEq/L 100 mL/hr at 04/04/17 2034   PRN Meds:.acetaminophen **OR** acetaminophen, HYDROcodone-acetaminophen, HYDROmorphone (DILAUDID) injection, labetalol, ondansetron **OR** ondansetron (ZOFRAN) IV  Mickeal Skinner, MD Pg# [phone] [facility]

## 2017-04-06 MED ORDER — IBUPROFEN 800 MG PO TABS: 800.0000 mg | ORAL_TABLET | Freq: Three times a day (TID) | ORAL | 0 refills | Status: DC | PRN

## 2017-04-06 MED ORDER — HYDROCODONE-ACETAMINOPHEN 5-325 MG PO TABS: 1.0000 | ORAL_TABLET | Freq: Four times a day (QID) | ORAL | 0 refills | Status: DC | PRN

## 2017-04-06 MED ORDER — ONDANSETRON 4 MG PO TBDP: 4.0000 mg | ORAL_TABLET | Freq: Three times a day (TID) | ORAL | 0 refills | Status: DC | PRN

## 2017-04-06 NOTE — Progress Notes (Signed)
Date: April 06, 2017 Discharge orders review for case management needs.  None found  Per patient or family member no additional needs at home. Velva Harman, BSN, [place], CCM:  [phone]

## 2017-04-06 NOTE — Discharge Summary (Signed)
Physician Discharge Summary  Katherine Haynes ZOX:[id number] DOB: July 31, 1950 DOA: 04/04/2017  PCP: Patient, No Pcp Per  Admit date: 04/04/2017 Discharge date: 04/06/2017  Recommendations for Outpatient Follow-up:  1.  (include homehealth, outpatient follow-up instructions, specific recommendations for PCP to follow-up on, etc.)  Follow-up Information    [facility] Follow up on 04/20/2017.   Specialty:  General Surgery Why:  Your appointment is at 2:45 PM.Be at the office 30 minutes early for check-in. Bring your photo ID and insurance information. Contact information: [address] [place] [address] [phone]          Discharge Diagnoses:  Principal Problem:   Cholelithiasis with acute cholecystitis Active Problems:   Cholecystitis, acute with cholelithiasis   Surgical Procedure: lap chole  Discharge Condition: Good Disposition: Home  Diet recommendation: reg diet   Hospital Course:  66 yo female with acute cholecystitis presented to the ER was admitted and underwent lap chole. POD 1 she tolerated a diet and ambulated and was discharged home.  Discharge Instructions  Discharge Instructions    Call MD for:  difficulty breathing, headache or visual disturbances    Complete by:  As directed    Call MD for:  hives    Complete by:  As directed    Call MD for:  persistant nausea and vomiting    Complete by:  As directed    Call MD for:  redness, tenderness, or signs of infection (pain, swelling, redness, odor or green/yellow discharge around incision site)    Complete by:  As directed    Call MD for:  severe uncontrolled pain    Complete by:  As directed    Call MD for:  temperature >100.4    Complete by:  As directed    Diet - low sodium heart healthy    Complete by:  As directed    Discharge wound care:    Complete by:  As directed    Ok to shower tomorrow. Glue will likely peel off in 1-3 weeks. No bandage required   Driving  Restrictions    Complete by:  As directed    No driving while on narcotics   Increase activity slowly    Complete by:  As directed    Lifting restrictions    Complete by:  As directed    No lifting greater than 20 pounds for 3 weeks     Allergies as of 04/06/2017      Reactions   Penicillins Hives   Sulfonamide Derivatives Itching      Medication List    STOP taking these medications   cetirizine 10 MG tablet Commonly known as:  ZYRTEC   hydrOXYzine 25 MG tablet Commonly known as:  ATARAX/VISTARIL     TAKE these medications   albuterol 108 (90 Base) MCG/ACT inhaler Commonly known as:  VENTOLIN HFA Inhale 2 puffs into the lungs every 4 (four) hours as needed for wheezing.   aspirin EC 81 MG tablet Take 81 mg by mouth daily.   fexofenadine 60 MG tablet Commonly known as:  ALLEGRA Take 60 mg by mouth daily.   hydrochlorothiazide 25 MG tablet Commonly known as:  HYDRODIURIL TAKE ONE TABLET BY MOUTH ONCE DAILY   HYDROcodone-acetaminophen 5-325 MG tablet Commonly known as:  NORCO/VICODIN Take 1-2 tablets by mouth every 6 (six) hours as needed for moderate pain.   ibuprofen 800 MG tablet Commonly known as:  ADVIL,MOTRIN Take 1 tablet (800 mg total) by  mouth every 8 (eight) hours as needed. What changed:  medication strength  how much to take  when to take this  reasons to take this   ondansetron 4 MG disintegrating tablet Commonly known as:  ZOFRAN ODT Take 1 tablet (4 mg total) by mouth every 8 (eight) hours as needed for nausea or vomiting.            Discharge Care Instructions        Start     Ordered   04/06/17 0000  Diet - low sodium heart healthy     04/06/17 0754   04/06/17 0000  Increase activity slowly     04/06/17 0754   04/06/17 0000  Driving Restrictions    Comments:  No driving while on narcotics   04/06/17 0754   04/06/17 0000  Lifting restrictions    Comments:  No lifting greater than 20 pounds for 3 weeks   04/06/17 0754    04/06/17 0000  Discharge wound care:    Comments:  Ok to shower tomorrow. Glue will likely peel off in 1-3 weeks. No bandage required   04/06/17 0754   04/06/17 0000  Call MD for:  temperature >100.4     04/06/17 0754   04/06/17 0000  Call MD for:  persistant nausea and vomiting     04/06/17 0754   04/06/17 0000  Call MD for:  severe uncontrolled pain     04/06/17 0754   04/06/17 0000  Call MD for:  redness, tenderness, or signs of infection (pain, swelling, redness, odor or green/yellow discharge around incision site)     04/06/17 0754   04/06/17 0000  Call MD for:  difficulty breathing, headache or visual disturbances     04/06/17 0754   04/06/17 0000  Call MD for:  hives     04/06/17 0754   04/06/17 0000  ibuprofen (ADVIL,MOTRIN) 800 MG tablet  Every 8 hours PRN     04/06/17 0754   04/06/17 0000  HYDROcodone-acetaminophen (NORCO/VICODIN) 5-325 MG tablet  Every 6 hours PRN     04/06/17 0754   04/06/17 0000  ondansetron (ZOFRAN ODT) 4 MG disintegrating tablet  Every 8 hours PRN     04/06/17 0754     Follow-up Information    [facility] Follow up on 04/20/2017.   Specialty:  General Surgery Why:  Your appointment is at 2:45 PM.Be at the office 30 minutes early for check-in. Bring your photo ID and insurance information. Contact information: [address] [place] [address] [phone]            The results of significant diagnostics from this hospitalization (including imaging, microbiology, ancillary and laboratory) are listed below for reference.    Significant Diagnostic Studies: US Abdomen Limited Ruq  Result Date: 04/04/2017 CLINICAL DATA:  66 year old female with vomiting and diarrhea with mid abdominal pain since last night. Initial encounter. EXAM: ULTRASOUND ABDOMEN LIMITED RIGHT UPPER QUADRANT COMPARISON:  None. FINDINGS: Gallbladder: 1.6 cm gallstone lodged in the gallbladder neck. Gallbladder wall thickening measuring up to 7.2 mm.  Question tiny amount of pericholecystic fluid. Patient was slightly tender over this region during scanning. Common bile duct: Diameter: 4.7 mm Liver: Exam is slightly limited. No focal lesion identified. Within normal limits in parenchymal echogenicity. Portal vein is patent on color Doppler imaging with normal direction of blood flow towards the liver. IMPRESSION: Findings suspicious for acute cholecystitis. 1.6 cm gallstone lodged in the gallbladder neck. Gallbladder wall thickening.  Question tiny noted peri cholecystic fluid. Slightly limited evaluation of the liver without obvious abnormality. These results were called by telephone at the time of interpretation on 04/04/2017 at 5:09 pm to Dr. Silverio Lay, who verbally acknowledged these results. Electronically Signed   By: Lacy Duverney M.D.   On: 04/04/2017 17:13    Labs: Basic Metabolic Panel:  Recent Labs Lab 04/04/17 1714  NA 141  K 3.7  CL 106  CO2 27  GLUCOSE 113*  BUN 7  CREATININE 0.72  CALCIUM 9.2   Liver Function Tests:  Recent Labs Lab 04/04/17 1714  AST 36  ALT 30  ALKPHOS 79  BILITOT 0.5  PROT 7.7  ALBUMIN 4.2    CBC:  Recent Labs Lab 04/04/17 1435  WBC 10.3  HGB 15.4*  HCT 44.8  MCV 91.1  PLT 323    CBG: No results for input(s): GLUCAP in the last 168 hours.  Principal Problem:   Cholelithiasis with acute cholecystitis Active Problems:   Cholecystitis, acute with cholelithiasis   Time coordinating discharge: <79min

## 2017-04-25 ENCOUNTER — Ambulatory Visit (INDEPENDENT_AMBULATORY_CARE_PROVIDER_SITE_OTHER): Payer: Self-pay | Admitting: Internal Medicine

## 2017-04-25 ENCOUNTER — Encounter: Payer: Self-pay | Admitting: Internal Medicine

## 2017-04-25 VITALS — BP 170/88 | HR 86 | Temp 97.6°F | Ht 66.0 in | Wt 152.3 lb

## 2017-04-25 DIAGNOSIS — Z880 Allergy status to penicillin: Secondary | ICD-10-CM

## 2017-04-25 DIAGNOSIS — E785 Hyperlipidemia, unspecified: Secondary | ICD-10-CM

## 2017-04-25 DIAGNOSIS — Z0000 Encounter for general adult medical examination without abnormal findings: Secondary | ICD-10-CM | POA: Insufficient documentation

## 2017-04-25 DIAGNOSIS — Z23 Encounter for immunization: Secondary | ICD-10-CM

## 2017-04-25 DIAGNOSIS — Z91041 Radiographic dye allergy status: Secondary | ICD-10-CM

## 2017-04-25 DIAGNOSIS — I10 Essential (primary) hypertension: Secondary | ICD-10-CM

## 2017-04-25 HISTORY — DX: Encounter for general adult medical examination without abnormal findings: Z00.00

## 2017-04-25 MED ORDER — HYDROCHLOROTHIAZIDE 25 MG PO TABS: 25.0000 mg | ORAL_TABLET | Freq: Every day | ORAL | 2 refills | Status: DC

## 2017-04-25 NOTE — Progress Notes (Signed)
Internal Medicine Clinic Attending  Case discussed with Dr. Patel at the time of the visit.  We reviewed the resident's history and exam and pertinent patient test results.  I agree with the assessment, diagnosis, and plan of care documented in the resident's note.  

## 2017-04-25 NOTE — Assessment & Plan Note (Signed)
BP Readings from Last 3 Encounters:  04/25/17 (!) 170/88  04/06/17 (!) 156/82  12/05/14 169/85   BP elevated today. Will restart HCTZ 25 mg daily. She is advised to check her BP at home at least 3 times a week and bring a log on follow up. - HCTZ 25 mg daily - f/u in 4-6 weeks for recheck and BMET

## 2017-04-25 NOTE — Patient Instructions (Signed)
It was a pleasure to meet you Ms. Dowdy.  We will restart Hydrochlorothiazide (HCTZ) for your blood pressure. - Take HCTZ 25 mg once daily - Try to check your blood pressure 3 times a week and bring a log of your readings with you to your follow up appointment  We have given you the flu shot today and referred you for a screening mammogram  We can work on referring you for screening colonoscopy and checking your cholesterol levels on follow up.  Please follow up with [place] in 4-6 weeks or sooner if needed.    DASH Eating Plan DASH stands for "Dietary Approaches to Stop Hypertension." The DASH eating plan is a healthy eating plan that has been shown to reduce high blood pressure (hypertension). It may also reduce your risk for type 2 diabetes, heart disease, and stroke. The DASH eating plan may also help with weight loss. What are tips for following this plan? General guidelines  Avoid eating more than 2,300 mg (milligrams) of salt (sodium) a day. If you have hypertension, you may need to reduce your sodium intake to 1,500 mg a day.  Limit alcohol intake to no more than 1 drink a day for nonpregnant women and 2 drinks a day for men. One drink equals 12 oz of beer, 5 oz of wine, or 1 oz of hard liquor.  Work with your health care provider to maintain a healthy body weight or to lose weight. Ask what an ideal weight is for you.  Get at least 30 minutes of exercise that causes your heart to beat faster (aerobic exercise) most days of the week. Activities may include walking, swimming, or biking.  Work with your health care provider or diet and nutrition specialist (dietitian) to adjust your eating plan to your individual calorie needs. Reading food labels  Check food labels for the amount of sodium per serving. Choose foods with less than 5 percent of the Daily Value of sodium. Generally, foods with less than 300 mg of sodium per serving fit into this eating plan.  To find whole grains,  look for the word "whole" as the first word in the ingredient list. Shopping  Buy products labeled as "low-sodium" or "no salt added."  Buy fresh foods. Avoid canned foods and premade or frozen meals. Cooking  Avoid adding salt when cooking. Use salt-free seasonings or herbs instead of table salt or sea salt. Check with your health care provider or pharmacist before using salt substitutes.  Do not fry foods. Cook foods using healthy methods such as baking, boiling, grilling, and broiling instead.  Cook with heart-healthy oils, such as olive, canola, soybean, or sunflower oil. Meal planning   Eat a balanced diet that includes: ? 5 or more servings of fruits and vegetables each day. At each meal, try to fill half of your plate with fruits and vegetables. ? Up to 6-8 servings of whole grains each day. ? Less than 6 oz of lean meat, poultry, or fish each day. A 3-oz serving of meat is about the same size as a deck of cards. One egg equals 1 oz. ? 2 servings of low-fat dairy each day. ? A serving of nuts, seeds, or beans 5 times each week. ? Heart-healthy fats. Healthy fats called Omega-3 fatty acids are found in foods such as flaxseeds and coldwater fish, like sardines, salmon, and mackerel.  Limit how much you eat of the following: ? Canned or prepackaged foods. ? Food that is high in trans  fat, such as fried foods. ? Food that is high in saturated fat, such as fatty meat. ? Sweets, desserts, sugary drinks, and other foods with added sugar. ? Full-fat dairy products.  Do not salt foods before eating.  Try to eat at least 2 vegetarian meals each week.  Eat more home-cooked food and less restaurant, buffet, and fast food.  When eating at a restaurant, ask that your food be prepared with less salt or no salt, if possible. What foods are recommended? The items listed may not be a complete list. Talk with your dietitian about what dietary choices are best for you. Grains Whole-grain  or whole-wheat bread. Whole-grain or whole-wheat pasta. Brown rice. [place]. Bulgur. Whole-grain and low-sodium cereals. Pita bread. Low-fat, low-sodium crackers. Whole-wheat flour tortillas. Vegetables Fresh or frozen vegetables (raw, steamed, roasted, or grilled). Low-sodium or reduced-sodium tomato and vegetable juice. Low-sodium or reduced-sodium tomato sauce and tomato paste. Low-sodium or reduced-sodium canned vegetables. Fruits All fresh, dried, or frozen fruit. Canned fruit in natural juice (without added sugar). Meat and other protein foods Skinless chicken or [place]. Ground chicken or [place]. Pork with fat trimmed off. Fish and seafood. Egg whites. Dried beans, peas, or lentils. Unsalted nuts, nut butters, and seeds. Unsalted canned beans. Lean cuts of beef with fat trimmed off. Low-sodium, lean deli meat. Dairy Low-fat (1%) or fat-free (skim) milk. Fat-free, low-fat, or reduced-fat cheeses. Nonfat, low-sodium ricotta or cottage cheese. Low-fat or nonfat yogurt. Low-fat, low-sodium cheese. Fats and oils Soft margarine without trans fats. Vegetable oil. Low-fat, reduced-fat, or light mayonnaise and salad dressings (reduced-sodium). Canola, safflower, olive, soybean, and sunflower oils. Avocado. Seasoning and other foods Herbs. Spices. Seasoning mixes without salt. Unsalted popcorn and pretzels. Fat-free sweets. What foods are not recommended? The items listed may not be a complete list. Talk with your dietitian about what dietary choices are best for you. Grains Baked goods made with fat, such as croissants, muffins, or some breads. Dry pasta or rice meal packs. Vegetables Creamed or fried vegetables. Vegetables in a cheese sauce. Regular canned vegetables (not low-sodium or reduced-sodium). Regular canned tomato sauce and paste (not low-sodium or reduced-sodium). Regular tomato and vegetable juice (not low-sodium or reduced-sodium). Angie Fava. Olives. Fruits Canned fruit in a  light or heavy syrup. Fried fruit. Fruit in cream or butter sauce. Meat and other protein foods Fatty cuts of meat. Ribs. Fried meat. Berniece Salines. Sausage. Bologna and other processed lunch meats. Salami. Fatback. Hotdogs. Bratwurst. Salted nuts and seeds. Canned beans with added salt. Canned or smoked fish. Whole eggs or egg yolks. Chicken or [place] with skin. Dairy Whole or 2% milk, cream, and half-and-half. Whole or full-fat cream cheese. Whole-fat or sweetened yogurt. Full-fat cheese. Nondairy creamers. Whipped toppings. Processed cheese and cheese spreads. Fats and oils Butter. Stick margarine. Lard. Shortening. Ghee. Bacon fat. Tropical oils, such as coconut, palm kernel, or palm oil. Seasoning and other foods Salted popcorn and pretzels. Onion salt, garlic salt, seasoned salt, table salt, and sea salt. Worcestershire sauce. Tartar sauce. Barbecue sauce. Teriyaki sauce. Soy sauce, including reduced-sodium. Steak sauce. Canned and packaged gravies. Fish sauce. Oyster sauce. Cocktail sauce. Horseradish that you find on the shelf. Ketchup. Mustard. Meat flavorings and tenderizers. Bouillon cubes. Hot sauce and Tabasco sauce. Premade or packaged marinades. Premade or packaged taco seasonings. Relishes. Regular salad dressings. Where to find more information:  National Heart, Lung, and [place]: https://wilson-eaton.com/  American Heart Association: www.heart.org Summary  The DASH eating plan is a healthy eating plan that has  been shown to reduce high blood pressure (hypertension). It may also reduce your risk for type 2 diabetes, heart disease, and stroke.  With the DASH eating plan, you should limit salt (sodium) intake to 2,300 mg a day. If you have hypertension, you may need to reduce your sodium intake to 1,500 mg a day.  When on the DASH eating plan, aim to eat more fresh fruits and vegetables, whole grains, lean proteins, low-fat dairy, and heart-healthy fats.  Work with your health care  provider or diet and nutrition specialist (dietitian) to adjust your eating plan to your individual calorie needs. This information is not intended to replace advice given to you by your health care provider. Make sure you discuss any questions you have with your health care provider. Document Released: 06/23/2011 Document Revised: 06/27/2016 Document Reviewed: 06/27/2016 Elsevier Interactive Patient Education  2017 [employer].

## 2017-04-25 NOTE — Assessment & Plan Note (Signed)
ASCVD in 2013 was <4% and she was managed with lifestyle modifications. Will recommend continuing with lifestyle changes, including diet and exercise. Can consider rechecking lipid profile on follow up.

## 2017-04-25 NOTE — Assessment & Plan Note (Signed)
Flu shot given today. Will refer for screening mammogram. She is working with our Development worker, community to obtain insurance at which time she is interested in referral for colonoscopy and Hepatitis C screening.

## 2017-04-25 NOTE — Progress Notes (Signed)
CC: HTN  HPI:  Katherine Haynes is a 66 y.o. female with PMH of HTN, HLD, and seasonal allergies who presents to reestablish care for management of HTN.  HTN: Patient with a long standing history of hypertension. Previously seen by [facility], last visit 4 years ago. She is not currently on antihypertensives. Per chart review, she had complications of orthostatic hypotension when on combined HCTZ/ACE-I therapy. She reports tolerating HCTZ alone well. She says she can check her blood pressure at home.  HLD: Was on statin therapy years ago. Her ASCVD risk when seen about ~ 5 years ago was < 4% and she was managed with lifestyle modifications at the time.  Healthcare Maintenance: Patient due for flu shot, mammogram, colonoscopy, HCV screen, and pap smear. She is working with our Development worker, community to [employer] and will wait until then to obtain some of these.  Surgical Hx: Laparoscopic cholecystectomy 04/05/17. Hx of tubal ligation Hx of tonsillectomy  Social Hx: Lives in [place]. Retired former [employer]ar[employer] manager. Former smoker of 1 PPD for 2-3 years, quit about 37 years ago. Denies alcohol or illicit drug use.  Allergies: Penicillins - rash/hives per patient Contrast - rash per patient   Past Medical History:  Diagnosis Date  . Hyperlipidemia   . Hypertension   . Menopause syndrome    Review of Systems:   Review of Systems  Constitutional: Negative for chills and fever.  Respiratory: Negative for shortness of breath and wheezing.   Cardiovascular: Negative for chest pain and palpitations.  Gastrointestinal: Negative for blood in stool, constipation, diarrhea, melena and vomiting.       Occasional nausea and abdominal pain with meals  Musculoskeletal: Negative for falls.  Neurological: Negative for loss of consciousness.   Family History  Problem Relation Age of Onset  . Heart disease Mother        "enlarged heart"  . Hypertension Mother   . Diabetes Mother   . Heart disease  Maternal Uncle        Bypass surg  . Heart attack Maternal Grandmother        also had irregular heart beat requriring a pacemaker.  . Hypertension Other        In multiple relatives.   . Hypertension Paternal Grandmother     Physical Exam:  Vitals:   04/25/17 0851  BP: (!) 170/88  Pulse: 86  Temp: 97.6 F (36.4 C)  TempSrc: Oral  SpO2: 100%  Weight: 152 lb 4.8 oz (69.1 kg)  Height: 5\' 6"  (1.676 m)   Physical Exam  Constitutional: She is oriented to person, place, and time. She appears well-developed and well-nourished. No distress.  HENT:  Head: Normocephalic and atraumatic.  Cardiovascular: Normal rate, regular rhythm and intact distal pulses.   No murmur heard. Pulmonary/Chest: Effort normal. No respiratory distress. She has no wheezes. She has no rales.  Abdominal: Soft. Bowel sounds are normal. She exhibits no distension. There is no guarding.  Mild tenderness epigastric region. 4 laparoscopic scars well healed.  Musculoskeletal: Normal range of motion. She exhibits no tenderness.  Neurological: She is alert and oriented to person, place, and time.  Skin: Skin is warm. No rash noted. She is not diaphoretic.  Psychiatric: She has a normal mood and affect.    Assessment & Plan:   See Encounters Tab for problem based charting.  Patient discussed with Dr. Angelia Mould;  Essential hypertension BP Readings from Last 3 Encounters:  04/25/17 (!) 170/88  04/06/17 (!) 156/82  12/05/14 169/85  BP elevated today. Will restart HCTZ 25 mg daily. She is advised to check her BP at home at least 3 times a week and bring a log on follow up. - HCTZ 25 mg daily - f/u in 4-6 weeks for recheck and BMET  Hyperlipidemia ASCVD in 2013 was <4% and she was managed with lifestyle modifications. Will recommend continuing with lifestyle changes, including diet and exercise. Can consider rechecking lipid profile on follow up.  Healthcare maintenance Flu shot given today. Will refer for  screening mammogram. She is working with our Development worker, community to obtain insurance at which time she is interested in referral for colonoscopy and Hepatitis C screening.

## 2017-05-01 ENCOUNTER — Ambulatory Visit (INDEPENDENT_AMBULATORY_CARE_PROVIDER_SITE_OTHER): Payer: Self-pay | Admitting: Internal Medicine

## 2017-05-01 ENCOUNTER — Telehealth: Payer: Self-pay | Admitting: Internal Medicine

## 2017-05-01 ENCOUNTER — Encounter: Payer: Self-pay | Admitting: Internal Medicine

## 2017-05-01 VITALS — BP 149/72 | HR 86 | Temp 98.0°F | Wt 148.7 lb

## 2017-05-01 DIAGNOSIS — E785 Hyperlipidemia, unspecified: Secondary | ICD-10-CM

## 2017-05-01 DIAGNOSIS — I951 Orthostatic hypotension: Secondary | ICD-10-CM

## 2017-05-01 DIAGNOSIS — Z9049 Acquired absence of other specified parts of digestive tract: Secondary | ICD-10-CM | POA: Insufficient documentation

## 2017-05-01 DIAGNOSIS — I10 Essential (primary) hypertension: Secondary | ICD-10-CM

## 2017-05-01 DIAGNOSIS — Z79899 Other long term (current) drug therapy: Secondary | ICD-10-CM

## 2017-05-01 LAB — CBC WITH DIFFERENTIAL/PLATELET
BASOS ABS: 0 10*3/uL (ref 0.0–0.1)
Basophils Relative: 1 %
Eosinophils Absolute: 0.1 10*3/uL (ref 0.0–0.7)
Eosinophils Relative: 2 %
HEMATOCRIT: 43.2 % (ref 36.0–46.0)
Hemoglobin: 14.4 g/dL (ref 12.0–15.0)
LYMPHS ABS: 3 10*3/uL (ref 0.7–4.0)
LYMPHS PCT: 46 %
MCH: 30.4 pg (ref 26.0–34.0)
MCHC: 33.3 g/dL (ref 30.0–36.0)
MCV: 91.1 fL (ref 78.0–100.0)
MONO ABS: 0.6 10*3/uL (ref 0.1–1.0)
MONOS PCT: 9 %
NEUTROS ABS: 2.7 10*3/uL (ref 1.7–7.7)
Neutrophils Relative %: 42 %
Platelets: 291 10*3/uL (ref 150–400)
RBC: 4.74 MIL/uL (ref 3.87–5.11)
RDW: 12.2 % (ref 11.5–15.5)
WBC: 6.5 10*3/uL (ref 4.0–10.5)

## 2017-05-01 LAB — COMPREHENSIVE METABOLIC PANEL
ALT: 30 U/L (ref 14–54)
AST: 33 U/L (ref 15–41)
Albumin: 4.1 g/dL (ref 3.5–5.0)
Alkaline Phosphatase: 81 U/L (ref 38–126)
Anion gap: 9 (ref 5–15)
BUN: 16 mg/dL (ref 6–20)
CHLORIDE: 96 mmol/L — AB (ref 101–111)
CO2: 30 mmol/L (ref 22–32)
Calcium: 9.5 mg/dL (ref 8.9–10.3)
Creatinine, Ser: 1.08 mg/dL — ABNORMAL HIGH (ref 0.44–1.00)
GFR calc Af Amer: >60 mL/min (ref >60)
GFR, EST NON AFRICAN AMERICAN: 53 mL/min — AB (ref >60)
GLUCOSE: 124 mg/dL — AB (ref 65–99)
POTASSIUM: 3.6 mmol/L (ref 3.5–5.1)
Sodium: 135 mmol/L (ref 135–145)
Total Bilirubin: 0.7 mg/dL (ref 0.3–1.2)
Total Protein: 7.3 g/dL (ref 6.5–8.1)

## 2017-05-01 LAB — LIPASE, BLOOD: LIPASE: 27 U/L (ref 11–51)

## 2017-05-01 MED ORDER — SODIUM CHLORIDE 0.9 % IV BOLUS (SEPSIS): 500.0000 mL | Freq: Once | INTRAVENOUS | Status: DC

## 2017-05-01 NOTE — Telephone Encounter (Signed)
Pt calls and states since starting hctz she is dizzy, weak, irregular HR and short of breath with exertion. She also states she has diarrhea but that it could be associated w/ just recently having a cholecystectomy.  She is advised to either go to ED or urg care, she states she does not have money for that care, appt w/ dr Philipp Ovens this pm at 1545, she is in process of completing paperwork for [employer]

## 2017-05-01 NOTE — Progress Notes (Signed)
   CC: Dizziness, N/V, abdominal pain   HPI:  Katherine Haynes is a 66 y.o. female with past medical history outlined below here with complaint of dizziness, N/V and abdominal pain. For the details of today's visit, please refer to the assessment and plan.  Past Medical History:  Diagnosis Date  . Hyperlipidemia   . Hypertension   . Menopause syndrome     Review of Systems  Constitutional: Negative for chills and fever.  Gastrointestinal: Positive for abdominal pain, nausea and vomiting.  Neurological: Positive for dizziness.    Physical Exam:  Vitals:   05/01/17 1606  BP: 132/79  Pulse: (!) 101  Temp: 98 F (36.7 C)  TempSrc: Oral  SpO2: 99%  Weight: 148 lb 11.2 oz (67.4 kg)    Constitutional: NAD, appears comfortable  Cardiovascular: RRR, no murmurs, rubs, or gallops.  Pulmonary/Chest: CTAB, no wheezes, rales, or rhonchi.  Abdominal: Soft, non tender, non distended, no rebound tenderness. Hypoactive bowel sounds. Incision well healed, c/d/i.  Extremities: Warm and well perfused. No edema.  Psychiatric: Normal mood and affect  Assessment & Plan:   See Encounters Tab for problem based charting.  Patient discussed with Dr. Daryll Drown

## 2017-05-01 NOTE — Patient Instructions (Addendum)
Ms. Katherine Haynes,  It was a pleasure to see you today. Please drink plenty of fluids over the next couple days to stay hydrated. I will call you with the results of your blood work today. Please stop taking hydrochlorothiazide and follow up with [place] in 2 weeks for your blood pressure. If you have any questions or concerns, call our clinic at [phone] or after hours call [phone] and ask for the internal medicine resident on call. Thank you!  - Dr. Philipp Ovens

## 2017-05-01 NOTE — Telephone Encounter (Signed)
Thank you Helen 

## 2017-05-02 NOTE — Assessment & Plan Note (Addendum)
Patient underwent an uncomplicated lap chole on 04/05/2017 after presenting to the ED with acute cholecystitis. Since discharge, she reports on-going issues with nausea, vomiting, and abdominal pain. She has been unable to tolerate much PO aside from a liquid diet due to pain and nausea. She reports regular bowel movements with one episode of diarrhea this morning. Exam is reassuring and incision appears well healed. Unclear cause for on-going symptoms x 1 month after cholecystectomy. Possibly developing a post cholecystectomy syndrome vs. Peritonitis. Will check labs.  -- F/u CMP, CBC, lipase   ADDENDUM: CBC and lipase WNL, CMP normal aside from mild AKI. Called patient with results. Follow up scheduled 1 month.

## 2017-05-02 NOTE — Assessment & Plan Note (Addendum)
Patient is presenting with postural dizziness worse with standing and walking. Episodes of dizziness are transient and improved when she sits down. Of note, she is status post laparoscopic cholecystectomy one month ago and was recently started on HCTZ 2 weeks ago for her high blood pressure. Since her surgery, patient reports ongoing issues with abdominal pain, nausea, and vomiting and has been unable to tolerate much by mouth. She has essentially stuck to a liquid diet as solid foods worsen her pain and nausea. Orthostatics today in clinic are positive, supine BP 135/80, HR 99 --> sitting BP 150/78, HR 102 --> standing 130/76, HR 118. She was given a 500 cc bolus and repeat orthostatics normalized. Patient is likely volume depleted in the setting of poor PO intake after her surgery and recent addition of HCTZ.  -- S/p 500 cc bolus -- Discontinue HCTZ -- Encourage PO fluids  -- Follow up 2 weeks

## 2017-05-02 NOTE — Assessment & Plan Note (Signed)
Patient has a history of hypertension recently started on HCTZ 25 mg daily 2 weeks ago. Unfortunately patient has developed orthostatic hypotension with symptoms of postural dizziness requiring 500 mL normal saline bolus today in clinic. Patient reports being prescribed lisinopril-HCTZ many years ago for high blood pressure that was stopped for similar reasons, also requiring IV fluids. Given her ongoing poor by mouth intake after her cholecystecomy, we will discontinue HCTZ and monitor for now. Encourage PO intake.  Plan for follow up in 1 week, consider starting non diuretic if BP persistently elevated.  -- Discontinue HCTZ -- Follow up 1 week

## 2017-05-04 NOTE — Progress Notes (Signed)
Internal Medicine Clinic Attending  Case discussed with Dr. Guilloud at the time of the visit.  We reviewed the resident's history and exam and pertinent patient test results.  I agree with the assessment, diagnosis, and plan of care documented in the resident's note.  

## 2017-05-08 ENCOUNTER — Ambulatory Visit: Payer: Medicaid Other

## 2017-05-17 ENCOUNTER — Ambulatory Visit: Payer: Medicaid Other

## 2017-06-05 ENCOUNTER — Ambulatory Visit: Payer: Medicaid Other

## 2017-06-05 ENCOUNTER — Encounter: Payer: Medicaid Other | Admitting: Internal Medicine

## 2017-06-07 ENCOUNTER — Ambulatory Visit: Payer: Medicaid Other

## 2017-06-12 ENCOUNTER — Ambulatory Visit (INDEPENDENT_AMBULATORY_CARE_PROVIDER_SITE_OTHER): Payer: Self-pay | Admitting: Internal Medicine

## 2017-06-12 ENCOUNTER — Encounter: Payer: Self-pay | Admitting: Internal Medicine

## 2017-06-12 ENCOUNTER — Other Ambulatory Visit: Payer: Self-pay

## 2017-06-12 VITALS — BP 176/86 | HR 84 | Temp 98.0°F | Ht 66.0 in | Wt 151.2 lb

## 2017-06-12 DIAGNOSIS — I10 Essential (primary) hypertension: Secondary | ICD-10-CM

## 2017-06-12 DIAGNOSIS — Z87891 Personal history of nicotine dependence: Secondary | ICD-10-CM

## 2017-06-12 DIAGNOSIS — Z0000 Encounter for general adult medical examination without abnormal findings: Secondary | ICD-10-CM

## 2017-06-12 MED ORDER — LISINOPRIL 20 MG PO TABS: 20.0000 mg | ORAL_TABLET | Freq: Every day | ORAL | 1 refills | Status: DC

## 2017-06-12 NOTE — Assessment & Plan Note (Signed)
Patient declined Pap smear today. She is agreeable to fecal occult testing for colon cancer screening. Will refer for mammogram. -- Stool cards provided -- Screening mammogram referral placed

## 2017-06-12 NOTE — Progress Notes (Signed)
   CC: BP follow up  HPI:  [name] is a 66 y.o. female with past medical history outlined below here for BP follow up. For the details of today's visit, please refer to the assessment and plan.  Past Medical History:  Diagnosis Date  . Hyperlipidemia   . Hypertension   . Menopause syndrome     Review of Systems  Neurological: Negative for dizziness and headaches.    Physical Exam:  Vitals:   06/12/17 1410  BP: (!) 176/86  Pulse: 84  Temp: 98 F (36.7 C)  TempSrc: Oral  SpO2: 100%  Weight: 151 lb 3.2 oz (68.6 kg)  Height: 5\' 6"  (1.676 m)    Constitutional: NAD, appears comfortable Cardiovascular: RRR, no murmurs, rubs, or gallops.  Pulmonary/Chest: CTAB, no wheezes, rales, or rhonchi.  Extremities: Warm and well perfused.  No edema.  Psychiatric: Normal mood and affect  Assessment & Plan:   See Encounters Tab for problem based charting.  Patient discussed with Dr. Daryll Drown

## 2017-06-12 NOTE — Assessment & Plan Note (Signed)
Patient was last seen 1 month ago for her blood pressure. At that time she had recently been started on HCTZ 25 MG daily, unfortunately developed postural dizziness and orthostatic hypotension. She was given an 500 cc NS bolus in clinic and HCTZ was discontinued. She is here today for follow-up. Dizziness has resolved. Blood pressure today is elevated, 176/86. She was previously prescribed lisinopril-HCTZ a few years ago which she also did not tolerate due to orthostatic hypotension. I think this is likely secondary to diuretic effect of HCTZ. She is agreeable to trying lisinopril alone. Will avoid diuretics in the future given her sensitivity.   -- Start lisinopril 20 mg daily  -- F/u 1 month

## 2017-06-12 NOTE — Patient Instructions (Signed)
Ms. Katherine Haynes,   It was a pleasure to see you today. I am glad you are doing well! For your blood pressure, I have started you on lisinopril 20 mg daily. Please take this as prescribed and follow up with me in 1 month. You will be called to schedule your mammogram. Please stop by the lab for instructions on your colon cancer screening cards. If you have any questions or concerns, call our clinic at [phone] or after hours call [phone] and ask for the internal medicine resident on call. Thank you!  - Dr. Philipp Ovens

## 2017-06-12 NOTE — Progress Notes (Signed)
Internal Medicine Clinic Attending  Case discussed with Dr. Guilloud at the time of the visit.  We reviewed the resident's history and exam and pertinent patient test results.  I agree with the assessment, diagnosis, and plan of care documented in the resident's note.  

## 2017-07-03 ENCOUNTER — Ambulatory Visit (INDEPENDENT_AMBULATORY_CARE_PROVIDER_SITE_OTHER): Payer: Self-pay | Admitting: Internal Medicine

## 2017-07-03 ENCOUNTER — Encounter: Payer: Self-pay | Admitting: Internal Medicine

## 2017-07-03 ENCOUNTER — Other Ambulatory Visit: Payer: Self-pay

## 2017-07-03 VITALS — BP 157/94 | HR 87 | Temp 98.2°F | Wt 151.5 lb

## 2017-07-03 DIAGNOSIS — Z79899 Other long term (current) drug therapy: Secondary | ICD-10-CM

## 2017-07-03 DIAGNOSIS — I10 Essential (primary) hypertension: Secondary | ICD-10-CM

## 2017-07-03 DIAGNOSIS — Z87891 Personal history of nicotine dependence: Secondary | ICD-10-CM

## 2017-07-03 MED ORDER — AMLODIPINE BESYLATE 5 MG PO TABS: 5.0000 mg | ORAL_TABLET | Freq: Every day | ORAL | 1 refills | Status: DC

## 2017-07-03 NOTE — Progress Notes (Signed)
Internal Medicine Clinic Attending  Case discussed with Dr. Guilloud at the time of the visit.  We reviewed the resident's history and exam and pertinent patient test results.  I agree with the assessment, diagnosis, and plan of care documented in the resident's note.  

## 2017-07-03 NOTE — Assessment & Plan Note (Addendum)
Patient is here for BP follow up. Previously on HCTZ, unable to tolerate due to recurrent orthostatic hypotension. Started on lisinopril 20 mg at her last office visit on month ago. Unfortunately BP is persistently elevated today, 153/85 and 157/94 on recheck. Will add a second agent today. Avoid diuretics given prior orthostasis. Goal BP < 130/80.  -- Continue lisinopril 20 mg daily -- Add amlodipine 5 mg daily -- F/u 1-2 months -- BMP today   ADDENDUM: BMP with stable renal function. Called patient with results.

## 2017-07-03 NOTE — Progress Notes (Signed)
   CC: BP follow up  HPI:  [name] is a 66 y.o. female with past medical history outlined below here for BP follow up. For the details of today's visit, please refer to the assessment and plan.  Past Medical History:  Diagnosis Date  . Hyperlipidemia   . Hypertension   . Menopause syndrome     Review of Systems  Respiratory: Negative for cough.   Cardiovascular: Negative for chest pain.    Physical Exam:  Vitals:   07/03/17 1406 07/03/17 1408  BP: (!) 153/85 (!) 157/94  Pulse: 91 87  Temp: 98.2 F (36.8 C)   TempSrc: Oral   SpO2: 100%   Weight: 151 lb 8 oz (68.7 kg)     Constitutional: NAD, appears comfortable Cardiovascular: RRR, no murmurs, rubs, or gallops.  Pulmonary/Chest: CTAB, no wheezes, rales, or rhonchi. Extremities: Warm and well perfused. Distal pulses intact. No edema.  Psychiatric: Normal mood and affect  Assessment & Plan:   See Encounters Tab for problem based charting.  Patient discussed with Dr. Angelia Mould

## 2017-07-03 NOTE — Patient Instructions (Signed)
Ms. Katherine Haynes,  It was a pleasure to see you today. Please continue to take your lisinopril as previously prescribed. I have added a new medication called amlodipine. Please take this once a day as well.   I will call you with the results of your blood work today. Follow up with me again in 2 months.  If you have any questions or concerns, call our clinic at [phone] or after hours call [phone] and ask for the internal medicine resident on call. Thank you!  - Dr. Philipp Ovens

## 2017-07-04 LAB — BMP8+ANION GAP
ANION GAP: 12 mmol/L (ref 10.0–18.0)
BUN / CREAT RATIO: 14 (ref 12–28)
BUN: 15 mg/dL (ref 8–27)
CHLORIDE: 105 mmol/L (ref 96–106)
CO2: 24 mmol/L (ref 20–29)
Calcium: 9.2 mg/dL (ref 8.7–10.3)
Creatinine, Ser: 1.04 mg/dL — ABNORMAL HIGH (ref 0.57–1.00)
GFR calc Af Amer: 66 mL/min/{1.73_m2} (ref >59)
GFR calc non Af Amer: 57 mL/min/{1.73_m2} — ABNORMAL LOW (ref >59)
GLUCOSE: 92 mg/dL (ref 65–99)
Potassium: 4 mmol/L (ref 3.5–5.2)
SODIUM: 141 mmol/L (ref 134–144)

## 2017-07-31 ENCOUNTER — Other Ambulatory Visit: Payer: Self-pay | Admitting: Obstetrics and Gynecology

## 2017-07-31 DIAGNOSIS — Z1231 Encounter for screening mammogram for malignant neoplasm of breast: Secondary | ICD-10-CM

## 2017-08-10 ENCOUNTER — Other Ambulatory Visit: Payer: Self-pay | Admitting: *Deleted

## 2017-08-10 DIAGNOSIS — I10 Essential (primary) hypertension: Secondary | ICD-10-CM

## 2017-08-10 MED ORDER — LISINOPRIL 20 MG PO TABS: 20.0000 mg | ORAL_TABLET | Freq: Every day | ORAL | 0 refills | Status: DC

## 2017-08-14 ENCOUNTER — Other Ambulatory Visit: Payer: Self-pay | Admitting: Internal Medicine

## 2017-08-14 DIAGNOSIS — I10 Essential (primary) hypertension: Secondary | ICD-10-CM

## 2017-08-14 MED ORDER — AMLODIPINE BESYLATE 5 MG PO TABS
5.0000 mg | ORAL_TABLET | Freq: Every day | ORAL | 2 refills | Status: DC
Start: 2017-08-14 — End: 2017-09-11

## 2017-08-14 NOTE — Telephone Encounter (Signed)
Pt informed Lisinopril was refilled on 1/24 and needs to call [facility]. Next appt scheduled  2/25 with PCP.

## 2017-08-14 NOTE — Telephone Encounter (Signed)
Patient is requesting refills

## 2017-08-24 ENCOUNTER — Ambulatory Visit
Admission: RE | Admit: 2017-08-24 | Discharge: 2017-08-24 | Disposition: A | Discharge status: Home / Self Care | Payer: No Typology Code available for payment source | Source: Ambulatory Visit | Attending: Obstetrics and Gynecology | Admitting: Obstetrics and Gynecology

## 2017-08-24 ENCOUNTER — Ambulatory Visit (HOSPITAL_COMMUNITY)
Admission: RE | Admit: 2017-08-24 | Discharge: 2017-08-24 | Disposition: A | Discharge status: Home / Self Care | Payer: Self-pay | Source: Ambulatory Visit | Attending: Obstetrics and Gynecology | Admitting: Obstetrics and Gynecology

## 2017-08-24 ENCOUNTER — Encounter (HOSPITAL_COMMUNITY): Payer: Self-pay

## 2017-08-24 VITALS — BP 126/80 | Ht 66.0 in

## 2017-08-24 DIAGNOSIS — Z01419 Encounter for gynecological examination (general) (routine) without abnormal findings: Secondary | ICD-10-CM

## 2017-08-24 DIAGNOSIS — Z1231 Encounter for screening mammogram for malignant neoplasm of breast: Secondary | ICD-10-CM

## 2017-08-24 IMAGING — MG DIGITAL SCREENING BILATERAL MAMMOGRAM WITH TOMO AND CAD
9 of 12 series · 9 of 28 positions shown · non-contrast
Comparison: Previous exam(s).

CLINICAL DATA: Screening.

EXAM:
DIGITAL SCREENING BILATERAL MAMMOGRAM WITH TOMO AND CAD

[L CC synth-2D]
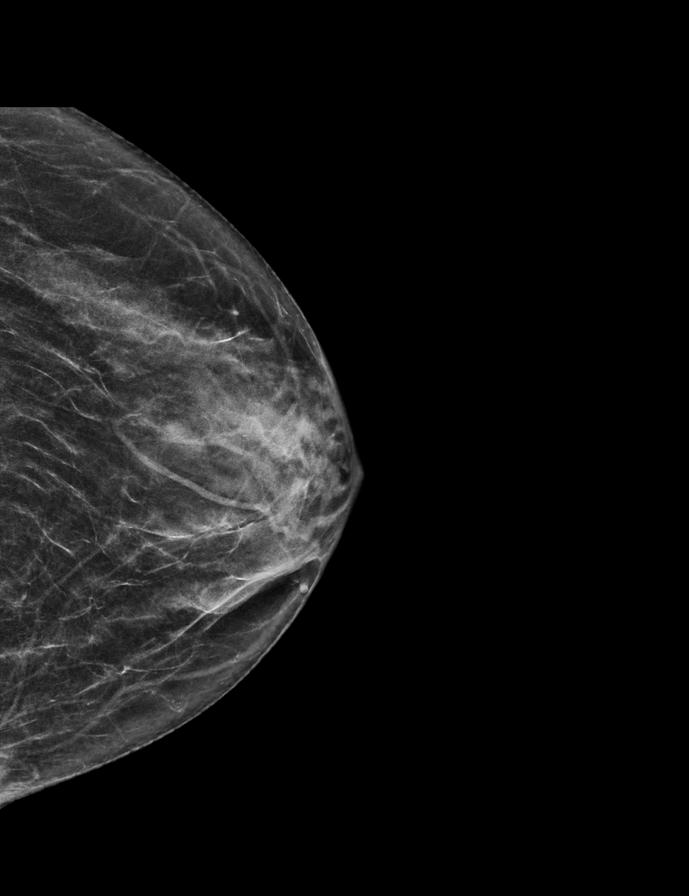

[R CC]
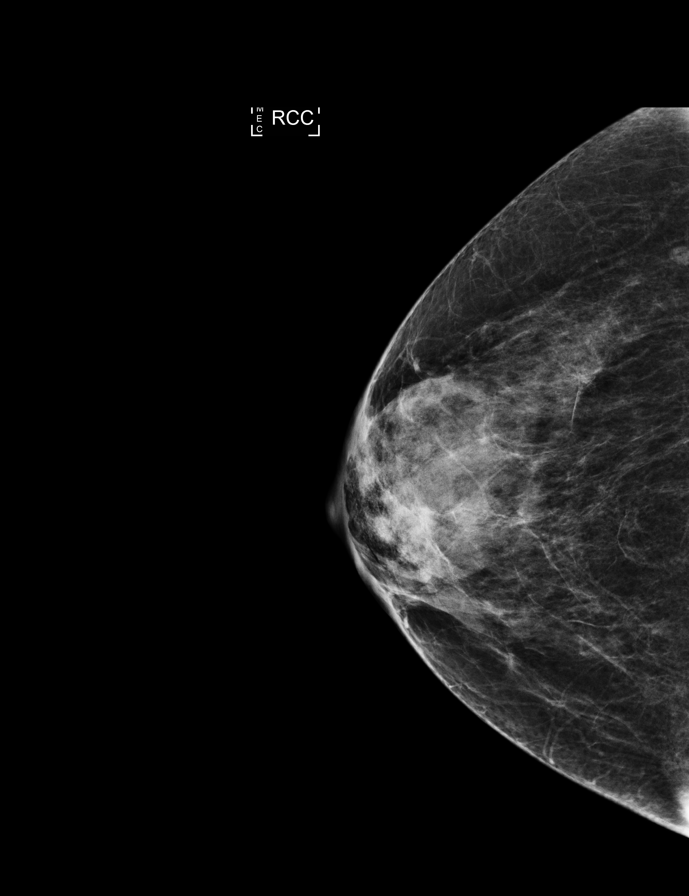

[R CC synth-2D]
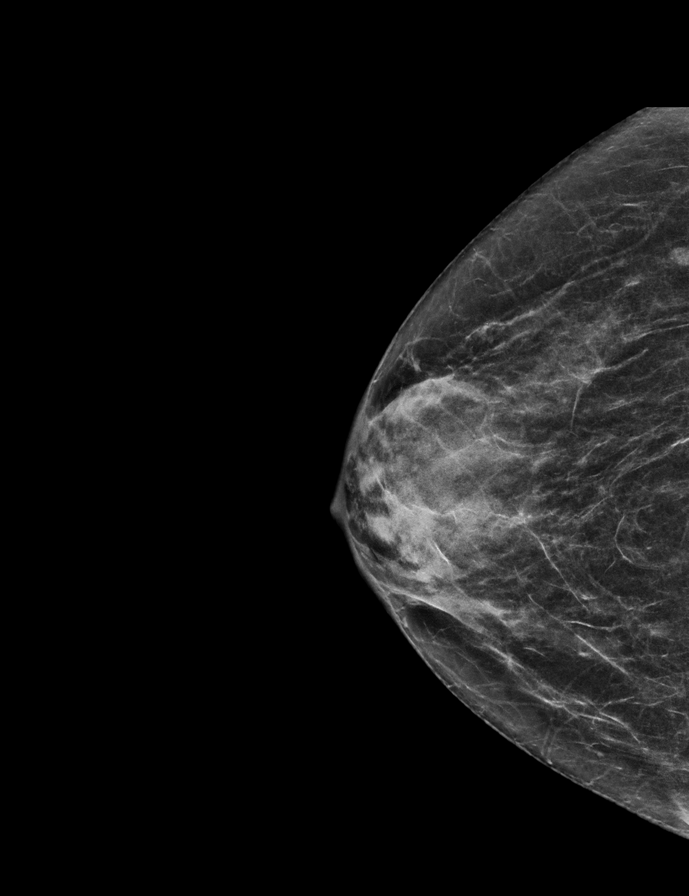

[L MLO synth-2D]
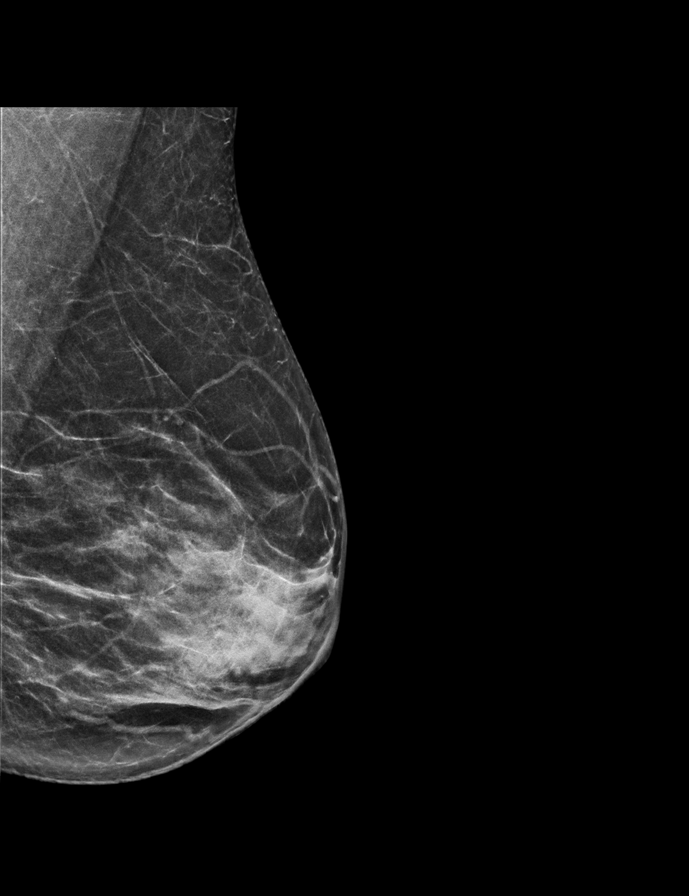

[L CC]
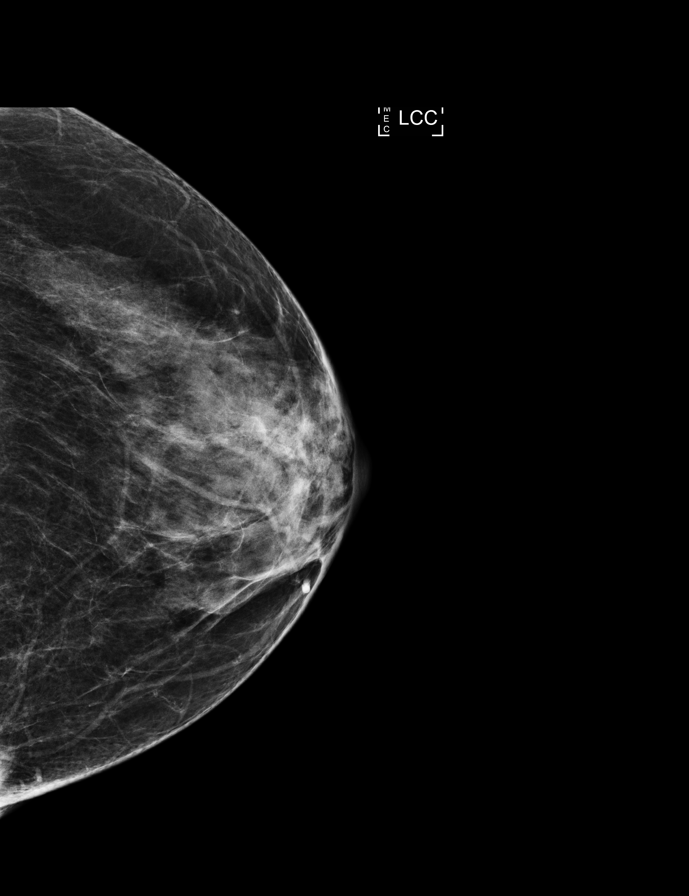

[L MLO]
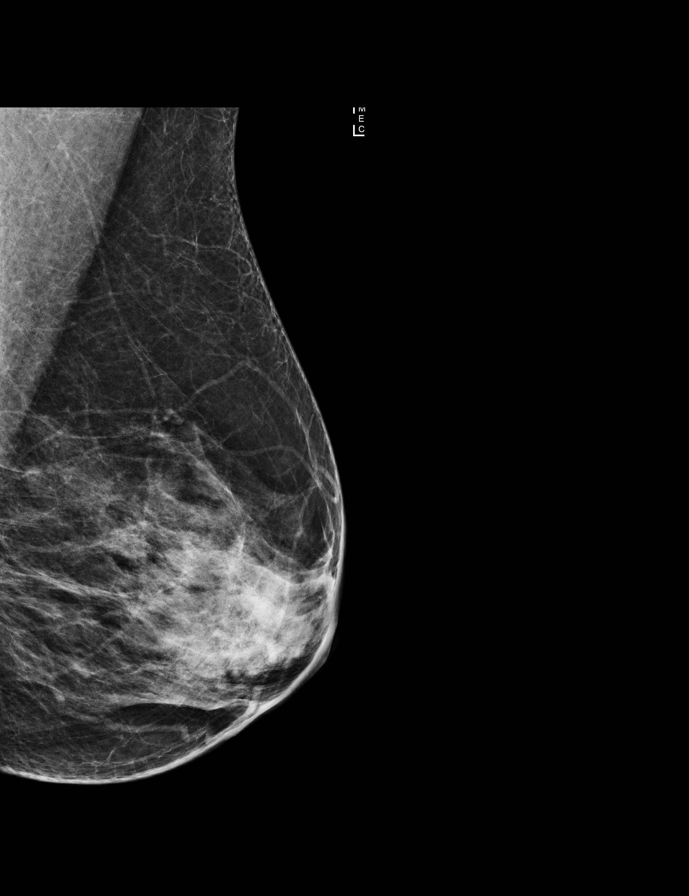

[R MLO synth-2D]
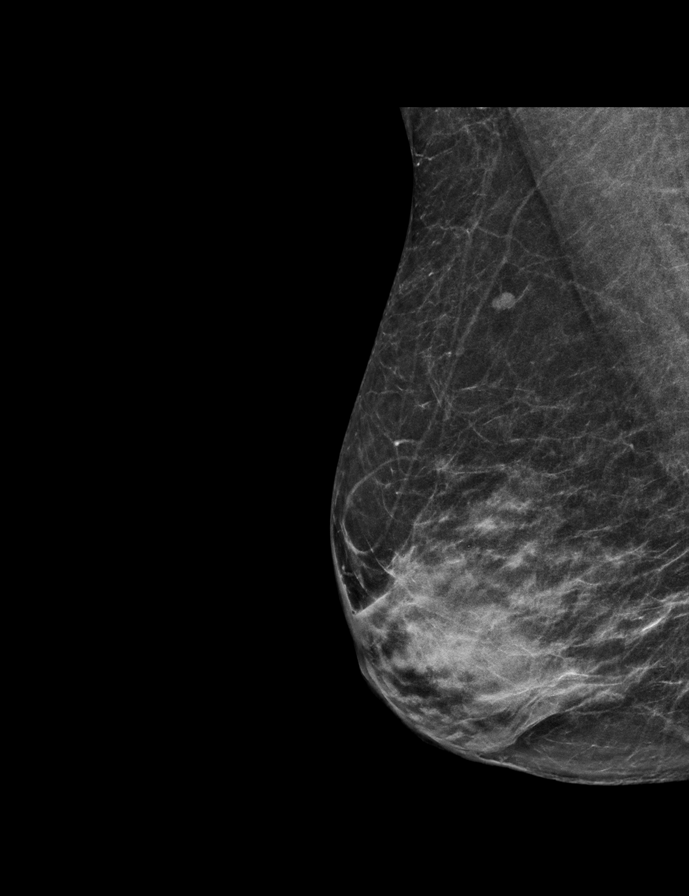

[R MLO]
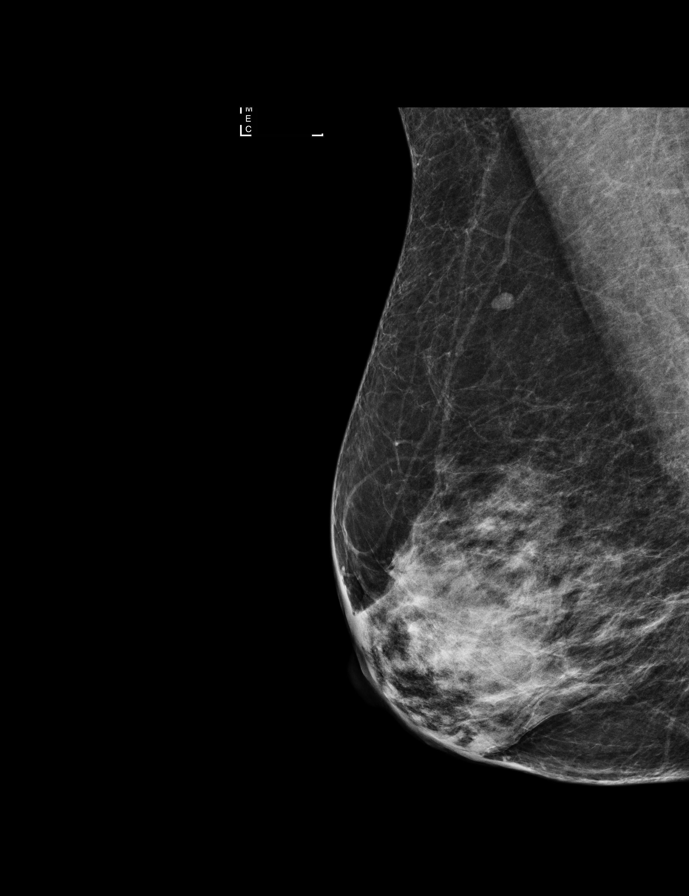

[R CC tomo · tomo slice 27/52.0]
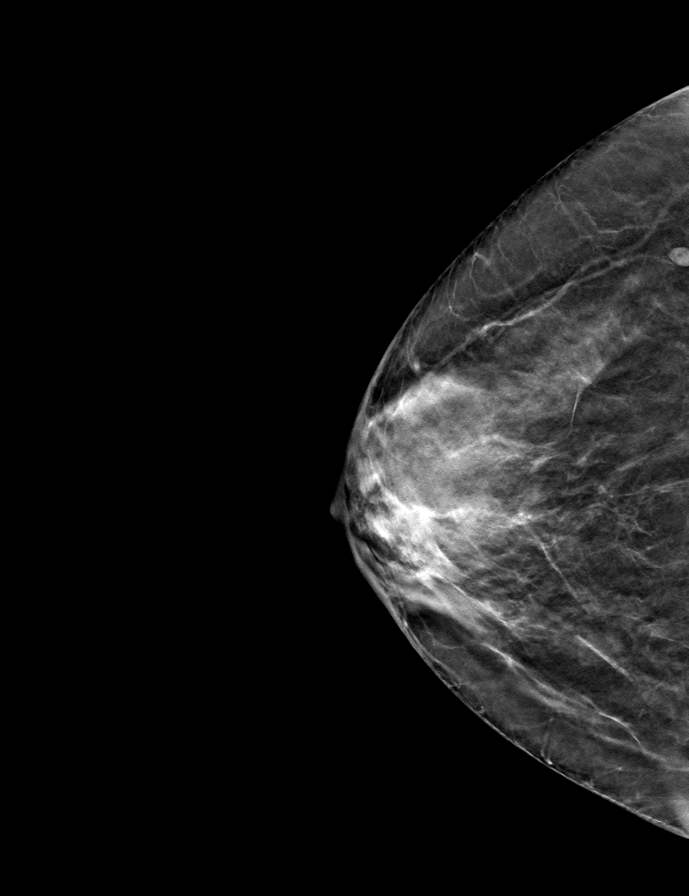

[9 of 28 positions shown; findings below may reference images not displayed]

ACR Breast Density Category c: The breast tissue is heterogeneously
dense, which may obscure small masses.
FINDINGS: There are no findings suspicious for malignancy. Images were
processed with CAD.
IMPRESSION: No mammographic evidence of malignancy. A result letter of this
screening mammogram will be mailed directly to the patient.

RECOMMENDATION:
Screening mammogram in one year. (Code:[5V])

BI-RADS CATEGORY  1: Negative.

## 2017-08-24 NOTE — Progress Notes (Signed)
No complaints today.   Pap Smear: Pap smear completed today. Last Pap smear was in 2012 and normal per patient. Per patient has no history of an abnormal Pap smear. No Pap smear results are in Epic.  Physical exam: Breasts Breasts symmetrical. No skin abnormalities bilateral breasts. No nipple retraction bilateral breasts. No nipple discharge bilateral breasts. No lymphadenopathy. No lumps palpated bilateral breasts. No complaints of pain or tenderness on exam. Referred patient to the [facility] for a screening mammogram. Appointment scheduled for Thursday, August 24, 2017 at 1110.  Pelvic/Bimanual   Ext Genitalia No lesions, no swelling and no discharge observed on external genitalia.         Vagina Vagina pink and normal texture. No lesions or discharge observed in vagina.          Cervix Cervix is present. Cervix pink and of normal texture. No discharge observed.     Uterus Uterus is present and palpable. Uterus in normal position and normal size.        Adnexae Bilateral ovaries present and palpable. No tenderness on palpation.          Rectovaginal No rectal exam completed today since patient had no rectal complaints. No skin abnormalities observed on exam.    Smoking History: Patient is a former smoker that quit over 40 years ago.  Patient Navigation: Patient education provided. Access to services provided for patient through [facility].   Colorectal Cancer Screening: Per patient had a colonoscopy completed 10 years ago. No complaints today. Patient was given a FIT Test to complete 06/12/2017. No results are in Epic.

## 2017-08-24 NOTE — Patient Instructions (Addendum)
Explained breast self awareness with Ansleigh Dowdy. Let patient know [facility] will cover Pap smears and HPV typing every 5 years unless has a history of abnormal Pap smears. Referred patient to the [facility] for a screening mammogram. Appointment scheduled for Thursday, August 24, 2017 at 1110. Let patient know will follow-up with her within the next couple of weeks with results of Pap smear by letter or phone. Informed patient [facility] will [facility] up with her within the next couple weeks with results of mammogram by letter or phone. Shalee Dowdy verbalized understanding.  Summar Mcglothlin, Arvil Chaco, RN 1:21 PM

## 2017-08-24 NOTE — Addendum Note (Signed)
Encounter addended by: Loletta Parish, RN on: 08/24/2017 1:37 PM  Actions taken: Sign clinical note

## 2017-08-25 ENCOUNTER — Encounter (HOSPITAL_COMMUNITY): Payer: Self-pay | Admitting: *Deleted

## 2017-08-25 ENCOUNTER — Telehealth (HOSPITAL_COMMUNITY): Payer: Self-pay | Admitting: *Deleted

## 2017-08-25 ENCOUNTER — Other Ambulatory Visit (HOSPITAL_COMMUNITY): Payer: Self-pay | Admitting: *Deleted

## 2017-08-25 LAB — CYTOLOGY - PAP
DIAGNOSIS: NEGATIVE
HPV: NOT DETECTED

## 2017-08-25 MED ORDER — METRONIDAZOLE 500 MG PO TABS: 500.0000 mg | ORAL_TABLET | Freq: Two times a day (BID) | ORAL | 0 refills | Status: DC

## 2017-08-25 NOTE — Telephone Encounter (Signed)
Telephoned patient at home number and advised patient of negative pap smear results. HPV was negative. Next pap smear due in five years. Advised patient pap smear did show trichomonas. Medication called into [facility]. Advised patient to finish all medication, no alcohol while taking medication, no sexual contact until finishing medication, and partner would also need to be treated. Patient voiced understanding.

## 2017-09-11 ENCOUNTER — Ambulatory Visit (INDEPENDENT_AMBULATORY_CARE_PROVIDER_SITE_OTHER): Payer: Self-pay | Admitting: Internal Medicine

## 2017-09-11 ENCOUNTER — Encounter: Payer: Self-pay | Admitting: Internal Medicine

## 2017-09-11 VITALS — BP 133/73 | HR 93 | Temp 98.1°F | Wt 151.0 lb

## 2017-09-11 DIAGNOSIS — G4700 Insomnia, unspecified: Secondary | ICD-10-CM | POA: Insufficient documentation

## 2017-09-11 DIAGNOSIS — G8929 Other chronic pain: Secondary | ICD-10-CM

## 2017-09-11 DIAGNOSIS — G8921 Chronic pain due to trauma: Secondary | ICD-10-CM

## 2017-09-11 DIAGNOSIS — F5101 Primary insomnia: Secondary | ICD-10-CM

## 2017-09-11 DIAGNOSIS — Z791 Long term (current) use of non-steroidal anti-inflammatories (NSAID): Secondary | ICD-10-CM

## 2017-09-11 DIAGNOSIS — Z0000 Encounter for general adult medical examination without abnormal findings: Secondary | ICD-10-CM

## 2017-09-11 DIAGNOSIS — I10 Essential (primary) hypertension: Secondary | ICD-10-CM

## 2017-09-11 DIAGNOSIS — M25561 Pain in right knee: Secondary | ICD-10-CM

## 2017-09-11 DIAGNOSIS — Z79899 Other long term (current) drug therapy: Secondary | ICD-10-CM

## 2017-09-11 MED ORDER — TRAZODONE HCL 50 MG PO TABS: 25.0000 mg | ORAL_TABLET | Freq: Every evening | ORAL | 0 refills | Status: DC | PRN

## 2017-09-11 MED ORDER — LISINOPRIL 20 MG PO TABS
20.0000 mg | ORAL_TABLET | Freq: Every day | ORAL | 2 refills | Status: DC
Start: 2017-09-11 — End: 2017-11-13

## 2017-09-11 MED ORDER — AMLODIPINE BESYLATE 10 MG PO TABS: 10.0000 mg | ORAL_TABLET | Freq: Every day | ORAL | 2 refills | Status: DC

## 2017-09-11 MED ORDER — NAPROXEN 500 MG PO TABS: 500.0000 mg | ORAL_TABLET | Freq: Two times a day (BID) | ORAL | 1 refills | Status: DC

## 2017-09-11 NOTE — Progress Notes (Signed)
   CC: BP follow up  HPI:  [name] is a 67 y.o. female with past medical history outlined below here for BP follow up. For the details of today's visit, please refer to the assessment and plan.  Past Medical History:  Diagnosis Date  . Hyperlipidemia   . Hypertension   . Menopause syndrome     Review of Systems  Cardiovascular: Negative for chest pain and palpitations.  Neurological: Negative for dizziness.  Psychiatric/Behavioral: The patient has insomnia.     Physical Exam:  Vitals:   09/11/17 1341 09/11/17 1350  BP: (!) 147/70 133/73  Pulse: 90 93  Temp: 98.1 F (36.7 C)   TempSrc: Oral   SpO2: 100%   Weight: 151 lb (68.5 kg)     Constitutional: NAD, appears comfortable Cardiovascular: RRR, no murmurs, rubs, or gallops.  Pulmonary/Chest: CTAB, no wheezes, rales, or rhonchi. Extremities: Warm and well perfused. Distal pulses intact. No edema.  Psychiatric: Normal mood and affect   Assessment & Plan:   See Encounters Tab for problem based charting.  Patient discussed with Dr. Dareen Piano

## 2017-09-11 NOTE — Assessment & Plan Note (Signed)
Blood pressure today was mildly elevated. 147/70 on initial check, improved to 133/73 on repeat. She is currently prescribed lisinopril 20 mg daily and amlodipine 5 mg daily.  -- Continue lisinopril 20 mg daily -- Increase amlodipine 10 mg daily  -- F/u 2 months

## 2017-09-11 NOTE — Assessment & Plan Note (Signed)
Patient is endorsing chronic right knee pain after injury 2 years ago. CT at that time demonstrated a "punch type fracture involving the posterior aspect of the lateral tibial plateau". She reports this was treated with bracing and physical therapy. She was told she may need surgery in the future, but she adamantly wishes to avoid this. Her pain involves the musculature surrounding her knee worse posteriorly, and radiates up her leg. The knee joint itself does not appear to be painful. Range of motion and strength intact. She denies weakness or difficulty walking. She still has a knee brace that she uses occasionally as needed. Advised prescription strength naproxen 500 mg twice a day as needed when pain is severe. Advised her not to take this daily if possible, and not more than a week at a time. Otherwise Tylenol for pain. We also discussed if her pain continues, we may may need imaging to evaluate her knee joint, and possible referral to ortho or sports medicine for further management. -- Naproxen 500 mg twice a day when necessary -- Tylenol as needed -- Consider x-ray if pain continues, and possible referral to sports medicine or orthopedic surgery

## 2017-09-11 NOTE — Assessment & Plan Note (Signed)
Patient was given stool cards at her last appointment which she mailed in. Unfortunately our clinic did not receive them. She is agreeable to repeating stool cards today.

## 2017-09-11 NOTE — Assessment & Plan Note (Signed)
Patient complaining of difficulty sleeping over the past few months. She reports difficulty with both falling asleep and staying asleep. This occurs 3-4 times a week. She denies symptoms of anxiety and depression. Her sleep hygiene is good, we talked extensively about this today. She drinks caffeine only in the mornings after waking. She does not drink alcohol. She goes to bed at the same time every night and does not take naps throughout the day. She does not have excessive screen time prior to bed. We discussed treatment with low-dose trazodone as needed. If this is too sedating, advised patient to try over-the-counter melatonin 3 mg. -- Trazodone 25-50 mg QHS prn or melatonin as needed -- F/u 2 months

## 2017-09-11 NOTE — Patient Instructions (Addendum)
Ms. Katherine Haynes,  It was a pleasure to see you today. I am glad you are doing well! You blood pressure is improved from you last visit, but still slightly elevated. I have increased the dose of your amlodipine from 5 mg to 10 mg. You may take 2 tablets of your current prescription until you run out and then start your new prescription. Please continue to take your lisinopril as previously prescribed.  For sleep, I have given you a prescription for trazodone. You may take 1/2 to 1 tablet at night as needed for sleep. If this medicine is too sedating for you, you may consider trying over the counter melatonin. The recommended dose is 3 mg.   For you knee pain, I have given you a prescription for naproxen 500 mg twice daily as needed. Please only take this when you pain is severe and do not take for more than a week at a time. If you continue to have knee pain, we may want to consider getting an xray to look at your knee joint.   Please follow up with me again in 2 months or sooner if there are any issues. If you have any questions or concerns, call our clinic at [phone] or after hours call [phone] and ask for the internal medicine resident on call. Thank you!  - Dr. Philipp Ovens

## 2017-09-13 ENCOUNTER — Encounter: Payer: Self-pay | Admitting: Internal Medicine

## 2017-09-13 NOTE — Progress Notes (Signed)
Internal Medicine Clinic Attending  Case discussed with Dr. Guilloud at the time of the visit.  We reviewed the resident's history and exam and pertinent patient test results.  I agree with the assessment, diagnosis, and plan of care documented in the resident's note.  

## 2017-11-13 ENCOUNTER — Other Ambulatory Visit: Payer: Self-pay

## 2017-11-13 ENCOUNTER — Encounter (INDEPENDENT_AMBULATORY_CARE_PROVIDER_SITE_OTHER): Payer: Self-pay

## 2017-11-13 ENCOUNTER — Telehealth: Payer: Self-pay | Admitting: *Deleted

## 2017-11-13 ENCOUNTER — Ambulatory Visit: Payer: Self-pay | Admitting: Internal Medicine

## 2017-11-13 ENCOUNTER — Encounter: Payer: Self-pay | Admitting: Internal Medicine

## 2017-11-13 VITALS — BP 144/66 | HR 77 | Temp 97.9°F | Ht 66.0 in | Wt 159.0 lb

## 2017-11-13 DIAGNOSIS — Z791 Long term (current) use of non-steroidal anti-inflammatories (NSAID): Secondary | ICD-10-CM

## 2017-11-13 DIAGNOSIS — Z0000 Encounter for general adult medical examination without abnormal findings: Secondary | ICD-10-CM

## 2017-11-13 DIAGNOSIS — Z79899 Other long term (current) drug therapy: Secondary | ICD-10-CM

## 2017-11-13 DIAGNOSIS — I10 Essential (primary) hypertension: Secondary | ICD-10-CM

## 2017-11-13 DIAGNOSIS — M5430 Sciatica, unspecified side: Secondary | ICD-10-CM | POA: Insufficient documentation

## 2017-11-13 DIAGNOSIS — M5441 Lumbago with sciatica, right side: Secondary | ICD-10-CM

## 2017-11-13 DIAGNOSIS — M5431 Sciatica, right side: Secondary | ICD-10-CM

## 2017-11-13 MED ORDER — LISINOPRIL-HYDROCHLOROTHIAZIDE 20-12.5 MG PO TABS: 1.0000 | ORAL_TABLET | Freq: Every day | ORAL | 2 refills | Status: DC

## 2017-11-13 MED ORDER — LISINOPRIL 40 MG PO TABS: 40.0000 mg | ORAL_TABLET | Freq: Every day | ORAL | 2 refills | Status: DC

## 2017-11-13 MED ORDER — NAPROXEN 500 MG PO TABS: 500.0000 mg | ORAL_TABLET | Freq: Two times a day (BID) | ORAL | 0 refills | Status: DC

## 2017-11-13 NOTE — Telephone Encounter (Signed)
Call to [facility] spoke to Pharmacist.  Need to discontinue Lisinopril-HCTZ and to start Lisinopril 40 mg tablets.  Pharmacy to hold Lisinopril-HCTZ prescription per order of Dr. Philipp Ovens.  Sander Nephew, RN 11/13/2017 3:30 PM.

## 2017-11-13 NOTE — Progress Notes (Signed)
   CC: BP follow up  HPI:  [name] is a 68 y.o. female with past medical history outlined below here for blood pressure follow up. For the details of today's visit, please refer to the assessment and plan.  Past Medical History:  Diagnosis Date  . Hyperlipidemia   . Hypertension   . Menopause syndrome     Review of Systems  Respiratory: Negative for shortness of breath.   Cardiovascular: Negative for chest pain.  Musculoskeletal: Positive for back pain.    Physical Exam:  Vitals:   11/13/17 1401  BP: (!) 149/64  Pulse: 77  Temp: 97.9 F (36.6 C)  TempSrc: Oral  SpO2: 100%  Weight: 159 lb (72.1 kg)  Height: 5\' 6"  (1.676 m)    Constitutional: NAD, appears comfortable Cardiovascular: RRR, no murmurs, rubs, or gallops.  Pulmonary/Chest: CTAB, no wheezes, rales, or rhonchi.  MSK: Pain with palpation over right gluteal muscles, + right straight leg raise Psychiatric: Normal mood and affect  Assessment & Plan:   See Encounters Tab for problem based charting.  Patient discussed with Dr. Angelia Mould

## 2017-11-13 NOTE — Patient Instructions (Addendum)
FOLLOW-UP INSTRUCTIONS When: 3 months For: PCP follow up What to bring: Medications   Katherine Haynes,  It was a pleasure to see you again. I am sorry to hear about your hip pain. Please take naproxen twice daily with meals for the next 1-2 weeks. I have provided you with some stretching exercises that should help with the pain.  For your blood pressure, I Increased your lisinopril to 40 mg daily. You may take two tablets of your current prescription until you run out then start your new prescription. Follow up with me again in 3 months.   Please bring in your stool cards when convenient.   If you have any questions or concerns, call our clinic at [phone] or after hours call [phone] and ask for the internal medicine resident on call. Thank you!  - Dr. Philipp Ovens    Sciatica Rehab Ask your health care provider which exercises are safe for you. Do exercises exactly as told by your health care provider and adjust them as directed. It is normal to feel mild stretching, pulling, tightness, or discomfort as you do these exercises, but you should stop right away if you feel sudden pain or your pain gets worse.Do not begin these exercises until told by your health care provider. Stretching and range of motion exercises These exercises warm up your muscles and joints and improve the movement and flexibility of your hips and your back. These exercises also help to relieve pain, numbness, and tingling. Exercise A: Sciatic nerve glide 1. Sit in a chair with your head facing down toward your chest. Place your hands behind your back. Let your shoulders slump forward. 2. Slowly straighten one of your knees while you tilt your head back as if you are looking toward the ceiling. Only straighten your leg as far as you can without making your symptoms worse. 3. Hold for __________ seconds. 4. Slowly return to the starting position. 5. Repeat with your other leg. Repeat __________ times. Complete this  exercise __________ times a day. Exercise B: Knee to chest with hip adduction and internal rotation  1. Lie on your back on a firm surface with both legs straight. 2. Bend one of your knees and move it up toward your chest until you feel a gentle stretch in your lower back and buttock. Then, move your knee toward the shoulder that is on the opposite side from your leg. ? Hold your leg in this position by holding onto the front of your knee. 3. Hold for __________ seconds. 4. Slowly return to the starting position. 5. Repeat with your other leg. Repeat __________ times. Complete this exercise __________ times a day. Exercise C: Prone extension on elbows  1. Lie on your abdomen on a firm surface. A bed may be too soft for this exercise. 2. Prop yourself up on your elbows. 3. Use your arms to help lift your chest up until you feel a gentle stretch in your abdomen and your lower back. ? This will place some of your body weight on your elbows. If this is uncomfortable, try stacking pillows under your chest. ? Your hips should stay down, against the surface that you are lying on. Keep your hip and back muscles relaxed. 4. Hold for __________ seconds. 5. Slowly relax your upper body and return to the starting position. Repeat __________ times. Complete this exercise __________ times a day. Strengthening exercises These exercises build strength and endurance in your back. Endurance is the ability to use your muscles  for a long time, even after they get tired. Exercise D: Pelvic tilt 1. Lie on your back on a firm surface. Bend your knees and keep your feet flat. 2. Tense your abdominal muscles. Tip your pelvis up toward the ceiling and flatten your lower back into the floor. ? To help with this exercise, you may place a small towel under your lower back and try to push your back into the towel. 3. Hold for __________ seconds. 4. Let your muscles relax completely before you repeat this  exercise. Repeat __________ times. Complete this exercise __________ times a day. Exercise E: Alternating arm and leg raises  1. Get on your hands and knees on a firm surface. If you are on a hard floor, you may want to use padding to cushion your knees, such as an exercise mat. 2. Line up your arms and legs. Your hands should be below your shoulders, and your knees should be below your hips. 3. Lift your left leg behind you. At the same time, raise your right arm and straighten it in front of you. ? Do not lift your leg higher than your hip. ? Do not lift your arm higher than your shoulder. ? Keep your abdominal and back muscles tight. ? Keep your hips facing the ground. ? Do not arch your back. ? Keep your balance carefully, and do not hold your breath. 4. Hold for __________ seconds. 5. Slowly return to the starting position and repeat with your right leg and your left arm. Repeat __________ times. Complete this exercise __________ times a day. Posture and body mechanics  Body mechanics refers to the movements and positions of your body while you do your daily activities. Posture is part of body mechanics. Good posture and healthy body mechanics can help to relieve stress in your body's tissues and joints. Good posture means that your spine is in its natural S-curve position (your spine is neutral), your shoulders are pulled back slightly, and your head is not tipped forward. The following are general guidelines for applying improved posture and body mechanics to your everyday activities. Standing   When standing, keep your spine neutral and your feet about hip-width apart. Keep a slight bend in your knees. Your ears, shoulders, and hips should line up.  When you do a task in which you stand in one place for a long time, place one foot up on a stable object that is 2-4 inches (5-10 cm) high, such as a footstool. This helps keep your spine neutral. Sitting   When sitting, keep your  spine neutral and keep your feet flat on the floor. Use a footrest, if necessary, and keep your thighs parallel to the floor. Avoid rounding your shoulders, and avoid tilting your head forward.  When working at a desk or a computer, keep your desk at a height where your hands are slightly lower than your elbows. Slide your chair under your desk so you are close enough to maintain good posture.  When working at a computer, place your monitor at a height where you are looking straight ahead and you do not have to tilt your head forward or downward to look at the screen. Resting   When lying down and resting, avoid positions that are most painful for you.  If you have pain with activities such as sitting, bending, stooping, or squatting (flexion-based activities), lie in a position in which your body does not bend very much. For example, avoid curling up on your  side with your arms and knees near your chest (fetal position).  If you have pain with activities such as standing for a long time or reaching with your arms (extension-based activities), lie with your spine in a neutral position and bend your knees slightly. Try the following positions: ? Lying on your side with a pillow between your knees. ? Lying on your back with a pillow under your knees. Lifting   When lifting objects, keep your feet at least shoulder-width apart and tighten your abdominal muscles.  Bend your knees and hips and keep your spine neutral. It is important to lift using the strength of your legs, not your back. Do not lock your knees straight out.  Always ask for help to lift heavy or awkward objects. This information is not intended to replace advice given to you by your health care provider. Make sure you discuss any questions you have with your health care provider. Document Released: 07/04/2005 Document Revised: 03/10/2016 Document Reviewed: 03/20/2015 Elsevier Interactive Patient Education  [employer].

## 2017-11-13 NOTE — Assessment & Plan Note (Signed)
Patient was given stool cards at her last appointment.  She has not yet done this, still has stool cards at home.  Encouraged patient to complete testing and bring to the appointment when convenient.

## 2017-11-13 NOTE — Assessment & Plan Note (Signed)
Patient is here for blood pressure follow-up.  She is uncontrolled today on current regimen of lisinopril 20 mg and amlodipine 10 mg. BP today is 149/64.  Patient is unable to tolerate thiazide diuretics.  Previously prescribed hydrochlorthiazide with recurrent symptomatic orthostatic hypotension that resolved with discontinuation of the medication.  --Increase lisinopril 40 mg daily --Continue amlodipine 10 mg daily --Follow-up 3 months --Repeat BMP at follow-up

## 2017-11-13 NOTE — Assessment & Plan Note (Signed)
Patient is complaining of pain consistent with sciatica.  Straight leg raise is positive.  We will treat with prescription strength NSAIDs and stretching exercises.  Instructed patient to follow-up if pain does not improve. --Naproxen 500 mg twice daily with meals --Stretching exercises provided --Follow-up as needed

## 2017-11-14 NOTE — Progress Notes (Signed)
Internal Medicine Clinic Attending  Case discussed with Dr. Guilloud at the time of the visit.  We reviewed the resident's history and exam and pertinent patient test results.  I agree with the assessment, diagnosis, and plan of care documented in the resident's note.  

## 2017-11-15 ENCOUNTER — Other Ambulatory Visit: Payer: Self-pay

## 2017-11-15 NOTE — Telephone Encounter (Signed)
Spoke with [facility] who states they did not receive Rx for naproxen even though we received "Receipt confirmed by Pharmacy" on 11/13/2017 at 2:21 PM. Pharmacist unavailable to give verbal auth. Tech transferred call to VM. Detailed VM left with Rx info. Patient notified. Hubbard Hartshorn, RN, BSN

## 2017-11-15 NOTE — Telephone Encounter (Signed)
Pt states naproxen (NAPROSYN) 500 MG tablet is not at the pharmacy. Pt is using [facility] on [address].

## 2017-11-22 ENCOUNTER — Other Ambulatory Visit: Payer: Self-pay

## 2017-11-22 ENCOUNTER — Telehealth: Payer: Self-pay | Admitting: Internal Medicine

## 2017-11-22 DIAGNOSIS — Z0000 Encounter for general adult medical examination without abnormal findings: Secondary | ICD-10-CM

## 2017-11-22 NOTE — Telephone Encounter (Signed)
Spoke to pt, she has appt 5/13, suggested speaking to her pharmacist and see what he or she would recommend until appt, she is agreeable

## 2017-11-22 NOTE — Telephone Encounter (Signed)
Pt using OTC allergy medicines and still not helping.  Patient would like a call back about what else she can take until she is seen

## 2017-11-23 ENCOUNTER — Telehealth: Payer: Self-pay | Admitting: Internal Medicine

## 2017-11-23 LAB — FECAL OCCULT BLOOD, IMMUNOCHEMICAL: Fecal Occult Bld: NEGATIVE

## 2017-11-27 ENCOUNTER — Ambulatory Visit: Payer: Self-pay

## 2017-12-08 ENCOUNTER — Encounter: Payer: Self-pay | Admitting: *Deleted

## 2017-12-14 ENCOUNTER — Other Ambulatory Visit: Payer: Self-pay

## 2017-12-14 ENCOUNTER — Ambulatory Visit (INDEPENDENT_AMBULATORY_CARE_PROVIDER_SITE_OTHER): Payer: Medicare HMO | Admitting: Internal Medicine

## 2017-12-14 VITALS — BP 147/74 | HR 76 | Temp 97.6°F | Ht 66.0 in | Wt 155.3 lb

## 2017-12-14 DIAGNOSIS — M25561 Pain in right knee: Secondary | ICD-10-CM

## 2017-12-14 MED ORDER — DICLOFENAC SODIUM 1 % TD GEL
2.0000 g | Freq: Four times a day (QID) | TRANSDERMAL | 1 refills | Status: DC | PRN
Start: 2017-12-14 — End: 2020-06-15

## 2017-12-14 NOTE — Progress Notes (Signed)
Internal Medicine Clinic Attending  Case discussed with Dr. Harbrecht at the time of the visit.  We reviewed the resident's history and exam and pertinent patient test results.  I agree with the assessment, diagnosis, and plan of care documented in the resident's note.   

## 2017-12-14 NOTE — Assessment & Plan Note (Signed)
  Acute right knee pain: Patient stated that the pain began abruptly while at work and was not associated with a traumatic event including absence of fall, and denying angular torsion as well prolonged sitting or standing.  The pain is improved with rest and slightly improved with Aleve but made worse with taking steps and prolonged activity.  The patient routinely uses her knees extensively throughout her day at work which as resulted in persistence but not worsening of the pain.  She denies edema of the knee, erythema of the knee, weakness of the toes, cyanosis of the distal lower extremity but did endorse pain induced secondary perceived weakness.  In addition, the patient denies fever, chills, nausea, vomiting, or other concerning symptoms today.  This pain is consistent with an acute tendinitis most likely secondary to overuse in the setting of conserving her right hip in an attempt to reduce the hip pain.  As a result, I believe this will heal with time and warrants only conservative measures.  Plan: Continue Aleve for hip and knee pain Voltaren gel 2 g daily as needed for knee pain Minimize use at work as tolerable to speed healing Medication for brace or compression bandage Heat or ice as indicated Return herself symptoms fail to resolve or worsen.

## 2017-12-14 NOTE — Patient Instructions (Addendum)
FOLLOW-UP INSTRUCTIONS When: if symptoms worsen or fail to improve  What to bring:  All of your medications  Thank you for your visit to the [facility] today.  With regard to your knee pain I would recommend continuing to use the Naproxen that you were adivsed to take for the hip pain. In addition we will prescribe a topical NSAID know as Voltaren gel to assist with the acute knee pain as well.  If you have any questions or concerns please feel free to contact us at any time.

## 2017-12-14 NOTE — Progress Notes (Signed)
   CC: acute right knee pain  HPI:Katherine Haynes is a 67 y.o. female who presents today for evaluation of acute right knee pain x4 days.  Acute right knee pain: Patient stated that the pain began abruptly while at work and was not associated with a traumatic event including absence of fall, and denying angular torsion as well prolonged sitting or standing.  The pain is improved with rest and slightly improved with Aleve but made worse with taking steps and prolonged activity.  The patient routinely uses her knees extensively throughout her day at work which as resulted in persistence but not worsening of the pain.  She denies edema of the knee, erythema of the knee, weakness of the toes, cyanosis of the distal lower extremity but did endorse pain induced secondary perceived weakness.  In addition, the patient denies fever, chills, nausea, vomiting, or other concerning symptoms today.  This pain is consistent with an acute tendinitis most likely secondary to overuse in the setting of conserving her right hip in an attempt to reduce the hip pain.  As a result, I believe this will heal with time and warrants only conservative measures.  Plan: Continue Aleve for hip and knee pain Voltaren gel 2 g daily as needed for knee pain Minimize use at work as tolerable to speed healing Medication for brace or compression bandage Heat or ice as indicated Return herself symptoms fail to resolve or worsen.  Past Medical History:  Diagnosis Date  . Hyperlipidemia   . Hypertension   . Menopause syndrome    Review of Systems:  ROS negative except as per HPI.  Physical Exam:  Vitals:   12/14/17 1009  BP: (!) 147/74  Pulse: 76  Temp: 97.6 F (36.4 C)  TempSrc: Oral  SpO2: 100%  Weight: 155 lb 4.8 oz (70.4 kg)  Height: 5\' 6"  (1.676 m)   Physical Exam  Constitutional: She is oriented to person, place, and time. She appears well-developed and well-nourished. No distress.  HENT:  Head:  Normocephalic and atraumatic.  Cardiovascular:  Pulses:      Dorsalis pedis pulses are 2+ on the right side, and 2+ on the left side.       Posterior tibial pulses are 2+ on the right side, and 2+ on the left side.  Musculoskeletal: She exhibits tenderness (To palpation of the anterior aspect of the right knee superioly to the patella and posterior right knee). She exhibits no edema or deformity.  Anterior and posterior drawer test were negative. No notable varus or valgus deformity noted. There was pain with external rotation of the right hip distinct from the right knee pain. NO cyanosis or peripheral sensation deficit of the right distal extremity.   Neurological: She is alert and oriented to person, place, and time.  Skin: Skin is warm. Capillary refill takes less than 2 seconds. She is not diaphoretic.  Psychiatric: She has a normal mood and affect.   Assessment & Plan:   See Encounters Tab for problem based charting.  Patient discussed with Dr. Dareen Piano.

## 2017-12-24 ENCOUNTER — Other Ambulatory Visit: Payer: Self-pay | Admitting: Internal Medicine

## 2017-12-24 DIAGNOSIS — I10 Essential (primary) hypertension: Secondary | ICD-10-CM

## 2017-12-25 NOTE — Telephone Encounter (Signed)
Next appt scheduled  8/5 with PCP. 

## 2018-01-25 ENCOUNTER — Other Ambulatory Visit: Payer: Self-pay | Admitting: *Deleted

## 2018-01-25 DIAGNOSIS — I10 Essential (primary) hypertension: Secondary | ICD-10-CM

## 2018-01-25 MED ORDER — LISINOPRIL 40 MG PO TABS: 40.0000 mg | ORAL_TABLET | Freq: Every day | ORAL | 1 refills | Status: DC

## 2018-01-25 NOTE — Telephone Encounter (Signed)
Next appt scheduled  8/5 with PCP. Requesting 90 day supply. Thanks

## 2018-02-12 ENCOUNTER — Encounter: Payer: Self-pay | Admitting: Internal Medicine

## 2018-02-19 ENCOUNTER — Encounter: Payer: Self-pay | Admitting: Internal Medicine

## 2018-03-12 ENCOUNTER — Other Ambulatory Visit: Payer: Self-pay | Admitting: Internal Medicine

## 2018-03-12 DIAGNOSIS — M25561 Pain in right knee: Principal | ICD-10-CM

## 2018-03-12 DIAGNOSIS — G8929 Other chronic pain: Secondary | ICD-10-CM

## 2018-03-16 ENCOUNTER — Other Ambulatory Visit: Payer: Self-pay | Admitting: Internal Medicine

## 2018-03-16 DIAGNOSIS — M25561 Pain in right knee: Principal | ICD-10-CM

## 2018-03-16 DIAGNOSIS — G8929 Other chronic pain: Secondary | ICD-10-CM

## 2018-03-16 NOTE — Telephone Encounter (Signed)
Patient would like a call back in regards to her refill request for her    naproxen (NAPROSYN) 500 MG tablet

## 2018-03-16 NOTE — Telephone Encounter (Signed)
Called pt- stated she is still taking naproxen and not taking Ibuprofen. C/o intermittent knee pain.

## 2018-04-02 ENCOUNTER — Ambulatory Visit (INDEPENDENT_AMBULATORY_CARE_PROVIDER_SITE_OTHER): Payer: Medicare HMO | Admitting: Internal Medicine

## 2018-04-02 ENCOUNTER — Other Ambulatory Visit: Payer: Self-pay

## 2018-04-02 ENCOUNTER — Encounter: Payer: Self-pay | Admitting: Internal Medicine

## 2018-04-02 VITALS — BP 138/73 | HR 85 | Temp 98.0°F | Ht 66.0 in | Wt 155.1 lb

## 2018-04-02 DIAGNOSIS — Z79899 Other long term (current) drug therapy: Secondary | ICD-10-CM

## 2018-04-02 DIAGNOSIS — I951 Orthostatic hypotension: Secondary | ICD-10-CM | POA: Diagnosis not present

## 2018-04-02 DIAGNOSIS — M5431 Sciatica, right side: Secondary | ICD-10-CM

## 2018-04-02 DIAGNOSIS — I10 Essential (primary) hypertension: Secondary | ICD-10-CM | POA: Diagnosis not present

## 2018-04-02 MED ORDER — LISINOPRIL 20 MG PO TABS: 20.0000 mg | ORAL_TABLET | Freq: Every day | ORAL | 1 refills | Status: DC

## 2018-04-02 MED ORDER — AMLODIPINE BESYLATE 5 MG PO TABS: 10.0000 mg | ORAL_TABLET | Freq: Every day | ORAL | 0 refills | Status: DC

## 2018-04-02 MED ORDER — SODIUM CHLORIDE 0.9 % IV BOLUS
500.0000 mL | Freq: Once | INTRAVENOUS | Status: AC
  Administered 2018-04-02: 500 mL via INTRAVENOUS

## 2018-04-02 MED ORDER — NAPROXEN 500 MG PO TABS: 500.0000 mg | ORAL_TABLET | Freq: Two times a day (BID) | ORAL | 1 refills | Status: DC

## 2018-04-02 MED ORDER — AMLODIPINE BESYLATE 10 MG PO TABS: 10.0000 mg | ORAL_TABLET | Freq: Every day | ORAL | Status: DC

## 2018-04-02 NOTE — Progress Notes (Signed)
16:45 IV started in R ac using #22G catheter. Flushed x1 w/ 10cc NS. Transparent dressing applied.

## 2018-04-02 NOTE — Assessment & Plan Note (Addendum)
Patient is complaining of episodic dizziness with postural changes. Episodes occur about three times a week. She denies loss of consciousness, but endorses blackening of her vision like she might pass out. Unfortunately patient has had issues with orthostatic hypotension in the past. One year ago she was having similar dizzy spells. Orthostatic vitals in clinic were positive. She was given IV fluids and her thiazide diuretic was discontinued. She was doing well until 3 months ago when the dizzy spells recurred. She denies recent illness and reports staying well hydrated. Orthostatics are positive again today in clinic. Of note, her lisinopril was increased from 20 mg to 40 mg four months ago. Although her systolic is within target range, her diastolic pressure is low. It is possible patient cannot tolerate this intense of a regimen given her low diastolic pressures. We will decrease her lisinopril back to 20 mg daily and allow for a higher systolic pressure. Consider echocardiogram to assess for right heart failure if symptoms persist despite medication changes.  -- 500 cc NS bolus -- Decrease lisinopril 20 mg daily -- BMP at follow up -- Consider echo if symptoms persist despite decreased BP regimen -- 1 month follow up  ADDENDUM: Orthostatic vitals normalized after IV bolus. Symptoms also resolved.

## 2018-04-02 NOTE — Assessment & Plan Note (Signed)
BP at goal today, 135/66. However patient is experiencing dizziness with orthostatic hypotension. Given her low diastolic, we are decreasing her lisinopril back to 20 mg daily. We may have to allow for a slightly higher systolic pressure given her symptoms.  -- Decrease lisinopril 40 mg -> 20 mg daily -- Continue amlodipine 10 mg daily  -- Follow up 1 month

## 2018-04-02 NOTE — Patient Instructions (Addendum)
FOLLOW-UP INSTRUCTIONS When: 1 month  For: BP follow up, dizziness follow up What to bring: Medications   Ms. Katherine Haynes,   It was a pleasure to see you. I am sorry to hear you are having dizzy spells again. We have given you some fluids through an IV today. Please decrease your lisinopril from 40 mg to 20 mg daily. Follow up with me again in 1 month or sooner if you have any issues. If you have any questions or concerns, call our clinic at [phone] or after hours call [phone] and ask for the internal medicine resident on call. Thank you!  - Dr. Philipp Ovens

## 2018-04-02 NOTE — Progress Notes (Signed)
   CC: Dizziness  HPI:  Katherine Haynes is a 67 y.o. female with past medical history outlined below here for dizziness. For the details of today's visit, please refer to the assessment and plan.  Past Medical History:  Diagnosis Date  . Hyperlipidemia   . Hypertension   . Menopause syndrome     Review of Systems  Neurological: Positive for dizziness. Negative for loss of consciousness.    Physical Exam:  Vitals:   04/02/18 1515  BP: 135/66  Pulse: 85  Temp: 98 F (36.7 C)  TempSrc: Oral  Weight: 155 lb 1.6 oz (70.4 kg)    Constitutional: NAD, appears comfortable Cardiovascular: RRR, no murmurs, rubs, or gallops.  Pulmonary/Chest: CTAB, no wheezes, rales, or rhonchi.  Extremities: Warm and well perfused. Peripheral non pitting edema  Psychiatric: Normal mood and affect  Assessment & Plan:   See Encounters Tab for problem based charting.  Patient discussed with Dr. Daryll Drown

## 2018-04-09 ENCOUNTER — Encounter: Payer: Medicare HMO | Admitting: Internal Medicine

## 2018-04-11 DIAGNOSIS — H52209 Unspecified astigmatism, unspecified eye: Secondary | ICD-10-CM | POA: Diagnosis not present

## 2018-04-11 DIAGNOSIS — H524 Presbyopia: Secondary | ICD-10-CM | POA: Diagnosis not present

## 2018-04-11 DIAGNOSIS — H5203 Hypermetropia, bilateral: Secondary | ICD-10-CM | POA: Diagnosis not present

## 2018-04-12 NOTE — Progress Notes (Signed)
Internal Medicine Clinic Attending  Case discussed with Dr. Guilloud at the time of the visit.  We reviewed the resident's history and exam and pertinent patient test results.  I agree with the assessment, diagnosis, and plan of care documented in the resident's note.  

## 2018-04-13 ENCOUNTER — Telehealth: Payer: Self-pay

## 2018-04-13 NOTE — Telephone Encounter (Signed)
Pt states the new Rx for bp is not at the pharmacy. Please call pt back.

## 2018-04-13 NOTE — Telephone Encounter (Signed)
Called pharm, they are getting both lisinopril and amlodipine ready for pickup, will call pt

## 2018-05-24 ENCOUNTER — Other Ambulatory Visit: Payer: Self-pay | Admitting: *Deleted

## 2018-05-24 DIAGNOSIS — I10 Essential (primary) hypertension: Secondary | ICD-10-CM

## 2018-05-24 MED ORDER — LISINOPRIL 20 MG PO TABS: 20.0000 mg | ORAL_TABLET | Freq: Every day | ORAL | 0 refills | Status: DC

## 2018-05-24 NOTE — Telephone Encounter (Signed)
Received faxed 90-day supply refill request from [facility]).  Last appt 04/02/2018  Next appt 08/20/2018 w/pcp.Despina Hidden Cassady11/7/20192:28 PM

## 2018-05-24 NOTE — Addendum Note (Signed)
Addended by: Marcelino Duster on: 05/24/2018 04:08 PM   Modules accepted: Orders

## 2018-05-25 MED ORDER — LISINOPRIL 20 MG PO TABS: 20.0000 mg | ORAL_TABLET | Freq: Every day | ORAL | 0 refills | Status: DC

## 2018-05-28 ENCOUNTER — Ambulatory Visit: Payer: Medicare HMO

## 2018-08-01 ENCOUNTER — Ambulatory Visit (INDEPENDENT_AMBULATORY_CARE_PROVIDER_SITE_OTHER): Payer: Medicare Other | Admitting: Internal Medicine

## 2018-08-01 ENCOUNTER — Encounter: Payer: Self-pay | Admitting: Internal Medicine

## 2018-08-01 ENCOUNTER — Other Ambulatory Visit: Payer: Self-pay

## 2018-08-01 DIAGNOSIS — Z79899 Other long term (current) drug therapy: Secondary | ICD-10-CM

## 2018-08-01 DIAGNOSIS — I10 Essential (primary) hypertension: Secondary | ICD-10-CM

## 2018-08-01 MED ORDER — LISINOPRIL 20 MG PO TABS: 40.0000 mg | ORAL_TABLET | Freq: Every day | ORAL | 0 refills | Status: DC

## 2018-08-01 MED ORDER — AMLODIPINE BESYLATE 10 MG PO TABS: 10.0000 mg | ORAL_TABLET | Freq: Every day | ORAL | 1 refills | Status: DC

## 2018-08-01 NOTE — Patient Instructions (Signed)
FOLLOW-UP INSTRUCTIONS When: One week for follow-up of your high blood pressure.  What to bring: All of your medications   I have increased your lisinopril today. Please take two of the 20mg  tablets daily and return in one week for a BP check and possible lab to make sure your kidney function is okay. If between now and then your blood pressure increases rapidly, or you develop an unremitting headache, please let us know right away.   Thank you for your visit to the [facility] today. If you have any questions or concerns please call us at [phone].

## 2018-08-01 NOTE — Progress Notes (Signed)
   CC: High blood pressure  HPI:[name] is a 68 y.o. female who presents for evaluation of high blood pressure. Please see individual problem based A/P for details.  PHQ-9: Based on the patients    Office Visit from 08/01/2018 in [facility]  PHQ-9 Total Score  3     score we have decided to monitor.  Past Medical History:  Diagnosis Date  . Hyperlipidemia   . Hypertension   . Menopause syndrome    Review of Systems: ROS negative except as per HPI  Physical Exam: Vitals:   08/01/18 1405 08/01/18 1435  BP: (!) 165/89 (!) 176/85  Pulse: 84 87  Temp: 98.2 F (36.8 C)   TempSrc: Oral   SpO2: 99%   Weight: 154 lb 14.4 oz (70.3 kg)    General: A/O x4, afebrile, nondiaphoretic, no acute distress Cardio: RRR, no MRG's is Pulmonary: CTA bilaterally MSK: Bilateral lower extremities are nontender, nonedematous  Assessment & Plan:   See Encounters Tab for problem based charting.  Patient discussed with Dr. Lynnae January

## 2018-08-01 NOTE — Assessment & Plan Note (Addendum)
  HTN: The patient presents today with documented home high blood pressure readings with systolics of 536U and diastolics of upper 44I lower 90s.  Chart review reveals that she had been trialed on hydrochlorothiazide 25 mg in the past appropriately but due to multiple episodes of dehydration this was discontinued.  In addition she had been up titrated appropriately on lisinopril to 40 mg but states she experienced orthostatic symptoms prompting this to be decreased to 20 mg daily.  Today we have agreed to increase the lisinopril to 40 mg daily given her severe hypertension that is asymptomatic.  She denies persistent headache, nausea, vomiting, diarrhea, chest pain, palpitations, or visual changes. She appears to be very sensitive to antihypertensive medication changes do not feel that labetalol or hydralazine would be appropriate.  Plan: We will have her return in 1 week for repeat BP and possible BMP.  If she is unable to tolerate the 40 would recommend 30 mg dose of lisinopril.

## 2018-08-02 ENCOUNTER — Telehealth: Payer: Self-pay

## 2018-08-02 NOTE — Telephone Encounter (Signed)
rtc to pt, gave her dr harbrecht's advice, appt [facility] 1/17 at 1015. Also cautioned for continued worsening chest pain, short of breath, n&v, weakness, change in vision, speech to call 911 for ED, she is agreeable.

## 2018-08-02 NOTE — Telephone Encounter (Signed)
Thank you  Agreed

## 2018-08-02 NOTE — Telephone Encounter (Signed)
Pt states the lisinopril is causing her to have headache. Please call pt back.

## 2018-08-02 NOTE — Telephone Encounter (Signed)
Pt states she took her first increased dose of lisinopril yesterday 1/15 when she picked up the med at pharmacy. Last evening she developed a very bad h/a this continues today, so bad she called into work. It has improved but is still bad. Pain scale at this time 7/10. Denies n&v, chest pain, short of breath but she does after a moment of thinking state she had some little "stabs of pain on her upper L chest, last episode was 0600 today. She is resting bed at present. Taking fluids well. Today only had a piece of toast when she took her meds.  Please advise, sending to dr's harbrecht and guilloud. She is cautioned that if chest discomfort increases or she notes any n&v, weakness, vision changes, speech changes to call 911, she is agreeable. Please advise

## 2018-08-02 NOTE — Telephone Encounter (Signed)
I would advise her to return for evaluation in the clinic today/tomorrow or to visit the ER if her chest pain worsens. I am uncertain as to the cause of her chest pain or headache and do not initially feel that the lisinopril is directly related. She would need to discuss that with one of the [facility] doctors in more detail.

## 2018-08-03 ENCOUNTER — Ambulatory Visit (INDEPENDENT_AMBULATORY_CARE_PROVIDER_SITE_OTHER): Payer: Medicare Other | Admitting: Internal Medicine

## 2018-08-03 ENCOUNTER — Other Ambulatory Visit: Payer: Self-pay

## 2018-08-03 VITALS — BP 155/81 | HR 85 | Temp 98.0°F | Ht 66.0 in | Wt 152.7 lb

## 2018-08-03 DIAGNOSIS — G43009 Migraine without aura, not intractable, without status migrainosus: Secondary | ICD-10-CM | POA: Diagnosis not present

## 2018-08-03 DIAGNOSIS — I10 Essential (primary) hypertension: Secondary | ICD-10-CM | POA: Diagnosis not present

## 2018-08-03 DIAGNOSIS — G43701 Chronic migraine without aura, not intractable, with status migrainosus: Secondary | ICD-10-CM

## 2018-08-03 DIAGNOSIS — Z79899 Other long term (current) drug therapy: Secondary | ICD-10-CM

## 2018-08-03 DIAGNOSIS — G43909 Migraine, unspecified, not intractable, without status migrainosus: Secondary | ICD-10-CM | POA: Insufficient documentation

## 2018-08-03 MED ORDER — IBUPROFEN 600 MG PO TABS: 600.0000 mg | ORAL_TABLET | Freq: Three times a day (TID) | ORAL | 0 refills | Status: DC | PRN

## 2018-08-03 MED ORDER — LOSARTAN POTASSIUM 100 MG PO TABS: 100.0000 mg | ORAL_TABLET | Freq: Every day | ORAL | 2 refills | Status: DC

## 2018-08-03 NOTE — Assessment & Plan Note (Addendum)
Hypertension: Due to persistent hypertension the patient was recently seen and evaluated.  At that time her lisinopril was again uptitrated to 40 mg daily given her persistently high blood pressure readings at home and in clinic to which she was in agreement.  She states however, that upon taking a second 20 mg dose upon arriving home after her clinic visit that she developed a headache.  This headache is described as being very similar to her prior migraines with less photophobia, is unilateral on the left, is not associated with visual changes, or focal neurological deficits.  The headaches are 4/10 in severity.  She has not self treated with NSAIDs, Tylenol, or other medication for the headache.  Although I do not feel that her headaches are directly related to the lisinopril increase as they were present prior to this change, she has been on the lisinopril for several months which roughly coincides with a recurrence of her intermittent migraines.  Plan: Discontinue lisinopril 40 mg daily Initiate losartan 100 mg daily BMP for renal function evaluation Follow-up if symptoms persist or resolve and return

## 2018-08-03 NOTE — Assessment & Plan Note (Addendum)
  Migraines: Mild to moderate severity.  Patient has not self treated, these are described as a left-sided unilateral headache without aura or focal deficits.  They are not sudden onset, not thunderclap in nature, with a physical exam unremarkable for focal deficits.  Plan:  We will make the medication change for her antihypertensives as above on the off chance that the lisinopril is inducing the headaches I have recommended first-line therapy with ibuprofen 600 mg 3 times daily for 3 days for the migraines. She has been advised to remain hydrated with taking NSAIDs in combination with an ARB.  Advised to consume at least 6-8 8 ounce glasses of water daily.

## 2018-08-03 NOTE — Progress Notes (Signed)
Internal Medicine Clinic Attending  Case discussed with Dr. Harbrecht at the time of the visit.  We reviewed the resident's history and exam and pertinent patient test results.  I agree with the assessment, diagnosis, and plan of care documented in the resident's note.   

## 2018-08-03 NOTE — Progress Notes (Signed)
   CC: Hypertension and migraine  HPI:[name] is a 68 y.o. female who presents for evaluation of potential migraine. Please see individual problem based A/P for details.  Past Medical History:  Diagnosis Date  . Hyperlipidemia   . Hypertension   . Menopause syndrome    Review of Systems: ROS negative except as per HPI  Physical Exam: Vitals:   08/03/18 0952  BP: (!) 155/81  Pulse: 85  Temp: 98 F (36.7 C)  TempSrc: Oral  SpO2: 100%  Weight: 152 lb 11.2 oz (69.3 kg)  Height: 5\' 6"  (1.676 m)   General: A/O x4, in no acute distress, afebrile, nondiaphoretic HEENT: PEERL, CNII-XII grossly intact, EMO intact Neuro: Strength 5/5 upper and lower extremities bilaterally  Cardio: RRR, no mrg's Pulmonary: CTA bilaterally MSK: BLE nontender, nonedematous  Assessment & Plan:   See Encounters Tab for problem based charting.  Patient discussed with Dr. Daryll Drown

## 2018-08-03 NOTE — Patient Instructions (Signed)
FOLLOW-UP INSTRUCTIONS When: 1-2 weeks if your symptoms worsen or fail to improve What to bring: All of your medications  I have stopped the lisinopril and started Losartan, please begin taking this tomorrow. Do not take the lisinopril tomorrow. I have added ibuprofen 600mg  up to three times per day for 3 days for the migraines. Please notify us if this treatment fails.   Thank you for your visit to the [facility] today. If you have any questions or concerns please call us at [phone].

## 2018-08-04 LAB — BMP8+ANION GAP
ANION GAP: 17 mmol/L (ref 10.0–18.0)
BUN/Creatinine Ratio: 16 (ref 12–28)
BUN: 15 mg/dL (ref 8–27)
CALCIUM: 9.8 mg/dL (ref 8.7–10.3)
CHLORIDE: 102 mmol/L (ref 96–106)
CO2: 21 mmol/L (ref 20–29)
Creatinine, Ser: 0.94 mg/dL (ref 0.57–1.00)
GFR calc Af Amer: 74 mL/min/{1.73_m2} (ref >59)
GFR calc non Af Amer: 64 mL/min/{1.73_m2} (ref >59)
GLUCOSE: 86 mg/dL (ref 65–99)
Potassium: 4.1 mmol/L (ref 3.5–5.2)
Sodium: 140 mmol/L (ref 134–144)

## 2018-08-08 ENCOUNTER — Telehealth: Payer: Self-pay | Admitting: Internal Medicine

## 2018-08-08 ENCOUNTER — Ambulatory Visit: Payer: Medicare Other

## 2018-08-08 DIAGNOSIS — I10 Essential (primary) hypertension: Secondary | ICD-10-CM

## 2018-08-08 MED ORDER — LOSARTAN POTASSIUM 100 MG PO TABS: 50.0000 mg | ORAL_TABLET | Freq: Every day | ORAL | 2 refills | Status: DC

## 2018-08-08 NOTE — Telephone Encounter (Addendum)
Spoke with patient about some symptomatic hypotension she has been having for the last few days.  She reports blood pressures that are unusually low for her in the 115/70 range.  She reports a general fatigue with this and some orthostatic symptoms.  She took her own orthostatic vital signs this morning which were mildly positive.  She was recently switched from lisinopril to losartan and started taking the losartan the following day.  I would not expect lisinopril to still be in her system at this point.  Her last dose of losartan was almost 24 hours ago.  We came up with a plan together that she would continue to hold her losartan and check her blood pressure every few hours.  She will increase her fluid intake and take the day off of work.  We discussed that whenever her blood pressure goes back up to where she is hypertensive again this seems to be about at 416 or so systolic which would likely be later this evening or tomorrow morning which case she can just wait until tomorrow morning.  At which time,  she can begin to cut her pills in half and try a 50 mg dosage and see how this works for her.  Instructed her if she begins to feel worse, feels lightheaded or dizzy at rest or if her blood pressure goes lower she should seek medical attention and make sure to let her know not to drive herself to use an ambulance or other means of transportation.

## 2018-08-08 NOTE — Addendum Note (Signed)
Addended by: Guadlupe Spanish B on: 08/08/2018 06:35 AM   Modules accepted: Orders

## 2018-08-09 ENCOUNTER — Telehealth: Payer: Self-pay | Admitting: Internal Medicine

## 2018-08-09 DIAGNOSIS — I10 Essential (primary) hypertension: Secondary | ICD-10-CM

## 2018-08-09 MED ORDER — LOSARTAN POTASSIUM 25 MG PO TABS: 25.0000 mg | ORAL_TABLET | Freq: Every day | ORAL | 0 refills | Status: DC

## 2018-08-09 NOTE — Telephone Encounter (Signed)
Called to notify patient of her normal BMP results.  Patient stated that after initiating treatment with 100 mg of losartan she began to feel weak, tired, and now also endorses nausea, abdominal discomfort and associated chills.  She states that she has been unable to attend work.  She states that her blood pressures have ranged from 979-480 systolic and 16P diastolic after initiating the medication.  As such it was appropriate to decrease her dose of Losartan to 50 mg daily per phone conversation with Dr. Shan Levans 08/08/2018.  She again is concerned today for persistence of her symptoms despite this dose change, which I feel she has had insufficient time to have affect.  Given the description of her symptoms I am uncertain of the correlation between her losartan and this presentation as it seems more consistent with a viral illness.  However, given the rapid decrease in blood pressure we will again adjust her losartan down to 25 mg daily for which I have sent a refill.  She is to return on Monday, 08/13/2018 for BP check and follow-up visit.  Kathi Ludwig, MD [facility] Internal Medicine, PGY-2

## 2018-08-11 NOTE — Progress Notes (Signed)
Internal Medicine Clinic Attending  Case discussed with Dr. Harbrecht at the time of the visit.  We reviewed the resident's history and exam and pertinent patient test results.  I agree with the assessment, diagnosis, and plan of care documented in the resident's note.   

## 2018-08-13 ENCOUNTER — Ambulatory Visit (INDEPENDENT_AMBULATORY_CARE_PROVIDER_SITE_OTHER): Payer: Medicare Other | Admitting: Internal Medicine

## 2018-08-13 ENCOUNTER — Other Ambulatory Visit: Payer: Self-pay

## 2018-08-13 DIAGNOSIS — R51 Headache: Secondary | ICD-10-CM | POA: Diagnosis not present

## 2018-08-13 DIAGNOSIS — I10 Essential (primary) hypertension: Secondary | ICD-10-CM | POA: Diagnosis not present

## 2018-08-13 DIAGNOSIS — Z79899 Other long term (current) drug therapy: Secondary | ICD-10-CM | POA: Diagnosis not present

## 2018-08-13 NOTE — Assessment & Plan Note (Addendum)
The patient is currently taking Amlodipine 10 mg and Losartan 25 mg. (Decreased by PCP last week due to hypotension). His last blood pressure visits are. BP Readings from Last 3 Encounters:  08/13/18 (!) 147/70  08/03/18 (!) 155/81  08/01/18 (!) 176/85   Sometimes feel headache but not dizziness. Although her blood pressure is higher than accepted target.  Current dose of amlodipine and losartan seems to be the maximally tolerated dose.  (Patient developed tension with higher dose). We will continue current dose of medication. -Continue amlodipine 10 mg daily - Continue losartan 25 mg daily - Follow-up in clinic in 3 months or sooner as needed    -Ct Amlodipine 10 mg QD -Ct Losartan 25 mg QD

## 2018-08-13 NOTE — Progress Notes (Signed)
   CC: Hypertension follow-up  HPI:  [name] is a 68 y.o. female with past medical history listed below, came into the clinic today for follow-up of her hypertension.  Please refer to problem based charting for further details and assessment and plan.  Past Medical History:  Diagnosis Date  . Hyperlipidemia   . Hypertension   . Menopause syndrome    Review of Systems:  Review of Systems  Constitutional: Negative for chills and fever.  Cardiovascular: Negative for chest pain, orthopnea and leg swelling.  Gastrointestinal: Negative for abdominal pain, nausea and vomiting.  Genitourinary: Negative for dysuria, frequency and urgency.  Neurological: Positive for headaches. Negative for dizziness.     Physical Exam:  Vitals:   08/13/18 1413  BP: (!) 147/70  Pulse: 87  Temp: 98.2 F (36.8 C)  TempSrc: Oral  SpO2: 100%  Weight: 151 lb (68.5 kg)  Height: 5\' 6"  (1.676 m)   Physical Exam Vital signs reviewed. Constitutional: Appears well-developed and well-nourished. No distress.  HENT:   Eyes: Conjunctivae are normal.  Cardiovascular: Normal rate, regular rhythm and normal heart sounds.  Respiratory: Effort normal and breath sounds normal. No respiratory distress. No wheezes.  GI: Soft. Bowel sounds are normal. No distension. There is no tenderness.  Musculoskeletal: No edema.  Neurological: Is alert.  Skin: Not diaphoretic. No erythema.  Psychiatric: Normal mood and affect. Behavior is normal. Judgment and thought content normal.    Assessment & Plan:   See Encounters Tab for problem based charting.  Patient discussed with Dr. Evette Doffing.

## 2018-08-13 NOTE — Patient Instructions (Addendum)
Thank you for allowing [place] to provide your care today. Today we discussed your blood pressure. Please take your blood pressure medications as before and follow up in our clinic if you develop headache, dizziness. Please continue doing great job of recording your blood pressure at home. Please follow-up in 3 months or sooner as needed.   Should you have any questions or concerns please call the internal medicine clinic at [phone].

## 2018-08-16 NOTE — Progress Notes (Signed)
Internal Medicine Clinic Attending  Case discussed with Dr. Masoudi  at the time of the visit.  We reviewed the resident's history and exam and pertinent patient test results.  I agree with the assessment, diagnosis, and plan of care documented in the resident's note.  

## 2018-08-20 ENCOUNTER — Encounter: Payer: Self-pay | Admitting: Internal Medicine

## 2018-08-20 ENCOUNTER — Other Ambulatory Visit: Payer: Self-pay

## 2018-08-20 ENCOUNTER — Ambulatory Visit (INDEPENDENT_AMBULATORY_CARE_PROVIDER_SITE_OTHER): Payer: Medicare Other | Admitting: Internal Medicine

## 2018-08-20 ENCOUNTER — Other Ambulatory Visit: Payer: Self-pay | Admitting: Internal Medicine

## 2018-08-20 VITALS — BP 149/72 | HR 88 | Temp 99.0°F | Ht 66.0 in | Wt 153.9 lb

## 2018-08-20 DIAGNOSIS — Z0000 Encounter for general adult medical examination without abnormal findings: Secondary | ICD-10-CM

## 2018-08-20 DIAGNOSIS — Z23 Encounter for immunization: Secondary | ICD-10-CM | POA: Diagnosis not present

## 2018-08-20 DIAGNOSIS — Z79899 Other long term (current) drug therapy: Secondary | ICD-10-CM

## 2018-08-20 DIAGNOSIS — J302 Other seasonal allergic rhinitis: Secondary | ICD-10-CM | POA: Diagnosis not present

## 2018-08-20 DIAGNOSIS — I10 Essential (primary) hypertension: Secondary | ICD-10-CM | POA: Diagnosis not present

## 2018-08-20 DIAGNOSIS — I951 Orthostatic hypotension: Secondary | ICD-10-CM | POA: Diagnosis not present

## 2018-08-20 DIAGNOSIS — G4700 Insomnia, unspecified: Secondary | ICD-10-CM

## 2018-08-20 DIAGNOSIS — F5101 Primary insomnia: Secondary | ICD-10-CM

## 2018-08-20 DIAGNOSIS — Z1231 Encounter for screening mammogram for malignant neoplasm of breast: Secondary | ICD-10-CM

## 2018-08-20 NOTE — Progress Notes (Signed)
   CC: HTN follow up  HPI:  Katherine Haynes is a 68 y.o. female with past medical history outlined below here for HTN follow up. For the details of today's visit, please refer to the assessment and plan.  Past Medical History:  Diagnosis Date  . Hyperlipidemia   . Hypertension   . Menopause syndrome     Review of Systems  Respiratory: Negative for shortness of breath.   Cardiovascular: Negative for chest pain.    Physical Exam:  Vitals:   08/20/18 1506  BP: (!) 149/72  Pulse: 88  Temp: 99 F (37.2 C)  TempSrc: Oral  SpO2: 100%  Weight: 153 lb 14.4 oz (69.8 kg)  Height: 5\' 6"  (1.676 m)    Constitutional: NAD, appears comfortable Cardiovascular: RRR, no m/r/g Pulmonary/Chest: CTAB, no wheezes, rales, or rhonchi.  Extremities: Warm and well perfused. No edema.  Psychiatric: Normal mood and affect  Assessment & Plan:   See Encounters Tab for problem based charting.  Patient discussed with Dr. Angelia Mould

## 2018-08-20 NOTE — Patient Instructions (Addendum)
Katherine Haynes,  It was a pleasure to see you. I am glad to hear you are feeling better. Please continue to take your medicine as previously prescribed. Please follow up with me in 6 months or sooner if you have any issues. If you have any questions or concerns, call our clinic at [phone] or after hours call [phone] and ask for the internal medicine resident on call. Thank you!  Dr. Philipp Ovens

## 2018-08-21 ENCOUNTER — Encounter: Payer: Self-pay | Admitting: Internal Medicine

## 2018-08-21 NOTE — Assessment & Plan Note (Signed)
Well controlled. Continue Allegra daily.

## 2018-08-21 NOTE — Assessment & Plan Note (Signed)
Continues to use trazodone PRN a few times monthly.

## 2018-08-21 NOTE — Assessment & Plan Note (Signed)
Patient is here for blood pressure follow up. She has been uncontrolled due to side effects of dizziness and orthostatic hypotension with increase in regimen. Unable to tolerate thiazide diuretics. Experienced dizziness with increase in lisinopril, resolved with decreased dose but was changed to losartan due to BP being uncontrolled. Again she developed dizziness, improved with decrease dose to 25 mg daily. Overall we have had to allow for a high blood pressure to control symptoms. Patient brought her home BP log with her today and readings are significantly lower at home than in office. Home readings average 120s / 70s. Highest reading was 142/78. Her BP machine was checked at her last visit and correlated well with our readings here in clinic. I suspect there is a component of white coat hypertension. Will continue current regimen.  -- Amlodipine 10 mg daily -- Losartan 25 mg daily  -- Continue home BP checks  -- Follow up 6 months

## 2018-08-22 NOTE — Progress Notes (Signed)
Internal Medicine Clinic Attending  Case discussed with Dr. Guilloud at the time of the visit.  We reviewed the resident's history and exam and pertinent patient test results.  I agree with the assessment, diagnosis, and plan of care documented in the resident's note.  

## 2018-09-06 ENCOUNTER — Other Ambulatory Visit: Payer: Self-pay

## 2018-09-06 ENCOUNTER — Ambulatory Visit (INDEPENDENT_AMBULATORY_CARE_PROVIDER_SITE_OTHER): Payer: Medicare Other | Admitting: Internal Medicine

## 2018-09-06 VITALS — BP 142/67 | HR 73 | Temp 98.2°F | Ht 66.0 in | Wt 154.3 lb

## 2018-09-06 DIAGNOSIS — I10 Essential (primary) hypertension: Secondary | ICD-10-CM

## 2018-09-06 DIAGNOSIS — L299 Pruritus, unspecified: Secondary | ICD-10-CM | POA: Insufficient documentation

## 2018-09-06 MED ORDER — METOPROLOL TARTRATE 25 MG PO TABS: 12.5000 mg | ORAL_TABLET | Freq: Two times a day (BID) | ORAL | 2 refills | Status: DC

## 2018-09-06 MED ORDER — HYDROXYZINE HCL 50 MG PO TABS: 50.0000 mg | ORAL_TABLET | Freq: Three times a day (TID) | ORAL | 0 refills | Status: DC | PRN

## 2018-09-06 NOTE — Assessment & Plan Note (Addendum)
Patient is here with complaint of diffuse body itching for 2 weeks. On exam there is no rash. She is not jaundiced and her skin is well moisturized. She denies any change in her laundry detergents, soaps, or lotions. She did recently add eggs to her diet, but she stopped eating them one week ago after developing itching which has persisted. Losartan was added to her regimen one month ago. She also endorses a dry cough. Will discontinue losartan and start metoprolol instead.  -- Stop losartan; start metoprolol 12.5 mg BID -- Atarax 50 mg TID prn  -- Follow up 1 month

## 2018-09-06 NOTE — Patient Instructions (Signed)
Katherine Haynes,  For your itching, I recommend using hypoallergenic soaps, laundry detergents, and lotion. Dove soap, free and clear, and eucerin cream are all safe to use.   For your itching you may take atarax, (aka hydroxyzine) three times a day as needed.   If you have any questions or concerns, call our clinic at [phone] or after hours call [phone] and ask for the internal medicine resident on call. Thank you!  Dr. Philipp Ovens

## 2018-09-06 NOTE — Assessment & Plan Note (Signed)
Stopping losartan today due to possible side effect of itching and dry cough.  -- Continue amlodipine -- Stop losartan -- Start metoprolol 12.5 mg BID

## 2018-09-06 NOTE — Progress Notes (Signed)
   CC: itching  HPI:  [name] is a 68 y.o. female with past medical history outlined below here for itching. For the details of today's visit, please refer to the assessment and plan.  Past Medical History:  Diagnosis Date  . Hyperlipidemia   . Hypertension   . Menopause syndrome     Review of Systems  Respiratory: Positive for cough. Negative for shortness of breath.   Skin: Positive for itching. Negative for rash.    Physical Exam:  Vitals:   09/06/18 0838  BP: (!) 142/67  Pulse: 73  Temp: 98.2 F (36.8 C)  TempSrc: Oral  SpO2: 100%  Weight: 154 lb 4.8 oz (70 kg)  Height: 5\' 6"  (1.676 m)    Constitutional: NAD, appears comfortable Cardiovascular: RRR, no m/r/g Pulmonary/Chest: CTAB, no wheezes, rales, or rhonchi.  Skin: No rashes or erythema. Well moisturized.  Psychiatric: Normal mood and affect  Assessment & Plan:   See Encounters Tab for problem based charting.  Patient discussed with Dr. Daryll Drown

## 2018-09-10 NOTE — Progress Notes (Signed)
Internal Medicine Clinic Attending  Case discussed with Dr. Guilloud at the time of the visit.  We reviewed the resident's history and exam and pertinent patient test results.  I agree with the assessment, diagnosis, and plan of care documented in the resident's note.  

## 2018-09-17 ENCOUNTER — Ambulatory Visit
Admission: RE | Admit: 2018-09-17 | Discharge: 2018-09-17 | Disposition: A | Discharge status: Home / Self Care | Payer: Medicare Other | Source: Ambulatory Visit | Attending: Family Medicine | Admitting: Family Medicine

## 2018-09-17 ENCOUNTER — Other Ambulatory Visit: Payer: Self-pay | Admitting: Internal Medicine

## 2018-09-17 DIAGNOSIS — Z1231 Encounter for screening mammogram for malignant neoplasm of breast: Secondary | ICD-10-CM | POA: Diagnosis not present

## 2018-09-17 IMAGING — MG DIGITAL SCREENING BILATERAL MAMMOGRAM WITH TOMO AND CAD
8 series · 8 of 24 positions shown · non-contrast
Comparison: Previous exam(s).

CLINICAL DATA: Screening.

EXAM:
DIGITAL SCREENING BILATERAL MAMMOGRAM WITH TOMO AND CAD

[R CC synth-2D]
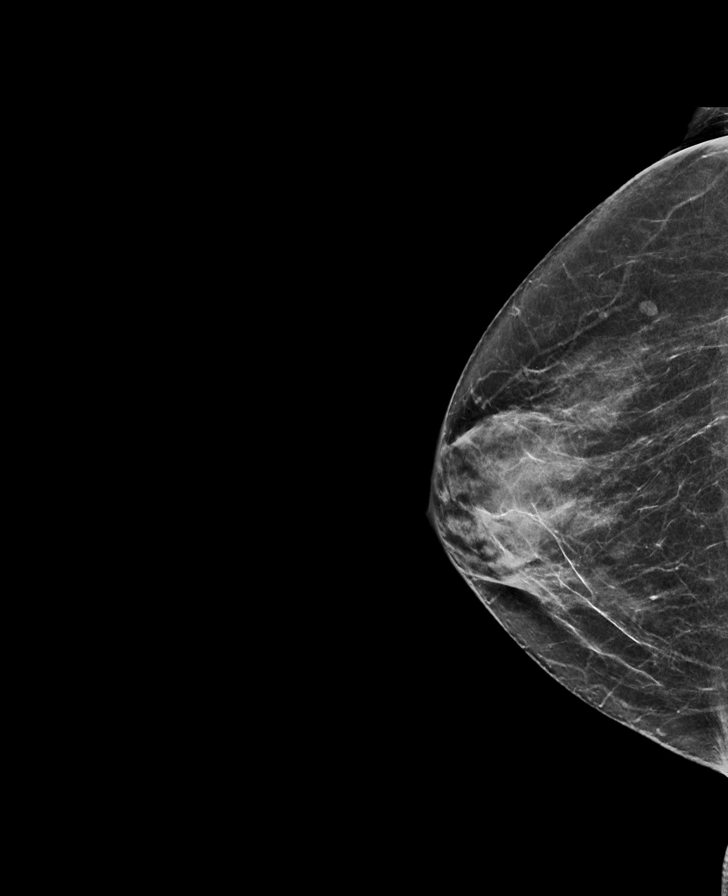

[R MLO synth-2D]
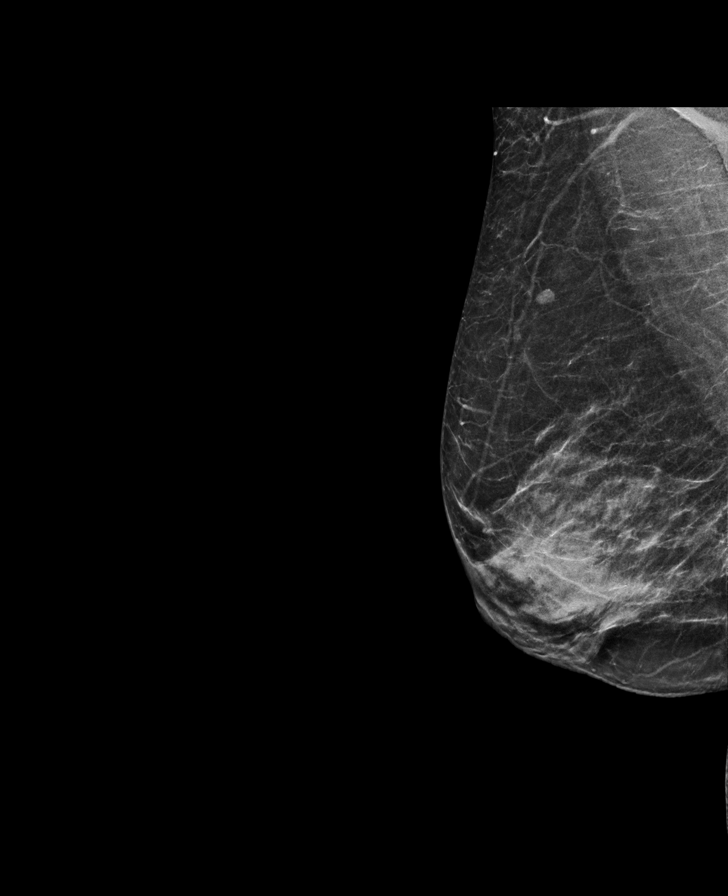

[L CC synth-2D]
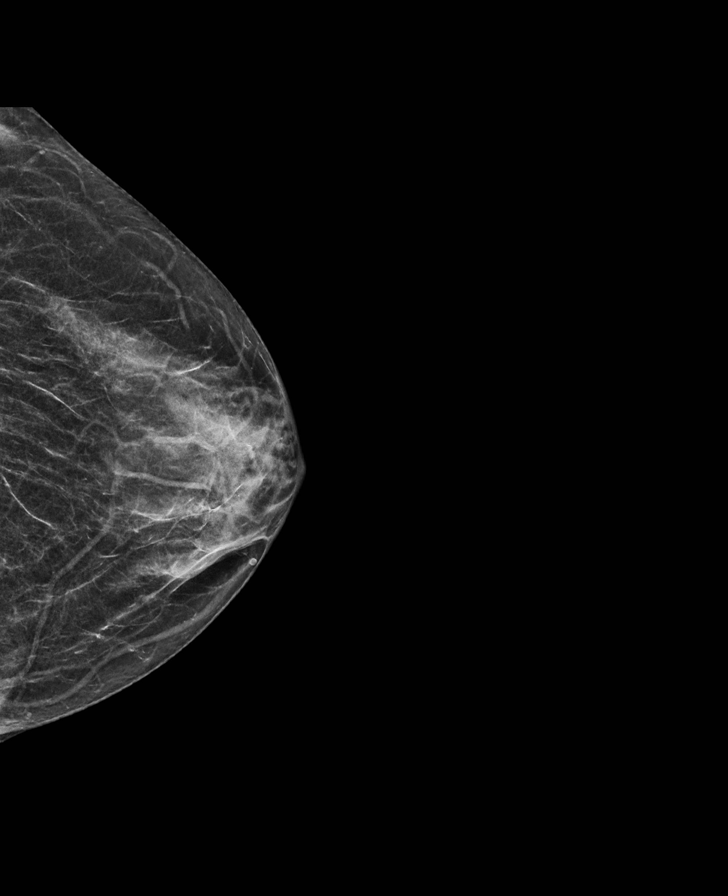

[L MLO synth-2D]
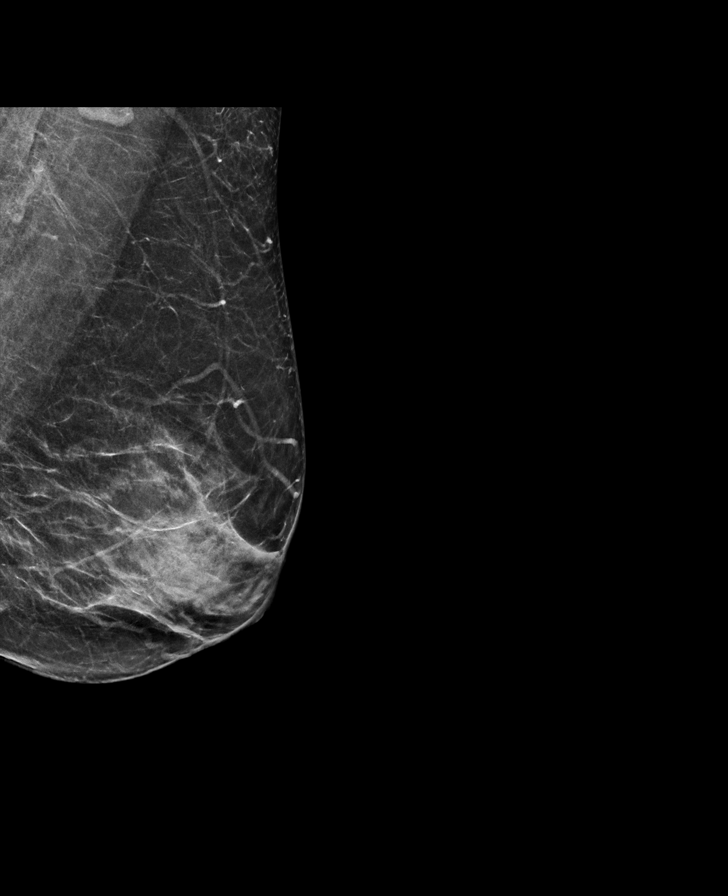

[L MLO tomo · tomo slice 31/61.0]
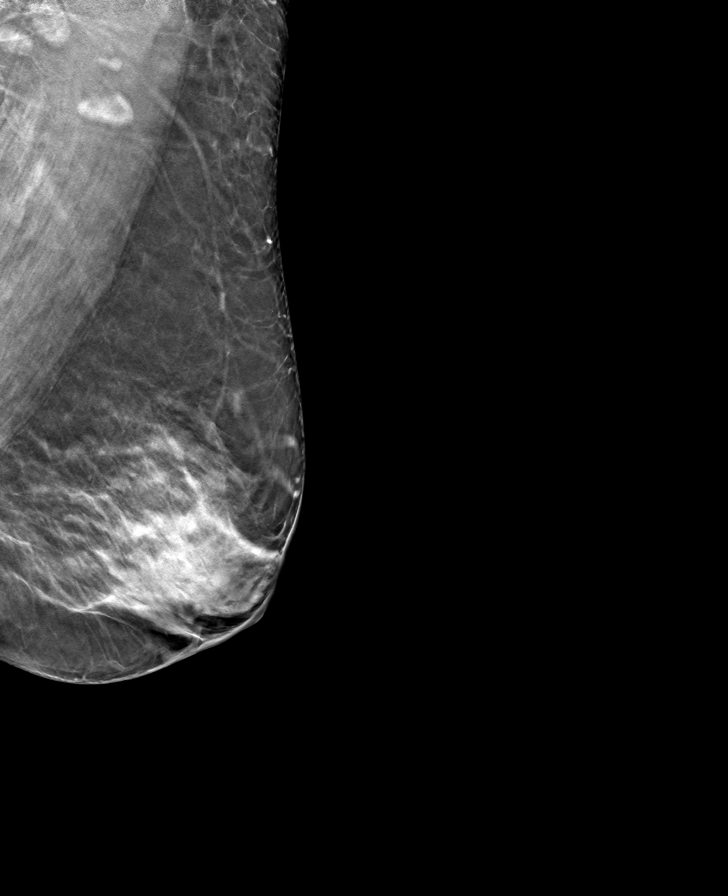

[R MLO tomo · tomo slice 29/58.0]
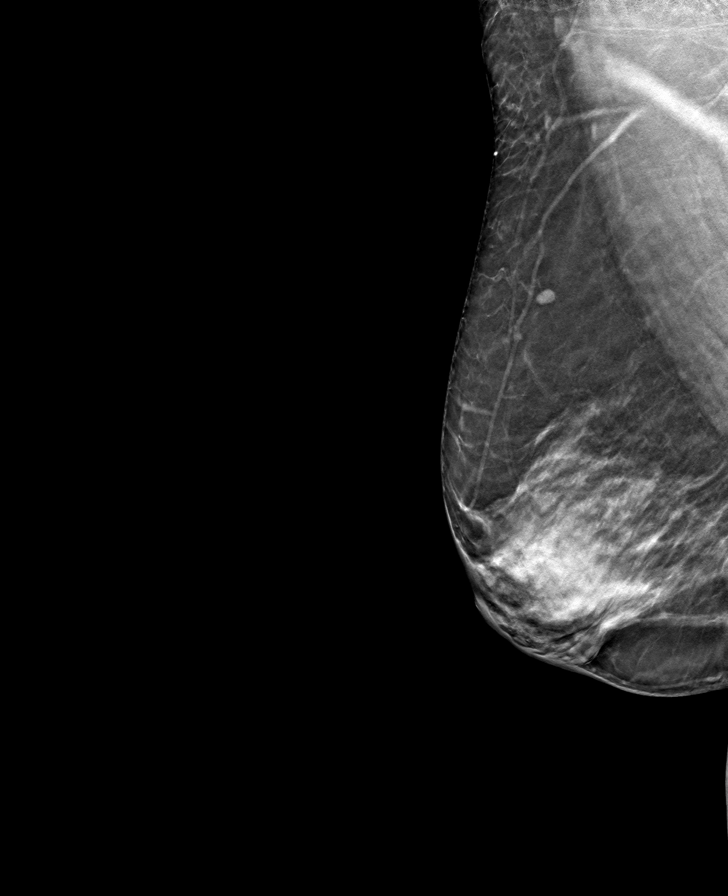

[R CC tomo · tomo slice 31/62.0]
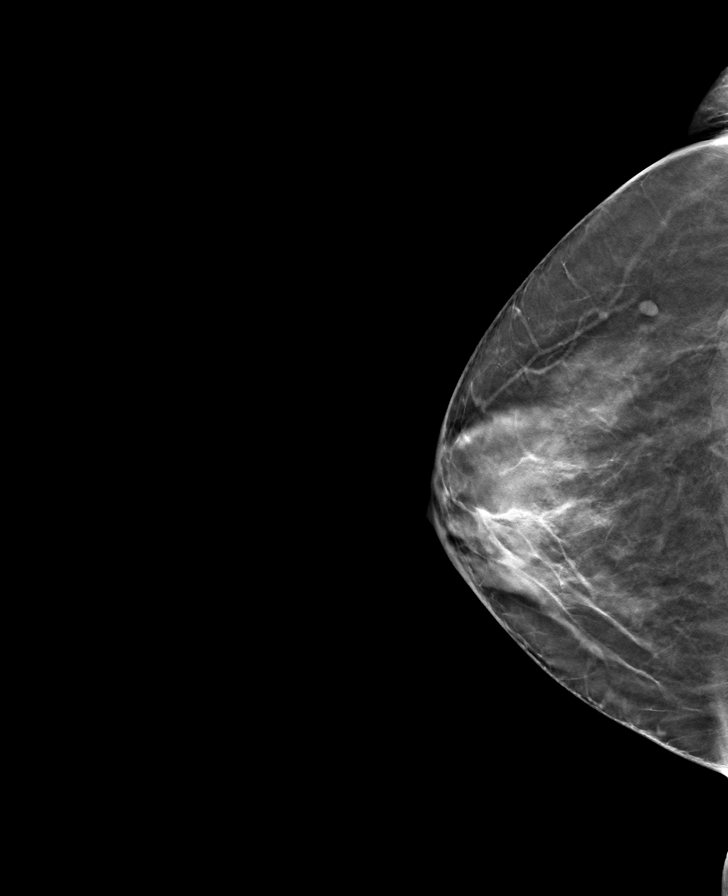

[L CC tomo · tomo slice 27/53.0]
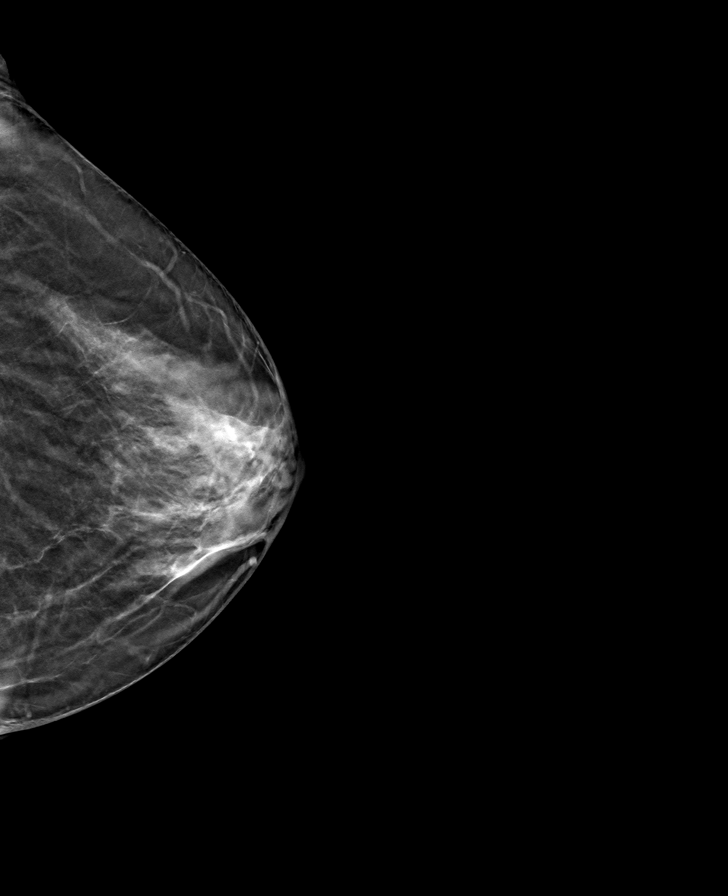

[8 of 24 positions shown; findings below may reference images not displayed]

ACR Breast Density Category c: The breast tissue is heterogeneously
dense, which may obscure small masses.
FINDINGS: There are no findings suspicious for malignancy. Images were
processed with CAD.
IMPRESSION: No mammographic evidence of malignancy. A result letter of this
screening mammogram will be mailed directly to the patient.

RECOMMENDATION:
Screening mammogram in one year. (Code:[5V])

BI-RADS CATEGORY  1: Negative.

## 2018-09-24 ENCOUNTER — Ambulatory Visit (INDEPENDENT_AMBULATORY_CARE_PROVIDER_SITE_OTHER): Payer: Medicare Other | Admitting: *Deleted

## 2018-09-24 DIAGNOSIS — Z111 Encounter for screening for respiratory tuberculosis: Secondary | ICD-10-CM

## 2018-09-27 ENCOUNTER — Other Ambulatory Visit: Payer: Self-pay

## 2018-09-27 ENCOUNTER — Encounter: Payer: Self-pay | Admitting: Internal Medicine

## 2018-09-27 ENCOUNTER — Ambulatory Visit (INDEPENDENT_AMBULATORY_CARE_PROVIDER_SITE_OTHER): Payer: Medicare Other | Admitting: Internal Medicine

## 2018-09-27 VITALS — BP 123/56 | HR 80 | Temp 98.2°F | Ht 66.0 in | Wt 157.7 lb

## 2018-09-27 DIAGNOSIS — Z0000 Encounter for general adult medical examination without abnormal findings: Secondary | ICD-10-CM | POA: Diagnosis not present

## 2018-09-27 DIAGNOSIS — Z1159 Encounter for screening for other viral diseases: Secondary | ICD-10-CM | POA: Diagnosis not present

## 2018-09-27 DIAGNOSIS — E2839 Other primary ovarian failure: Secondary | ICD-10-CM

## 2018-09-27 LAB — TB SKIN TEST
Induration: 0 mm
TB Skin Test: NEGATIVE

## 2018-09-27 NOTE — Progress Notes (Signed)
Subjective:   Katherine Haynes is a 68 y.o. female who presents for a Medicare Annual Wellness Visit.  The following items have been reviewed and updated today in the appropriate area in the EMR.   Health Risk Assessment  Height, weight, BMI, and BP Visual acuity if needed Depression screen Fall risk / safety level Advance directive discussion Medical and family history were reviewed and updated Updating list of other providers & suppliers Medication reconciliation, including over the counter medicines Cognitive screen Written screening schedule Risk Factor list Personalized health advice, risky behaviors, and treatment advice    Social History   Social History Narrative   Lives with husband. Good relationship, feels safe at home. Studying medical administration.       Current Social History 09/27/2018        Patient lives with spouse in an apartment on the second floor. There are 14 steps with handrails up to the entrance the patient uses.       Patient's method of transportation is personal car.      The highest level of education was high school diploma.      The patient currently is employed as a Charity fundraiser in patient's homes.      Identified important Relationships are "My husband, my kids, my grands and great grands."       Pets : None       Interests / Fun: Watching TV, going to beach and park, cooking out with family.       Current Stressors: "finances, teenagers" (grandkids)       Religious / Personal Beliefs: [employer], "I was raised to treat people the way you want to be treated.                Objective:    Vitals: BP (!) 123/56 (BP Location: Left Arm, Patient Position: Sitting, Cuff Size: Normal)   Pulse 80   Temp 98.2 F (36.8 C) (Oral)   Ht 5\' 6"  (1.676 m)   Wt 157 lb 11.2 oz (71.5 kg)   LMP 10/07/1990   SpO2 100%   BMI 25.45 kg/m   Activities of Daily Living In your present state of health, do you have any difficulty performing  the following activities: 09/27/2018 09/06/2018  Hearing? N N  Vision? N N  Comment - -  Difficulty concentrating or making decisions? N N  Walking or climbing stairs? Y Y  Comment "sometimes with right knee pain" knee pain  Dressing or bathing? N N  Doing errands, shopping? N N  Some recent data might be hidden    Goals Goals    . Blood Pressure < 140/90    . Exercise 3x per week (30 min per time) (pt-stated)     15 minutes twice a day 3 days per week Will also look into restarting water aerobics.    Marland Kitchen LDL CALC < 130       Fall Risk Fall Risk  09/27/2018 09/06/2018 08/13/2018 08/03/2018 08/01/2018  Falls in the past year? 0 0 0 1 0  Number falls in past yr: - - - 0 -  Injury with Fall? - - - 0 -  Risk for fall due to : - - - Other (Comment) -  Risk for fall due to: Comment - - - tripped -  Follow up Education provided;Falls prevention discussed Falls prevention discussed - Falls evaluation completed -    Depression Screen [facility] 2/9 Scores 09/27/2018 09/06/2018 08/20/2018 08/13/2018  PHQ -  2 Score 0 0 0 0  PHQ- 9 Score 2 2 2 2   Exception Documentation - - - -     Cognitive Testing I assessed the patient for cognitive issues and the patient did not have issues with his / her cognition.  Mini-Cog  Passed with score 5/5   Assessment and Plan:    Patient has f/u appt already scheduled with PCP. She would like to discuss her hot flashes at that time.  During the course of the visit the patient was educated and counseled about appropriate screening and preventive services as documented in the assessment and plan.  The printed AVS was given to the patient and included an updated screening schedule, a list of risk factors, and personalized health advice.        Velora Heckler, RN  09/27/2018

## 2018-09-27 NOTE — Patient Instructions (Addendum)
Annual Wellness Visit   Medicare Covered Preventative Screenings and Services  Services & Screenings Men and Women Who How Often Need? Date of Last Service Action  Abdominal Aortic Aneurysm Adults with AAA risk factors Once     Alcohol Misuse and Counseling All Adults Screening once a year if no alcohol misuse. Counseling up to 4 face to face sessions.     Bone Density Measurement  Adults at risk for osteoporosis Once every 2 yrs Yes   Order sent to [facility]  Lipid Panel Z13.6 All adults without CV disease Once every 5 yrs     Colorectal Cancer   Stool sample or  Colonoscopy All adults 78 and older   Once every year  Every 10 years No    Depression All Adults Once a year  Today   Diabetes Screening Blood glucose, post glucose load, or GTT Z13.1  All adults at risk  Pre-diabetics  Once per year  Twice per year     Diabetes  Self-Management Training All adults Diabetics 10 hrs first year; 2 hours subsequent years. Requires Copay     Glaucoma  Diabetics  Family history of glaucoma  African Americans 15 yrs +  Hispanic Americans 45 yrs + Annually - requires coppay     Hepatitis C Z72.89 or F19.20  High Risk for HCV  Born between 1945 and 1965  Annually  Once Yes    HIV Z11.4 All adults based on risk  Annually btw ages 36 & 27 regardless of risk  Annually > 65 yrs if at increased risk     Lung Cancer Screening Asymptomatic adults aged 37-77 with 30 pack yr history and current smoker OR quit within the last 15 yrs Annually Must have counseling and shared decision making documentation before first screen     Medical Nutrition Therapy Adults with   Diabetes  Renal disease  Kidney transplant within past 3 yrs 3 hours first year; 2 hours subsequent years     Obesity and Counseling All adults Screening once a year Counseling if BMI 30 or higher  Today   Tobacco Use Counseling Adults who use tobacco  Up to 8 visits in one year     Vaccines Z23  Hepatitis  B  Influenza   Pneumonia  Adults   Once  Once every flu season  Two different vaccines separated by one year     Next Annual Wellness Visit People with Medicare Every year  Today     Services & Screenings Women Who How Often Need  Date of Last Service Action  Mammogram  Z12.31 Women over 29 One baseline ages 61-39. Annually ager 40 yrs+     Pap tests All women Annually if high risk. Every 2 yrs for normal risk women     Screening for cervical cancer with   Pap (Z01.419 nl or Z01.411abnl) &  HPV Z11.51 Women aged 92 to 22 Once every 5 yrs     Screening pelvic and breast exams All women Annually if high risk. Every 2 yrs for normal risk women     Sexually Transmitted Diseases  Chlamydia  Gonorrhea  Syphilis All at risk adults Annually for non pregnant females at increased risk         [place] Men Who How Ofter Need  Date of Last Service Action  Prostate Cancer - DRE & PSA Men over 50 Annually.  DRE might require a copay.     Sexually Transmitted Diseases  Syphilis All at risk adults Annually for men at increased risk         Things That May Be Affecting Your Health:  Alcohol  Hearing loss  Pain    Depression  Home Safety  Sexual Health   Diabetes  Lack of physical activity X Stress   Difficulty with daily activities  Loneliness  Tiredness   Drug use  Medicines  Tobacco use   Falls X Motor Vehicle Safety X Weight   Food choices  Oral Health  Other    YOUR PERSONALIZED HEALTH PLAN : 1. Schedule your next subsequent Medicare Wellness visit in one year 2. Attend all of your regular appointments to address your medical issues 3. Complete the preventative screenings and services 4. Consider restarting water aerobics 5. Good luck with your goal of increasing physical activity (15 minutes twice a day, 3 days per week)    Fall Prevention in the Home, Adult Falls can cause injuries. They can happen to people of all ages. There are many things you  can do to make your home safe and to help prevent falls. Ask for help when making these changes, if needed. What actions can I take to prevent falls? General Instructions  Use good lighting in all rooms. Replace any light bulbs that burn out.  Turn on the lights when you go into a dark area. Use night-lights.  Keep items that you use often in easy-to-reach places. Lower the shelves around your home if necessary.  Set up your furniture so you have a clear path. Avoid moving your furniture around.  Do not have throw rugs and other things on the floor that can make you trip.  Avoid walking on wet floors.  If any of your floors are uneven, fix them.  Add color or contrast paint or tape to clearly mark and help you see: ? Any grab bars or handrails. ? First and last steps of stairways. ? Where the edge of each step is.  If you use a stepladder: ? Make sure that it is fully opened. Do not climb a closed stepladder. ? Make sure that both sides of the stepladder are locked into place. ? Ask someone to hold the stepladder for you while you use it.  If there are any pets around you, be aware of where they are. What can I do in the bathroom?      Keep the floor dry. Clean up any water that spills onto the floor as soon as it happens.  Remove soap buildup in the tub or shower regularly.  Use non-skid mats or decals on the floor of the tub or shower.  Attach bath mats securely with double-sided, non-slip rug tape.  If you need to sit down in the shower, use a plastic, non-slip stool.  Install grab bars by the toilet and in the tub and shower. Do not use towel bars as grab bars. What can I do in the bedroom?  Make sure that you have a light by your bed that is easy to reach.  Do not use any sheets or blankets that are too big for your bed. They should not hang down onto the floor.  Have a firm chair that has side arms. You can use this for support while you get dressed. What can  I do in the kitchen?  Clean up any spills right away.  If you need to reach something above you, use a strong step stool that has a grab bar.  Keep electrical cords out of the way.  Do not use floor polish or wax that makes floors slippery. If you must use wax, use non-skid floor wax. What can I do with my stairs?  Do not leave any items on the stairs.  Make sure that you have a light switch at the top of the stairs and the bottom of the stairs. If you do not have them, ask someone to add them for you.  Make sure that there are handrails on both sides of the stairs, and use them. Fix handrails that are broken or loose. Make sure that handrails are as long as the stairways.  Install non-slip stair treads on all stairs in your home.  Avoid having throw rugs at the top or bottom of the stairs. If you do have throw rugs, attach them to the floor with carpet tape.  Choose a carpet that does not hide the edge of the steps on the stairway.  Check any carpeting to make sure that it is firmly attached to the stairs. Fix any carpet that is loose or worn. What can I do on the outside of my home?  Use bright outdoor lighting.  Regularly fix the edges of walkways and driveways and fix any cracks.  Remove anything that might make you trip as you walk through a door, such as a raised step or threshold.  Trim any bushes or trees on the path to your home.  Regularly check to see if handrails are loose or broken. Make sure that both sides of any steps have handrails.  Install guardrails along the edges of any raised decks and porches.  Clear walking paths of anything that might make someone trip, such as tools or rocks.  Have any leaves, snow, or ice cleared regularly.  Use sand or salt on walking paths during winter.  Clean up any spills in your garage right away. This includes grease or oil spills. What other actions can I take?  Wear shoes that: ? Have a low heel. Do not wear high  heels. ? Have rubber bottoms. ? Are comfortable and fit you well. ? Are closed at the toe. Do not wear open-toe sandals.  Use tools that help you move around (mobility aids) if they are needed. These include: ? Canes. ? Walkers. ? Scooters. ? Crutches.  Review your medicines with your doctor. Some medicines can make you feel dizzy. This can increase your chance of falling. Ask your doctor what other things you can do to help prevent falls. Where to find more information  Centers for Disease Control and Prevention, STEADI: https://garcia.biz/  [employer] on Aging: BrainJudge.co.uk Contact a doctor if:  You are afraid of falling at home.  You feel weak, drowsy, or dizzy at home.  You fall at home. Summary  There are many simple things that you can do to make your home safe and to help prevent falls.  Ways to make your home safe include removing tripping hazards and installing grab bars in the bathroom.  Ask for help when making these changes in your home. This information is not intended to replace advice given to you by your health care provider. Make sure you discuss any questions you have with your health care provider. Document Released: 04/30/2009 Document Revised: 02/16/2017 Document Reviewed: 02/16/2017 Elsevier Interactive Patient Education  2019 [employer] Maintenance, Female Adopting a healthy lifestyle and getting preventive care can go a long way to promote health and wellness. Talk with  your health care provider about what schedule of regular examinations is right for you. This is a good chance for you to check in with your provider about disease prevention and staying healthy. In between checkups, there are plenty of things you can do on your own. Experts have done a lot of research about which lifestyle changes and preventive measures are most likely to keep you healthy. Ask your health care provider for more information. Weight and  diet Eat a healthy diet  Be sure to include plenty of vegetables, fruits, low-fat dairy products, and lean protein.  Do not eat a lot of foods high in solid fats, added sugars, or salt.  Get regular exercise. This is one of the most important things you can do for your health. ? Most adults should exercise for at least 150 minutes each week. The exercise should increase your heart rate and make you sweat (moderate-intensity exercise). ? Most adults should also do strengthening exercises at least twice a week. This is in addition to the moderate-intensity exercise. Maintain a healthy weight  Body mass index (BMI) is a measurement that can be used to identify possible weight problems. It estimates body fat based on height and weight. Your health care provider can help determine your BMI and help you achieve or maintain a healthy weight.  For females 65 years of age and older: ? A BMI below 18.5 is considered underweight. ? A BMI of 18.5 to 24.9 is normal. ? A BMI of 25 to 29.9 is considered overweight. ? A BMI of 30 and above is considered obese. Watch levels of cholesterol and blood lipids  You should start having your blood tested for lipids and cholesterol at 66 years of age, then have this test every 5 years.  You may need to have your cholesterol levels checked more often if: ? Your lipid or cholesterol levels are high. ? You are older than 66 years of age. ? You are at high risk for heart disease. Cancer screening Lung Cancer  Lung cancer screening is recommended for adults 46-25 years old who are at high risk for lung cancer because of a history of smoking.  A yearly low-dose CT scan of the lungs is recommended for people who: ? Currently smoke. ? Have quit within the past 15 years. ? Have at least a 30-pack-year history of smoking. A pack year is smoking an average of one pack of cigarettes a day for 1 year.  Yearly screening should continue until it has been 15 years since  you quit.  Yearly screening should stop if you develop a health problem that would prevent you from having lung cancer treatment. Breast Cancer  Practice breast self-awareness. This means understanding how your breasts normally appear and feel.  It also means doing regular breast self-exams. Let your health care provider know about any changes, no matter how small.  If you are in your 20s or 30s, you should have a clinical breast exam (CBE) by a health care provider every 1-3 years as part of a regular health exam.  If you are 30 or older, have a CBE every year. Also consider having a breast X-ray (mammogram) every year.  If you have a family history of breast cancer, talk to your health care provider about genetic screening.  If you are at high risk for breast cancer, talk to your health care provider about having an MRI and a mammogram every year.  Breast cancer gene (BRCA) assessment is recommended  for women who have family members with BRCA-related cancers. BRCA-related cancers include: ? Breast. ? Ovarian. ? Tubal. ? Peritoneal cancers.  Results of the assessment will determine the need for genetic counseling and BRCA1 and BRCA2 testing. Cervical Cancer Your health care provider may recommend that you be screened regularly for cancer of the pelvic organs (ovaries, uterus, and vagina). This screening involves a pelvic examination, including checking for microscopic changes to the surface of your cervix (Pap test). You may be encouraged to have this screening done every 3 years, beginning at age 37.  For women ages 43-65, health care providers may recommend pelvic exams and Pap testing every 3 years, or they may recommend the Pap and pelvic exam, combined with testing for human papilloma virus (HPV), every 5 years. Some types of HPV increase your risk of cervical cancer. Testing for HPV may also be done on women of any age with unclear Pap test results.  Other health care providers  may not recommend any screening for nonpregnant women who are considered low risk for pelvic cancer and who do not have symptoms. Ask your health care provider if a screening pelvic exam is right for you.  If you have had past treatment for cervical cancer or a condition that could lead to cancer, you need Pap tests and screening for cancer for at least 20 years after your treatment. If Pap tests have been discontinued, your risk factors (such as having a new sexual partner) need to be reassessed to determine if screening should resume. Some women have medical problems that increase the chance of getting cervical cancer. In these cases, your health care provider may recommend more frequent screening and Pap tests. Colorectal Cancer  This type of cancer can be detected and often prevented.  Routine colorectal cancer screening usually begins at 66 years of age and continues through 66 years of age.  Your health care provider may recommend screening at an earlier age if you have risk factors for colon cancer.  Your health care provider may also recommend using home test kits to check for hidden blood in the stool.  A small camera at the end of a tube can be used to examine your colon directly (sigmoidoscopy or colonoscopy). This is done to check for the earliest forms of colorectal cancer.  Routine screening usually begins at age 48.  Direct examination of the colon should be repeated every 5-10 years through 66 years of age. However, you may need to be screened more often if early forms of precancerous polyps or small growths are found. Skin Cancer  Check your skin from head to toe regularly.  Tell your health care provider about any new moles or changes in moles, especially if there is a change in a mole's shape or color.  Also tell your health care provider if you have a mole that is larger than the size of a pencil eraser.  Always use sunscreen. Apply sunscreen liberally and repeatedly  throughout the day.  Protect yourself by wearing long sleeves, pants, a wide-brimmed hat, and sunglasses whenever you are outside. Heart disease, diabetes, and high blood pressure  High blood pressure causes heart disease and increases the risk of stroke. High blood pressure is more likely to develop in: ? People who have blood pressure in the high end of the normal range (130-139/85-89 mm Hg). ? People who are overweight or obese. ? People who are African American.  If you are 57-51 years of age, have your blood  pressure checked every 3-5 years. If you are 20 years of age or older, have your blood pressure checked every year. You should have your blood pressure measured twice--once when you are at a hospital or clinic, and once when you are not at a hospital or clinic. Record the average of the two measurements. To check your blood pressure when you are not at a hospital or clinic, you can use: ? An automated blood pressure machine at a pharmacy. ? A home blood pressure monitor.  If you are between 69 years and 26 years old, ask your health care provider if you should take aspirin to prevent strokes.  Have regular diabetes screenings. This involves taking a blood sample to check your fasting blood sugar level. ? If you are at a normal weight and have a low risk for diabetes, have this test once every three years after 66 years of age. ? If you are overweight and have a high risk for diabetes, consider being tested at a younger age or more often. Preventing infection Hepatitis B  If you have a higher risk for hepatitis B, you should be screened for this virus. You are considered at high risk for hepatitis B if: ? You were born in a country where hepatitis B is common. Ask your health care provider which countries are considered high risk. ? Your parents were born in a high-risk country, and you have not been immunized against hepatitis B (hepatitis B vaccine). ? You have HIV or AIDS. ? You  use needles to inject street drugs. ? You live with someone who has hepatitis B. ? You have had sex with someone who has hepatitis B. ? You get hemodialysis treatment. ? You take certain medicines for conditions, including cancer, organ transplantation, and autoimmune conditions. Hepatitis C  Blood testing is recommended for: ? Everyone born from [birth year] through 1963. ? Anyone with known risk factors for hepatitis C. Sexually transmitted infections (STIs)  You should be screened for sexually transmitted infections (STIs) including gonorrhea and chlamydia if: ? You are sexually active and are younger than 66 years of age. ? You are older than 66 years of age and your health care provider tells you that you are at risk for this type of infection. ? Your sexual activity has changed since you were last screened and you are at an increased risk for chlamydia or gonorrhea. Ask your health care provider if you are at risk.  If you do not have HIV, but are at risk, it may be recommended that you take a prescription medicine daily to prevent HIV infection. This is called pre-exposure prophylaxis (PrEP). You are considered at risk if: ? You are sexually active and do not regularly use condoms or know the HIV status of your partner(s). ? You take drugs by injection. ? You are sexually active with a partner who has HIV. Talk with your health care provider about whether you are at high risk of being infected with HIV. If you choose to begin PrEP, you should first be tested for HIV. You should then be tested every 3 months for as long as you are taking PrEP. Pregnancy  If you are premenopausal and you may become pregnant, ask your health care provider about preconception counseling.  If you may become pregnant, take 400 to 800 micrograms (mcg) of folic acid every day.  If you want to prevent pregnancy, talk to your health care provider about birth control (contraception). Osteoporosis and  menopause  Osteoporosis is a disease in which the bones lose minerals and strength with aging. This can result in serious bone fractures. Your risk for osteoporosis can be identified using a bone density scan.  If you are 35 years of age or older, or if you are at risk for osteoporosis and fractures, ask your health care provider if you should be screened.  Ask your health care provider whether you should take a calcium or vitamin D supplement to lower your risk for osteoporosis.  Menopause may have certain physical symptoms and risks.  Hormone replacement therapy may reduce some of these symptoms and risks. Talk to your health care provider about whether hormone replacement therapy is right for you. Follow these instructions at home:  Schedule regular health, dental, and eye exams.  Stay current with your immunizations.  Do not use any tobacco products including cigarettes, chewing tobacco, or electronic cigarettes.  If you are pregnant, do not drink alcohol.  If you are breastfeeding, limit how much and how often you drink alcohol.  Limit alcohol intake to no more than 1 drink per day for nonpregnant women. One drink equals 12 ounces of beer, 5 ounces of wine, or 1 ounces of hard liquor.  Do not use street drugs.  Do not share needles.  Ask your health care provider for help if you need support or information about quitting drugs.  Tell your health care provider if you often feel depressed.  Tell your health care provider if you have ever been abused or do not feel safe at home. This information is not intended to replace advice given to you by your health care provider. Make sure you discuss any questions you have with your health care provider. Document Released: 01/17/2011 Document Revised: 12/10/2015 Document Reviewed: 04/07/2015 Elsevier Interactive Patient Education  2019 [employer].

## 2018-09-28 LAB — HEPATITIS C ANTIBODY: Hep C Virus Ab: <0.1 s/co ratio (ref 0.0–0.9)

## 2018-09-28 NOTE — Progress Notes (Signed)
Internal Medicine Clinic Attending  Case discussed with Dr. Philipp Ovens at the time of the visit.  We reviewed the AWV findings.  I agree with the assessment, diagnosis, and plan of care documented in the AWV note.

## 2018-10-01 ENCOUNTER — Other Ambulatory Visit: Payer: Self-pay | Admitting: Internal Medicine

## 2018-10-01 ENCOUNTER — Encounter: Payer: Self-pay | Admitting: Internal Medicine

## 2018-10-01 DIAGNOSIS — L299 Pruritus, unspecified: Secondary | ICD-10-CM

## 2018-10-08 ENCOUNTER — Encounter: Payer: Medicare Other | Admitting: Internal Medicine

## 2018-10-16 ENCOUNTER — Other Ambulatory Visit: Payer: Self-pay

## 2018-10-16 NOTE — Patient Outreach (Signed)
[facility]) Care Management  10/16/2018  [name] Dec 01, 1950 [id number]   Medication Adherence call to Mrs. [name] [employer] spoke with patient she is past due on Losartan 25 mg,patient explain she is no longer taking this medication doctor took her off. [name] is showing past due under [place].   [place] Management Direct Dial [phone]  Fax [phone] [email or web address]

## 2018-10-30 DIAGNOSIS — M25531 Pain in right wrist: Secondary | ICD-10-CM | POA: Diagnosis not present

## 2018-11-30 ENCOUNTER — Other Ambulatory Visit: Payer: Self-pay | Admitting: Internal Medicine

## 2018-11-30 DIAGNOSIS — I10 Essential (primary) hypertension: Secondary | ICD-10-CM

## 2018-12-11 ENCOUNTER — Ambulatory Visit (INDEPENDENT_AMBULATORY_CARE_PROVIDER_SITE_OTHER): Payer: Medicare Other | Admitting: Internal Medicine

## 2018-12-11 ENCOUNTER — Other Ambulatory Visit: Payer: Self-pay

## 2018-12-11 DIAGNOSIS — Z79899 Other long term (current) drug therapy: Secondary | ICD-10-CM | POA: Diagnosis not present

## 2018-12-11 DIAGNOSIS — R600 Localized edema: Secondary | ICD-10-CM

## 2018-12-11 DIAGNOSIS — I10 Essential (primary) hypertension: Secondary | ICD-10-CM | POA: Diagnosis not present

## 2018-12-11 DIAGNOSIS — I890 Lymphedema, not elsewhere classified: Secondary | ICD-10-CM | POA: Insufficient documentation

## 2018-12-11 MED ORDER — METOPROLOL TARTRATE 25 MG PO TABS: 25.0000 mg | ORAL_TABLET | Freq: Two times a day (BID) | ORAL | 2 refills | Status: DC

## 2018-12-11 NOTE — Patient Instructions (Addendum)
Thank you for coming to the clinic today. It was a pleasure to see you.   For your leg swelling  Please hold off on taking the amlodipine  Increase your metoprolol dose to 25 mg twice per day ( previously you were prescribed 12.5 mg twice daily )  Follow up in two weeks so we can recheck your blood pressure and make further recommendations   Please call the internal medicine center clinic if you have any questions or concerns, we may be able to help and keep you from a long and expensive emergency room wait. Our clinic and after hours phone number is [phone], the best time to call is Monday through Friday 9 am to 4 pm but there is always someone available 24/7 if you have an emergency. If you need medication refills please notify your pharmacy one week in advance and they will send [place] a request.

## 2018-12-11 NOTE — Assessment & Plan Note (Signed)
Currently treated with metoprolol tartrate and amlodipine. Presenting today for evaluation of lower extremity swelling.  Has had side effects to multiple medications in the past - losartan caused a cough, lisinopril was felt to exacerbate migraines, and HCTZ was complicated by dehydration requiring in office fluid resuscitation twice. On review today Ms. Bentsen realized she has actually been taking metoprolol 25 mg once daily instead of the 12.5 mg BID prescribed dose, she has no signs of orthostatic dizziness with this.   - discontinue amlodipine for lower extremity swelling - increase metoprolol tartrate to 25 mg BID ( was previously taking 25 mg qd)

## 2018-12-11 NOTE — Progress Notes (Signed)
   CC: evaluation of lower extremity swelling   HPI:  [name] is a 68 y.o. with PMH as listed below who presents for  evaluation of lower extremity swelling . Please see the assessment and plans for the status of the patient chronic medical problems.    Past Medical History:  Diagnosis Date  . Hyperlipidemia   . Hypertension   . Menopause syndrome    Review of Systems:  Refer to history of present illness and assessment and plans for pertinent review of systems, all others reviewed and negative0  Physical Exam:  Vitals:   12/11/18 0905  BP: 139/72  Pulse: 70  Temp: 98.2 F (36.8 C)  TempSrc: Oral  SpO2: 100%  Weight: 161 lb 4.8 oz (73.2 kg)  Height: 5\' 6"  (1.676 m)   General: well appearing, not in acute distress  Cardiac: regular rate and rhythm, no murmurs rubs or gallops, 1+ bilateral lower extremity non pitting edema  Pulm: lungs clear to auscultation, no wheezes or rhonchi   Abdomen: soft, non tender, non distended  Assessment & Plan:   Lower extremity edema  Patient Describes three months of lower extremity swelling. She denies associated shortness of breath, orthopnea, PND, chest pressure or any other associated symptoms. Renal function was checked in January and was normal at that time. No known major cardiac history.  - Trial of discontinued amlodipine   Hypertension  Currently treated with metoprolol tartrate and amlodipine. Presenting today for evaluation of lower extremity swelling.  Has had side effects to multiple medications in the past - losartan caused a cough, lisinopril was felt to exacerbate migraines, and HCTZ was complicated by dehydration requiring in office fluid resuscitation twice. On review today [name] realized she has actually been taking metoprolol 25 mg once daily instead of the 12.5 mg BID prescribed dose, she has no signs of orthostatic dizziness with this.   - discontinue amlodipine for lower extremity swelling - increase  metoprolol tartrate to 25 mg BID ( was previously taking 25 mg qd)  Follow up with pcp in one month   See Encounters Tab for problem based charting.  Patient discussed with Dr. Lynnae January

## 2018-12-11 NOTE — Assessment & Plan Note (Signed)
Patient Describes three months of lower extremity swelling. She denies associated shortness of breath, orthopnea, PND, chest pressure or any other associated symptoms. Renal function was checked in January and was normal at that time. No known major cardiac history.  - Trial of discontinued amlodipine

## 2018-12-11 NOTE — Progress Notes (Signed)
Internal Medicine Clinic Attending  Case discussed with Dr. Hetty Ely at the time of the visit.  We reviewed the resident's history and exam and pertinent patient test results.  I agree with the assessment, diagnosis, and plan of care documented in the resident's note.   At her blood pressure is not well controlled on the new dose of metoprolol, a different ARB or hydralazine could be tried.  Losartan has a high incidence of cough, higher than other ARB's.  Therefore a different ARB may not cause the same side effect.

## 2018-12-18 ENCOUNTER — Other Ambulatory Visit: Payer: Self-pay

## 2018-12-18 ENCOUNTER — Ambulatory Visit
Admission: RE | Admit: 2018-12-18 | Discharge: 2018-12-18 | Disposition: A | Discharge status: Home / Self Care | Payer: Medicare Other | Source: Ambulatory Visit | Attending: Internal Medicine | Admitting: Internal Medicine

## 2018-12-18 DIAGNOSIS — E2839 Other primary ovarian failure: Secondary | ICD-10-CM

## 2018-12-18 DIAGNOSIS — M810 Age-related osteoporosis without current pathological fracture: Secondary | ICD-10-CM | POA: Diagnosis not present

## 2018-12-25 ENCOUNTER — Ambulatory Visit: Payer: Medicare Other

## 2019-01-07 ENCOUNTER — Ambulatory Visit (INDEPENDENT_AMBULATORY_CARE_PROVIDER_SITE_OTHER): Payer: Medicare Other | Admitting: Internal Medicine

## 2019-01-07 ENCOUNTER — Encounter: Payer: Self-pay | Admitting: Internal Medicine

## 2019-01-07 ENCOUNTER — Other Ambulatory Visit: Payer: Self-pay

## 2019-01-07 VITALS — BP 159/71 | HR 66 | Temp 98.4°F | Ht 66.0 in | Wt 165.5 lb

## 2019-01-07 DIAGNOSIS — E785 Hyperlipidemia, unspecified: Secondary | ICD-10-CM

## 2019-01-07 DIAGNOSIS — N951 Menopausal and female climacteric states: Secondary | ICD-10-CM | POA: Insufficient documentation

## 2019-01-07 DIAGNOSIS — Z79899 Other long term (current) drug therapy: Secondary | ICD-10-CM

## 2019-01-07 DIAGNOSIS — I10 Essential (primary) hypertension: Secondary | ICD-10-CM | POA: Diagnosis not present

## 2019-01-07 DIAGNOSIS — R232 Flushing: Secondary | ICD-10-CM | POA: Diagnosis not present

## 2019-01-07 DIAGNOSIS — R600 Localized edema: Secondary | ICD-10-CM | POA: Diagnosis not present

## 2019-01-07 MED ORDER — AMLODIPINE BESYLATE 10 MG PO TABS: 10.0000 mg | ORAL_TABLET | Freq: Every day | ORAL | 3 refills | Status: DC

## 2019-01-07 MED ORDER — GABAPENTIN 300 MG PO CAPS: 300.0000 mg | ORAL_CAPSULE | Freq: Every day | ORAL | 2 refills | Status: DC

## 2019-01-07 NOTE — Patient Instructions (Signed)
Katherine Haynes,  It was a pleasure to see you. I am sorry that your leg swelling has not improved. I am checking some labs today including your thyroid and liver function. I will call you with the results. I have ordered an ultrasound of your legs, you will be called to schedule this with radiology.  For your hot flashes, I have started a medication called gabapentin. Please take this once daily at night before bed. If your symptoms do not improve, we may need to try other medications.   Please restart your amlodipine in addition to your twice daily metoprolol.   Follow up with me again in 3 month or sooner if needed. If you have any questions or concerns, call our clinic at [phone] or after hours call [phone] and ask for the internal medicine resident on call. Thank you!  Dr. Philipp Ovens

## 2019-01-07 NOTE — Assessment & Plan Note (Signed)
Patient has uncontrolled HTN unable to tolerate ACEi/ARBs due to cough and migraines, and thiazide diuretics due to orthostatic hypotension and presyncope. She was previously on amlodipine and metoprolol, but her amlodipine was discontinued at her last appointment due to new lower extremity edema. Her swelling has not improved. She is currently taking metoprolol 25 mg BID and BP is 159/71 today. Will restart amlodipine today given her swelling did not improve. Suspect venous insufficiency. -- Continue metoprolol 25 mg BID -- Restart amlodipine 10 mg daily  -- Follow up 3 months

## 2019-01-07 NOTE — Progress Notes (Signed)
   CC: HTN follow up  HPI:  [name] is a 68 y.o. female with past medical history outlined below here for HTN follow up. For the details of today's visit, please refer to the assessment and plan.  Past Medical History:  Diagnosis Date  . Hyperlipidemia   . Hypertension   . Menopause syndrome     Review of Systems  Respiratory: Negative for shortness of breath.   Cardiovascular: Positive for leg swelling. Negative for chest pain.    Physical Exam:  Vitals:   01/07/19 1439  BP: (!) 159/71  Pulse: 66  Temp: 98.4 F (36.9 C)  TempSrc: Oral  SpO2: 99%  Weight: 165 lb 8 oz (75.1 kg)  Height: 5\' 6"  (1.676 m)    Constitutional: NAD, appears comfortable Cardiovascular: RRR, no m/r/g Pulmonary/Chest: CTAB, no wheezes, rales, or rhonchi.  Extremities: Warm and well perfused. Bilateral non pitting edema above the ankles bilaterally (L>R) Skin: No rashes or erythema  Psychiatric: Normal mood and affect  Assessment & Plan:   See Encounters Tab for problem based charting.  Patient discussed with Dr. Lynnae January

## 2019-01-07 NOTE — Assessment & Plan Note (Addendum)
Checking lipid panel. 10 year ASCVD risk based on 2014 lipid panel was 6%.   ADDENDUM: Lipid panel with total cholesterol 220, LDL of 126. 10 year ASCVD risk is now 16.9%. Called patient with results, she is agreeable to starting statin therapy. Sent prescription for atorvastatin 40 mg daily to her pharmacy. Will repeat lipid panel in 3 months at follow up.

## 2019-01-07 NOTE — Assessment & Plan Note (Addendum)
Patient has persistent lower extremity swelling since her visit one month ago. She initially developed swelling in February. On exam she has bilateral non pitting edema (L>R) that is markedly different from prior exams. There is no erythema, tenderness, or skin changes. She notes that swelling improves with elevation and worsens throughout the day. Will check venous reflux ultrasound to look for insufficiency, CMP for albumin and liver function, and TSH.  -- F/u Venous Reflux ultrasound -- F/u CMP, TSH  -- Advised compression stockings, elevation   ADDENDUM: CMP and TSH within normal limits. Venous reflux ultrasound is scheduled for Tuesday 6/30.

## 2019-01-07 NOTE — Assessment & Plan Note (Signed)
Patient is post menopausal and has been experiencing hot flashes at night now for some time. She previously declined pharmacologic therapy, but symptoms have worsened and are now interfering with her sleep. We discussed risks and benefits of hormonal vs. Non hormonal therapies. She has no personal or family history of blood clots, no history of stroke, and no history of cancer. Her main CVD risk factor is her uncontrolled blood pressure. She is agreeable to starting with nonhormonal therapies. We will check lipid panel today to calculate 10 year ASCVD risk. She also has some bilateral lower extremity paresthesias from her new edema. Will start gabapentin QHS. If ineffective, consider SNRI /SSRI or estrogen therapy.  -- Start gabapentin 300 mg QHS -- Follow up 3 months

## 2019-01-08 ENCOUNTER — Other Ambulatory Visit (HOSPITAL_COMMUNITY): Payer: Self-pay | Admitting: Internal Medicine

## 2019-01-08 ENCOUNTER — Other Ambulatory Visit: Payer: Self-pay | Admitting: Internal Medicine

## 2019-01-08 DIAGNOSIS — R52 Pain, unspecified: Secondary | ICD-10-CM

## 2019-01-08 LAB — LIPID PANEL
Chol/HDL Ratio: 4.1 ratio (ref 0.0–4.4)
Cholesterol, Total: 220 mg/dL — ABNORMAL HIGH (ref 100–199)
HDL: 54 mg/dL (ref >39)
LDL Calculated: 126 mg/dL — ABNORMAL HIGH (ref 0–99)
Triglycerides: 199 mg/dL — ABNORMAL HIGH (ref 0–149)
VLDL Cholesterol Cal: 40 mg/dL (ref 5–40)

## 2019-01-08 LAB — CMP14 + ANION GAP
ALT: 21 IU/L (ref 0–32)
AST: 26 IU/L (ref 0–40)
Albumin/Globulin Ratio: 1.5 (ref 1.2–2.2)
Albumin: 4.1 g/dL (ref 3.8–4.8)
Alkaline Phosphatase: 96 IU/L (ref 39–117)
Anion Gap: 19 mmol/L — ABNORMAL HIGH (ref 10.0–18.0)
BUN/Creatinine Ratio: 16 (ref 12–28)
BUN: 15 mg/dL (ref 8–27)
Bilirubin Total: 0.2 mg/dL (ref 0.0–1.2)
CO2: 22 mmol/L (ref 20–29)
Calcium: 9.3 mg/dL (ref 8.7–10.3)
Chloride: 99 mmol/L (ref 96–106)
Creatinine, Ser: 0.91 mg/dL (ref 0.57–1.00)
GFR calc Af Amer: 76 mL/min/{1.73_m2} (ref >59)
GFR calc non Af Amer: 66 mL/min/{1.73_m2} (ref >59)
Globulin, Total: 2.8 g/dL (ref 1.5–4.5)
Glucose: 81 mg/dL (ref 65–99)
Potassium: 3.8 mmol/L (ref 3.5–5.2)
Sodium: 140 mmol/L (ref 134–144)
Total Protein: 6.9 g/dL (ref 6.0–8.5)

## 2019-01-08 LAB — TSH: TSH: 1.79 u[IU]/mL (ref 0.450–4.500)

## 2019-01-09 MED ORDER — ATORVASTATIN CALCIUM 40 MG PO TABS: 40.0000 mg | ORAL_TABLET | Freq: Every day | ORAL | 0 refills | Status: DC

## 2019-01-09 NOTE — Addendum Note (Signed)
Addended by: Jodean Lima on: 01/09/2019 12:17 PM   Modules accepted: Orders

## 2019-01-10 ENCOUNTER — Encounter (HOSPITAL_COMMUNITY): Payer: Medicare Other

## 2019-01-11 NOTE — Progress Notes (Signed)
Internal Medicine Clinic Attending  Case discussed with Dr. Guilloud at the time of the visit.  We reviewed the resident's history and exam and pertinent patient test results.  I agree with the assessment, diagnosis, and plan of care documented in the resident's note.  

## 2019-01-15 ENCOUNTER — Inpatient Hospital Stay (HOSPITAL_COMMUNITY): Admission: RE | Admit: 2019-01-15 | Payer: Medicare Other | Source: Ambulatory Visit

## 2019-01-17 ENCOUNTER — Telehealth (HOSPITAL_COMMUNITY): Payer: Self-pay | Admitting: Rehabilitation

## 2019-01-17 NOTE — Telephone Encounter (Signed)

## 2019-01-21 ENCOUNTER — Ambulatory Visit (HOSPITAL_COMMUNITY)
Admission: RE | Admit: 2019-01-21 | Discharge: 2019-01-21 | Disposition: A | Discharge status: Home / Self Care | Payer: Medicare Other | Source: Ambulatory Visit | Attending: Internal Medicine | Admitting: Internal Medicine

## 2019-01-21 ENCOUNTER — Other Ambulatory Visit: Payer: Self-pay

## 2019-01-21 DIAGNOSIS — R52 Pain, unspecified: Secondary | ICD-10-CM

## 2019-03-20 ENCOUNTER — Ambulatory Visit (INDEPENDENT_AMBULATORY_CARE_PROVIDER_SITE_OTHER): Payer: Medicare Other | Admitting: Internal Medicine

## 2019-03-20 ENCOUNTER — Encounter: Payer: Self-pay | Admitting: Internal Medicine

## 2019-03-20 ENCOUNTER — Other Ambulatory Visit: Payer: Self-pay

## 2019-03-20 ENCOUNTER — Other Ambulatory Visit: Payer: Self-pay | Admitting: Internal Medicine

## 2019-03-20 VITALS — BP 136/62 | HR 66 | Temp 98.2°F | Ht 66.0 in | Wt 166.3 lb

## 2019-03-20 DIAGNOSIS — Z87891 Personal history of nicotine dependence: Secondary | ICD-10-CM

## 2019-03-20 DIAGNOSIS — Z23 Encounter for immunization: Secondary | ICD-10-CM

## 2019-03-20 DIAGNOSIS — R600 Localized edema: Secondary | ICD-10-CM

## 2019-03-20 DIAGNOSIS — E785 Hyperlipidemia, unspecified: Secondary | ICD-10-CM

## 2019-03-20 DIAGNOSIS — R232 Flushing: Secondary | ICD-10-CM

## 2019-03-20 DIAGNOSIS — Z79899 Other long term (current) drug therapy: Secondary | ICD-10-CM

## 2019-03-20 DIAGNOSIS — I10 Essential (primary) hypertension: Secondary | ICD-10-CM

## 2019-03-20 MED ORDER — FUROSEMIDE 20 MG PO TABS: 20.0000 mg | ORAL_TABLET | ORAL | 1 refills | Status: DC | PRN

## 2019-03-20 MED ORDER — GABAPENTIN 300 MG PO CAPS: 300.0000 mg | ORAL_CAPSULE | Freq: Every day | ORAL | 3 refills | Status: DC

## 2019-03-20 NOTE — Progress Notes (Signed)
Subjective:   Patient ID: Katherine Haynes female   DOB: 1951-01-13 68 y.o.   MRN: [id number]  HPI: KatherineDidi D Haynes is a 68 y.o. female with past medical history outlined below here for HTN follow up. For the details of today's visit, please refer to the assessment and plan.   Past Medical History:  Diagnosis Date  . Hyperlipidemia   . Hypertension   . Menopause syndrome    Current Outpatient Medications  Medication Sig Dispense Refill  . amLODipine (NORVASC) 10 MG tablet Take 1 tablet (10 mg total) by mouth daily. 90 tablet 3  . atorvastatin (LIPITOR) 40 MG tablet Take 1 tablet (40 mg total) by mouth daily. 90 tablet 0  . diclofenac sodium (VOLTAREN) 1 % GEL Apply 2 g topically 4 (four) times daily as needed. 100 g 1  . gabapentin (NEURONTIN) 300 MG capsule Take 1 capsule (300 mg total) by mouth at bedtime. 30 capsule 2  . hydrOXYzine (ATARAX/VISTARIL) 50 MG tablet TAKE 1 TABLET(50 MG) BY MOUTH THREE TIMES DAILY AS NEEDED FOR ITCHING 90 tablet 0  . ibuprofen (ADVIL,MOTRIN) 600 MG tablet Take 1 tablet (600 mg total) by mouth every 8 (eight) hours as needed for headache. 12 tablet 0  . metoprolol tartrate (LOPRESSOR) 25 MG tablet Take 1 tablet (25 mg total) by mouth 2 (two) times daily. 60 tablet 2  . naproxen (NAPROSYN) 500 MG tablet Take 1 tablet (500 mg total) by mouth 2 (two) times daily with a meal. 60 tablet 1  . traZODone (DESYREL) 50 MG tablet Take 0.5-1 tablets (25-50 mg total) by mouth at bedtime as needed for sleep. 30 tablet 0   No current facility-administered medications for this visit.    Family History  Problem Relation Age of Onset  . Heart disease Mother        "enlarged heart"  . Hypertension Mother   . Diabetes Mother   . Heart disease Maternal Uncle        Bypass surg  . Heart attack Maternal Grandmother        also had irregular heart beat requriring a pacemaker.  . Hypertension Other        In multiple relatives.   . Hypertension Paternal Grandmother    . COPD Sister   . Sarcoidosis Sister   . Hypertension Brother   . Hypertension Sister   . Arthritis Sister   . COPD Sister   . Diabetes Sister   . Hypertension Sister   . Arthritis Sister   . Hypertension Brother   . Breast cancer Neg Hx    Social History   Socioeconomic History  . Marital status: Married    Spouse name: Not on file  . Number of children: Not on file  . Years of education: Not on file  . Highest education level: Not on file  Occupational History  . Not on file  Social Needs  . Financial resource strain: Not on file  . Food insecurity    Worry: Not on file    Inability: Not on file  . Transportation needs    Medical: Not on file    Non-medical: Not on file  Tobacco Use  . Smoking status: Former Games developer  . Smokeless tobacco: Never Used  . Tobacco comment:  1 PPD for 2-3 years. Quit ~0865.  Substance and Sexual Activity  . Alcohol use: No  . Drug use: No  . Sexual activity: Yes    Birth control/protection: Surgical  Lifestyle  .  Physical activity    Days per week: 0 days    Minutes per session: 0 min  . Stress: Not at all  Relationships  . Social connections    Talks on phone: More than three times a week    Gets together: More than three times a week    Attends religious service: More than 4 times per year    Active member of club or organization: No    Attends meetings of clubs or organizations: Never    Relationship status: Married  Other Topics Concern  . Not on file  Social History Narrative   Lives with husband. Good relationship, feels safe at home. Studying medical administration.       Current Social History 09/27/2018        Patient lives with spouse in an apartment on the second floor. There are 14 steps with handrails up to the entrance the patient uses.       Patient's method of transportation is personal car.      The highest level of education was high school diploma.      The patient currently is employed as a Corporate treasurer in patient's homes.      Identified important Relationships are "My husband, my kids, my grands and great grands."       Pets : None       Interests / Fun: Watching TV, going to beach and park, cooking out with family.       Current Stressors: "finances, teenagers" (grandkids)       Religious / Personal Beliefs: Methodist, "I was raised to treat people the way you want to be treated.          Review of Systems: Respiratory: negative for cough and dyspnea on exertion Cardiovascular: negative for chest pain, dyspnea, orthopnea and paroxysmal nocturnal dyspnea   Objective:  Physical Exam: Vitals:   03/20/19 1026  BP: 136/62  Pulse: 66  Temp: 98.2 F (36.8 C)  TempSrc: Oral  SpO2: 100%  Weight: 166 lb 4.8 oz (75.4 kg)  Height: 5\' 6"  (1.676 m)   Constitutional: NAD, appears comfortable HEENT: Atraumatic, normocephalic. Normal oropharynx.  Neck: Supple, trachea midline.  Cardiovascular: RRR, no murmurs, rubs, or gallops.  Pulmonary/Chest: CTAB, no wheezes, rales, or rhonchi.  Extremities: Warm and well perfused. Bilateral nonpitting edema (L>R) to the mid shins bilaterally with demarcation around her ankles from her tennis shoes and socks.  Psychiatric: Normal mood and affect   Assessment & Plan:

## 2019-03-20 NOTE — Assessment & Plan Note (Signed)
Blood pressure today is 136/62, near goal. She is compliant with amlodipine 10 mg daily and metoprolol 25 mg BID.  -- Continue current regimen  -- Lasix 20 mg daily PRN added for lower extremity swelling. Should not have significant effect on blood pressure however she is intolerant of thiazide diuretics due to orthostatic hypotension. Will monitor for adverse effects.

## 2019-03-20 NOTE — Assessment & Plan Note (Addendum)
Patient was started on atorvastatin 40 mg daily at her last visit for primary prevention with 10 year ASCVD risk of 16.9%. She has tolerated atorvastatin well without side effects.  -- Repeat lipid panel today   ADDENDUM: LDL at goal. Continue atorvastatin 40.

## 2019-03-20 NOTE — Assessment & Plan Note (Signed)
Patient reports significant improvement in her hot flashes with QHS gabapentin. Sleep has improved. She would like to continue current treatment.  -- Refilled gabapentin 300 mg QHS

## 2019-03-20 NOTE — Patient Instructions (Signed)
Ms. Frederking,  It was a pleasure to see you today. Please continue to take your medicine as previously prescribed.  For your swelling, you may take lasix 20 mg once a day as needed. If you develop dizziness, please stop the medicine and give me a call. I have also ordered an ultrasound of your heart. You will be called to schedule this.   Follow up with me again in 3 months, or sooner if you have any issues. If you have any questions or concerns, call our clinic at [phone] or after hours call [phone] and ask for the internal medicine resident on call. Thank you!  Dr. Philipp Ovens

## 2019-03-20 NOTE — Assessment & Plan Note (Signed)
Patient continues to have lower extremity swelling since her last visit. Because etiology was unclear, venous reflux ultrasounds was done on 01/21/2019 that showed no evidence of deep or superficial chronic venous insufficiency on the right, and abnormal reflux times in the proximal small saphenous vein on the left but otherwise normal deep veins. I am not convinced this one abnormal superficial vein is pathologic given the degree of swelling and bilateral involvement. She has been using her compression stockings at home which provide some relief. Labs obtained last visit showed normal renal and liver function and normal albumin. She denies chest pain and symptoms of heart failure (orthopnea, PND). Aside from her lower extremity swelling she does not appear to be in decompensated heart failure. Although on chart review, she has gained 15 lbs since January. She reports a remote history of similar swelling many years ago that resolved with lasix. She has a history of dehydration and orthostatic hypotension with thiazide diuretic use. Will start a trial of low dose prn lasix, but warned patient of possible similar side effects and to discontinue to medication if she develops dizziness. Will obtain echocardiogram for further work up.  -- Lasix 20 mg PRN for swelling -- F/u Echocardiogram  -- Continue compression stockings

## 2019-03-21 LAB — LIPID PANEL
Chol/HDL Ratio: 2.4 ratio (ref 0.0–4.4)
Cholesterol, Total: 116 mg/dL (ref 100–199)
HDL: 49 mg/dL (ref >39)
LDL Chol Calc (NIH): 46 mg/dL (ref 0–99)
Triglycerides: 120 mg/dL (ref 0–149)
VLDL Cholesterol Cal: 21 mg/dL (ref 5–40)

## 2019-04-01 ENCOUNTER — Telehealth: Payer: Self-pay | Admitting: Internal Medicine

## 2019-04-01 DIAGNOSIS — I10 Essential (primary) hypertension: Secondary | ICD-10-CM

## 2019-04-01 MED ORDER — METOPROLOL TARTRATE 25 MG PO TABS: 25.0000 mg | ORAL_TABLET | Freq: Two times a day (BID) | ORAL | 5 refills | Status: DC

## 2019-04-01 NOTE — Telephone Encounter (Signed)
Needs refill on metoprolol tartrate (LOPRESSOR) 25 MG tablet  pt contact [id number] - [place], [place] - [address] [place]

## 2019-04-08 ENCOUNTER — Other Ambulatory Visit: Payer: Self-pay | Admitting: Internal Medicine

## 2019-04-08 ENCOUNTER — Encounter: Payer: Self-pay | Admitting: Internal Medicine

## 2019-04-08 DIAGNOSIS — R600 Localized edema: Secondary | ICD-10-CM

## 2019-04-08 MED ORDER — FUROSEMIDE 20 MG PO TABS: 20.0000 mg | ORAL_TABLET | Freq: Two times a day (BID) | ORAL | 1 refills | Status: DC

## 2019-04-08 NOTE — Telephone Encounter (Signed)
Patient called saying that she increased her lasix from 1 tablet daily to BID. This was prescribed at a prior visit for lower extremity swelling. She reported no improvement with 20 mg once daily but has noticed significant improvement with the BID dosing. Advised patient not to change medication in the future without calling first. Ok to continue BID dosing for now. Will send new prescription to her pharmacy. She is scheduled for echocardiogram. Follow up in person 2-4 weeks for repeat BMP.

## 2019-04-17 ENCOUNTER — Ambulatory Visit (HOSPITAL_COMMUNITY): Payer: Medicare Other

## 2019-04-19 ENCOUNTER — Ambulatory Visit (HOSPITAL_COMMUNITY)
Admission: RE | Admit: 2019-04-19 | Discharge: 2019-04-19 | Disposition: A | Discharge status: Home / Self Care | Payer: Medicare Other | Source: Ambulatory Visit | Attending: Internal Medicine | Admitting: Internal Medicine

## 2019-04-19 ENCOUNTER — Other Ambulatory Visit: Payer: Self-pay | Admitting: Internal Medicine

## 2019-04-19 ENCOUNTER — Other Ambulatory Visit: Payer: Self-pay

## 2019-04-19 DIAGNOSIS — R600 Localized edema: Secondary | ICD-10-CM

## 2019-04-19 DIAGNOSIS — Z8249 Family history of ischemic heart disease and other diseases of the circulatory system: Secondary | ICD-10-CM | POA: Diagnosis not present

## 2019-04-19 DIAGNOSIS — I340 Nonrheumatic mitral (valve) insufficiency: Secondary | ICD-10-CM | POA: Diagnosis not present

## 2019-04-19 HISTORY — PX: TRANSTHORACIC ECHOCARDIOGRAM: SHX275

## 2019-04-19 NOTE — Progress Notes (Signed)
  Echocardiogram 2D Echocardiogram has been performed.  Darlina Sicilian M 04/19/2019, 10:13 AM

## 2019-04-22 ENCOUNTER — Other Ambulatory Visit: Payer: Self-pay

## 2019-04-22 ENCOUNTER — Ambulatory Visit (INDEPENDENT_AMBULATORY_CARE_PROVIDER_SITE_OTHER): Payer: Medicare Other | Admitting: Internal Medicine

## 2019-04-22 VITALS — BP 111/63 | HR 73 | Temp 98.2°F | Ht 66.0 in | Wt 163.9 lb

## 2019-04-22 DIAGNOSIS — Z79899 Other long term (current) drug therapy: Secondary | ICD-10-CM

## 2019-04-22 DIAGNOSIS — R600 Localized edema: Secondary | ICD-10-CM | POA: Diagnosis not present

## 2019-04-22 NOTE — Patient Instructions (Addendum)
Thank you for allowing [place] to provide your care. You can continue to use the compression stockings and furosemide (Lasix) as needed. If you begin to experience any dizziness or lightheadedness with standing please call us immediately.   Please schedule a follow-up with Dr. Philipp Ovens in 3 months or sooner if any issues arise.

## 2019-04-22 NOTE — Progress Notes (Signed)
   CC: LE edema  HPI:  [name] is a 68 y.o. female with PMHx listed below presenting for LE edema. Please see the A&P for the status of the patient's chronic medical problems.  Past Medical History:  Diagnosis Date  . Hyperlipidemia   . Hypertension   . Menopause syndrome    Review of Systems:  Performed and all others negative.  Physical Exam: Vitals:   04/22/19 0919  BP: 111/63  Pulse: 73  Temp: 98.2 F (36.8 C)  TempSrc: Oral  SpO2: 100%  Weight: 163 lb 14.4 oz (74.3 kg)  Height: 5\' 6"  (1.676 m)   General: Well nourished female in no acute distress CV: RRR, no murmurs, no rubs  Extremities: Trace nonpitting LE edema  Assessment & Plan:   See Encounters Tab for problem based charting.  Patient discussed with Dr. Dareen Piano

## 2019-04-22 NOTE — Assessment & Plan Note (Addendum)
HPI:  Patient seen at the beginning of September for bilateral lower extremity edema. Workup at the time illustrated no etiology. Hepatic function normal. No evidence of nephrotic syndrome. Echocardiogram was obtained that illustrated some grade 1 diastolic dysfunction but no elevated filling pressures to suggest a diagnosis of heart failure.   She was started on Lasix and compression stockings. Since starting these medication she has noticed a significant reduction in her lower extremity swelling. She denies orthostatic symptoms. Today she is mild non-pitting edema around her ankles. She is unable to tell me whether her edema is usually pitting or not pitting. She is not had any pelvic surgeries or been told she has uterine fibroids. She is no inguinal lymph nodes on physical exam.  A/P: - Continue PRN lasix and compression stockings  - Patient will call if she develops orthostatic symptoms  - Check TSH

## 2019-04-23 NOTE — Progress Notes (Signed)
Internal Medicine Clinic Attending  Case discussed with Dr. Helberg at the time of the visit.  We reviewed the resident's history and exam and pertinent patient test results.  I agree with the assessment, diagnosis, and plan of care documented in the resident's note.    

## 2019-04-24 ENCOUNTER — Other Ambulatory Visit: Payer: Self-pay

## 2019-04-24 DIAGNOSIS — E785 Hyperlipidemia, unspecified: Secondary | ICD-10-CM

## 2019-04-24 MED ORDER — ATORVASTATIN CALCIUM 40 MG PO TABS: 40.0000 mg | ORAL_TABLET | Freq: Every day | ORAL | 1 refills | Status: DC

## 2019-04-24 NOTE — Telephone Encounter (Signed)
atorvastatin (LIPITOR) 40 MG tablet, REFILL REQUEST @  [facility] [id number] - [place], [place] - [place] [phone] (Phone) [phone] (Fax)

## 2019-05-16 ENCOUNTER — Other Ambulatory Visit: Payer: Self-pay | Admitting: Internal Medicine

## 2019-05-16 ENCOUNTER — Other Ambulatory Visit: Payer: Self-pay | Admitting: *Deleted

## 2019-05-16 DIAGNOSIS — G43701 Chronic migraine without aura, not intractable, with status migrainosus: Secondary | ICD-10-CM

## 2019-05-16 DIAGNOSIS — I10 Essential (primary) hypertension: Secondary | ICD-10-CM

## 2019-05-16 MED ORDER — IBUPROFEN 600 MG PO TABS: 600.0000 mg | ORAL_TABLET | Freq: Three times a day (TID) | ORAL | 0 refills | Status: DC | PRN

## 2019-06-12 ENCOUNTER — Other Ambulatory Visit: Payer: Self-pay | Admitting: *Deleted

## 2019-06-12 DIAGNOSIS — R600 Localized edema: Secondary | ICD-10-CM

## 2019-06-12 MED ORDER — FUROSEMIDE 20 MG PO TABS: 20.0000 mg | ORAL_TABLET | Freq: Two times a day (BID) | ORAL | 0 refills | Status: DC | PRN

## 2019-06-12 NOTE — Telephone Encounter (Signed)
Next appt scheduled 07/22/18 with PCP.

## 2019-07-12 ENCOUNTER — Other Ambulatory Visit: Payer: Self-pay | Admitting: Internal Medicine

## 2019-07-12 DIAGNOSIS — I10 Essential (primary) hypertension: Secondary | ICD-10-CM

## 2019-07-15 NOTE — Telephone Encounter (Signed)
Next appt scheduled 1/6 21 with PCP.

## 2019-07-24 ENCOUNTER — Other Ambulatory Visit: Payer: Self-pay

## 2019-07-24 ENCOUNTER — Ambulatory Visit (INDEPENDENT_AMBULATORY_CARE_PROVIDER_SITE_OTHER): Payer: Medicare Other | Admitting: Internal Medicine

## 2019-07-24 ENCOUNTER — Encounter: Payer: Self-pay | Admitting: Internal Medicine

## 2019-07-24 VITALS — BP 130/70 | HR 70 | Temp 97.7°F | Ht 66.0 in | Wt 163.0 lb

## 2019-07-24 DIAGNOSIS — R600 Localized edema: Secondary | ICD-10-CM

## 2019-07-24 DIAGNOSIS — I10 Essential (primary) hypertension: Secondary | ICD-10-CM

## 2019-07-24 DIAGNOSIS — G4700 Insomnia, unspecified: Secondary | ICD-10-CM

## 2019-07-24 DIAGNOSIS — R232 Flushing: Secondary | ICD-10-CM

## 2019-07-24 DIAGNOSIS — Z87891 Personal history of nicotine dependence: Secondary | ICD-10-CM | POA: Diagnosis not present

## 2019-07-24 DIAGNOSIS — F5101 Primary insomnia: Secondary | ICD-10-CM

## 2019-07-24 DIAGNOSIS — Z79899 Other long term (current) drug therapy: Secondary | ICD-10-CM

## 2019-07-24 NOTE — Assessment & Plan Note (Addendum)
Patient is here for follow up of idiopathic lower extremity edema. She is currently taking lasix 20 mg BID and feels like it is helping. She has a history of orthostatic hypotension on a thiazide diuretic but denies any dizziness while on lasix. On my exam she still has significant bilateral mostly non pitting lower extremity edema. Instructed patient to try taking both tablets (40 mg) once daily in the morning to see if she gets a better response. It is possible we have not hit her threshold dose. She knows to call or revert to 20 mg BID if she develops dizziness. Follow up BMP.  ADDENDUM: BMP normal. Continue current plan.

## 2019-07-24 NOTE — Assessment & Plan Note (Signed)
Well controlled on Gabapentin 300 mg QHS.

## 2019-07-24 NOTE — Assessment & Plan Note (Signed)
No longer taking trazodone since she starting taking Gabapentin for hot flashes. This makes her drowsy enough to sleep.  -- D/c trazodone. Can restart as needed.

## 2019-07-24 NOTE — Assessment & Plan Note (Signed)
BP at goal. 130/70 today. Continue current regimen amlodipine 10 mg and metoprolol 25 mg BID.

## 2019-07-24 NOTE — Progress Notes (Signed)
Subjective:   Patient ID: Katherine Haynes female   DOB: 02-15-1951 69 y.o.   MRN: [id number]  HPI: [name] is a 69 y.o. female with past medical history outlined below here for HTN and lower extremity edema follow up. For the details of today's visit, please refer to the assessment and plan.   Past Medical History:  Diagnosis Date  . Hyperlipidemia   . Hypertension   . Menopause syndrome    Current Outpatient Medications  Medication Sig Dispense Refill  . amLODipine (NORVASC) 10 MG tablet TAKE 1 TABLET(10 MG) BY MOUTH DAILY 90 tablet 3  . atorvastatin (LIPITOR) 40 MG tablet Take 1 tablet (40 mg total) by mouth daily. 90 tablet 1  . diclofenac sodium (VOLTAREN) 1 % GEL Apply 2 g topically 4 (four) times daily as needed. 100 g 1  . furosemide (LASIX) 20 MG tablet Take 1 tablet (20 mg total) by mouth 2 (two) times daily as needed for fluid or edema. 180 tablet 0  . gabapentin (NEURONTIN) 300 MG capsule Take 1 capsule (300 mg total) by mouth at bedtime. 90 capsule 3  . hydrOXYzine (ATARAX/VISTARIL) 50 MG tablet TAKE 1 TABLET(50 MG) BY MOUTH THREE TIMES DAILY AS NEEDED FOR ITCHING 90 tablet 0  . ibuprofen (ADVIL) 600 MG tablet Take 1 tablet (600 mg total) by mouth every 8 (eight) hours as needed for headache. 12 tablet 0  . metoprolol tartrate (LOPRESSOR) 25 MG tablet Take 1 tablet (25 mg total) by mouth 2 (two) times daily. 60 tablet 5  . naproxen (NAPROSYN) 500 MG tablet Take 1 tablet (500 mg total) by mouth 2 (two) times daily with a meal. 60 tablet 1  . traZODone (DESYREL) 50 MG tablet Take 0.5-1 tablets (25-50 mg total) by mouth at bedtime as needed for sleep. 30 tablet 0   No current facility-administered medications for this visit.   Family History  Problem Relation Age of Onset  . Heart disease Mother        "enlarged heart"  . Hypertension Mother   . Diabetes Mother   . Heart disease Maternal Uncle        Bypass surg  . Heart attack Maternal Grandmother    also had irregular heart beat requriring a pacemaker.  . Hypertension Other        In multiple relatives.   . Hypertension Paternal Grandmother   . COPD Sister   . Sarcoidosis Sister   . Hypertension Brother   . Hypertension Sister   . Arthritis Sister   . COPD Sister   . Diabetes Sister   . Hypertension Sister   . Arthritis Sister   . Hypertension Brother   . Breast cancer Neg Hx    Social History   Socioeconomic History  . Marital status: Married    Spouse name: Not on file  . Number of children: Not on file  . Years of education: Not on file  . Highest education level: Not on file  Occupational History  . Not on file  Tobacco Use  . Smoking status: Former Games developer  . Smokeless tobacco: Never Used  . Tobacco comment:  1 PPD for 2-3 years. Quit ~4098.  Substance and Sexual Activity  . Alcohol use: No  . Drug use: No  . Sexual activity: Yes    Birth control/protection: Surgical  Other Topics Concern  . Not on file  Social History Narrative   Lives with husband. Good relationship, feels safe at home. Studying medical administration.  Current Social History 09/27/2018        Patient lives with spouse in an apartment on the second floor. There are 14 steps with handrails up to the entrance the patient uses.       Patient's method of transportation is personal car.      The highest level of education was high school diploma.      The patient currently is employed as a Engineer, site in patient's homes.      Identified important Relationships are "My husband, my kids, my grands and great grands."       Pets : None       Interests / Fun: Watching TV, going to beach and park, cooking out with family.       Current Stressors: "finances, teenagers" (grandkids)       Religious / Personal Beliefs: Methodist, "I was raised to treat people the way you want to be treated.          Social Determinants of Health   Financial Resource Strain:   . Difficulty of Paying  Living Expenses: Not on file  Food Insecurity:   . Worried About Programme researcher, broadcasting/film/video in the Last Year: Not on file  . Ran Out of Food in the Last Year: Not on file  Transportation Needs:   . Lack of Transportation (Medical): Not on file  . Lack of Transportation (Non-Medical): Not on file  Physical Activity:   . Days of Exercise per Week: Not on file  . Minutes of Exercise per Session: Not on file  Stress:   . Feeling of Stress : Not on file  Social Connections:   . Frequency of Communication with Friends and Family: Not on file  . Frequency of Social Gatherings with Friends and Family: Not on file  . Attends Religious Services: Not on file  . Active Member of Clubs or Organizations: Not on file  . Attends Banker Meetings: Not on file  . Marital Status: Not on file    Review of Systems: Review of Systems  Respiratory: Negative for shortness of breath.   Cardiovascular: Negative for chest pain.  Neurological: Negative for dizziness.     Objective:  Physical Exam:  Vitals:   07/24/19 1020  BP: 130/70  Pulse: 70  Temp: 97.7 F (36.5 C)  TempSrc: Oral  SpO2: 99%  Weight: 163 lb (73.9 kg)  Height: 5\' 6"  (1.676 m)    Physical Exam  Constitutional: She is well-developed, well-nourished, and in no distress. No distress.  Cardiovascular: Normal rate, regular rhythm and normal heart sounds.  No murmur heard. Pulmonary/Chest: Effort normal and breath sounds normal. No respiratory distress. She has no wheezes.  Musculoskeletal:        General: Edema present.  Skin: She is not diaphoretic.     Assessment & Plan:   See Encounters Tab for problem based charting.

## 2019-07-24 NOTE — Patient Instructions (Signed)
[name],  It was a pleasure to see you today. I am glad to hear you are doing well! You are doing a great job with your blood pressure, continue to take all your medicine as previously prescribed.  If your blood work shows anything abnormal I will give you a call. Follow up with me again in 6 months. If you have any questions or concerns, call our clinic at [phone] or after hours call [phone] and ask for the internal medicine resident on call.   Thank you!  Dr. Philipp Ovens

## 2019-07-25 LAB — BMP8+ANION GAP
Anion Gap: 14 mmol/L (ref 10.0–18.0)
BUN/Creatinine Ratio: 9 — ABNORMAL LOW (ref 12–28)
BUN: 9 mg/dL (ref 8–27)
CO2: 27 mmol/L (ref 20–29)
Calcium: 9.8 mg/dL (ref 8.7–10.3)
Chloride: 100 mmol/L (ref 96–106)
Creatinine, Ser: 0.97 mg/dL (ref 0.57–1.00)
GFR calc Af Amer: 70 mL/min/{1.73_m2} (ref >59)
GFR calc non Af Amer: 61 mL/min/{1.73_m2} (ref >59)
Glucose: 78 mg/dL (ref 65–99)
Potassium: 4.5 mmol/L (ref 3.5–5.2)
Sodium: 141 mmol/L (ref 134–144)

## 2019-09-04 ENCOUNTER — Encounter: Payer: Medicare Other | Admitting: Internal Medicine

## 2019-09-04 ENCOUNTER — Encounter: Payer: Self-pay | Admitting: Internal Medicine

## 2019-09-07 ENCOUNTER — Other Ambulatory Visit: Payer: Self-pay

## 2019-09-07 ENCOUNTER — Ambulatory Visit: Payer: Medicare Other | Attending: Internal Medicine

## 2019-09-07 DIAGNOSIS — Z23 Encounter for immunization: Secondary | ICD-10-CM | POA: Insufficient documentation

## 2019-09-07 NOTE — Progress Notes (Signed)
Covid-19 Vaccination Clinic  Name:  Katherine Haynes    MRN: [id number] DOB: May 28, 1951  09/07/2019  [name] was observed post Covid-19 immunization for 15 minutes without incidence. She was provided with Vaccine Information Sheet and instruction to access the V-Safe system.   [name] was instructed to call 911 with any severe reactions post vaccine: Marland Kitchen Difficulty breathing  . Swelling of your face and throat  . A fast heartbeat  . A bad rash all over your body  . Dizziness and weakness    Immunizations Administered    Name Date Dose VIS Date Route   Pfizer COVID-19 Vaccine 09/07/2019  8:35 AM 0.3 mL 06/28/2019 Intramuscular   Manufacturer: [employer], [employer]   Lot: [id number]   NDC: [address]-6

## 2019-10-01 ENCOUNTER — Ambulatory Visit: Payer: Medicare Other | Attending: Internal Medicine

## 2019-10-01 DIAGNOSIS — Z23 Encounter for immunization: Secondary | ICD-10-CM

## 2019-10-01 NOTE — Progress Notes (Signed)
Covid-19 Vaccination Clinic  Name:  Katherine Haynes    MRN: [id number] DOB: 10/12/1950  10/01/2019  [name] was observed post Covid-19 immunization for 15 minutes without incident. She was provided with Vaccine Information Sheet and instruction to access the V-Safe system.   [name] was instructed to call 911 with any severe reactions post vaccine: Marland Kitchen Difficulty breathing  . Swelling of face and throat  . A fast heartbeat  . A bad rash all over body  . Dizziness and weakness   Immunizations Administered    Name Date Dose VIS Date Route   Pfizer COVID-19 Vaccine 10/01/2019  8:58 AM 0.3 mL 06/28/2019 Intramuscular   Manufacturer: [employer], [employer]   Lot: [id number]   NDC: [address]-4

## 2019-10-16 ENCOUNTER — Other Ambulatory Visit: Payer: Self-pay | Admitting: Internal Medicine

## 2019-10-16 DIAGNOSIS — I10 Essential (primary) hypertension: Secondary | ICD-10-CM

## 2019-10-31 ENCOUNTER — Other Ambulatory Visit: Payer: Self-pay | Admitting: Internal Medicine

## 2019-10-31 DIAGNOSIS — Z1231 Encounter for screening mammogram for malignant neoplasm of breast: Secondary | ICD-10-CM

## 2019-10-31 DIAGNOSIS — I10 Essential (primary) hypertension: Secondary | ICD-10-CM

## 2019-11-04 ENCOUNTER — Telehealth: Payer: Self-pay | Admitting: Internal Medicine

## 2019-11-04 NOTE — Telephone Encounter (Signed)
Need refill on metoprolol tartrate (LOPRESSOR) 25 MG tablet  ;pt contact Katherine Haynes - [place], [place] [facility] [place]

## 2019-11-04 NOTE — Telephone Encounter (Signed)
Metoprolol #60 with 5 refills sent to [facility] 10/16/2019. Spoke with Linna Hoff who stated they did not receive this Rx. Called to [facility] and he will get ready for patient. Hubbard Hartshorn, BSN, RN-BC

## 2019-11-14 ENCOUNTER — Ambulatory Visit
Admission: RE | Admit: 2019-11-14 | Discharge: 2019-11-14 | Disposition: A | Discharge status: Home / Self Care | Payer: Medicare Other | Source: Ambulatory Visit | Attending: Internal Medicine | Admitting: Internal Medicine

## 2019-11-14 DIAGNOSIS — Z1231 Encounter for screening mammogram for malignant neoplasm of breast: Secondary | ICD-10-CM

## 2019-11-14 IMAGING — MG DIGITAL SCREENING BILAT W/ TOMO W/ CAD
8 series · 9 of 24 positions shown · non-contrast
Comparison: Previous exam(s).

CLINICAL DATA: Screening.

EXAM:
DIGITAL SCREENING BILATERAL MAMMOGRAM WITH TOMO AND CAD

[L MLO synth-2D]
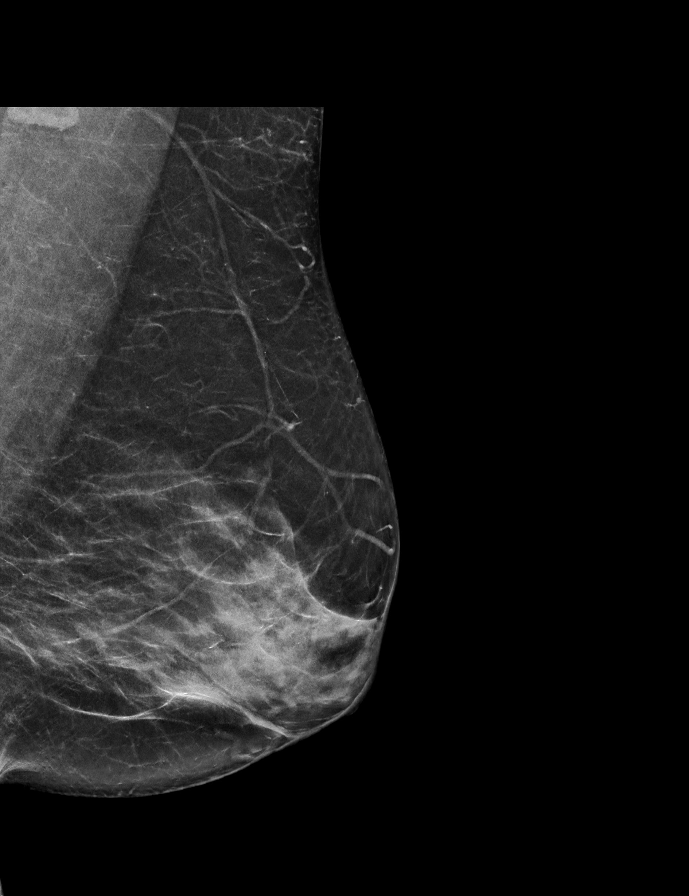

[L CC synth-2D]
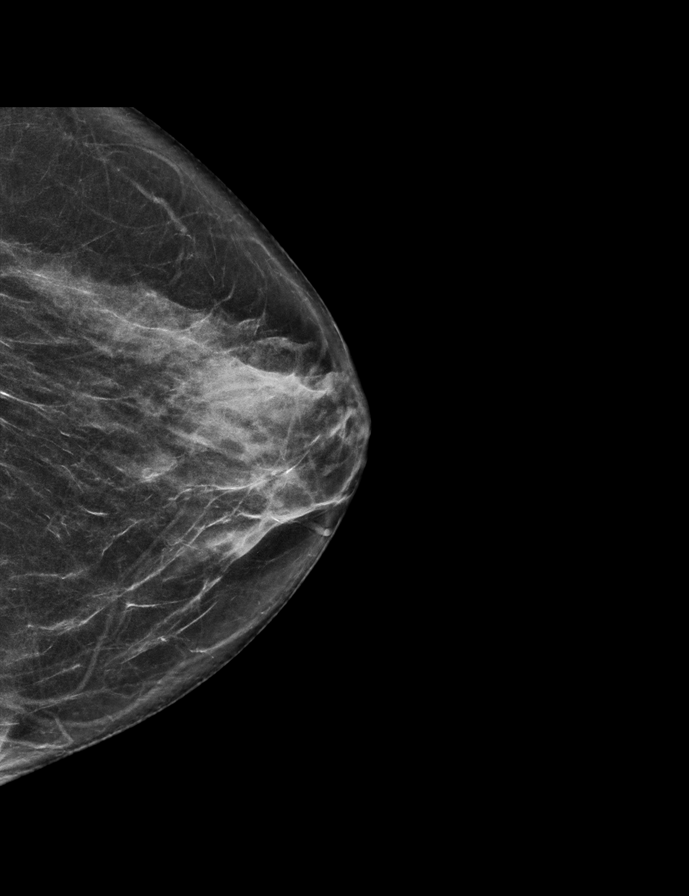

[R CC synth-2D]
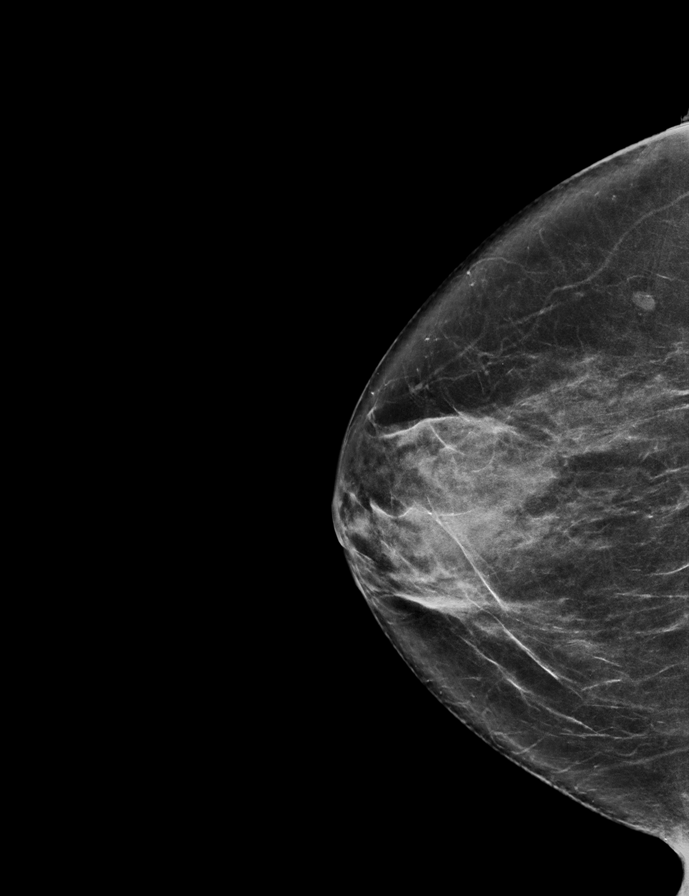

[R MLO synth-2D]
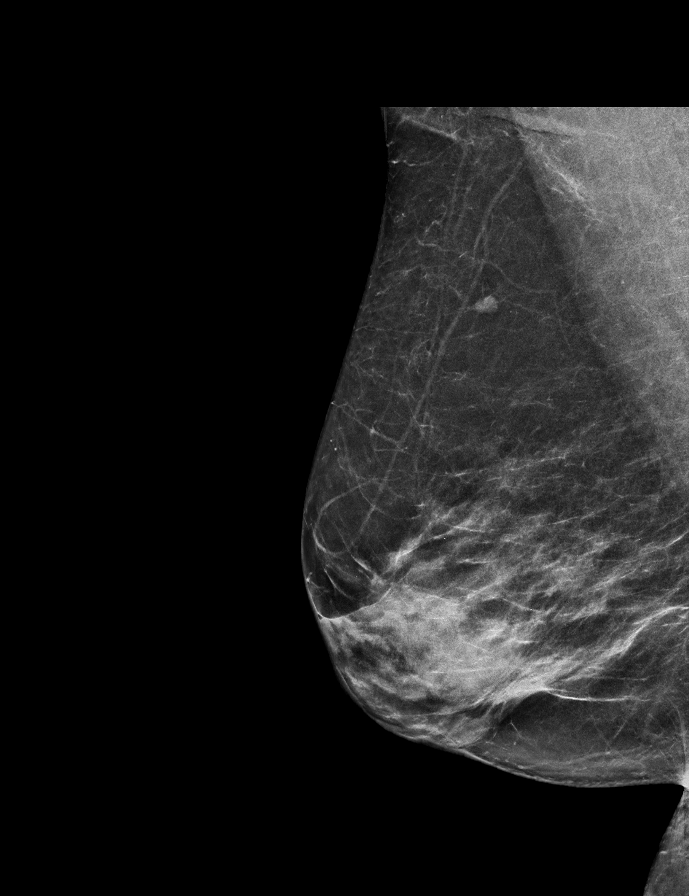

[L MLO tomo · 2 of 68 frames shown]
[frame 22/68]
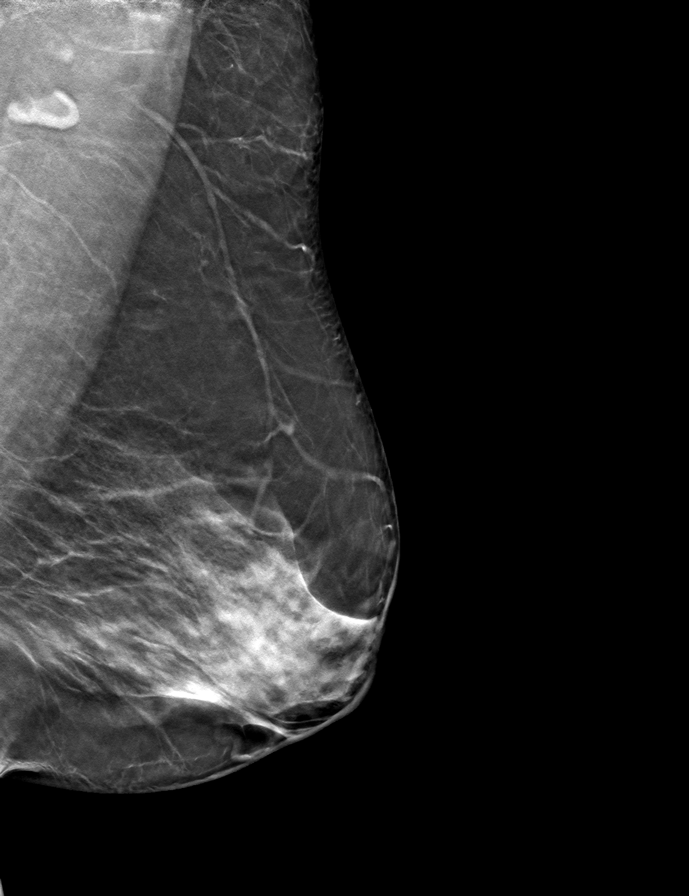
[frame 35/68]
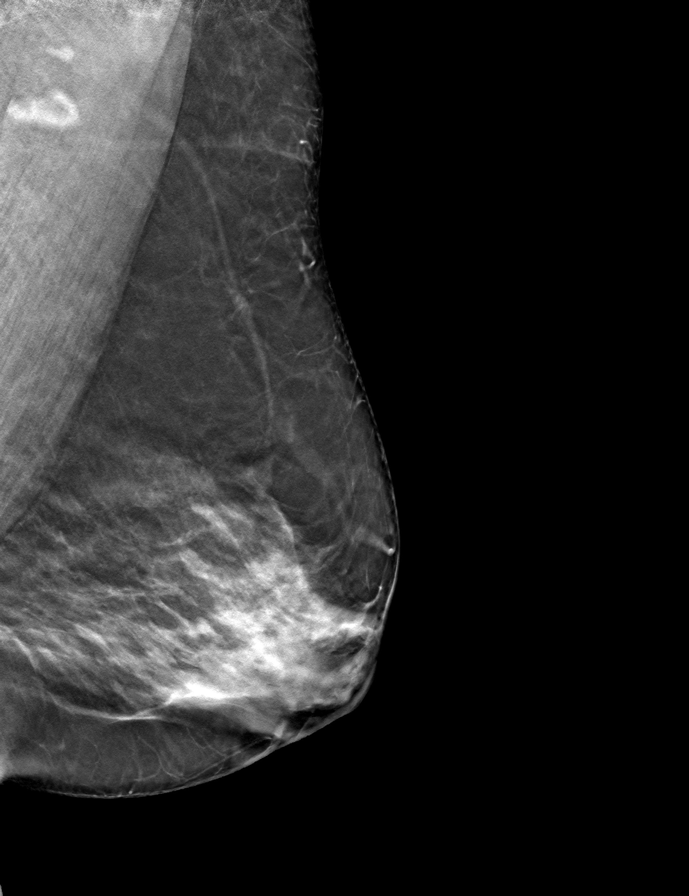

[R MLO tomo · tomo slice 33/66.0]
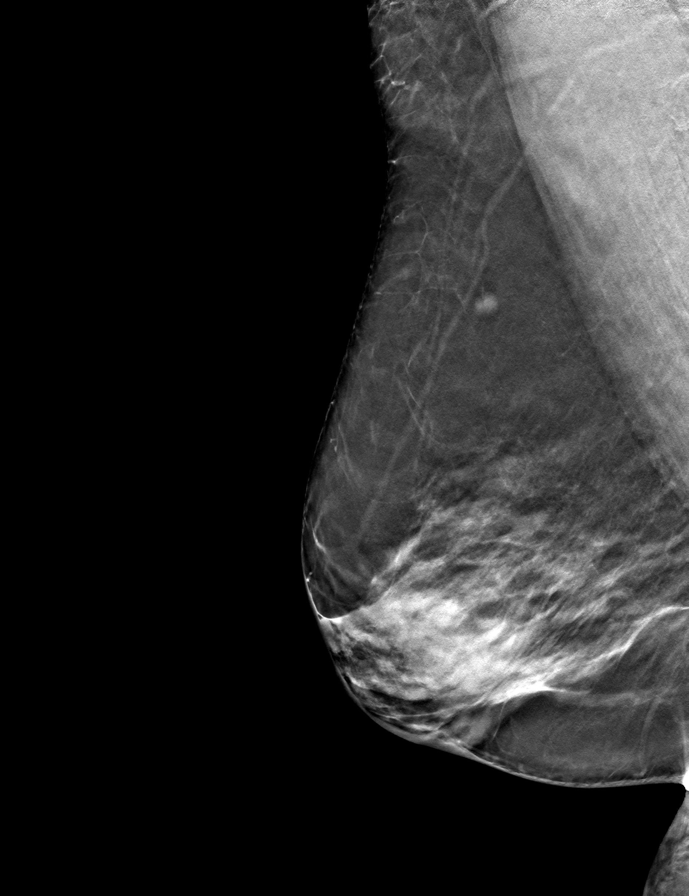

[R CC tomo · tomo slice 35/69.0]
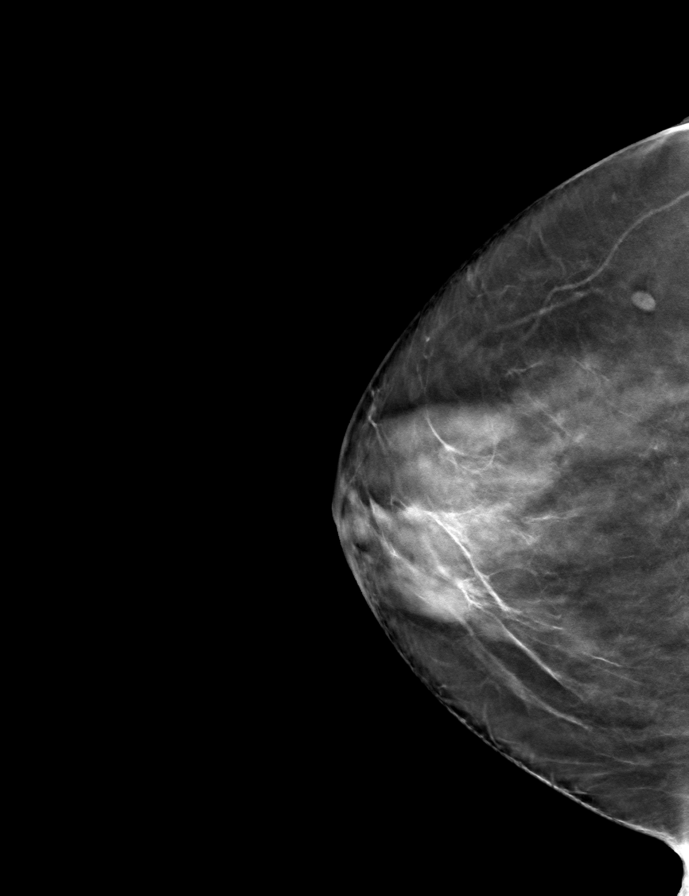

[L CC tomo · tomo slice 33/64.0]
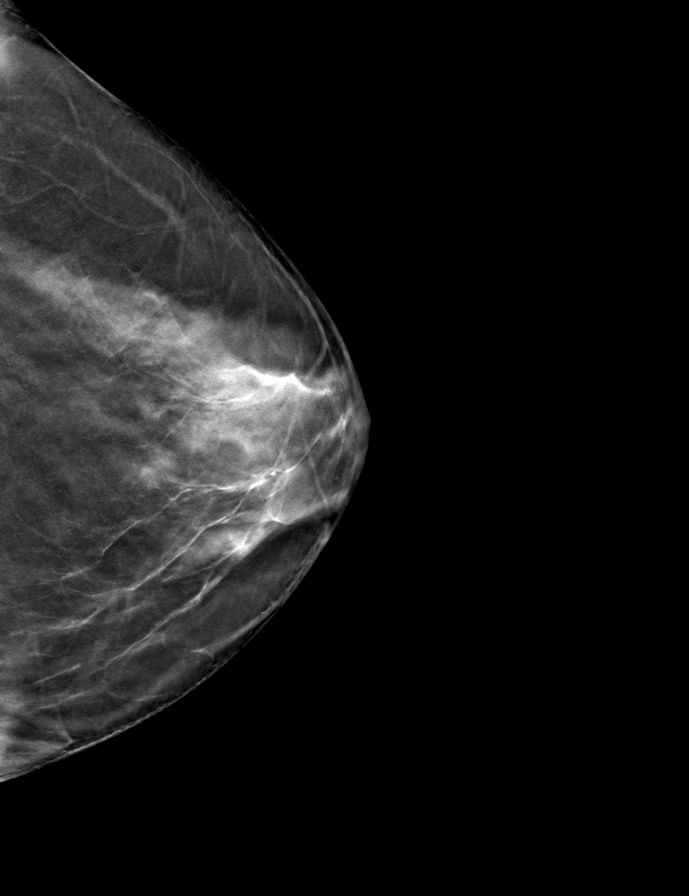

[9 of 24 positions shown; findings below may reference images not displayed]

ACR Breast Density Category c: The breast tissue is heterogeneously
dense, which may obscure small masses.
FINDINGS: There are no findings suspicious for malignancy. Images were
processed with CAD.
IMPRESSION: No mammographic evidence of malignancy. A result letter of this
screening mammogram will be mailed directly to the patient.

RECOMMENDATION:
Screening mammogram in one year. (Code:[5V])

BI-RADS CATEGORY  1: Negative.

## 2019-11-19 ENCOUNTER — Other Ambulatory Visit: Payer: Self-pay | Admitting: Internal Medicine

## 2019-11-19 DIAGNOSIS — E785 Hyperlipidemia, unspecified: Secondary | ICD-10-CM

## 2019-11-20 ENCOUNTER — Encounter: Payer: Self-pay | Admitting: *Deleted

## 2019-11-20 NOTE — Progress Notes (Signed)

## 2019-11-27 NOTE — Progress Notes (Signed)
Things That May Be Affecting Your Health:  Alcohol  Hearing loss  Pain    Depression  Home Safety  Sexual Health   Diabetes  Lack of physical activity  Stress   Difficulty with daily activities  Loneliness  Tiredness   Drug use  Medicines  Tobacco use   Falls  Motor Vehicle Safety  Weight   Food choices  Oral Health  Other    YOUR PERSONALIZED HEALTH PLAN : 1. Schedule your next subsequent Medicare Wellness visit in one year 2. Attend all of your regular appointments to address your medical issues 3. Complete the preventative screenings and services   Annual Wellness Visit   Medicare Covered Preventative Screenings and [place] Men and Women Who How Often Need? Date of Last Service Action  Abdominal Aortic Aneurysm Adults with AAA risk factors Once     Alcohol Misuse and Counseling All Adults Screening once a year if no alcohol misuse. Counseling up to 4 face to face sessions.     Bone Density Measurement  Adults at risk for osteoporosis Once every 2 yrs     Lipid Panel Z13.6 All adults without CV disease Once every 5 yrs     Colorectal Cancer   Stool sample or  Colonoscopy All adults 39 and older   Once every year  Every 10 years Yes FOBT - 11/13/2017   Depression All Adults Once a year  Today   Diabetes Screening Blood glucose, post glucose load, or GTT Z13.1  All adults at risk  Pre-diabetics  Once per year  Twice per year     Diabetes  Self-Management Training All adults Diabetics 10 hrs first year; 2 hours subsequent years. Requires Copay     Glaucoma  Diabetics  Family history of glaucoma  African Americans 45 yrs +  Hispanic Americans 62 yrs + Annually - requires coppay     Hepatitis C Z72.89 or F19.20  High Risk for HCV  Born between 1945 and 1965  Annually  Once     HIV Z11.4 All adults based on risk  Annually btw ages 44 & 43 regardless of risk  Annually > 65 yrs if at increased risk     Lung Cancer Screening  Asymptomatic adults aged 57-77 with 30 pack yr history and current smoker OR quit within the last 15 yrs Annually Must have counseling and shared decision making documentation before first screen     Medical Nutrition Therapy Adults with   Diabetes  Renal disease  Kidney transplant within past 3 yrs 3 hours first year; 2 hours subsequent years     Obesity and Counseling All adults Screening once a year Counseling if BMI 30 or higher  Today   Tobacco Use Counseling Adults who use tobacco  Up to 8 visits in one year     Vaccines Z23  Hepatitis B  Influenza   Pneumonia  Adults   Once  Once every flu season  Two different vaccines separated by one year Yes Prevnar 13 on 08/20/2018  Needs PPSV23  Next Annual Wellness Visit People with Medicare Every year  Today     Services & Screenings Women Who How Often Need  Date of Last Service Action  Mammogram  Z12.31 Women over 44 One baseline ages 76-39. Annually ager 40 yrs+     Pap tests All women Annually if high risk. Every 2 yrs for normal risk women     Screening for cervical cancer with  Pap (Z01.419 nl or Z01.411abnl) &  HPV Z11.51 Women aged 56 to 8 Once every 5 yrs     Screening pelvic and breast exams All women Annually if high risk. Every 2 yrs for normal risk women     Sexually Transmitted Diseases  Chlamydia  Gonorrhea  Syphilis All at risk adults Annually for non pregnant females at increased risk         [place] Men Who How Ofter Need  Date of Last Service Action  Prostate Cancer - DRE & PSA Men over 50 Annually.  DRE might require a copay.     Sexually Transmitted Diseases  Syphilis All at risk adults Annually for men at increased risk

## 2019-11-30 ENCOUNTER — Other Ambulatory Visit: Payer: Self-pay | Admitting: Internal Medicine

## 2019-11-30 DIAGNOSIS — R600 Localized edema: Secondary | ICD-10-CM

## 2020-01-01 ENCOUNTER — Encounter: Payer: Self-pay | Admitting: Internal Medicine

## 2020-01-01 ENCOUNTER — Ambulatory Visit (INDEPENDENT_AMBULATORY_CARE_PROVIDER_SITE_OTHER): Payer: Medicare Other | Admitting: Internal Medicine

## 2020-01-01 VITALS — BP 129/62 | HR 64 | Temp 97.8°F | Ht 66.0 in | Wt 167.2 lb

## 2020-01-01 DIAGNOSIS — R600 Localized edema: Secondary | ICD-10-CM | POA: Diagnosis not present

## 2020-01-01 DIAGNOSIS — R1030 Lower abdominal pain, unspecified: Secondary | ICD-10-CM

## 2020-01-01 DIAGNOSIS — R109 Unspecified abdominal pain: Secondary | ICD-10-CM | POA: Insufficient documentation

## 2020-01-01 DIAGNOSIS — I10 Essential (primary) hypertension: Secondary | ICD-10-CM | POA: Diagnosis not present

## 2020-01-01 DIAGNOSIS — Z0000 Encounter for general adult medical examination without abnormal findings: Secondary | ICD-10-CM | POA: Diagnosis not present

## 2020-01-01 MED ORDER — FUROSEMIDE 20 MG PO TABS: 60.0000 mg | ORAL_TABLET | Freq: Every day | ORAL | 5 refills | Status: DC

## 2020-01-01 MED ORDER — ONDANSETRON HCL 8 MG PO TABS: 8.0000 mg | ORAL_TABLET | Freq: Three times a day (TID) | ORAL | 1 refills | Status: DC | PRN

## 2020-01-01 NOTE — Assessment & Plan Note (Signed)
Patient is complaining of post prandial nausea and bilateral lower quadrant abdominal pain. She is having food aversion as a result and is scared to eat anything but soup. She has a history of prior cholecystectomy. She denies change in her BM, usually goes 1-2 times a day. No diarrhea. No weight loss or red flag symptoms. Will check CMP today. Zofran for symptomatic management. If symptoms persist, would consider further work up with abdominal ultrasound. Referring to GI today for colon cancer screening.

## 2020-01-01 NOTE — Progress Notes (Signed)
Subjective:   Patient ID: Katherine Haynes female   DOB: 10-Feb-1951 69 y.o.   MRN: [id number]  HPI: [name] is a 69 y.o. female with past medical history outlined below here for follow up of LE swelling. For the details of today's visit, please refer to the assessment and plan.   Past Medical History:  Diagnosis Date  . Hyperlipidemia   . Hypertension   . Menopause syndrome    Current Outpatient Medications  Medication Sig Dispense Refill  . atorvastatin (LIPITOR) 40 MG tablet TAKE 1 TABLET(40 MG) BY MOUTH DAILY 90 tablet 1  . amLODipine (NORVASC) 10 MG tablet TAKE 1 TABLET(10 MG) BY MOUTH DAILY 90 tablet 3  . diclofenac sodium (VOLTAREN) 1 % GEL Apply 2 g topically 4 (four) times daily as needed. 100 g 1  . furosemide (LASIX) 20 MG tablet TAKE 1 TABLET(20 MG) BY MOUTH TWICE DAILY AS NEEDED FOR FLUID RETENTION OR SWELLING 180 tablet 0  . gabapentin (NEURONTIN) 300 MG capsule Take 1 capsule (300 mg total) by mouth at bedtime. 90 capsule 3  . hydrOXYzine (ATARAX/VISTARIL) 50 MG tablet TAKE 1 TABLET(50 MG) BY MOUTH THREE TIMES DAILY AS NEEDED FOR ITCHING 90 tablet 0  . metoprolol tartrate (LOPRESSOR) 25 MG tablet TAKE 1 TABLET(25 MG) BY MOUTH TWICE DAILY 60 tablet 5  . naproxen (NAPROSYN) 500 MG tablet Take 1 tablet (500 mg total) by mouth 2 (two) times daily with a meal. 60 tablet 1   No current facility-administered medications for this visit.   Family History  Problem Relation Age of Onset  . Heart disease Mother        "enlarged heart"  . Hypertension Mother   . Diabetes Mother   . Heart disease Maternal Uncle        Bypass surg  . Heart attack Maternal Grandmother        also had irregular heart beat requriring a pacemaker.  . Hypertension Other        In multiple relatives.   . Hypertension Paternal Grandmother   . COPD Sister   . Sarcoidosis Sister   . Hypertension Brother   . Hypertension Sister   . Arthritis Sister   . COPD Sister   . Diabetes Sister     . Hypertension Sister   . Arthritis Sister   . Hypertension Brother   . Breast cancer Neg Hx    Social History   Socioeconomic History  . Marital status: Married    Spouse name: Not on file  . Number of children: Not on file  . Years of education: Not on file  . Highest education level: Not on file  Occupational History  . Not on file  Tobacco Use  . Smoking status: Former Games developer  . Smokeless tobacco: Never Used  . Tobacco comment:  1 PPD for 2-3 years. Quit ~1324.  Vaping Use  . Vaping Use: Never used  Substance and Sexual Activity  . Alcohol use: No  . Drug use: No  . Sexual activity: Yes    Birth control/protection: Surgical  Other Topics Concern  . Not on file  Social History Narrative   Lives with husband. Good relationship, feels safe at home. Studying medical administration.       Current Social History 09/27/2018        Patient lives with spouse in an apartment on the second floor. There are 14 steps with handrails up to the entrance the patient uses.  Patient's method of transportation is personal car.      The highest level of education was high school diploma.      The patient currently is employed as a Engineer, site in patient's homes.      Identified important Relationships are "My husband, my kids, my grands and great grands."       Pets : None       Interests / Fun: Watching TV, going to beach and park, cooking out with family.       Current Stressors: "finances, teenagers" (grandkids)       Religious / Personal Beliefs: Methodist, "I was raised to treat people the way you want to be treated.          Social Determinants of Health   Financial Resource Strain:   . Difficulty of Paying Living Expenses:   Food Insecurity:   . Worried About Programme researcher, broadcasting/film/video in the Last Year:   . Barista in the Last Year:   Transportation Needs:   . Freight forwarder (Medical):   Marland Kitchen Lack of Transportation (Non-Medical):   Physical  Activity:   . Days of Exercise per Week:   . Minutes of Exercise per Session:   Stress:   . Feeling of Stress :   Social Connections:   . Frequency of Communication with Friends and Family:   . Frequency of Social Gatherings with Friends and Family:   . Attends Religious Services:   . Active Member of Clubs or Organizations:   . Attends Banker Meetings:   Marland Kitchen Marital Status:     Review of Systems: Review of Systems  Respiratory: Negative for shortness of breath.   Cardiovascular: Negative for chest pain.  Gastrointestinal: Positive for abdominal pain and nausea.     Objective:  Physical Exam:  Vitals:   01/01/20 0913  BP: 129/62  Pulse: 64  Temp: 97.8 F (36.6 C)  TempSrc: Oral  SpO2: 100%  Weight: 167 lb 3.2 oz (75.8 kg)    Physical Exam Constitutional:      Appearance: Normal appearance.  Cardiovascular:     Rate and Rhythm: Normal rate and regular rhythm.     Heart sounds: Normal heart sounds.  Pulmonary:     Effort: Pulmonary effort is normal.     Breath sounds: Normal breath sounds.  Abdominal:     General: Abdomen is flat. Bowel sounds are normal.     Palpations: Abdomen is soft.  Musculoskeletal:     Right lower leg: Edema present.     Left lower leg: Edema present.  Skin:    General: Skin is warm and dry.  Neurological:     Mental Status: She is alert.      Assessment & Plan:   See Encounters Tab for problem based charting.

## 2020-01-01 NOTE — Assessment & Plan Note (Addendum)
Patient here with worsening bilateral lower extremity edema of unclear etiology. Mixed pitting and non pitting. On exam she has significant swelling with demarcation from her shoes. She is taking lasix 40 mg daily which was initially effective but seems to have stopped working. I have worked her up previously with echocardiogram and venous reflux studies which were both normal. She has no other signs or symptoms of heart failure. Renal function and liver function are normal as well. I have not checked a UA for proteinuria but I doubt nephrotic syndrome with normal renal function. Could consider checking this at follow up. She is on amlodipine but this was stopped for awhile without improvement in her swelling. She is intolerant to thiazide diuretics (orthostasis) and developed side effect of cough with lisinopril so we restarted CCB. She uses compression stockings but nothing seems to help her swelling. Plan to repeat blood work today. If renal function is stable, increase lasix to 60 mg daily.

## 2020-01-01 NOTE — Patient Instructions (Addendum)
Ms. Waddell,  It was a pleasure to see you today. I have referred you to GI for a screening colonoscopy. I have also given you a prescription for zofran, a nausea medication. You can try taking this 30 minutes before meals.   I have increased your lasix to 60 mg daily (3 tablets). I will call you if anything is abnormal on your blood work.   Follow up with me again in 6 months. If you have any questions or concerns, call our clinic at [phone] or after hours call [phone] and ask for the internal medicine resident on call. Thank you!  Dr. Philipp Ovens

## 2020-01-01 NOTE — Assessment & Plan Note (Signed)
Previously using immunochemical stool cards for annual colon cancer screening. In the setting of her abdominal pain and nausea, she is agreeable to GI referral for screening colonoscopy.  -- Referral placed

## 2020-01-01 NOTE — Assessment & Plan Note (Signed)
Well controlled, continue metoprolol 25 mg BID and amlodipine 10 mg daily.

## 2020-01-02 LAB — CMP14 + ANION GAP
ALT: 33 IU/L — ABNORMAL HIGH (ref 0–32)
AST: 40 IU/L (ref 0–40)
Albumin/Globulin Ratio: 1.5 (ref 1.2–2.2)
Albumin: 4.5 g/dL (ref 3.8–4.8)
Alkaline Phosphatase: 120 IU/L (ref 48–121)
Anion Gap: 14 mmol/L (ref 10.0–18.0)
BUN/Creatinine Ratio: 11 — ABNORMAL LOW (ref 12–28)
BUN: 12 mg/dL (ref 8–27)
Bilirubin Total: 0.4 mg/dL (ref 0.0–1.2)
CO2: 24 mmol/L (ref 20–29)
Calcium: 9.2 mg/dL (ref 8.7–10.3)
Chloride: 101 mmol/L (ref 96–106)
Creatinine, Ser: 1.07 mg/dL — ABNORMAL HIGH (ref 0.57–1.00)
GFR calc Af Amer: 62 mL/min/{1.73_m2} (ref >59)
GFR calc non Af Amer: 54 mL/min/{1.73_m2} — ABNORMAL LOW (ref >59)
Globulin, Total: 3.1 g/dL (ref 1.5–4.5)
Glucose: 102 mg/dL — ABNORMAL HIGH (ref 65–99)
Potassium: 4.6 mmol/L (ref 3.5–5.2)
Sodium: 139 mmol/L (ref 134–144)
Total Protein: 7.6 g/dL (ref 6.0–8.5)

## 2020-01-20 ENCOUNTER — Other Ambulatory Visit: Payer: Self-pay | Admitting: Internal Medicine

## 2020-01-20 DIAGNOSIS — R600 Localized edema: Secondary | ICD-10-CM

## 2020-01-21 ENCOUNTER — Other Ambulatory Visit: Payer: Self-pay | Admitting: Internal Medicine

## 2020-01-21 DIAGNOSIS — R1030 Lower abdominal pain, unspecified: Secondary | ICD-10-CM

## 2020-01-30 ENCOUNTER — Encounter: Payer: Self-pay | Admitting: *Deleted

## 2020-02-13 ENCOUNTER — Other Ambulatory Visit: Payer: Self-pay | Admitting: Internal Medicine

## 2020-02-13 DIAGNOSIS — I10 Essential (primary) hypertension: Secondary | ICD-10-CM

## 2020-02-19 ENCOUNTER — Encounter: Payer: Self-pay | Admitting: Gastroenterology

## 2020-02-21 DIAGNOSIS — H5203 Hypermetropia, bilateral: Secondary | ICD-10-CM | POA: Diagnosis not present

## 2020-02-21 DIAGNOSIS — H2513 Age-related nuclear cataract, bilateral: Secondary | ICD-10-CM | POA: Diagnosis not present

## 2020-02-21 DIAGNOSIS — H52223 Regular astigmatism, bilateral: Secondary | ICD-10-CM | POA: Diagnosis not present

## 2020-02-21 DIAGNOSIS — H524 Presbyopia: Secondary | ICD-10-CM | POA: Diagnosis not present

## 2020-03-05 DIAGNOSIS — Z1211 Encounter for screening for malignant neoplasm of colon: Secondary | ICD-10-CM | POA: Diagnosis not present

## 2020-03-05 DIAGNOSIS — R112 Nausea with vomiting, unspecified: Secondary | ICD-10-CM | POA: Diagnosis not present

## 2020-04-09 ENCOUNTER — Other Ambulatory Visit: Payer: Self-pay | Admitting: Internal Medicine

## 2020-04-09 DIAGNOSIS — R232 Flushing: Secondary | ICD-10-CM

## 2020-04-15 ENCOUNTER — Encounter: Payer: Self-pay | Admitting: Internal Medicine

## 2020-04-15 NOTE — Progress Notes (Signed)
Copy of GI referral to [facility] physicians, Dr. Michail Sermon, visit date 03/05/2020: Referral had been placed for evaluation of abdominal pain, nausea, and vomiting, which had dissipated by the time of the specialty visit.  Colon cancer screening planned for 04/29/2020, colonoscopy.

## 2020-04-17 ENCOUNTER — Ambulatory Visit: Payer: Medicare Other | Admitting: Gastroenterology

## 2020-05-05 DIAGNOSIS — Z1159 Encounter for screening for other viral diseases: Secondary | ICD-10-CM | POA: Diagnosis not present

## 2020-05-08 DIAGNOSIS — D124 Benign neoplasm of descending colon: Secondary | ICD-10-CM | POA: Diagnosis not present

## 2020-05-08 DIAGNOSIS — Z1211 Encounter for screening for malignant neoplasm of colon: Secondary | ICD-10-CM | POA: Diagnosis not present

## 2020-05-08 DIAGNOSIS — K635 Polyp of colon: Secondary | ICD-10-CM | POA: Diagnosis not present

## 2020-05-08 DIAGNOSIS — K640 First degree hemorrhoids: Secondary | ICD-10-CM | POA: Diagnosis not present

## 2020-05-08 DIAGNOSIS — D123 Benign neoplasm of transverse colon: Secondary | ICD-10-CM | POA: Diagnosis not present

## 2020-05-08 DIAGNOSIS — K5730 Diverticulosis of large intestine without perforation or abscess without bleeding: Secondary | ICD-10-CM | POA: Diagnosis not present

## 2020-05-11 ENCOUNTER — Other Ambulatory Visit: Payer: Self-pay | Admitting: Internal Medicine

## 2020-05-11 DIAGNOSIS — I10 Essential (primary) hypertension: Secondary | ICD-10-CM

## 2020-05-12 DIAGNOSIS — D123 Benign neoplasm of transverse colon: Secondary | ICD-10-CM | POA: Diagnosis not present

## 2020-05-12 DIAGNOSIS — K635 Polyp of colon: Secondary | ICD-10-CM | POA: Diagnosis not present

## 2020-05-12 DIAGNOSIS — D124 Benign neoplasm of descending colon: Secondary | ICD-10-CM | POA: Diagnosis not present

## 2020-05-20 ENCOUNTER — Ambulatory Visit: Payer: Medicare Other | Admitting: *Deleted

## 2020-05-20 ENCOUNTER — Other Ambulatory Visit: Payer: Self-pay

## 2020-05-30 ENCOUNTER — Ambulatory Visit: Payer: Medicare Other | Attending: Internal Medicine

## 2020-05-30 DIAGNOSIS — Z23 Encounter for immunization: Secondary | ICD-10-CM

## 2020-05-30 NOTE — Progress Notes (Signed)
Covid-19 Vaccination Clinic  Name:  Katherine Haynes    MRN: [id number] DOB: 1950/09/10  05/30/2020  Katherine Haynes was observed post Covid-19 immunization for 15 minutes without incident. She was provided with Vaccine Information Sheet and instruction to access the V-Safe system.   Katherine Haynes was instructed to call 911 with any severe reactions post vaccine: Marland Kitchen Difficulty breathing  . Swelling of face and throat  . A fast heartbeat  . A bad rash all over body  . Dizziness and weakness

## 2020-06-10 ENCOUNTER — Other Ambulatory Visit: Payer: Self-pay

## 2020-06-10 ENCOUNTER — Encounter: Payer: Self-pay | Admitting: Internal Medicine

## 2020-06-10 ENCOUNTER — Ambulatory Visit (INDEPENDENT_AMBULATORY_CARE_PROVIDER_SITE_OTHER): Payer: Medicare Other | Admitting: Internal Medicine

## 2020-06-10 DIAGNOSIS — N951 Menopausal and female climacteric states: Secondary | ICD-10-CM

## 2020-06-10 DIAGNOSIS — R600 Localized edema: Secondary | ICD-10-CM | POA: Diagnosis not present

## 2020-06-10 DIAGNOSIS — I10 Essential (primary) hypertension: Secondary | ICD-10-CM

## 2020-06-10 DIAGNOSIS — E785 Hyperlipidemia, unspecified: Secondary | ICD-10-CM | POA: Diagnosis not present

## 2020-06-10 DIAGNOSIS — R232 Flushing: Secondary | ICD-10-CM | POA: Diagnosis not present

## 2020-06-10 MED ORDER — METOPROLOL TARTRATE 25 MG PO TABS: 25.0000 mg | ORAL_TABLET | Freq: Two times a day (BID) | ORAL | 1 refills | Status: DC

## 2020-06-10 MED ORDER — FUROSEMIDE 40 MG PO TABS: 60.0000 mg | ORAL_TABLET | Freq: Every day | ORAL | 1 refills | Status: DC

## 2020-06-10 MED ORDER — ATORVASTATIN CALCIUM 40 MG PO TABS: ORAL_TABLET | ORAL | 1 refills | Status: DC

## 2020-06-10 MED ORDER — AMLODIPINE BESYLATE 10 MG PO TABS: ORAL_TABLET | ORAL | 3 refills | Status: DC

## 2020-06-10 MED ORDER — GABAPENTIN 300 MG PO CAPS: 600.0000 mg | ORAL_CAPSULE | Freq: Every day | ORAL | 1 refills | Status: DC

## 2020-06-10 NOTE — Patient Instructions (Signed)
Katherine Haynes,  It was a pleasure to see you. I have sent refills of your medication to your pharmacy. I will call you with the results of your blood work.   I changed your lasix prescription to 40 mg tablets. Please take 1 and 1/2 tablets a day (60 mg). I also increased your gabapentin to 600 mg. Please take two of the 300 mg tablets at night before bed.  Follow up with me again in 6 months. If you have any questions or concerns, call our clinic at [phone] or after hours call [phone] and ask for the internal medicine resident on call. Thank you!  Happy Thanksgiving!   Dr. Philipp Ovens

## 2020-06-11 LAB — BMP8+ANION GAP
Anion Gap: 12 mmol/L (ref 10.0–18.0)
BUN/Creatinine Ratio: 9 — ABNORMAL LOW (ref 12–28)
BUN: 11 mg/dL (ref 8–27)
CO2: 25 mmol/L (ref 20–29)
Calcium: 9.2 mg/dL (ref 8.7–10.3)
Chloride: 104 mmol/L (ref 96–106)
Creatinine, Ser: 1.17 mg/dL — ABNORMAL HIGH (ref 0.57–1.00)
GFR calc Af Amer: 56 mL/min/{1.73_m2} — ABNORMAL LOW (ref >59)
GFR calc non Af Amer: 48 mL/min/{1.73_m2} — ABNORMAL LOW (ref >59)
Glucose: 104 mg/dL — ABNORMAL HIGH (ref 65–99)
Potassium: 5.1 mmol/L (ref 3.5–5.2)
Sodium: 141 mmol/L (ref 134–144)

## 2020-06-15 ENCOUNTER — Encounter: Payer: Self-pay | Admitting: Internal Medicine

## 2020-06-15 NOTE — Assessment & Plan Note (Signed)
Chronic and well controlled. Continue metoprolol 25 mg BID and amlodipine 10 mg daily.

## 2020-06-15 NOTE — Assessment & Plan Note (Signed)
Patient has bilateral lower extremity edema that is now chronic. It developed suddenly one year ago but has persisted. Etiology is unclear. Previous work up including venous reflux studies has been unremarkable. We have been managing her on lasix 60 mg daily and compression stockings. We trialed her off amlodipine for awhile but her swelling did not improve. This was restarted due to limited options for HTN therapy (cough with ACEi and orthostatic hypotension with HCTZ). On exam today she does have persistent swelling, bilateral non pitting edema to her mid shin. She has compression stockings in place. She feels the lasix helps. On days she does not take it her swelling is much worse. Plan to continue current management for now. Could consider another trial off amlodipine in the future.  -- Continue lasix 60 mg daily -- BMP today with stable renal function, however slight downtrend from one year ago. Continue to monitor closely.

## 2020-06-15 NOTE — Progress Notes (Signed)
Subjective:   Patient ID: Katherine Haynes female   DOB: 1950-10-06 69 y.o.   MRN: [id number]  HPI: [name] is a 69 y.o. female with past medical history outlined below here for HTN follow up. For the details of today's visit, please refer to the assessment and plan.   Past Medical History:  Diagnosis Date  . Hyperlipidemia   . Hypertension   . Menopause syndrome    Current Outpatient Medications  Medication Sig Dispense Refill  . amLODipine (NORVASC) 10 MG tablet TAKE 1 TABLET(10 MG) BY MOUTH DAILY 90 tablet 3  . atorvastatin (LIPITOR) 40 MG tablet TAKE 1 TABLET(40 MG) BY MOUTH DAILY 90 tablet 1  . diclofenac sodium (VOLTAREN) 1 % GEL Apply 2 g topically 4 (four) times daily as needed. 100 g 1  . furosemide (LASIX) 40 MG tablet Take 1.5 tablets (60 mg total) by mouth daily. 135 tablet 1  . gabapentin (NEURONTIN) 300 MG capsule Take 2 capsules (600 mg total) by mouth at bedtime. 180 capsule 1  . hydrOXYzine (ATARAX/VISTARIL) 50 MG tablet TAKE 1 TABLET(50 MG) BY MOUTH THREE TIMES DAILY AS NEEDED FOR ITCHING 90 tablet 0  . metoprolol tartrate (LOPRESSOR) 25 MG tablet Take 1 tablet (25 mg total) by mouth 2 (two) times daily. 180 tablet 1  . naproxen (NAPROSYN) 500 MG tablet Take 1 tablet (500 mg total) by mouth 2 (two) times daily with a meal. 60 tablet 1   No current facility-administered medications for this visit.   Family History  Problem Relation Age of Onset  . Heart disease Mother        "enlarged heart"  . Hypertension Mother   . Diabetes Mother   . Heart disease Maternal Uncle        Bypass surg  . Heart attack Maternal Grandmother        also had irregular heart beat requriring a pacemaker.  . Hypertension Other        In multiple relatives.   . Hypertension Paternal Grandmother   . COPD Sister   . Sarcoidosis Sister   . Hypertension Brother   . Hypertension Sister   . Arthritis Sister   . COPD Sister   . Diabetes Sister   . Hypertension Sister   .  Arthritis Sister   . Hypertension Brother   . Breast cancer Neg Hx    Social History   Socioeconomic History  . Marital status: Married    Spouse name: Not on file  . Number of children: Not on file  . Years of education: Not on file  . Highest education level: Not on file  Occupational History  . Not on file  Tobacco Use  . Smoking status: Former Games developer  . Smokeless tobacco: Never Used  . Tobacco comment:  1 PPD for 2-3 years. Quit ~8295.  Vaping Use  . Vaping Use: Never used  Substance and Sexual Activity  . Alcohol use: No  . Drug use: No  . Sexual activity: Yes    Birth control/protection: Surgical  Other Topics Concern  . Not on file  Social History Narrative   Lives with husband. Good relationship, feels safe at home. Studying medical administration.       Current Social History 09/27/2018        Patient lives with spouse in an apartment on the second floor. There are 14 steps with handrails up to the entrance the patient uses.       Patient's method of transportation  is personal car.      The highest level of education was high school diploma.      The patient currently is employed as a Engineer, site in patient's homes.      Identified important Relationships are "My husband, my kids, my grands and great grands."       Pets : None       Interests / Fun: Watching TV, going to beach and park, cooking out with family.       Current Stressors: "finances, teenagers" (grandkids)       Religious / Personal Beliefs: Methodist, "I was raised to treat people the way you want to be treated.          Social Determinants of Health   Financial Resource Strain:   . Difficulty of Paying Living Expenses: Not on file  Food Insecurity:   . Worried About Programme researcher, broadcasting/film/video in the Last Year: Not on file  . Ran Out of Food in the Last Year: Not on file  Transportation Needs:   . Lack of Transportation (Medical): Not on file  . Lack of Transportation (Non-Medical): Not  on file  Physical Activity:   . Days of Exercise per Week: Not on file  . Minutes of Exercise per Session: Not on file  Stress:   . Feeling of Stress : Not on file  Social Connections:   . Frequency of Communication with Friends and Family: Not on file  . Frequency of Social Gatherings with Friends and Family: Not on file  . Attends Religious Services: Not on file  . Active Member of Clubs or Organizations: Not on file  . Attends Banker Meetings: Not on file  . Marital Status: Not on file    Review of Systems: Review of Systems  Respiratory: Negative for shortness of breath.   Cardiovascular: Negative for chest pain.     Objective:  Physical Exam:  Vitals:   06/10/20 1008  BP: 121/63  Pulse: 65  Temp: 97.8 F (36.6 C)  TempSrc: Oral  SpO2: 100%  Weight: 161 lb (73 kg)  Height: 5\' 6"  (1.676 m)    Physical Exam Constitutional:      Appearance: Normal appearance.  Cardiovascular:     Rate and Rhythm: Normal rate and regular rhythm.     Heart sounds: Normal heart sounds.  Pulmonary:     Effort: Pulmonary effort is normal. No respiratory distress.     Breath sounds: Normal breath sounds.  Musculoskeletal:     Right lower leg: Edema present.     Left lower leg: Edema present.     Comments: Compression stockings in place   Skin:    General: Skin is warm and dry.  Neurological:     Mental Status: She is alert.      Assessment & Plan:   See Encounters Tab for problem based charting.

## 2020-06-15 NOTE — Assessment & Plan Note (Signed)
Patient has hot flashes that are mostly bothersome at night. She takes gabapentin 300 mg which helps, but she feels her hot flashes are getting worse. She would like to increase her gabapentin to see if this helps -- Increase gabapentin to 600 mg QHS

## 2020-06-24 ENCOUNTER — Encounter: Payer: Self-pay | Admitting: *Deleted

## 2020-06-24 NOTE — Progress Notes (Signed)

## 2020-06-25 ENCOUNTER — Other Ambulatory Visit: Payer: Self-pay | Admitting: Internal Medicine

## 2020-06-25 DIAGNOSIS — E785 Hyperlipidemia, unspecified: Secondary | ICD-10-CM

## 2020-06-29 NOTE — Progress Notes (Signed)
Things That May Be Affecting Your Health:  Alcohol  Hearing loss  Pain    Depression  Home Safety  Sexual Health   Diabetes  Lack of physical activity  Stress   Difficulty with daily activities  Loneliness  Tiredness   Drug use  Medicines  Tobacco use   Falls  Motor Vehicle Safety  Weight   Food choices  Oral Health  Other    YOUR PERSONALIZED HEALTH PLAN : 1. Schedule your next subsequent Medicare Wellness visit in one year 2. Attend all of your regular appointments to address your medical issues 3. Complete the preventative screenings and services   Annual Wellness Visit   Medicare Covered Preventative Screenings and [place] Men and Women Who How Often Need? Date of Last Service Action  Abdominal Aortic Aneurysm Adults with AAA risk factors Once     Alcohol Misuse and Counseling All Adults Screening once a year if no alcohol misuse. Counseling up to 4 face to face sessions.     Bone Density Measurement  Adults at risk for osteoporosis Once every 2 yrs Yes 12/18/2018  Please repeat  Lipid Panel Z13.6 All adults without CV disease Once every 5 yrs     Colorectal Cancer   Stool sample or  Colonoscopy All adults 63 and older   Once every year  Every 10 years Yes 11/13/2017  Please repeat immunochemical FOBT  Depression All Adults Once a year  Today   Diabetes Screening Blood glucose, post glucose load, or GTT Z13.1  All adults at risk  Pre-diabetics  Once per year  Twice per year Yes   Please screen with Hgb A1c  Diabetes  Self-Management Training All adults Diabetics 10 hrs first year; 2 hours subsequent years. Requires Copay     Glaucoma  Diabetics  Family history of glaucoma  African Americans 60 yrs +  Hispanic Americans 46 yrs + Annually - requires coppay     Hepatitis C Z72.89 or F19.20  High Risk for HCV  Born between 1945 and 1965  Annually  Once     HIV Z11.4 All adults based on risk  Annually btw ages 35 & 97 regardless of  risk  Annually > 65 yrs if at increased risk     Lung Cancer Screening Asymptomatic adults aged 74-77 with 30 pack yr history and current smoker OR quit within the last 15 yrs Annually Must have counseling and shared decision making documentation before first screen     Medical Nutrition Therapy Adults with   Diabetes  Renal disease  Kidney transplant within past 3 yrs 3 hours first year; 2 hours subsequent years     Obesity and Counseling All adults Screening once a year Counseling if BMI 30 or higher  Today   Tobacco Use Counseling Adults who use tobacco  Up to 8 visits in one year     Vaccines Z23  Hepatitis B  Influenza   Pneumonia  Adults   Once  Once every flu season  Two different vaccines separated by one year Yes   Flu shot & PPSV23  Next Annual Wellness Visit People with Medicare Every year  Today     Services & Screenings Women Who How Often Need  Date of Last Service Action  Mammogram  Z12.31 Women over 63 One baseline ages 72-39. Annually ager 40 yrs+     Pap tests All women Annually if high risk. Every 2 yrs for normal risk women  Screening for cervical cancer with   Pap (Z01.419 nl or Z01.411abnl) &  HPV Z11.51 Women aged 6 to 7 Once every 5 yrs     Screening pelvic and breast exams All women Annually if high risk. Every 2 yrs for normal risk women     Sexually Transmitted Diseases  Chlamydia  Gonorrhea  Syphilis All at risk adults Annually for non pregnant females at increased risk         [place] Men Who How Ofter Need  Date of Last Service Action  Prostate Cancer - DRE & PSA Men over 50 Annually.  DRE might require a copay.     Sexually Transmitted Diseases  Syphilis All at risk adults Annually for men at increased risk

## 2020-07-02 ENCOUNTER — Other Ambulatory Visit: Payer: Self-pay | Admitting: Internal Medicine

## 2020-07-02 DIAGNOSIS — R600 Localized edema: Secondary | ICD-10-CM

## 2020-07-16 ENCOUNTER — Encounter: Payer: Self-pay | Admitting: Internal Medicine

## 2020-07-20 MED ORDER — FUROSEMIDE 20 MG PO TABS: 60.0000 mg | ORAL_TABLET | Freq: Every day | ORAL | 11 refills | Status: DC

## 2020-10-06 ENCOUNTER — Ambulatory Visit (INDEPENDENT_AMBULATORY_CARE_PROVIDER_SITE_OTHER): Payer: Medicare Other

## 2020-10-06 DIAGNOSIS — Z111 Encounter for screening for respiratory tuberculosis: Secondary | ICD-10-CM | POA: Diagnosis not present

## 2020-10-08 LAB — TB SKIN TEST
Induration: 0 mm
TB Skin Test: NEGATIVE

## 2020-10-20 ENCOUNTER — Encounter (HOSPITAL_COMMUNITY): Payer: Self-pay | Admitting: Pharmacy Technician

## 2020-10-20 ENCOUNTER — Other Ambulatory Visit: Payer: Self-pay

## 2020-10-20 ENCOUNTER — Emergency Department (HOSPITAL_COMMUNITY): Payer: Medicare Other

## 2020-10-20 ENCOUNTER — Emergency Department (HOSPITAL_COMMUNITY)
Admission: EM | Admit: 2020-10-20 | Discharge: 2020-10-20 | Disposition: A | Discharge status: Home / Self Care | Payer: Medicare Other | Attending: Emergency Medicine | Admitting: Emergency Medicine

## 2020-10-20 ENCOUNTER — Encounter (HOSPITAL_COMMUNITY): Payer: Self-pay

## 2020-10-20 ENCOUNTER — Ambulatory Visit (HOSPITAL_COMMUNITY)
Admission: EM | Admit: 2020-10-20 | Discharge: 2020-10-20 | Payer: Medicare Other | Attending: Emergency Medicine | Admitting: Emergency Medicine

## 2020-10-20 DIAGNOSIS — R079 Chest pain, unspecified: Secondary | ICD-10-CM

## 2020-10-20 DIAGNOSIS — R5383 Other fatigue: Secondary | ICD-10-CM | POA: Insufficient documentation

## 2020-10-20 DIAGNOSIS — I10 Essential (primary) hypertension: Secondary | ICD-10-CM | POA: Diagnosis not present

## 2020-10-20 DIAGNOSIS — R200 Anesthesia of skin: Secondary | ICD-10-CM | POA: Diagnosis not present

## 2020-10-20 DIAGNOSIS — R0789 Other chest pain: Secondary | ICD-10-CM | POA: Insufficient documentation

## 2020-10-20 DIAGNOSIS — R519 Headache, unspecified: Secondary | ICD-10-CM | POA: Insufficient documentation

## 2020-10-20 DIAGNOSIS — R531 Weakness: Secondary | ICD-10-CM | POA: Diagnosis not present

## 2020-10-20 DIAGNOSIS — R42 Dizziness and giddiness: Secondary | ICD-10-CM | POA: Diagnosis not present

## 2020-10-20 DIAGNOSIS — Z79899 Other long term (current) drug therapy: Secondary | ICD-10-CM | POA: Diagnosis not present

## 2020-10-20 DIAGNOSIS — Z87891 Personal history of nicotine dependence: Secondary | ICD-10-CM | POA: Insufficient documentation

## 2020-10-20 DIAGNOSIS — I447 Left bundle-branch block, unspecified: Secondary | ICD-10-CM | POA: Diagnosis not present

## 2020-10-20 DIAGNOSIS — R202 Paresthesia of skin: Secondary | ICD-10-CM | POA: Diagnosis not present

## 2020-10-20 LAB — BASIC METABOLIC PANEL
Anion gap: 5 (ref 5–15)
BUN: 9 mg/dL (ref 8–23)
CO2: 29 mmol/L (ref 22–32)
Calcium: 9.1 mg/dL (ref 8.9–10.3)
Chloride: 102 mmol/L (ref 98–111)
Creatinine, Ser: 0.94 mg/dL (ref 0.44–1.00)
GFR, Estimated: >60 mL/min (ref >60)
Glucose, Bld: 118 mg/dL — ABNORMAL HIGH (ref 70–99)
Potassium: 3.3 mmol/L — ABNORMAL LOW (ref 3.5–5.1)
Sodium: 136 mmol/L (ref 135–145)

## 2020-10-20 LAB — URINALYSIS, ROUTINE W REFLEX MICROSCOPIC
Bacteria, UA: NONE SEEN
Bilirubin Urine: NEGATIVE
Glucose, UA: NEGATIVE mg/dL
Ketones, ur: NEGATIVE mg/dL
Leukocytes,Ua: NEGATIVE
Nitrite: NEGATIVE
Protein, ur: NEGATIVE mg/dL
Specific Gravity, Urine: 1.017 (ref 1.005–1.030)
pH: 5 (ref 5.0–8.0)

## 2020-10-20 LAB — CBC WITH DIFFERENTIAL/PLATELET
Abs Immature Granulocytes: 0.01 10*3/uL (ref 0.00–0.07)
Basophils Absolute: 0 10*3/uL (ref 0.0–0.1)
Basophils Relative: 1 %
Eosinophils Absolute: 0.1 10*3/uL (ref 0.0–0.5)
Eosinophils Relative: 2 %
HCT: 45.7 % (ref 36.0–46.0)
Hemoglobin: 15.4 g/dL — ABNORMAL HIGH (ref 12.0–15.0)
Immature Granulocytes: 0 %
Lymphocytes Relative: 40 %
Lymphs Abs: 2.4 10*3/uL (ref 0.7–4.0)
MCH: 32.4 pg (ref 26.0–34.0)
MCHC: 33.7 g/dL (ref 30.0–36.0)
MCV: 96 fL (ref 80.0–100.0)
Monocytes Absolute: 0.6 10*3/uL (ref 0.1–1.0)
Monocytes Relative: 9 %
Neutro Abs: 2.8 10*3/uL (ref 1.7–7.7)
Neutrophils Relative %: 48 %
Platelets: 277 10*3/uL (ref 150–400)
RBC: 4.76 MIL/uL (ref 3.87–5.11)
RDW: 12.4 % (ref 11.5–15.5)
WBC: 5.9 10*3/uL (ref 4.0–10.5)
nRBC: 0 % (ref 0.0–0.2)

## 2020-10-20 LAB — TROPONIN I (HIGH SENSITIVITY)
Troponin I (High Sensitivity): 5 ng/L (ref <18)
Troponin I (High Sensitivity): 5 ng/L (ref <18)

## 2020-10-20 LAB — MAGNESIUM: Magnesium: 2.2 mg/dL (ref 1.7–2.4)

## 2020-10-20 IMAGING — MR MR HEAD W/O CM
9 of 10 series · 37 of 48 positions shown · non-contrast
Comparison: CT head [DATE]

CLINICAL DATA: Dizziness

EXAM:
MRI HEAD WITHOUT CONTRAST
TECHNIQUE: Multiplanar, multiecho pulse sequences of the brain and surrounding
structures were obtained without intravenous contrast.

[Series 4: DWI · axial · 3.0mm · 1.09mm/px · z∈[-63,+77]mm · 9 of 98 slices shown (1 of 4)]
[im 1/98]
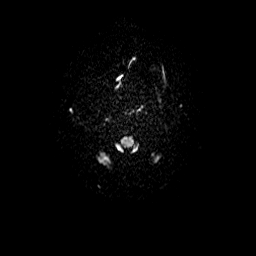
[im 13/98]
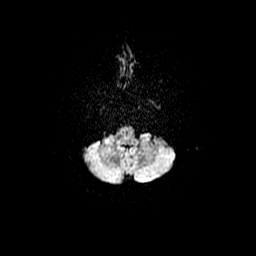
[im 25/98]
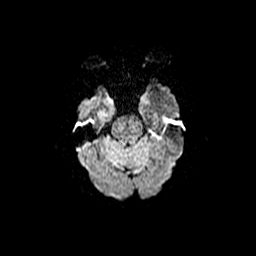
[im 37/98]
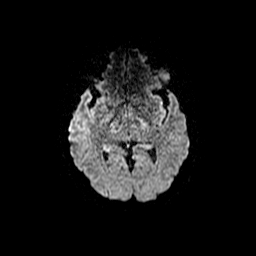
[im 49/98]
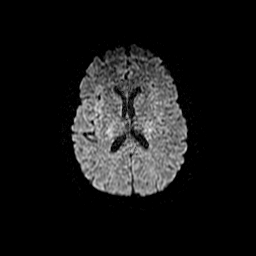
[im 61/98]
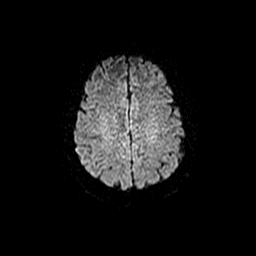
[im 73/98]
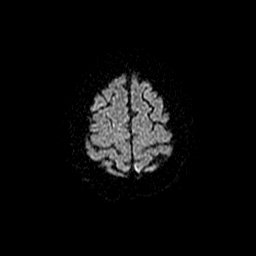
[im 85/98]
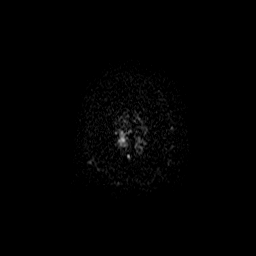
[im 98/98]
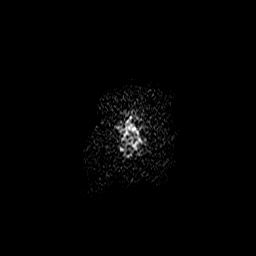

[Series 5: DWI · coronal · 5.0mm · 1.09mm/px · 7 of 78 slices shown (2 of 4)]
[im 1/78]
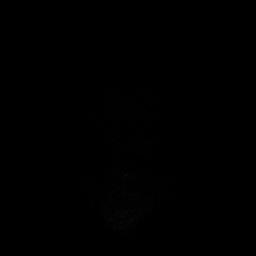
[im 13/78]
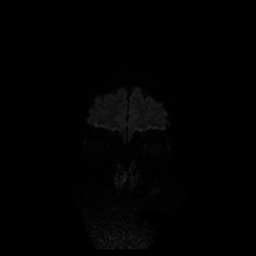
[im 26/78]
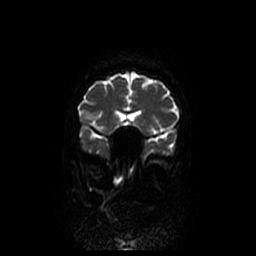
[im 39/78]
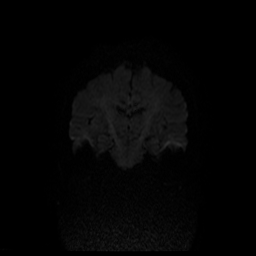
[im 52/78]
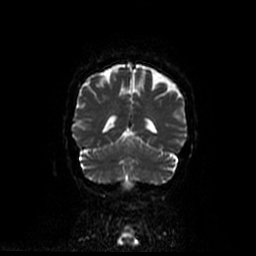
[im 65/78]
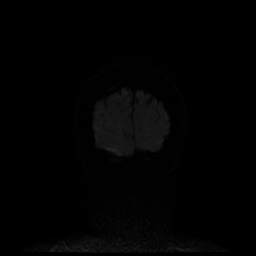
[im 78/78]
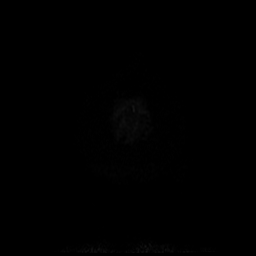

[Series 6: T1 · sagittal · 5.0mm · 0.47mm/px · 2 of 25 slices shown (1 of 2)]
[im 1/25]
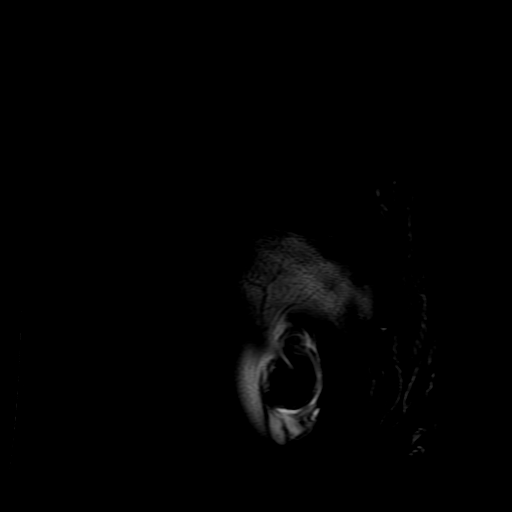
[im 25/25]
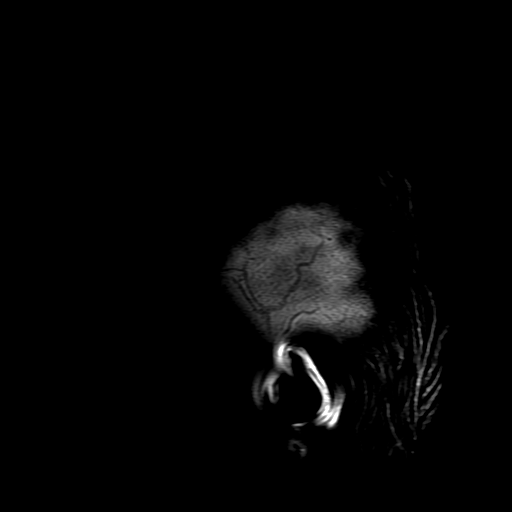

[Series 7: T2 · axial · 5.0mm · 0.43mm/px · z∈[-55,+99]mm · 3 of 27 slices shown (1 of 2)]
[im 1/27]
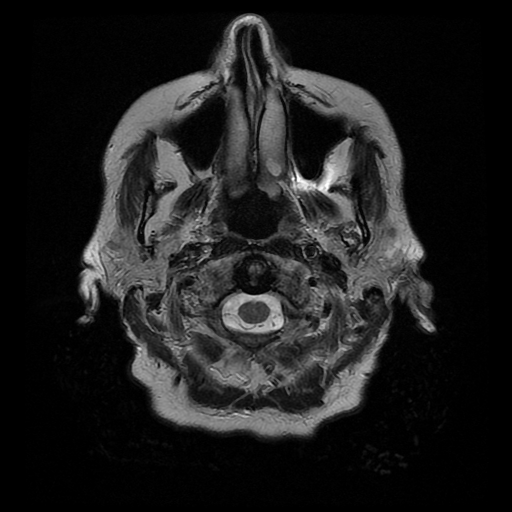
[im 14/27]
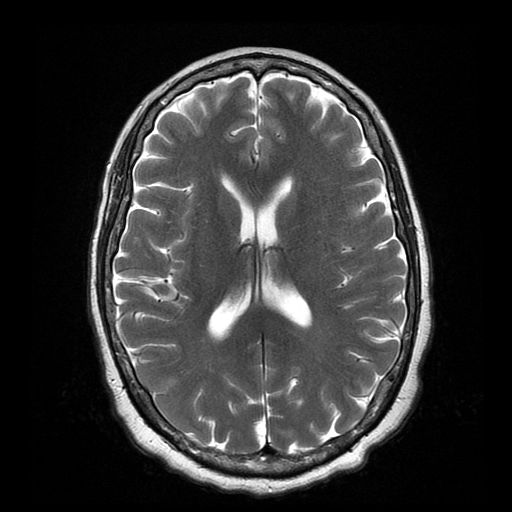
[im 27/27]
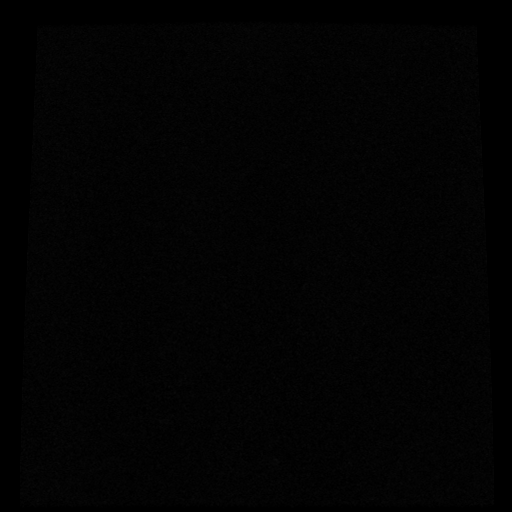

[Series 8: FLAIR · axial · 3.0mm · 0.43mm/px · z∈[-53,+95]mm · 3 of 26 slices shown]
[im 1/26]
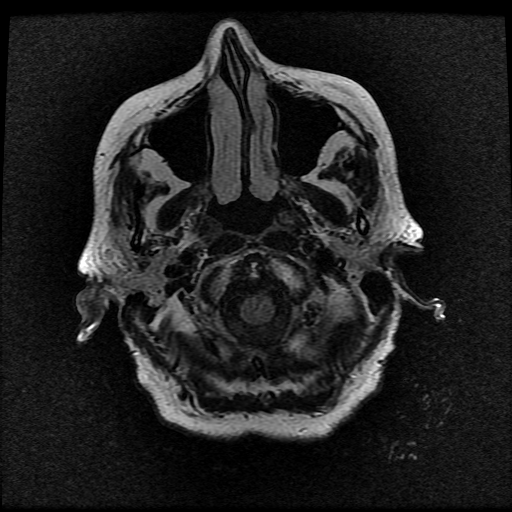
[im 13/26]
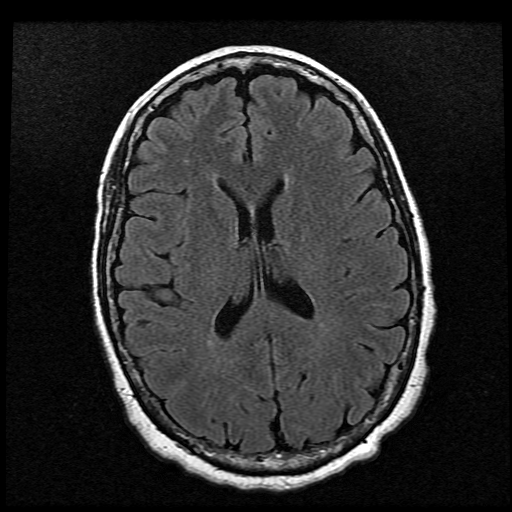
[im 26/26]
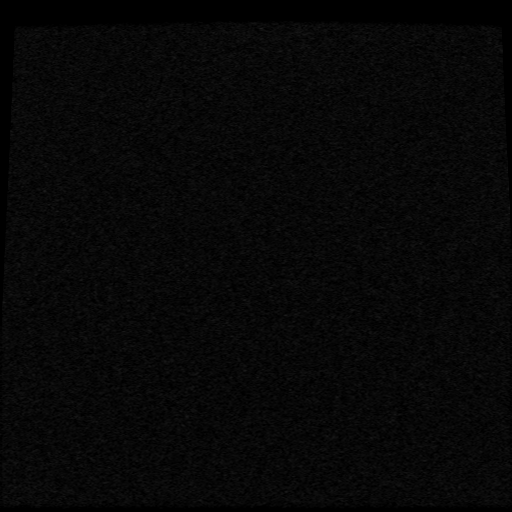

[Series 10: T1 · axial · 3.0mm · 0.47mm/px · 1 of 100 slices shown (2 of 2)]
[im 1/100]
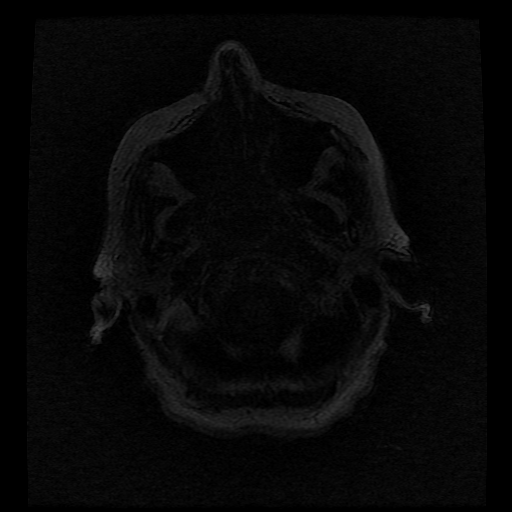

[Series 11: T2 · coronal · 5.0mm · 0.39mm/px · 3 of 29 slices shown (2 of 2)]
[im 1/29]
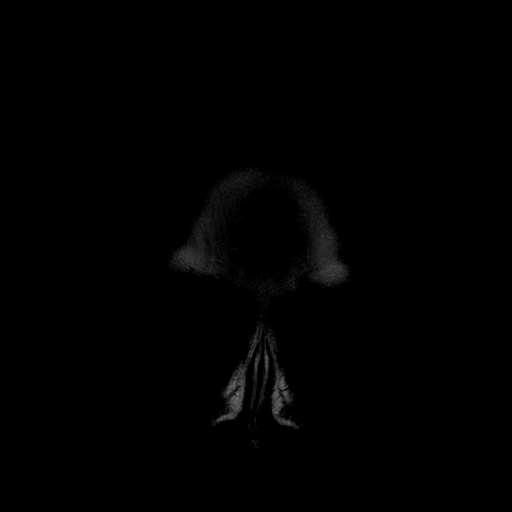
[im 15/29]
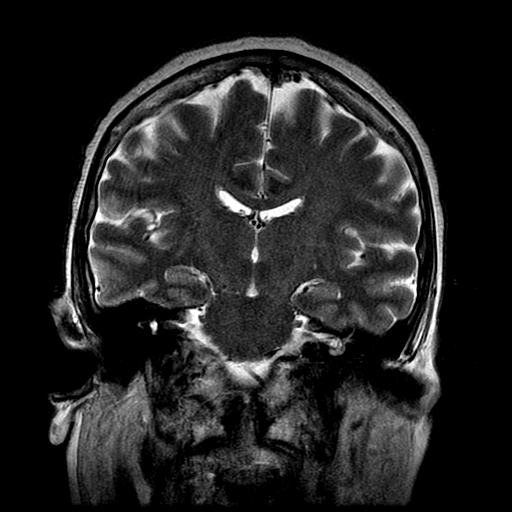
[im 29/29]
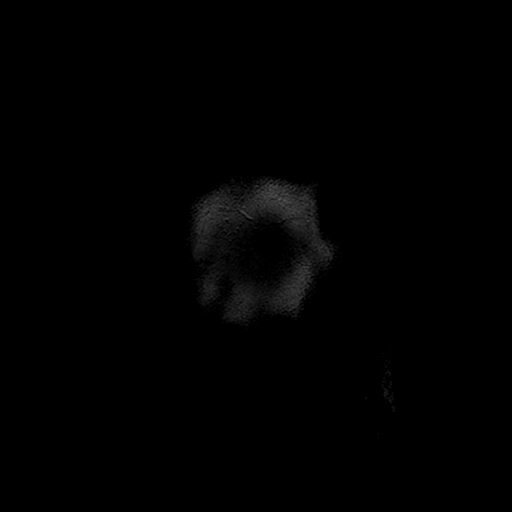

[Series 400: DWI · axial · 3.0mm · 1.09mm/px · z∈[-63,+77]mm · 5 of 49 slices shown (3 of 4)]
[im 1/49]
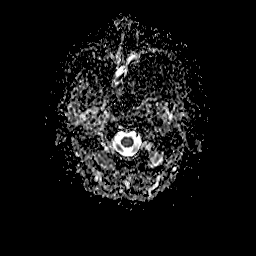
[im 13/49]
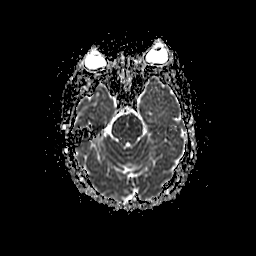
[im 25/49]
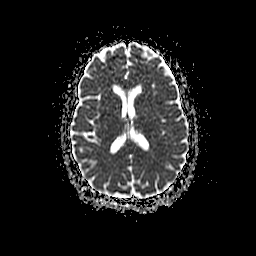
[im 37/49]
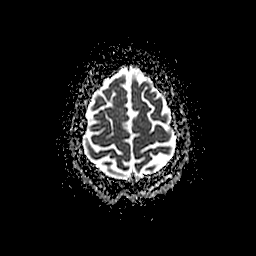
[im 49/49]
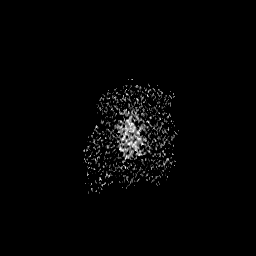

[Series 500: DWI · coronal · 5.0mm · 1.09mm/px · 4 of 39 slices shown (4 of 4)]
[im 1/39]
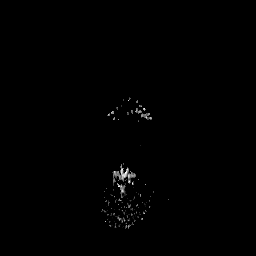
[im 13/39]
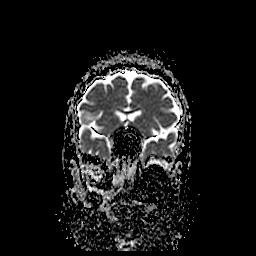
[im 26/39]
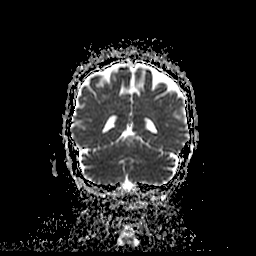
[im 39/39]
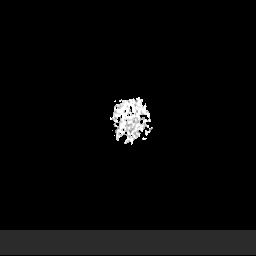

[37 of 48 positions shown; findings below may reference images not displayed]

FINDINGS: Brain: No acute infarction, hemorrhage, hydrocephalus, extra-axial
collection or mass lesion. Minimal white matter changes with several
small white matter hyperintensities bilaterally. Normal brainstem
and cerebellum.

Vascular: Normal arterial flow voids.

Skull and upper cervical spine: Negative

Sinuses/Orbits: Paranasal sinuses clear.  Negative orbit

Other: None
IMPRESSION: Negative MRI head.

## 2020-10-20 MED ORDER — MECLIZINE HCL 25 MG PO TABS
12.5000 mg | ORAL_TABLET | Freq: Once | ORAL | Status: AC
  Administered 2020-10-20: 12.5 mg via ORAL
  Filled 2020-10-20: qty 1

## 2020-10-20 MED ORDER — SODIUM CHLORIDE 0.9 % IV BOLUS
500.0000 mL | Freq: Once | INTRAVENOUS | Status: AC
  Administered 2020-10-20: 500 mL via INTRAVENOUS

## 2020-10-20 NOTE — ED Notes (Signed)
Patient transported to MRI 

## 2020-10-20 NOTE — ED Notes (Signed)
Discharge instructions including follow up care discussed with pt. Pt verbalized understanding with no questions at this time. Pt to go home with friend at bedside.

## 2020-10-20 NOTE — Discharge Instructions (Addendum)
Go to the Emergency Department for evaluation of your EKG changes, chest pain, dizziness, headache, numbness of hands, generalized weakness.

## 2020-10-20 NOTE — ED Provider Notes (Signed)
[facility] EMERGENCY DEPARTMENT Provider Note   CSN: [id number] Arrival date & time: 10/20/20  1132     History No chief complaint on file.   Katherine Haynes is a 70 y.o. female.  He was seen at urgent care today and sent over for multiple complaints.  She said for a week she has had an on-and-off headache that sometimes is in the right side of her neck and sometimes generalizes.  She is also noticed dizziness which she describes as both being lightheaded and also spinning.  She is also noticed some tingling in her fingers that come and go.  Has generalized fatigue and gets tired just ambulating in the house.  Today she is noticed some vague left-sided chest discomfort that she does not call pain.  That also comes and goes.  Her EKG at urgent care showed a left bundle branch block and so the recommended she be seen in the emergency department.  The history is provided by the patient.  Dizziness Quality:  Lightheadedness and room spinning Severity:  Moderate Onset quality:  Gradual Duration:  1 week Timing:  Intermittent Progression:  Unchanged Chronicity:  New Relieved by:  None tried Worsened by:  Nothing Ineffective treatments:  None tried Associated symptoms: chest pain and headaches   Associated symptoms: no nausea, no shortness of breath and no vomiting        Past Medical History:  Diagnosis Date  . Hyperlipidemia   . Hypertension   . Menopause syndrome     Patient Active Problem List   Diagnosis Date Noted  . Vasomotor symptoms due to menopause 01/07/2019  . Edema of both legs 12/11/2018  . Status post cholecystectomy 05/01/2017  . Healthcare maintenance 04/25/2017  . Seasonal allergies 10/07/2010  . Hyperlipidemia 05/14/2007  . Essential hypertension 05/25/2006    Past Surgical History:  Procedure Laterality Date  . CHOLECYSTECTOMY N/A 04/05/2017   Procedure: LAPAROSCOPIC CHOLECYSTECTOMY;  Surgeon: Kinsinger, Arta Bruce, MD;  Location: [facility]  [facility];  Service: General;  Laterality: N/A;  . MOUTH SURGERY    . TONSILLECTOMY    . TUBAL LIGATION       OB History    Gravida  3   Para      Term      Preterm      AB      Living  3     SAB      IAB      Ectopic      Multiple      Live Births  3           Family History  Problem Relation Age of Onset  . Heart disease Mother        "enlarged heart"  . Hypertension Mother   . Diabetes Mother   . Heart disease Maternal Uncle        Bypass surg  . Heart attack Maternal Grandmother        also had irregular heart beat requriring a pacemaker.  . Hypertension Other        In multiple relatives.   . Hypertension Paternal Grandmother   . COPD Sister   . Sarcoidosis Sister   . Hypertension Brother   . Hypertension Sister   . Arthritis Sister   . COPD Sister   . Diabetes Sister   . Hypertension Sister   . Arthritis Sister   . Hypertension Brother   . Breast cancer Neg Hx     Social History  Tobacco Use  . Smoking status: Former Research scientist (life sciences)  . Smokeless tobacco: Never Used  . Tobacco comment:  1 PPD for 2-3 years. Quit ~0354.  Vaping Use  . Vaping Use: Never used  Substance Use Topics  . Alcohol use: No  . Drug use: No    Home Medications Prior to Admission medications   Medication Sig Start Date End Date Taking? Authorizing Provider  amLODipine (NORVASC) 10 MG tablet TAKE 1 TABLET(10 MG) BY MOUTH DAILY 06/10/20   Velna Ochs, MD  atorvastatin (LIPITOR) 40 MG tablet TAKE 1 TABLET(40 MG) BY MOUTH DAILY 06/10/20   Velna Ochs, MD  furosemide (LASIX) 20 MG tablet Take 3 tablets (60 mg total) by mouth daily. 07/20/20 07/20/21  Velna Ochs, MD  gabapentin (NEURONTIN) 300 MG capsule Take 2 capsules (600 mg total) by mouth at bedtime. 06/10/20   Velna Ochs, MD  metoprolol tartrate (LOPRESSOR) 25 MG tablet Take 1 tablet (25 mg total) by mouth 2 (two) times daily. 06/10/20   Velna Ochs, MD    Allergies    Penicillins and  Sulfonamide derivatives  Review of Systems   Review of Systems  Constitutional: Positive for fever.  HENT: Negative for sore throat.   Eyes: Negative for visual disturbance.  Respiratory: Negative for shortness of breath.   Cardiovascular: Positive for chest pain.  Gastrointestinal: Negative for abdominal pain, nausea and vomiting.  Genitourinary: Negative for dysuria.  Musculoskeletal: Positive for neck pain.  Skin: Negative for rash.  Neurological: Positive for dizziness, light-headedness, numbness and headaches.    Physical Exam Updated Vital Signs BP (!) 146/69   Pulse 62   Temp 97.8 F (36.6 C) (Oral)   Resp 19   LMP 10/07/1990   SpO2 97%   Physical Exam Vitals and nursing note reviewed.  Constitutional:      General: She is not in acute distress.    Appearance: Normal appearance. She is well-developed.  HENT:     Head: Normocephalic and atraumatic.  Eyes:     Conjunctiva/sclera: Conjunctivae normal.  Cardiovascular:     Rate and Rhythm: Normal rate and regular rhythm.     Heart sounds: No murmur heard.   Pulmonary:     Effort: Pulmonary effort is normal. No respiratory distress.     Breath sounds: Normal breath sounds.  Abdominal:     Palpations: Abdomen is soft.     Tenderness: There is no abdominal tenderness.  Musculoskeletal:        General: No deformity or signs of injury. Normal range of motion.     Cervical back: Neck supple.  Skin:    General: Skin is warm and dry.     Capillary Refill: Capillary refill takes less than 2 seconds.  Neurological:     General: No focal deficit present.     Mental Status: She is alert and oriented to person, place, and time.     Cranial Nerves: No cranial nerve deficit.     Sensory: No sensory deficit.     Motor: No weakness.     ED Results / Procedures / Treatments   Labs (all labs ordered are listed, but only abnormal results are displayed) Labs Reviewed  BASIC METABOLIC PANEL - Abnormal; Notable for the  following components:      Result Value   Potassium 3.3 (*)    Glucose, Bld 118 (*)    All other components within normal limits  CBC WITH DIFFERENTIAL/PLATELET - Abnormal; Notable for the following components:   Hemoglobin 15.4 (*)  All other components within normal limits  URINALYSIS, ROUTINE W REFLEX MICROSCOPIC - Abnormal; Notable for the following components:   APPearance HAZY (*)    Hgb urine dipstick SMALL (*)    All other components within normal limits  MAGNESIUM  TROPONIN I (HIGH SENSITIVITY)  TROPONIN I (HIGH SENSITIVITY)    EKG EKG Interpretation  Date/Time:  Tuesday October 20 2020 11:40:21 EDT Ventricular Rate:  63 PR Interval:  145 QRS Duration: 139 QT Interval:  458 QTC Calculation: 469 R Axis:   74 Text Interpretation: Sinus rhythm IVCD, consider atypical LBBB new LBBB since prior 9/18 Confirmed by Aletta Edouard [phone]) on 10/20/2020 11:43:05 AM   Radiology MR BRAIN WO CONTRAST  Result Date: 10/20/2020 CLINICAL DATA:  Dizziness EXAM: MRI HEAD WITHOUT CONTRAST TECHNIQUE: Multiplanar, multiecho pulse sequences of the brain and surrounding structures were obtained without intravenous contrast. COMPARISON:  CT head 07/04/2011 FINDINGS: Brain: No acute infarction, hemorrhage, hydrocephalus, extra-axial collection or mass lesion. Minimal white matter changes with several small white matter hyperintensities bilaterally. Normal brainstem and cerebellum. Vascular: Normal arterial flow voids. Skull and upper cervical spine: Negative Sinuses/Orbits: Paranasal sinuses clear.  Negative orbit Other: None IMPRESSION: Negative MRI head. Electronically Signed   By: Franchot Gallo M.D.   On: 10/20/2020 17:39    Procedures Procedures   Medications Ordered in ED Medications  meclizine (ANTIVERT) tablet 12.5 mg (12.5 mg Oral Given 10/20/20 1250)  sodium chloride 0.9 % bolus 500 mL (0 mLs Intravenous Stopped 10/20/20 1403)    ED Course  I have reviewed the triage vital signs and  the nursing notes.  Pertinent labs & imaging results that were available during my care of the patient were reviewed by me and considered in my medical decision making (see chart for details).  Clinical Course as of 10/20/20 1907  Tue Oct 20, 2020  1143 I tried to order the patient a CTA head and neck.  Ultimately the techs found out that she had an allergy to contrast although is not listed in her allergies.  Have ordered her MRI brain. [MB]  1626 Patient's care signed out to oncoming physician Dr. Francia Greaves to follow-up on results of MRI.  Likely can discharge if no acute findings [MB]    Clinical Course User Index [MB] Hayden Rasmussen, MD   MDM Rules/Calculators/A&P                         This patient complains of dizziness lightheadedness, fatigue, chest pain; this involves an extensive number of treatment Options and is a complaint that carries with it a high risk of complications and Morbidity. The differential includes ACS, vascular, stroke vertigo, dehydration metabolic derangement  I ordered, reviewed and interpreted labs, which included CBC with normal white count normal hemoglobin, chemistries fairly normal urinalysis without signs of infection, troponin unremarkable I ordered medication IV fluids and oral meclizine I ordered imaging studies which included brain MRI and this is pending at time of signout Additional history obtained from patient's family member Previous records obtained and reviewed in epic including urgent care visit today  After the interventions stated above, I reevaluated the patient and found patient to be minimally symptomatic.  Vital signs remained stable.  Her care is signed out to oncoming provider Dr. Francia Greaves to follow-up on MRI results.  Likely can be discharged to follow-up with her PCP if no acute findings.   Final Clinical Impression(s) / ED Diagnoses Final diagnoses:  Dizziness  Nonspecific  chest pain  Paresthesia    Rx / DC Orders ED  Discharge Orders    None       Hayden Rasmussen, MD 10/20/20 1909

## 2020-10-20 NOTE — ED Triage Notes (Signed)
Pt presents with dizziness, headaches, and numbness in hands since Wednesday.

## 2020-10-20 NOTE — ED Triage Notes (Signed)
Pt here POV from UC with abnormal EKG. Pt states she went to be seen about headaches, fatigue, near syncope and numbness in bil hands X1 week. Pt states UC told her she had significant changes in her EKG and needed to come here. Pt also reports some intermittent chest discomfort.

## 2020-10-20 NOTE — Discharge Instructions (Addendum)
You were seen in the emergency department for dizziness and lightheadedness, chest pain, and numbness in your hands.  You had blood work EKG and an MRI of your brain that did not show any obvious explanation for your symptoms.  Please contact your primary care doctor for close follow-up.  Stay well-hydrated.  Return to the emergency department if any worsening or concerning symptoms.

## 2020-10-20 NOTE — ED Provider Notes (Signed)
Patient seen after prior ED provider.  Patient's work-up without evidence of significant acute pathology.  Patient feels improved.  Patient to be appropriate for discharge at this time.  Patient does understand need for close follow-up.  Strict return precautions given and understood.  Importance of close follow-up is repeatedly stressed.   Valarie Merino, MD 10/20/20 [phone]

## 2020-10-20 NOTE — ED Notes (Signed)
Patient is being discharged from the Urgent Care and sent to the Emergency Department via personal vehicle . Per provider Barkley Boards, patient is in need of higher level of care due to EKG changes. Patient is aware and verbalizes understanding of plan of care.   Vitals:   10/20/20 1036  BP: 120/68  Pulse: 75  Resp: 17  Temp: 98.9 F (37.2 C)  SpO2: 99%

## 2020-10-20 NOTE — ED Provider Notes (Signed)
[place]    CSN: [id number] Arrival date & time: 10/20/20  1856      History   Chief Complaint Chief Complaint  Patient presents with  . Headache  . Dizziness    HPI Katherine Haynes is a 70 y.o. female.   Patient presents with 6-day history of dizziness, headaches, generalized weakness, numbness in both hands.  She also reports "discomfort" in her left lower chest intermittently; none currently.  No falls or injury.  Her symptoms are intermittent.  She describes the dizziness as the "room spinning"; this is worse with lying down but resolves spontaneously after lying there for 1 minute.  Her headaches improved with Tylenol.  She denies focal weakness, shortness of breath, or other symptoms.  Her medical history includes hypertension, edema of both legs, seasonal allergies, and hyperlipidemia  The history is provided by the patient and medical records.    Past Medical History:  Diagnosis Date  . Hyperlipidemia   . Hypertension   . Menopause syndrome     Patient Active Problem List   Diagnosis Date Noted  . Vasomotor symptoms due to menopause 01/07/2019  . Edema of both legs 12/11/2018  . Status post cholecystectomy 05/01/2017  . Healthcare maintenance 04/25/2017  . Seasonal allergies 10/07/2010  . Hyperlipidemia 05/14/2007  . Essential hypertension 05/25/2006    Past Surgical History:  Procedure Laterality Date  . CHOLECYSTECTOMY N/A 04/05/2017   Procedure: LAPAROSCOPIC CHOLECYSTECTOMY;  Surgeon: Kinsinger, Arta Bruce, MD;  Location: [facility];  Service: General;  Laterality: N/A;  . MOUTH SURGERY    . TONSILLECTOMY    . TUBAL LIGATION      OB History    Gravida  3   Para      Term      Preterm      AB      Living  3     SAB      IAB      Ectopic      Multiple      Live Births  3            Home Medications    Prior to Admission medications   Medication Sig Start Date End Date Taking? Authorizing Provider  amLODipine  (NORVASC) 10 MG tablet TAKE 1 TABLET(10 MG) BY MOUTH DAILY 06/10/20   Velna Ochs, MD  atorvastatin (LIPITOR) 40 MG tablet TAKE 1 TABLET(40 MG) BY MOUTH DAILY 06/10/20   Velna Ochs, MD  furosemide (LASIX) 20 MG tablet Take 3 tablets (60 mg total) by mouth daily. 07/20/20 07/20/21  Velna Ochs, MD  gabapentin (NEURONTIN) 300 MG capsule Take 2 capsules (600 mg total) by mouth at bedtime. 06/10/20   Velna Ochs, MD  metoprolol tartrate (LOPRESSOR) 25 MG tablet Take 1 tablet (25 mg total) by mouth 2 (two) times daily. 06/10/20   Velna Ochs, MD    Family History Family History  Problem Relation Age of Onset  . Heart disease Mother        "enlarged heart"  . Hypertension Mother   . Diabetes Mother   . Heart disease Maternal Uncle        Bypass surg  . Heart attack Maternal Grandmother        also had irregular heart beat requriring a pacemaker.  . Hypertension Other        In multiple relatives.   . Hypertension Paternal Grandmother   . COPD Sister   . Sarcoidosis Sister   . Hypertension Brother   .  Hypertension Sister   . Arthritis Sister   . COPD Sister   . Diabetes Sister   . Hypertension Sister   . Arthritis Sister   . Hypertension Brother   . Breast cancer Neg Hx     Social History Social History   Tobacco Use  . Smoking status: Former Research scientist (life sciences)  . Smokeless tobacco: Never Used  . Tobacco comment:  1 PPD for 2-3 years. Quit ~8921.  Vaping Use  . Vaping Use: Never used  Substance Use Topics  . Alcohol use: No  . Drug use: No     Allergies   Penicillins and Sulfonamide derivatives   Review of Systems Review of Systems  Constitutional: Negative for chills and fever.  HENT: Negative for ear pain and sore throat.   Eyes: Negative for pain and visual disturbance.  Respiratory: Negative for cough and shortness of breath.   Cardiovascular: Positive for chest pain. Negative for palpitations.  Gastrointestinal: Negative for abdominal pain and  vomiting.  Genitourinary: Negative for dysuria and hematuria.  Musculoskeletal: Negative for arthralgias and back pain.  Skin: Negative for color change and rash.  Neurological: Positive for dizziness, weakness, numbness and headaches. Negative for seizures, syncope, facial asymmetry and speech difficulty.  All other systems reviewed and are negative.    Physical Exam Triage Vital Signs ED Triage Vitals  Enc Vitals Group     BP      Pulse      Resp      Temp      Temp src      SpO2      Weight      Height      Head Circumference      Peak Flow      Pain Score      Pain Loc      Pain Edu?      Excl. in [place]?    No data found.  Updated Vital Signs BP 120/68 (BP Location: Left Arm)   Pulse 75   Temp 98.9 F (37.2 C) (Oral)   Resp 17   LMP 10/07/1990   SpO2 99%   Visual Acuity Right Eye Distance:   Left Eye Distance:   Bilateral Distance:    Right Eye Near:   Left Eye Near:    Bilateral Near:     Physical Exam Vitals and nursing note reviewed.  Constitutional:      General: She is not in acute distress.    Appearance: She is well-developed. She is not ill-appearing.  HENT:     Head: Normocephalic and atraumatic.     Mouth/Throat:     Mouth: Mucous membranes are moist.  Eyes:     Conjunctiva/sclera: Conjunctivae normal.  Cardiovascular:     Rate and Rhythm: Normal rate and regular rhythm.     Heart sounds: Normal heart sounds.  Pulmonary:     Effort: Pulmonary effort is normal. No respiratory distress.     Breath sounds: Normal breath sounds.  Abdominal:     Palpations: Abdomen is soft.     Tenderness: There is no abdominal tenderness.  Musculoskeletal:     Cervical back: Neck supple.     Right lower leg: No edema.     Left lower leg: No edema.  Skin:    General: Skin is warm and dry.  Neurological:     General: No focal deficit present.     Mental Status: She is alert and oriented to person, place, and time.  Cranial Nerves: No cranial nerve  deficit.     Sensory: No sensory deficit.     Motor: No weakness.     Coordination: Romberg sign negative.     Gait: Gait normal.  Psychiatric:        Mood and Affect: Mood normal.        Behavior: Behavior normal.      UC Treatments / Results  Labs (all labs ordered are listed, but only abnormal results are displayed) Labs Reviewed - No data to display  EKG   Radiology No results found.  Procedures Procedures (including critical care time)  Medications Ordered in UC Medications - No data to display  Initial Impression / Assessment and Plan / UC Course  I have reviewed the triage vital signs and the nursing notes.  Pertinent labs & imaging results that were available during my care of the patient were reviewed by me and considered in my medical decision making (see chart for details).   New onset left bundle branch block, chest pain, dizziness, headache, numbness in hands, generalized weakness.  EKG shows sinus rhythm, rate 67, new left bundle branch block when compared to previous from 2018.  Discussed and reviewed EKG with Dr. Mannie Stabile.  Based on the patient's symptoms and EKG changes, sending her to the ED for evaluation.  Patient declines EMS.  She states she feels stable to transport herself.  She is not currently having chest pain or other symptoms but just feels generally weak.  Vital signs are stable.   Final Clinical Impressions(s) / UC Diagnoses   Final diagnoses:  New onset left bundle branch block (LBBB)  Chest pain, unspecified type  Dizziness  Acute nonintractable headache, unspecified headache type  Numbness of fingers of both hands  Generalized weakness     Discharge Instructions     Go to the Emergency Department for evaluation of your EKG changes, chest pain, dizziness, headache, numbness of hands, generalized weakness.        ED Prescriptions    None     PDMP not reviewed this encounter.   Sharion Balloon, NP 10/20/20 1133

## 2020-10-21 ENCOUNTER — Encounter: Payer: Medicare Other | Admitting: Internal Medicine

## 2020-10-21 ENCOUNTER — Telehealth: Payer: Self-pay | Admitting: *Deleted

## 2020-10-21 NOTE — Telephone Encounter (Signed)
Spoke with pt regarding her missed appt. Pt was instructed to call the office at [phone] to reschedule her appt.

## 2020-10-26 ENCOUNTER — Other Ambulatory Visit: Payer: Self-pay

## 2020-10-26 ENCOUNTER — Ambulatory Visit: Payer: Medicare Other | Admitting: Cardiology

## 2020-10-26 ENCOUNTER — Encounter: Payer: Self-pay | Admitting: Cardiology

## 2020-10-26 VITALS — BP 114/66 | HR 62 | Ht 66.0 in | Wt 152.0 lb

## 2020-10-26 DIAGNOSIS — R079 Chest pain, unspecified: Secondary | ICD-10-CM

## 2020-10-26 DIAGNOSIS — R600 Localized edema: Secondary | ICD-10-CM

## 2020-10-26 DIAGNOSIS — I447 Left bundle-branch block, unspecified: Secondary | ICD-10-CM

## 2020-10-26 DIAGNOSIS — R9431 Abnormal electrocardiogram [ECG] [EKG]: Secondary | ICD-10-CM | POA: Diagnosis not present

## 2020-10-26 DIAGNOSIS — E782 Mixed hyperlipidemia: Secondary | ICD-10-CM | POA: Diagnosis not present

## 2020-10-26 DIAGNOSIS — I10 Essential (primary) hypertension: Secondary | ICD-10-CM

## 2020-10-26 HISTORY — DX: Left bundle-branch block, unspecified: I44.7

## 2020-10-26 NOTE — Patient Instructions (Addendum)
Medication Instructions:  Once you have your echocardiogram done, wean off metoprolol. Take a half a tablet (which equals 12.5mg ) twice a day for a week and then stop.  *If you need a refill on your cardiac medications before your next appointment, please call your pharmacy*   Lab Work: None ordered   Testing/Procedures:  Echo will be scheduled at [facility]. Your physician has requested that you have an echocardiogram. Echocardiography is a painless test that uses sound waves to create images of your heart. It provides your doctor with information about the size and shape of your heart and how well your heart's chambers and valves are working. This procedure takes approximately one hour. There are no restrictions for this procedure.   Will be scheduled at [place].   You will need a COVID-19  test 3 DAYS prior to your procedure AND SELF ISOLATE [place] . This is a Drive Up Visit at [address] [place], [place] [address]. Someone will direct you to the appropriate testing line. Stay in your car and someone will be with you shortly. Your physician has requested that you have en exercise stress myoview. Please follow instruction sheet, as given. (You will be off metoprolol by this time).      Follow-Up: At [facility], you and your health needs are our priority.  As part of our continuing mission to provide you with exceptional heart care, we have created designated Provider Care Teams.  These Care Teams include your primary Cardiologist (physician) and Advanced Practice Providers (APPs -  Physician Assistants and Nurse Practitioners) who all work together to provide you with the care you need, when you need it.   Your next appointment:   1 month(s)  The format for your next appointment:   In Person  Provider:   Glenetta Hew, MD   Other:  Left Bundle Branch Block  Left bundle branch block (LBBB) is a problem  with the way that electrical impulses pass through the heart (electrical conduction abnormality). The heart depends on an electrical pulse to beat normally. The electrical signal for a heartbeat starts in the upper chambers of the heart (atria) and then travels to the two lower chambers (left and rightventricles). An LBBB is a partial or complete block of the pathway that carries the signal to the left ventricle. If you have LBBB, the left side of your heart does not beat normally. LBBB may be a warning sign of heart disease. What are the causes? This condition may be caused by:  Heart disease.  Disease of the arteries in the heart (arteriosclerosis).  Stiffening or weakening of heart muscle (cardiomyopathy).  Infection of heart muscle (myocarditis).  High blood pressure. In some cases, the cause may not be known. What increases the risk? The following factors may make you more likely to develop this condition:  Being female.  Being 58 years of age or older.  Having heart disease.  Having had a heart attack or heart surgery. What are the signs or symptoms? This condition may not cause any symptoms. If you do have symptoms, they may include:  Feeling dizzy or light-headed.  Fainting. How is this diagnosed? This condition may be diagnosed based on an electrocardiogram (ECG). It is often diagnosed when an ECG is done as part of a routine physical or to help find the cause of fainting spells. You may also have imaging tests to find out more about your condition.  These may include:  Chest X-rays.  Echocardiogram. How is this treated? If you do not have symptoms or any other type of heart disease, you may not need treatment for this condition. However, you may need to see your health care provider more often because LBBB can be a warning sign of future heart problems. You may get treatment for other heart problems or high blood pressure. If LBBB causes symptoms or other heart  problems, you may need to have an electrical device (pacemaker) implanted under the skin of your chest. A pacemaker sends electrical signals to your heart to keep it beating normally. Follow these instructions at home: Lifestyle  Follow instructions from your health care provider about eating or drinking restrictions.  Follow a heart-healthy diet and maintain a healthy weight. Work with a dietitian to create an eating plan that is best for you.  Do not use any products that contain nicotine or tobacco, such as cigarettes, e-cigarettes, and chewing tobacco. If you need help quitting, ask your health care provider.   Activity  Get regular exercise as told by your health care provider.  Return to your normal activities as told by your health care provider. Ask your health care provider what activities are safe for you. General instructions  Take over-the-counter and prescription medicines only as told by your health care provider.  Keep all follow-up visits as told by your health care provider. This is important. Contact a health care provider if:  You feel light-headed.  You faint. Get help right away if:  You have chest pain.  You have trouble breathing. These symptoms may represent a serious problem that is an emergency. Do not wait to see if the symptoms will go away. Get medical help right away. Call your local emergency services (911 in the [place] Do not drive yourself to the hospital. Summary  For the heart to beat normally, an electrical signal must travel to the lower left chamber of the heart. Left bundle branch block (LBBB) is a partial or complete block of the pathway that carries that signal.  This condition may not cause any symptoms. In some cases, a person may feel dizzy or light-headed or may faint.  Treatment may not be needed for LBBB if you do not have symptoms or any other type of heart disease.  You may need to see your health care provider more often because  LBBB can be a warning sign of future heart problems. This information is not intended to replace advice given to you by your health care provider. Make sure you discuss any questions you have with your health care provider. Document Revised: 10/23/2019 Document Reviewed: 01/01/2019 Elsevier Patient Education  [place].

## 2020-10-26 NOTE — Progress Notes (Signed)
Primary Care Provider: Velna Ochs, MD Cardiologist: Glenetta Hew, MD Electrophysiologist: None  Clinic Note: Chief Complaint  Patient presents with  . New Patient (Initial Visit)  . Headache  . Chest Pain    Discomfort and pressure.  . Shortness of Breath  . Edema    Ankles all the time.    ===================================  ASSESSMENT/PLAN   Problem List Items Addressed This Visit    LBBB (left bundle branch block) - Primary    Unusual that she has a left bundle branch block with bradycardia as opposed FFR heart rate related left bundle branch block.  This quite likely related to some type of conduction abnormality, however cannot exclude ischemic etiology given the fact that she is having some chest discomfort.  It is quite possible that she is having chest comfort because of the bundle-branch block. Need to exclude structural abnormality and ischemia.  Plan: 2D echocardiogram and Lexiscan Myoview.  Wean off beta-blocker to avoid bradycardia related LBBB.  Shared Decision Making/Informed Consent The risks [chest pain, shortness of breath, cardiac arrhythmias, dizziness, blood pressure fluctuations, myocardial infarction, stroke/transient ischemic attack, nausea, vomiting, allergic reaction, radiation exposure, metallic taste sensation and life-threatening complications (estimated to be 1 in 10,000)], benefits (risk stratification, diagnosing coronary artery disease, treatment guidance) and alternatives of a nuclear stress test were discussed in detail with Katherine Haynes and she agrees to proceed.      Relevant Orders   EKG 12-Lead (Completed)   Cardiac Stress Test: Informed Consent Details: Physician/Practitioner Attestation; Transcribe to consent form and obtain patient signature   ECHOCARDIOGRAM COMPLETE   Chest pain with moderate risk for cardiac etiology    It seems that her chest discomfort is related to when she is in a Dr. Quay Burow spot.  Point by  time she got to the emergency room, her symptoms have essentially resolved as had her left bundle branch block.  This would argue that potentially losing synchronized LV function leading to acute decrease in contractility and EF may be what is causing her symptoms.  Exclude CAD with Myoview.  We will use a Treadmill Myoview in order to see spots and effect on LBBB.      Relevant Orders   EKG 12-Lead (Completed)   Cardiac Stress Test: Informed Consent Details: Physician/Practitioner Attestation; Transcribe to consent form and obtain patient signature   ECHOCARDIOGRAM COMPLETE   MYOCARDIAL PERFUSION IMAGING   Hyperlipidemia (Chronic)    Which had up evaluated since 2020.  She remains on atorvastatin 40 mg.  She is due to get labs checked hopefully soon by her PCP.  For now continue current dose of statin.  Depending on results of her biopsy, may need to be more aggressive.      Relevant Orders   EKG 12-Lead (Completed)   Essential hypertension (Chronic)    Blood pressures well controlled on amlodipine and metoprolol.  However with heart rate related LBBB.  My plan will be to actually have her come off of metoprolol.  Plan: Continue amlodipine,  Continue beta-blocker until after her echocardiogram is done.  Following that she will wean off metoprolol by taking 1/2 tablet twice daily for a week then stop.  Anticipate that we may need to use something else for blood pressure, but is still on a decent dose of amlodipine.      Relevant Orders   EKG 12-Lead (Completed)   Cardiac Stress Test: Informed Consent Details: Physician/Practitioner Attestation; Transcribe to consent form and obtain patient signature  Edema of both legs (Chronic)    Chronic, likely related to venous stasis or lymphedema.  Has had evaluation in the past as well as using compression stockings and taking on standing dose of furosemide.  Question if some of this could be related to the amlodipine. She has had issues  with ACE inhibitor's and cough and then volume related orthostatic hypotension with HCTZ.  I think another option would be ARB.  Rechecking 2D echocardiogram, need to exclude cardiomyopathy with diagnosis of LBBB.       Other Visit Diagnoses    Abnormal electrocardiogram       Relevant Orders   ECHOCARDIOGRAM COMPLETE   MYOCARDIAL PERFUSION IMAGING     ===================================  HPI:    Katherine Haynes is a 70 y.o. female with a PMH notable for Hypertension, Hyperlipidemia, and Chronic Lower Extremity Edema (on furosemide and wearing compression stockings) who is being seen today for the evaluation of CHEST PAIN and LBBB at the request of Velna Ochs, MD-in response to ER visit.  Katherine Haynes was referred in response to the recent ER visit for dizziness with new onset LBBB.  Recent Hospitalizations:   10/20/2020: [facility] => presented with 6-day history of dizziness, headache weakness and numbness in both hands.  Also noted "discomfort in the left lower chest intermittently.  Felt the room spinning.  Worse with lying down.  Resolve spontaneously.  Symptom PRN because of the bundle-branch block seen on EKG. => (New compared to 2018).  Refer to emergency room ER: Was noted to have subjective fever  In the ER, was given meclizine for dizziness.  To consider doing a CT of the head, converted to MRI head/brain.  (Normal MRI)  Troponin negative x2.  EKG done in ER no longer showed LBBB - rate was faster (borderline Inc LBBB).  Reviewed  CV studies:    The following studies were reviewed today: (if available, images/films reviewed: From Epic Chart or Care Everywhere) . 2D Echo 04/19/2019: EF 66 5%.  Mild LVH.  GR 1 DD.  Normal atrial sizes.  Mild aortic valve sclerosis but no stenosis.  Normal IVC.  Otherwise normal valves.   Interval History:   Katherine Haynes presents here today for cardiology evaluation after her ER visit.  She says that she still has  shortness of breath worse with certain.  She also has mild orthopnea, sleeping on 2 was +1 pillow.  No PND, Most notable symptoms are dizziness and fatigue with no energy level.  Exertional dyspnea with occasional lower chest tightness associated with numbness and tingling in the hands.  The spells are actually coming.  Cannot happen with exertion, but can also happen at rest.  Denies any near-syncope or syncopal type symptoms despite having the lightheadedness  Dizziness.  The dizziness seems more related to vertigo type situation complicated by dyspnea.  No tachycardia symptoms.  Actually it seems like once she is getting going with walking she feels better than if she is just started walking.  She started noticing similar symptoms today prior to getting this visit, she started feeling the dizziness and fatigue and some tightness in her chest.  Actually felt better as she was walking into the clinic, but got worse when she sat down.  CV Review of Symptoms (Summary) Cardiovascular ROS: positive for - chest pain, dyspnea on exertion, edema, orthopnea, shortness of breath and  dizziness and fatigue; chronic edema. negative for - irregular heartbeat, palpitations, paroxysmal nocturnal dyspnea, rapid heart rate  or Syncope/near syncope or TIA/amaurosis fugax, claudication  The patient does not have symptoms concerning for COVID-19 infection (fever, chills, cough, or new shortness of breath).   REVIEWED OF SYSTEMS   Review of Systems  Constitutional: Positive for malaise/fatigue (Intermittent.  Not all the time.). Negative for weight loss.  HENT: Negative for congestion and nosebleeds.   Respiratory: Positive for shortness of breath (Off-and-on). Negative for cough.   Cardiovascular: Positive for orthopnea (Occasional two-pillow) and leg swelling (Chronic).  Gastrointestinal: Positive for nausea. Negative for abdominal pain, blood in stool and melena.  Musculoskeletal: Negative for back pain, falls  and joint pain.  Neurological: Positive for dizziness, tingling (In hands and feet), weakness (When she feels of fatigue) and headaches. Negative for focal weakness.  Psychiatric/Behavioral: Negative for memory loss. The patient is not nervous/anxious and does not have insomnia.      I have reviewed and (if needed) personally updated the patient's problem list, medications, allergies, past medical and surgical history, social and family history.   PAST MEDICAL HISTORY   Past Medical History:  Diagnosis Date  . Hyperlipidemia   . Hypertension   . Menopause syndrome     PAST SURGICAL HISTORY   Past Surgical History:  Procedure Laterality Date  . CHOLECYSTECTOMY N/A 04/05/2017   Procedure: LAPAROSCOPIC CHOLECYSTECTOMY;  Surgeon: Kinsinger, Arta Bruce, MD;  Location: [facility];  Service: General;  Laterality: N/A;  . MOUTH SURGERY    . TONSILLECTOMY    . TRANSTHORACIC ECHOCARDIOGRAM  04/19/2019    EF 66 5%.  Mild LVH.  GR 1 DD.  Normal atrial sizes.  Mild aortic valve sclerosis but no stenosis.  Normal IVC.  Otherwise normal valves.  . TUBAL LIGATION      Immunization History  Administered Date(s) Administered  . Influenza Whole 05/14/2007, 06/23/2009, 06/23/2010, 03/17/2011  . Influenza,inj,Quad PF,6+ Mos 04/25/2017, 08/20/2018, 03/20/2019  . PFIZER(Purple Top)SARS-COV-2 Vaccination 09/07/2019, 10/01/2019, 05/30/2020  . PPD Test 09/24/2018, 10/06/2020  . Pneumococcal Conjugate-13 08/20/2018  . Tdap 10/26/2010    MEDICATIONS/ALLERGIES   Current Meds  Medication Sig  . amLODipine (NORVASC) 10 MG tablet TAKE 1 TABLET(10 MG) BY MOUTH DAILY  . atorvastatin (LIPITOR) 40 MG tablet TAKE 1 TABLET(40 MG) BY MOUTH DAILY  . furosemide (LASIX) 20 MG tablet Take 3 tablets (60 mg total) by mouth daily.  Marland Kitchen gabapentin (NEURONTIN) 300 MG capsule Take 2 capsules (600 mg total) by mouth at bedtime.  . [DISCONTINUED] metoprolol tartrate (LOPRESSOR) 25 MG tablet Take 1 tablet (25 mg total) by  mouth 2 (two) times daily.    Allergies  Allergen Reactions  . Contrast Media [Iodinated Diagnostic Agents] Itching  . Penicillins Hives  . Sulfonamide Derivatives Itching    SOCIAL HISTORY/FAMILY HISTORY   Reviewed in Epic:  Pertinent findings:  Social History   Tobacco Use  . Smoking status: Former Research scientist (life sciences)  . Smokeless tobacco: Never Used  . Tobacco comment:  1 PPD for 2-3 years. Quit ~0973.  Vaping Use  . Vaping Use: Never used  Substance Use Topics  . Alcohol use: No  . Drug use: No   Social History   Social History Narrative   Lives with husband. Good relationship, feels safe at home. Studying medical administration.       Current Social History 09/27/2018        Patient lives with spouse in an apartment on the second floor. There are 14 steps with handrails up to the entrance the patient uses.       Patient's  method of transportation is personal car.      The highest level of education was high school diploma.      The patient currently is employed as a Charity fundraiser in patient's homes.      Identified important Relationships are "My husband, my kids, my grands and great grands."       Pets : None       Interests / Fun: Watching TV, going to beach and park, cooking out with family.       Current Stressors: "finances, teenagers" (grandkids)       Religious / Personal Beliefs: Methodist, "I was raised to treat people the way you want to be treated.           Family History  Problem Relation Age of Onset  . Heart disease Mother        "enlarged heart"  . Hypertension Mother   . Diabetes Mother   . Heart disease Maternal Uncle        Bypass surg  . Heart attack Maternal Grandmother        also had irregular heart beat requriring a pacemaker.  . Hypertension Other        In multiple relatives.   . Hypertension Paternal Grandmother   . COPD Sister   . Sarcoidosis Sister   . Heart block Sister        s/p PPM  . Hypertension Brother   .  Hypertension Sister   . Arthritis Sister   . COPD Sister   . Diabetes Sister   . Hypertension Sister   . Arthritis Sister   . Hypertension Brother   . Healthy Brother   . Healthy Brother   . Healthy Sister   . Breast cancer Neg Hx    OBJCTIVE -PE, EKG, labs   Wt Readings from Last 3 Encounters:  10/26/20 152 lb (68.9 kg)  06/10/20 161 lb (73 kg)  01/01/20 167 lb 3.2 oz (75.8 kg)    Physical Exam: BP 114/66 (BP Location: Left Arm, Patient Position: Sitting, Cuff Size: Normal)   Pulse 62   Ht 5\' 6"  (1.676 m)   Wt 152 lb (68.9 kg)   LMP 10/07/1990   BMI 24.53 kg/m  Physical Exam Constitutional:      Appearance: She is well-developed and normal weight. She is not ill-appearing.  HENT:     Head: Normocephalic and atraumatic.  Neck:     Vascular: No carotid bruit or JVD.  Cardiovascular:     Rate and Rhythm: Normal rate and regular rhythm.  No extrasystoles are present.    Chest Wall: PMI is not displaced.     Pulses: Intact distal pulses.     Heart sounds: S1 normal. Heart sounds are distant. No S4 sounds.      Comments: Split S2 Pulmonary:     Effort: Pulmonary effort is normal. No respiratory distress.     Breath sounds: Normal breath sounds. No decreased breath sounds, wheezing or rales.  Chest:     Chest wall: No tenderness.  Abdominal:     General: Bowel sounds are normal. There is no distension.     Palpations: Abdomen is soft.     Comments: No HSM  Musculoskeletal:        General: No swelling.     Cervical back: Normal range of motion and neck supple.  Skin:    General: Skin is warm and dry.  Neurological:     Mental Status: She is alert.  Psychiatric:        Mood and Affect: Mood normal. Mood is not anxious or depressed.        Behavior: Behavior normal. Behavior is not agitated.        Cognition and Memory: Memory is not impaired.      Adult ECG Report  Rate: 62 ;  Rhythm: normal sinus rhythm and LBBB.  Normal axis, intervals and durations.;    Narrative Interpretation: EKG is similar to the EKG seen in urgent care as opposed to the EKG seen in the ER which was not LBBB.  Recent Labs: Reviewed.  No recent lipids available. Lab Results  Component Value Date   CHOL 116 03/20/2019   HDL 49 03/20/2019   LDLCALC 46 03/20/2019   TRIG 120 03/20/2019   CHOLHDL 2.4 03/20/2019   Lab Results  Component Value Date   CREATININE 0.94 10/20/2020   BUN 9 10/20/2020   NA 136 10/20/2020   K 3.3 (L) 10/20/2020   CL 102 10/20/2020   CO2 29 10/20/2020   CBC Latest Ref Rng & Units 10/20/2020 05/01/2017 04/04/2017  WBC 4.0 - 10.5 K/uL 5.9 6.5 10.3  Hemoglobin 12.0 - 15.0 g/dL 15.4(H) 14.4 15.4(H)  Hematocrit 36.0 - 46.0 % 45.7 43.2 44.8  Platelets 150 - 400 K/uL 277 291 323    Lab Results  Component Value Date   TSH 1.790 01/07/2019    ==================================================  COVID-19 Education: The signs and symptoms of COVID-19 were discussed with the patient and how to seek care for testing (follow up with PCP or arrange E-visit).   The importance of social distancing and COVID-19 vaccination was discussed today. The patient is practicing social distancing & Masking.   I spent a total of 70minutes with the patient spent in direct patient consultation.  Additional time spent with chart review  / charting (studies, outside notes, etc): 16 min Total Time: 66 min  Current medicines are reviewed at length with the patient today.  (+/- concerns) n/a  This visit occurred during the SARS-CoV-2 public health emergency.  Safety protocols were in place, including screening questions prior to the visit, additional usage of staff PPE, and extensive cleaning of exam room while observing appropriate contact time as indicated for disinfecting solutions.  Notice: This dictation was prepared with Dragon dictation along with smaller phrase technology. Any transcriptional errors that result from this process are unintentional and may not be  corrected upon review.  Patient Instructions / Medication Changes & Studies & Tests Ordered   Patient Instructions   Medication Instructions:  Once you have your echocardiogram done, wean off metoprolol. Take a half a tablet (which equals 12.5mg ) twice a day for a week and then stop.  *If you need a refill on your cardiac medications before your next appointment, please call your pharmacy*   Lab Work: None ordered   Testing/Procedures:  Echo will be scheduled at [facility]. Your physician has requested that you have an echocardiogram. Echocardiography is a painless test that uses sound waves to create images of your heart. It provides your doctor with information about the size and shape of your heart and how well your heart's chambers and valves are working. This procedure takes approximately one hour. There are no restrictions for this procedure.   Will be scheduled at [facility].   You will need a COVID-19  test 3 DAYS prior to your procedure AND SELF ISOLATE [place] . This  is a Drive Up Visit at [address] [place], [place] [address]. Someone will direct you to the appropriate testing line. Stay in your car and someone will be with you shortly. Your physician has requested that you have en exercise stress myoview. Please follow instruction sheet, as given. (You will be off metoprolol by this time).      Follow-Up: At [facility], you and your health needs are our priority.  As part of our continuing mission to provide you with exceptional heart care, we have created designated Provider Care Teams.  These Care Teams include your primary Cardiologist (physician) and Advanced Practice Providers (APPs -  Physician Assistants and Nurse Practitioners) who all work together to provide you with the care you need, when you need it.   Your next appointment:   1 month(s)  The format for your next appointment:   In  Person  Provider:   Glenetta Hew, MD   Other:  Left Bundle Branch Block  Left bundle branch block (LBBB) is a problem with the way that electrical impulses pass through the heart (electrical conduction abnormality). The heart depends on an electrical pulse to beat normally. The electrical signal for a heartbeat starts in the upper chambers of the heart (atria) and then travels to the two lower chambers (left and rightventricles). An LBBB is a partial or complete block of the pathway that carries the signal to the left ventricle. If you have LBBB, the left side of your heart does not beat normally. LBBB may be a warning sign of heart disease. What are the causes? This condition may be caused by:  Heart disease.  Disease of the arteries in the heart (arteriosclerosis).  Stiffening or weakening of heart muscle (cardiomyopathy).  Infection of heart muscle (myocarditis).  High blood pressure. In some cases, the cause may not be known. What increases the risk? The following factors may make you more likely to develop this condition:  Being female.  Being 36 years of age or older.  Having heart disease.  Having had a heart attack or heart surgery. What are the signs or symptoms? This condition may not cause any symptoms. If you do have symptoms, they may include:  Feeling dizzy or light-headed.  Fainting. How is this diagnosed? This condition may be diagnosed based on an electrocardiogram (ECG). It is often diagnosed when an ECG is done as part of a routine physical or to help find the cause of fainting spells. You may also have imaging tests to find out more about your condition. These may include:  Chest X-rays.  Echocardiogram. How is this treated? If you do not have symptoms or any other type of heart disease, you may not need treatment for this condition. However, you may need to see your health care provider more often because LBBB can be a warning sign of future heart  problems. You may get treatment for other heart problems or high blood pressure. If LBBB causes symptoms or other heart problems, you may need to have an electrical device (pacemaker) implanted under the skin of your chest. A pacemaker sends electrical signals to your heart to keep it beating normally. Follow these instructions at home: Lifestyle  Follow instructions from your health care provider about eating or drinking restrictions.  Follow a heart-healthy diet and maintain a healthy weight. Work with a dietitian to create an eating plan that is best for you.  Do not use any products that contain nicotine or tobacco, such as cigarettes,  e-cigarettes, and chewing tobacco. If you need help quitting, ask your health care provider.   Activity  Get regular exercise as told by your health care provider.  Return to your normal activities as told by your health care provider. Ask your health care provider what activities are safe for you. General instructions  Take over-the-counter and prescription medicines only as told by your health care provider.  Keep all follow-up visits as told by your health care provider. This is important. Contact a health care provider if:  You feel light-headed.  You faint. Get help right away if:  You have chest pain.  You have trouble breathing. These symptoms may represent a serious problem that is an emergency. Do not wait to see if the symptoms will go away. Get medical help right away. Call your local emergency services (911 in the [place] Do not drive yourself to the hospital. Summary  For the heart to beat normally, an electrical signal must travel to the lower left chamber of the heart. Left bundle branch block (LBBB) is a partial or complete block of the pathway that carries that signal.  This condition may not cause any symptoms. In some cases, a person may feel dizzy or light-headed or may faint.  Treatment may not be needed for LBBB if you do  not have symptoms or any other type of heart disease.  You may need to see your health care provider more often because LBBB can be a warning sign of future heart problems. This information is not intended to replace advice given to you by your health care provider. Make sure you discuss any questions you have with your health care provider. Document Revised: 10/23/2019 Document Reviewed: 01/01/2019 Elsevier Patient Education  [place].     Studies Ordered:   Orders Placed This Encounter  Procedures  . Cardiac Stress Test: Informed Consent Details: Physician/Practitioner Attestation; Transcribe to consent form and obtain patient signature  . MYOCARDIAL PERFUSION IMAGING  . EKG 12-Lead  . ECHOCARDIOGRAM COMPLETE     Glenetta Hew, M.D., M.S. Interventional Cardiologist   Pager # [phone] Phone # [phone] [address] [place], [place] [address]   Thank you for choosing Heartcare at [facility]!!

## 2020-10-30 ENCOUNTER — Encounter: Payer: Self-pay | Admitting: Cardiology

## 2020-10-30 NOTE — Assessment & Plan Note (Signed)
Which had up evaluated since 2020.  She remains on atorvastatin 40 mg.  She is due to get labs checked hopefully soon by her PCP.  For now continue current dose of statin.  Depending on results of her biopsy, may need to be more aggressive.

## 2020-10-30 NOTE — Assessment & Plan Note (Addendum)
Chronic, likely related to venous stasis or lymphedema.  Has had evaluation in the past as well as using compression stockings and taking on standing dose of furosemide.  Question if some of this could be related to the amlodipine. She has had issues with ACE inhibitor's and cough and then volume related orthostatic hypotension with HCTZ.  I think another option would be ARB.  Rechecking 2D echocardiogram, need to exclude cardiomyopathy with diagnosis of LBBB.

## 2020-10-30 NOTE — Assessment & Plan Note (Addendum)
Unusual that she has a left bundle branch block with bradycardia as opposed FFR heart rate related left bundle branch block.  This quite likely related to some type of conduction abnormality, however cannot exclude ischemic etiology given the fact that she is having some chest discomfort.  It is quite possible that she is having chest comfort because of the bundle-branch block. Need to exclude structural abnormality and ischemia.  Plan: 2D echocardiogram and Lexiscan Myoview.  Wean off beta-blocker to avoid bradycardia related LBBB.  Shared Decision Making/Informed Consent The risks [chest pain, shortness of breath, cardiac arrhythmias, dizziness, blood pressure fluctuations, myocardial infarction, stroke/transient ischemic attack, nausea, vomiting, allergic reaction, radiation exposure, metallic taste sensation and life-threatening complications (estimated to be 1 in 10,000)], benefits (risk stratification, diagnosing coronary artery disease, treatment guidance) and alternatives of a nuclear stress test were discussed in detail with Katherine Haynes and she agrees to proceed.

## 2020-10-30 NOTE — Assessment & Plan Note (Signed)
Blood pressures well controlled on amlodipine and metoprolol.  However with heart rate related LBBB.  My plan will be to actually have her come off of metoprolol.  Plan: Continue amlodipine,  Continue beta-blocker until after her echocardiogram is done.  Following that she will wean off metoprolol by taking 1/2 tablet twice daily for a week then stop.  Anticipate that we may need to use something else for blood pressure, but is still on a decent dose of amlodipine.

## 2020-10-30 NOTE — Assessment & Plan Note (Addendum)
It seems that her chest discomfort is related to when she is in a Dr. Quay Burow spot.  Point by time she got to the emergency room, her symptoms have essentially resolved as had her left bundle branch block.  This would argue that potentially losing synchronized LV function leading to acute decrease in contractility and EF may be what is causing her symptoms.  Exclude CAD with Myoview.  We will use a Treadmill Myoview in order to see spots and effect on LBBB.

## 2020-11-18 ENCOUNTER — Ambulatory Visit (INDEPENDENT_AMBULATORY_CARE_PROVIDER_SITE_OTHER): Payer: Medicare Other | Admitting: Internal Medicine

## 2020-11-18 ENCOUNTER — Encounter: Payer: Self-pay | Admitting: Internal Medicine

## 2020-11-18 ENCOUNTER — Other Ambulatory Visit: Payer: Self-pay

## 2020-11-18 VITALS — BP 120/71 | HR 59 | Temp 98.2°F | Ht 66.0 in | Wt 153.4 lb

## 2020-11-18 DIAGNOSIS — G959 Disease of spinal cord, unspecified: Secondary | ICD-10-CM | POA: Insufficient documentation

## 2020-11-18 DIAGNOSIS — M542 Cervicalgia: Secondary | ICD-10-CM | POA: Diagnosis not present

## 2020-11-18 DIAGNOSIS — N951 Menopausal and female climacteric states: Secondary | ICD-10-CM | POA: Diagnosis not present

## 2020-11-18 DIAGNOSIS — M5412 Radiculopathy, cervical region: Secondary | ICD-10-CM | POA: Insufficient documentation

## 2020-11-18 MED ORDER — NAPROXEN 500 MG PO TABS: 500.0000 mg | ORAL_TABLET | Freq: Two times a day (BID) | ORAL | 0 refills | Status: AC

## 2020-11-18 MED ORDER — METHOCARBAMOL 500 MG PO TABS: ORAL_TABLET | ORAL | 0 refills | Status: AC

## 2020-11-18 MED ORDER — VENLAFAXINE HCL ER 37.5 MG PO CP24: 37.5000 mg | ORAL_CAPSULE | Freq: Every day | ORAL | 2 refills | Status: DC

## 2020-11-18 NOTE — Assessment & Plan Note (Signed)
She has been experiencing neck pain for approximately 3 months. Her neck muscles feel tight and it is aggrivated with movement. She has experienced some bilaterally radiculopathy, but not present on exam today. She has not tried any interventions at home.   Assessment: No red flag signs on exam. Most likely cervical strain. Will treat with conservative therapy. Given age and radiculopathy cervical spondylosis and discogenic can be further worked up with imaging if not improving with therapy.  Cervical pain (neck) - methocarbamol (ROBAXIN) 500 MG tablet; Take 3 tablets (1,500 mg total) by mouth 4 (four) times daily for 2 days, THEN 2 tablets (1,000 mg total) 4 (four) times daily for 2 days, THEN 1 tablet (500 mg total) 4 (four) times daily for 2 days.  Dispense: 48 tablet; Refill: 0 - naproxen (NAPROSYN) 500 MG tablet; Take 1 tablet (500 mg total) by mouth 2 (two) times daily with a meal for 10 days.  Dispense: 20 tablet; Refill: 0

## 2020-11-18 NOTE — Progress Notes (Signed)
   CC: Cervical neck pain and vasomotor symptoms due to menopause   HPI:KatherineKatherine Haynes is a 70 y.o. female who presents for evaluation of cervical neck pain and vasomotor symptoms due to menopause  . Please see individual problem based A/P for details.  Past Medical History:  Diagnosis Date  . Hyperlipidemia   . Hypertension   . Menopause syndrome    Review of Systems:   Review of Systems  Constitutional: Negative for chills and fever.  Musculoskeletal: Positive for neck pain. Negative for falls.     Physical Exam: There were no vitals filed for this visit.   General: NAd, normal weight, well kept HEENT: Conjunctiva nl , antiicteric sclerae, moist mucous membranes, no exudate or erythema Cardiovascular: Normal rate, regular rhythm.  No murmurs, rubs, or gallops Pulmonary : Equal breath sounds, No wheezes, rales, or rhonchi Abdominal: soft, nontender,  bowel sounds present Ext: Trace edema in ankles , no tenderness to palpation of lower extremities.   Assessment & Plan:   See Encounters Tab for problem based charting.  Patient discussed with Dr. Evette Doffing

## 2020-11-18 NOTE — Patient Instructions (Signed)
Thank you for trusting me with your care. To recap, today we discussed the following:   1. Vasomotor symptoms due to menopause  - venlafaxine XR (EFFEXOR XR) 37.5 MG 24 hr capsule; Take 1 capsule (37.5 mg total) by mouth daily.  Dispense: 30 capsule; Refill: 2  2. Cervical pain (neck)  - methocarbamol (ROBAXIN) 500 MG tablet; Take 3 tablets (1,500 mg total) by mouth 4 (four) times daily for 2 days, THEN 2 tablets (1,000 mg total) 4 (four) times daily for 2 days, THEN 1 tablet (500 mg total) 4 (four) times daily for 2 days.  Dispense: 48 tablet; Refill: 0 - naproxen (NAPROSYN) 500 MG tablet; Take 1 tablet (500 mg total) by mouth 2 (two) times daily with a meal for 10 days.  Dispense: 20 tablet; Refill: 0

## 2020-11-18 NOTE — Assessment & Plan Note (Signed)
Patient continues to have hot flashes. She did not find any changes with increased dose of gabapentin. Will trial Venlafaxine to see if this improves symptoms. Stopping Gabapentin. She is not a candidate for phorone. replacement therapy at this time as she is undergoing workup of cardiovascular disease.   Vasomotor symptoms due to menopause - venlafaxine XR (EFFEXOR XR) 37.5 MG 24 hr capsule; Take 1 capsule (37.5 mg total) by mouth daily.  Dispense: 30 capsule; Refill: 2

## 2020-11-19 NOTE — Progress Notes (Signed)
Internal Medicine Clinic Attending  Case discussed with Dr. Steen  At the time of the visit.  We reviewed the resident's history and exam and pertinent patient test results.  I agree with the assessment, diagnosis, and plan of care documented in the resident's note.  

## 2020-11-23 ENCOUNTER — Emergency Department (HOSPITAL_BASED_OUTPATIENT_CLINIC_OR_DEPARTMENT_OTHER): Payer: Medicare Other

## 2020-11-23 ENCOUNTER — Emergency Department (HOSPITAL_COMMUNITY): Payer: Medicare Other

## 2020-11-23 ENCOUNTER — Other Ambulatory Visit: Payer: Self-pay

## 2020-11-23 ENCOUNTER — Observation Stay (HOSPITAL_COMMUNITY)
Admission: EM | Admit: 2020-11-23 | Discharge: 2020-11-24 | Disposition: A | Discharge status: Home / Self Care | Payer: Medicare Other | Attending: Cardiovascular Disease | Admitting: Cardiovascular Disease

## 2020-11-23 ENCOUNTER — Encounter (HOSPITAL_COMMUNITY): Payer: Self-pay

## 2020-11-23 DIAGNOSIS — Z20822 Contact with and (suspected) exposure to covid-19: Secondary | ICD-10-CM | POA: Insufficient documentation

## 2020-11-23 DIAGNOSIS — R0602 Shortness of breath: Secondary | ICD-10-CM | POA: Diagnosis not present

## 2020-11-23 DIAGNOSIS — Z79899 Other long term (current) drug therapy: Secondary | ICD-10-CM | POA: Insufficient documentation

## 2020-11-23 DIAGNOSIS — R079 Chest pain, unspecified: Secondary | ICD-10-CM | POA: Diagnosis not present

## 2020-11-23 DIAGNOSIS — R0789 Other chest pain: Secondary | ICD-10-CM | POA: Diagnosis not present

## 2020-11-23 DIAGNOSIS — I10 Essential (primary) hypertension: Secondary | ICD-10-CM | POA: Insufficient documentation

## 2020-11-23 DIAGNOSIS — I499 Cardiac arrhythmia, unspecified: Secondary | ICD-10-CM | POA: Diagnosis not present

## 2020-11-23 DIAGNOSIS — I447 Left bundle-branch block, unspecified: Secondary | ICD-10-CM | POA: Diagnosis not present

## 2020-11-23 DIAGNOSIS — Z87891 Personal history of nicotine dependence: Secondary | ICD-10-CM | POA: Diagnosis not present

## 2020-11-23 DIAGNOSIS — R072 Precordial pain: Secondary | ICD-10-CM

## 2020-11-23 DIAGNOSIS — R6889 Other general symptoms and signs: Secondary | ICD-10-CM | POA: Diagnosis not present

## 2020-11-23 DIAGNOSIS — Z743 Need for continuous supervision: Secondary | ICD-10-CM | POA: Diagnosis not present

## 2020-11-23 LAB — COMPREHENSIVE METABOLIC PANEL
ALT: 38 U/L (ref 0–44)
AST: 50 U/L — ABNORMAL HIGH (ref 15–41)
Albumin: 3.8 g/dL (ref 3.5–5.0)
Alkaline Phosphatase: 85 U/L (ref 38–126)
Anion gap: 9 (ref 5–15)
BUN: 13 mg/dL (ref 8–23)
CO2: 27 mmol/L (ref 22–32)
Calcium: 9 mg/dL (ref 8.9–10.3)
Chloride: 101 mmol/L (ref 98–111)
Creatinine, Ser: 0.94 mg/dL (ref 0.44–1.00)
GFR, Estimated: >60 mL/min (ref >60)
Glucose, Bld: 120 mg/dL — ABNORMAL HIGH (ref 70–99)
Potassium: 3.3 mmol/L — ABNORMAL LOW (ref 3.5–5.1)
Sodium: 137 mmol/L (ref 135–145)
Total Bilirubin: 1 mg/dL (ref 0.3–1.2)
Total Protein: 7.5 g/dL (ref 6.5–8.1)

## 2020-11-23 LAB — CBC WITH DIFFERENTIAL/PLATELET
Abs Immature Granulocytes: 0.01 10*3/uL (ref 0.00–0.07)
Basophils Absolute: 0.1 10*3/uL (ref 0.0–0.1)
Basophils Relative: 1 %
Eosinophils Absolute: 0.1 10*3/uL (ref 0.0–0.5)
Eosinophils Relative: 1 %
HCT: 43.5 % (ref 36.0–46.0)
Hemoglobin: 14.4 g/dL (ref 12.0–15.0)
Immature Granulocytes: 0 %
Lymphocytes Relative: 46 %
Lymphs Abs: 3.1 10*3/uL (ref 0.7–4.0)
MCH: 32.1 pg (ref 26.0–34.0)
MCHC: 33.1 g/dL (ref 30.0–36.0)
MCV: 96.9 fL (ref 80.0–100.0)
Monocytes Absolute: 0.5 10*3/uL (ref 0.1–1.0)
Monocytes Relative: 7 %
Neutro Abs: 3.1 10*3/uL (ref 1.7–7.7)
Neutrophils Relative %: 45 %
Platelets: 267 10*3/uL (ref 150–400)
RBC: 4.49 MIL/uL (ref 3.87–5.11)
RDW: 12.4 % (ref 11.5–15.5)
WBC: 6.9 10*3/uL (ref 4.0–10.5)
nRBC: 0 % (ref 0.0–0.2)

## 2020-11-23 LAB — CBC
HCT: 40.6 % (ref 36.0–46.0)
Hemoglobin: 13.8 g/dL (ref 12.0–15.0)
MCH: 32.2 pg (ref 26.0–34.0)
MCHC: 34 g/dL (ref 30.0–36.0)
MCV: 94.6 fL (ref 80.0–100.0)
Platelets: 250 10*3/uL (ref 150–400)
RBC: 4.29 MIL/uL (ref 3.87–5.11)
RDW: 12.3 % (ref 11.5–15.5)
WBC: 7.9 10*3/uL (ref 4.0–10.5)
nRBC: 0 % (ref 0.0–0.2)

## 2020-11-23 LAB — ECHOCARDIOGRAM COMPLETE
Area-P 1/2: 2.01 cm2
Calc EF: 55.4 %
Height: 66 in
S' Lateral: 2 cm
Single Plane A2C EF: 58.2 %
Single Plane A4C EF: 51.9 %
Weight: 2433.88 oz

## 2020-11-23 LAB — RESP PANEL BY RT-PCR (FLU A&B, COVID) ARPGX2
Influenza A by PCR: NEGATIVE
Influenza B by PCR: NEGATIVE
SARS Coronavirus 2 by RT PCR: NEGATIVE

## 2020-11-23 LAB — TROPONIN I (HIGH SENSITIVITY)
Troponin I (High Sensitivity): 6 ng/L (ref <18)
Troponin I (High Sensitivity): 6 ng/L (ref <18)
Troponin I (High Sensitivity): 5 ng/L (ref <18)

## 2020-11-23 LAB — CREATININE, SERUM
Creatinine, Ser: 0.85 mg/dL (ref 0.44–1.00)
GFR, Estimated: >60 mL/min (ref >60)

## 2020-11-23 LAB — TSH: TSH: 1.52 u[IU]/mL (ref 0.350–4.500)

## 2020-11-23 LAB — HIV ANTIBODY (ROUTINE TESTING W REFLEX): HIV Screen 4th Generation wRfx: NONREACTIVE

## 2020-11-23 LAB — T4, FREE: Free T4: 0.99 ng/dL (ref 0.61–1.12)

## 2020-11-23 LAB — MAGNESIUM: Magnesium: 2.2 mg/dL (ref 1.7–2.4)

## 2020-11-23 LAB — BRAIN NATRIURETIC PEPTIDE: B Natriuretic Peptide: 16.2 pg/mL (ref 0.0–100.0)

## 2020-11-23 IMAGING — DX DG CHEST 1V PORT
1 series · 1 of 1 positions shown · non-contrast
Comparison: Chest x-ray [DATE]

CLINICAL DATA: Chest pain.

EXAM:
PORTABLE CHEST 1 VIEW.  AP portable semi erect.

[chest ap]
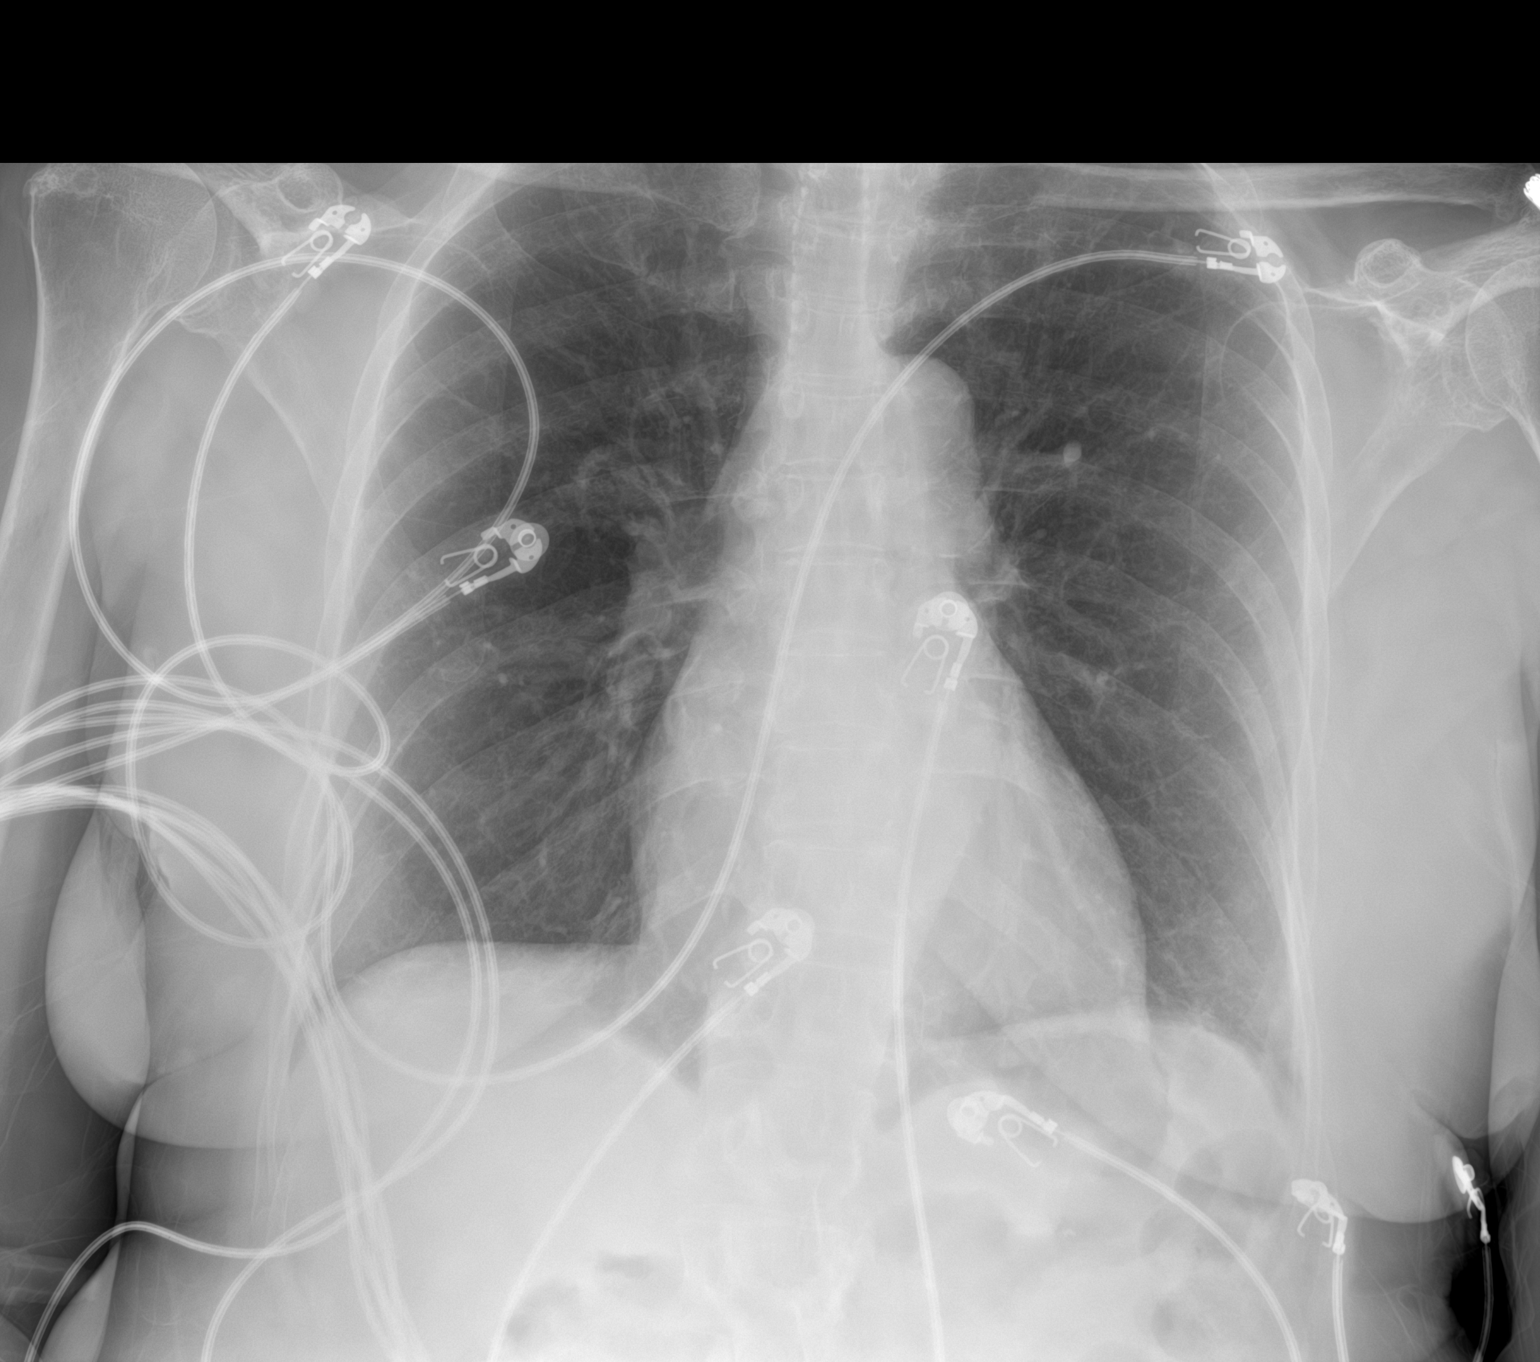

[1 of 1 positions shown; findings below may reference images not displayed]

FINDINGS: The heart size and mediastinal contours are unchanged.
Atherosclerotic plaque.

No focal consolidation. No pulmonary edema. No pleural effusion. No
pneumothorax.

No acute osseous abnormality.
IMPRESSION: No active disease.

## 2020-11-23 MED ORDER — ATORVASTATIN CALCIUM 40 MG PO TABS
40.0000 mg | ORAL_TABLET | Freq: Every day | ORAL | Status: DC
  Administered 2020-11-23 – 2020-11-24 (×2): 40 mg via ORAL
  Filled 2020-11-23 (×2): qty 1

## 2020-11-23 MED ORDER — ONDANSETRON HCL 4 MG/2ML IJ SOLN: 4.0000 mg | Freq: Four times a day (QID) | INTRAMUSCULAR | Status: DC | PRN

## 2020-11-23 MED ORDER — ZOLPIDEM TARTRATE 5 MG PO TABS: 5.0000 mg | ORAL_TABLET | Freq: Every evening | ORAL | Status: DC | PRN

## 2020-11-23 MED ORDER — ASPIRIN 300 MG RE SUPP
300.0000 mg | RECTAL | Status: AC
  Filled 2020-11-23: qty 1

## 2020-11-23 MED ORDER — ACETAMINOPHEN 325 MG PO TABS: 650.0000 mg | ORAL_TABLET | ORAL | Status: DC | PRN

## 2020-11-23 MED ORDER — HEPARIN SODIUM (PORCINE) 5000 UNIT/ML IJ SOLN
5000.0000 [IU] | Freq: Three times a day (TID) | INTRAMUSCULAR | Status: DC
  Administered 2020-11-23 – 2020-11-24 (×4): 5000 [IU] via SUBCUTANEOUS
  Filled 2020-11-23 (×4): qty 1

## 2020-11-23 MED ORDER — POTASSIUM CHLORIDE CRYS ER 20 MEQ PO TBCR
40.0000 meq | EXTENDED_RELEASE_TABLET | Freq: Once | ORAL | Status: AC
  Administered 2020-11-23: 40 meq via ORAL
  Filled 2020-11-23: qty 2

## 2020-11-23 MED ORDER — FUROSEMIDE 40 MG PO TABS
60.0000 mg | ORAL_TABLET | Freq: Every day | ORAL | Status: DC
  Administered 2020-11-23 – 2020-11-24 (×2): 60 mg via ORAL
  Filled 2020-11-23 (×2): qty 1

## 2020-11-23 MED ORDER — AMLODIPINE BESYLATE 10 MG PO TABS
10.0000 mg | ORAL_TABLET | Freq: Every day | ORAL | Status: DC
  Administered 2020-11-23 – 2020-11-24 (×2): 10 mg via ORAL
  Filled 2020-11-23 (×2): qty 1

## 2020-11-23 MED ORDER — VENLAFAXINE HCL ER 37.5 MG PO CP24
37.5000 mg | ORAL_CAPSULE | Freq: Every day | ORAL | Status: DC
  Administered 2020-11-23 – 2020-11-24 (×2): 37.5 mg via ORAL
  Filled 2020-11-23 (×2): qty 1

## 2020-11-23 MED ORDER — NITROGLYCERIN 0.4 MG SL SUBL: 0.4000 mg | SUBLINGUAL_TABLET | SUBLINGUAL | Status: DC | PRN

## 2020-11-23 MED ORDER — METHOCARBAMOL 500 MG PO TABS
500.0000 mg | ORAL_TABLET | Freq: Two times a day (BID) | ORAL | Status: DC
  Administered 2020-11-23 – 2020-11-24 (×3): 500 mg via ORAL
  Filled 2020-11-23 (×3): qty 1

## 2020-11-23 MED ORDER — ASPIRIN EC 81 MG PO TBEC
81.0000 mg | DELAYED_RELEASE_TABLET | Freq: Every day | ORAL | Status: DC
  Administered 2020-11-24: 81 mg via ORAL
  Filled 2020-11-23: qty 1

## 2020-11-23 MED ORDER — ASPIRIN 81 MG PO CHEW
324.0000 mg | CHEWABLE_TABLET | ORAL | Status: AC
  Administered 2020-11-23: 324 mg via ORAL
  Filled 2020-11-23: qty 4

## 2020-11-23 NOTE — Progress Notes (Signed)
  Echocardiogram 2D Echocardiogram with strain has been performed.  Darlina Sicilian M 11/23/2020, 11:54 AM

## 2020-11-23 NOTE — ED Notes (Signed)
Attempted report 

## 2020-11-23 NOTE — Plan of Care (Signed)

## 2020-11-23 NOTE — H&P (Signed)
Cardiology Admission History and Physical:   Patient ID: Katherine Haynes MRN: [id number]; DOB: [birth year]   Admission date: 11/23/2020  PCP:  Reymundo Poll, MD   [facility] Providers Cardiologist:  Bryan Lemma, MD   {    Chief Complaint:  Chest pain  Patient Profile:   Katherine Haynes is a 70 y.o. female with HTN, HLD, chronic loser ext edema, and heart rate related LBBB who is being seen 11/23/2020 for the evaluation of chest pain.  History of Present Illness:   Katherine Haynes with above hx and no hx of nuc study or cath, was seen in ER in April and then by Dr. Herbie Baltimore with chest pain and LBBB, occurring with bradycardia.  Echo and myoview were ordered and BB was weaned off.   Dr. Herbie Baltimore thought that potentially losing synchronized LV function leading to acute decrease in contractility and EF may be what is causing her symptoms.  Her HLD treated, HTN treated. She has chronic lower ext edema.  Uses compression stockings and is on lasix.    Echo in 2020 EF 60-65%, mild LVH, some impaired LV relaxation.    Felt poorly all day yesterday Mostly fatigue Had atypical left sided pain hours sometimes sharp no pleurisy no fever no sick contacts at home Denies dyspnea, palpitations syncope or LE edema. Chest pain eased up in ER but still feels uncomfortable.    Troponin negative x 2 ECG chronic LBBB CXR NAD normal mediastinum    EKG:  SR LBBB No acute changes   K+ 3.3 will replete, Na 137, glucose 120, AST 50 ALT 38 Cr 0.94  BNP 16.2, hs Troponin 6 and repeat 6. Hgb 14.4, WBC 6.9 plts 267  covid neg  PCXR NAD   Past Medical History:  Diagnosis Date  . Hyperlipidemia   . Hypertension   . Menopause syndrome     Past Surgical History:  Procedure Laterality Date  . CHOLECYSTECTOMY N/A 04/05/2017   Procedure: LAPAROSCOPIC CHOLECYSTECTOMY;  Surgeon: Kinsinger, De Blanch, MD;  Location: [facility];  Service: General;  Laterality: N/A;  . MOUTH SURGERY    . TONSILLECTOMY    .  TRANSTHORACIC ECHOCARDIOGRAM  04/19/2019    EF 66 5%.  Mild LVH.  GR 1 DD.  Normal atrial sizes.  Mild aortic valve sclerosis but no stenosis.  Normal IVC.  Otherwise normal valves.  . TUBAL LIGATION       Medications Prior to Admission: Prior to Admission medications   Medication Sig Start Date End Date Taking? Authorizing Provider  amLODipine (NORVASC) 10 MG tablet TAKE 1 TABLET(10 MG) BY MOUTH DAILY 06/10/20   Reymundo Poll, MD  atorvastatin (LIPITOR) 40 MG tablet TAKE 1 TABLET(40 MG) BY MOUTH DAILY 06/10/20   Reymundo Poll, MD  furosemide (LASIX) 20 MG tablet Take 3 tablets (60 mg total) by mouth daily. 07/20/20 07/20/21  Reymundo Poll, MD  methocarbamol (ROBAXIN) 500 MG tablet Take 3 tablets (1,500 mg total) by mouth 4 (four) times daily for 2 days, THEN 2 tablets (1,000 mg total) 4 (four) times daily for 2 days, THEN 1 tablet (500 mg total) 4 (four) times daily for 2 days. 11/18/20 11/24/20  Albertha Ghee, MD  naproxen (NAPROSYN) 500 MG tablet Take 1 tablet (500 mg total) by mouth 2 (two) times daily with a meal for 10 days. 11/18/20 11/28/20  Albertha Ghee, MD  venlafaxine XR (EFFEXOR XR) 37.5 MG 24 hr capsule Take 1 capsule (37.5 mg total) by mouth daily. 11/18/20 02/16/21  Barbaraann Faster,  Fayrene Fearing, MD     Allergies:    Allergies  Allergen Reactions  . Contrast Media [Iodinated Diagnostic Agents] Itching  . Penicillins Hives  . Sulfonamide Derivatives Itching    Social History:   Social History   Socioeconomic History  . Marital status: Married    Spouse name: Not on file  . Number of children: Not on file  . Years of education: Not on file  . Highest education level: Not on file  Occupational History  . Not on file  Tobacco Use  . Smoking status: Former Games developer  . Smokeless tobacco: Never Used  . Tobacco comment:  1 PPD for 2-3 years. Quit ~0272.  Vaping Use  . Vaping Use: Never used  Substance and Sexual Activity  . Alcohol use: No  . Drug use: No  . Sexual activity: Yes     Birth control/protection: Surgical  Other Topics Concern  . Not on file  Social History Narrative   Lives with husband. Good relationship, feels safe at home. Studying medical administration.       Current Social History 09/27/2018        Patient lives with spouse in an apartment on the second floor. There are 14 steps with handrails up to the entrance the patient uses.       Patient's method of transportation is personal car.      The highest level of education was high school diploma.      The patient currently is employed as a Engineer, site in patient's homes.      Identified important Relationships are "My husband, my kids, my grands and great grands."       Pets : None       Interests / Fun: Watching TV, going to beach and park, cooking out with family.       Current Stressors: "finances, teenagers" (grandkids)       Religious / Personal Beliefs: Methodist, "I was raised to treat people the way you want to be treated.          Social Determinants of Health   Financial Resource Strain: Not on file  Food Insecurity: Not on file  Transportation Needs: Not on file  Physical Activity: Not on file  Stress: Not on file  Social Connections: Not on file  Intimate Partner Violence: Not on file    Family History:   The patient's family history includes Arthritis in her sister and sister; COPD in her sister and sister; Diabetes in her mother and sister; Healthy in her brother, brother, and sister; Heart attack in her maternal grandmother; Heart block in her sister; Heart disease in her maternal uncle and mother; Hypertension in her brother, brother, mother, paternal grandmother, sister, sister, and another family member; Sarcoidosis in her sister. There is no history of Breast cancer.    ROS:  Please see the history of present illness.   General:no colds or fevers, no weight changes Skin:no rashes or ulcers HEENT:no blurred vision, no congestion CV:see HPI PUL:see HPI GI:no  diarrhea constipation or melena, no indigestion GU:no hematuria, no dysuria MS:no joint pain, no claudication Neuro:no syncope, no lightheadedness Endo:no diabetes, no thyroid disease  All other ROS reviewed and negative.     Physical Exam/Data:   Vitals:   11/23/20 0715 11/23/20 0815 11/23/20 0915 11/23/20 0945  BP: 130/68 129/64 118/75 109/61  Pulse: (!) 58 60 61 65  Resp: 13 14 14 15   Temp:      TempSrc:  SpO2: 98% 99% 98% 97%  Weight:      Height:       No intake or output data in the 24 hours ending 11/23/20 1044 Last 3 Weights 11/23/2020 11/18/2020 10/26/2020  Weight (lbs) 152 lb 1.9 oz 153 lb 6.4 oz 152 lb  Weight (kg) 69 kg 69.582 kg 68.947 kg     Body mass index is 24.55 kg/m.  General:  Well nourished, well developed, in no acute distress  Affect appropriate Black female in no distress  HEENT: normal Neck supple with no adenopathy JVP normal no bruits no thyromegaly Lungs clear with no wheezing and good diaphragmatic motion Heart:  S1/S2 no murmur, no rub, gallop or click PMI normal Abdomen: benighn, BS positve, no tenderness, no AAA no bruit.  No HSM or HJR Distal pulses intact with no bruits No edema Neuro non-focal Skin warm and dry No muscular weakness    Relevant CV Studies: 04/19/2019  Echo IMPRESSIONS    1. Left ventricular ejection fraction, by visual estimation, is 60 to  65%. The left ventricle has normal function. Normal left ventricular size.  There is mildly increased left ventricular hypertrophy.  2. Left ventricular diastolic Doppler parameters are consistent with  impaired relaxation pattern of LV diastolic filling.  3. Global right ventricle has normal systolic function.The right  ventricular size is normal.  4. Left atrial size was normal.  5. Right atrial size was normal.  6. Mild mitral annular calcification.  7. The mitral valve is normal in structure. Trace mitral valve  regurgitation. No evidence of mitral stenosis.   8. The tricuspid valve is normal in structure. Tricuspid valve  regurgitation is trivial.  9. The aortic valve is tricuspid Aortic valve regurgitation was not  visualized by color flow Doppler. Mild aortic valve sclerosis without  stenosis.  10. The pulmonic valve was not well visualized. Pulmonic valve  regurgitation is not visualized by color flow Doppler.  11. The inferior vena cava is normal in size with greater than 50%  respiratory variability, suggesting right atrial pressure of 3 mmHg.  12. Normal LV systolic function; grade 1 diastolic dysfunction; mild LVH  with proximal septal thickening.   FINDINGS  Left Ventricle: Left ventricular ejection fraction, by visual estimation,  is 60 to 65%. The left ventricle has normal function. There is mildly  increased left ventricular hypertrophy. Normal left ventricular size.  Spectral Doppler shows Left ventricular  diastolic Doppler parameters are consistent with impaired relaxation  pattern of LV diastolic filling.   Laboratory Data:  High Sensitivity Troponin:   Recent Labs  Lab 11/23/20 0628 11/23/20 0832  TROPONINIHS 6 6      Chemistry Recent Labs  Lab 11/23/20 0628  NA 137  K 3.3*  CL 101  CO2 27  GLUCOSE 120*  BUN 13  CREATININE 0.94  CALCIUM 9.0  GFRNONAA >60  ANIONGAP 9    Recent Labs  Lab 11/23/20 0628  PROT 7.5  ALBUMIN 3.8  AST 50*  ALT 38  ALKPHOS 85  BILITOT 1.0   Hematology Recent Labs  Lab 11/23/20 0628  WBC 6.9  RBC 4.49  HGB 14.4  HCT 43.5  MCV 96.9  MCH 32.1  MCHC 33.1  RDW 12.4  PLT 267   BNP Recent Labs  Lab 11/23/20 0628  BNP 16.2    DDimer No results for input(s): DDIMER in the last 168 hours.   Radiology/Studies:  DG Chest Port 1 View  Result Date: 11/23/2020 CLINICAL DATA:  Chest  pain. EXAM: PORTABLE CHEST 1 VIEW.  AP portable semi erect. COMPARISON:  Chest x-ray 04/02/2008 FINDINGS: The heart size and mediastinal contours are unchanged. Atherosclerotic  plaque. No focal consolidation. No pulmonary edema. No pleural effusion. No pneumothorax. No acute osseous abnormality. IMPRESSION: No active disease. Electronically Signed   By: Tish Frederickson M.D.   On: 11/23/2020 06:44     Assessment and Plan:   1. Chest pain, neg troponin, considered cardiac cath but with neg troponin will proceed with exercise Myoview.  This had been scheduled as outpt.  Exercise portion due to LBBB with HR in the 60s but resolves with HR in the 80s.  Hold off on IV heparin.  Will check echo here as well and admit to further eval.    2. Hypokalemia replace with 40 meq now.  3. Intermittent LBBB Dr. Herbie Baltimore had weaned off BB 4. HLD on statin check lipids in AM with fasting for myoview 5. HTN continue home meds  BP controlled.    Risk Assessment/Risk Scores:    Severity of Illness: The appropriate patient status for this patient is INPATIENT. Inpatient status is judged to be reasonable and necessary in order to provide the required intensity of service to ensure the patient's safety. The patient's presenting symptoms, physical exam findings, and initial radiographic and laboratory data in the context of their chronic comorbidities is felt to place them at high risk for further clinical deterioration. Furthermore, it is not anticipated that the patient will be medically stable for discharge from the hospital within 2 midnights of admission. The following factors support the patient status of inpatient.   " The patient's presenting symptoms include chest pain . " The worrisome physical exam findings include LBBB on ECG . " The initial radiographic and laboratory data are worrisome because of K 3.3 . " The chronic co-morbidities include HTN and HLD with abnormal eCG .   * I certify that at the point of admission it is my clinical judgment that the patient will require inpatient hospital care spanning beyond 2 midnights from the point of admission due to high intensity of  service, high risk for further deterioration and high frequency of surveillance required.*    For questions or updates, please contact [facility] Please consult [email or web address] for contact info under     Signed, Nada Boozer, NP  11/23/2020 10:44 AM

## 2020-11-23 NOTE — ED Provider Notes (Signed)
[facility]ERGENCY DEPARTMENT Provider Note   CSN: [id number] Arrival date & time: 11/23/20  0606     History Chief Complaint  Patient presents with  . Chest Pain    Katherine Haynes is a 70 y.o. female.  To the emergency department for evaluation of chest pain.  Patient has been having episodes of chest pain recently and had a cardiac consultation last month.  She reports that she developed chest pain this morning that was more severe than she has been experiencing.  She had some shortness of breath associated with it but no nausea or diaphoresis.  Pain is in the center of her chest and radiates into the back.  It has eased off a lot since she came into the emergency department.  She took 4 low-dose aspirin prior to arrival, no other medications.        Past Medical History:  Diagnosis Date  . Hyperlipidemia   . Hypertension   . Menopause syndrome     Patient Active Problem List   Diagnosis Date Noted  . Cervical pain (neck) 11/18/2020  . LBBB (left bundle branch block) 10/26/2020  . Chest pain with moderate risk for cardiac etiology 10/26/2020  . Vasomotor symptoms due to menopause 01/07/2019  . Edema of both legs 12/11/2018  . Status post cholecystectomy 05/01/2017  . Healthcare maintenance 04/25/2017  . Seasonal allergies 10/07/2010  . Hyperlipidemia 05/14/2007  . Essential hypertension 05/25/2006    Past Surgical History:  Procedure Laterality Date  . CHOLECYSTECTOMY N/A 04/05/2017   Procedure: LAPAROSCOPIC CHOLECYSTECTOMY;  Surgeon: Kinsinger, Arta Bruce, MD;  Location: [facility];  Service: General;  Laterality: N/A;  . MOUTH SURGERY    . TONSILLECTOMY    . TRANSTHORACIC ECHOCARDIOGRAM  04/19/2019    EF 66 5%.  Mild LVH.  GR 1 DD.  Normal atrial sizes.  Mild aortic valve sclerosis but no stenosis.  Normal IVC.  Otherwise normal valves.  . TUBAL LIGATION       OB History    Gravida  3   Para      Term      Preterm      AB       Living  3     SAB      IAB      Ectopic      Multiple      Live Births  3           Family History  Problem Relation Age of Onset  . Heart disease Mother        "enlarged heart"  . Hypertension Mother   . Diabetes Mother   . Heart disease Maternal Uncle        Bypass surg  . Heart attack Maternal Grandmother        also had irregular heart beat requriring a pacemaker.  . Hypertension Other        In multiple relatives.   . Hypertension Paternal Grandmother   . COPD Sister   . Sarcoidosis Sister   . Heart block Sister        s/p PPM  . Hypertension Brother   . Hypertension Sister   . Arthritis Sister   . COPD Sister   . Diabetes Sister   . Hypertension Sister   . Arthritis Sister   . Hypertension Brother   . Healthy Brother   . Healthy Brother   . Healthy Sister   . Breast cancer Neg Hx  Social History   Tobacco Use  . Smoking status: Former Research scientist (life sciences)  . Smokeless tobacco: Never Used  . Tobacco comment:  1 PPD for 2-3 years. Quit ~9485.  Vaping Use  . Vaping Use: Never used  Substance Use Topics  . Alcohol use: No  . Drug use: No    Home Medications Prior to Admission medications   Medication Sig Start Date End Date Taking? Authorizing Provider  amLODipine (NORVASC) 10 MG tablet TAKE 1 TABLET(10 MG) BY MOUTH DAILY 06/10/20   Velna Ochs, MD  atorvastatin (LIPITOR) 40 MG tablet TAKE 1 TABLET(40 MG) BY MOUTH DAILY 06/10/20   Velna Ochs, MD  furosemide (LASIX) 20 MG tablet Take 3 tablets (60 mg total) by mouth daily. 07/20/20 07/20/21  Velna Ochs, MD  methocarbamol (ROBAXIN) 500 MG tablet Take 3 tablets (1,500 mg total) by mouth 4 (four) times daily for 2 days, THEN 2 tablets (1,000 mg total) 4 (four) times daily for 2 days, THEN 1 tablet (500 mg total) 4 (four) times daily for 2 days. 11/18/20 11/24/20  Madalyn Rob, MD  naproxen (NAPROSYN) 500 MG tablet Take 1 tablet (500 mg total) by mouth 2 (two) times daily with a meal for 10 days.  11/18/20 11/28/20  Madalyn Rob, MD  venlafaxine XR (EFFEXOR XR) 37.5 MG 24 hr capsule Take 1 capsule (37.5 mg total) by mouth daily. 11/18/20 02/16/21  Madalyn Rob, MD    Allergies    Contrast media [iodinated diagnostic agents], Penicillins, and Sulfonamide derivatives  Review of Systems   Review of Systems  Cardiovascular: Positive for chest pain.  All other systems reviewed and are negative.   Physical Exam Updated Vital Signs BP 137/62 (BP Location: Right Arm)   Pulse 63   Temp 97.6 F (36.4 C) (Oral)   Resp 16   Ht 5\' 6"  (1.676 m)   Wt 69 kg   LMP 10/07/1990   SpO2 100%   BMI 24.55 kg/m   Physical Exam Vitals and nursing note reviewed.  Constitutional:      General: She is not in acute distress.    Appearance: Normal appearance. She is well-developed.  HENT:     Head: Normocephalic and atraumatic.     Right Ear: Hearing normal.     Left Ear: Hearing normal.     Nose: Nose normal.  Eyes:     Conjunctiva/sclera: Conjunctivae normal.     Pupils: Pupils are equal, round, and reactive to light.  Cardiovascular:     Rate and Rhythm: Regular rhythm.     Heart sounds: S1 normal and S2 normal. No murmur heard. No friction rub. No gallop.   Pulmonary:     Effort: Pulmonary effort is normal. No respiratory distress.     Breath sounds: Normal breath sounds.  Chest:     Chest wall: No tenderness.  Abdominal:     General: Bowel sounds are normal.     Palpations: Abdomen is soft.     Tenderness: There is no abdominal tenderness. There is no guarding or rebound. Negative signs include Murphy's sign and McBurney's sign.     Hernia: No hernia is present.  Musculoskeletal:        General: Normal range of motion.     Cervical back: Normal range of motion and neck supple.  Skin:    General: Skin is warm and dry.     Findings: No rash.  Neurological:     Mental Status: She is alert and oriented to person, place, and time.  GCS: GCS eye subscore is 4. GCS verbal subscore is  5. GCS motor subscore is 6.     Cranial Nerves: No cranial nerve deficit.     Sensory: No sensory deficit.     Coordination: Coordination normal.  Psychiatric:        Speech: Speech normal.        Behavior: Behavior normal.        Thought Content: Thought content normal.     ED Results / Procedures / Treatments   Labs (all labs ordered are listed, but only abnormal results are displayed) Labs Reviewed - No data to display  EKG None  Radiology No results found.  Procedures Procedures   Medications Ordered in ED Medications - No data to display  ED Course  I have reviewed the triage vital signs and the nursing notes.  Pertinent labs & imaging results that were available during my care of the patient were reviewed by me and considered in my medical decision making (see chart for details).    MDM Rules/Calculators/A&P                          Outpatient visit with Dr. Ellyn Hack has been reviewed.  Plan was for outpatient work-up to rule out ischemia as a cause of her left bundle branch block.  She has been scheduled for echo and Myoview but the studies have not been performed yet.  We will initiate basic cardiac evaluation.  EKG with left bundle branch block.  No Scarbossa criteria.  Cardiac troponins pending.  Will sign out to oncoming ER physician.  Anticipate need for cardiology consultation to help with disposition.  Final Clinical Impression(s) / ED Diagnoses Final diagnoses:  Chest pain, unspecified type    Rx / DC Orders ED Discharge Orders    None       Apollo Timothy, Gwenyth Allegra, MD 11/23/20 [phone]

## 2020-11-23 NOTE — ED Triage Notes (Signed)
Pt bib [facility] from home with complaints of chest pain that started this am while cooking food. Pt states pain is a 6/10 and worsens when she lays down. Denies shob, n/v/d. VSS

## 2020-11-23 NOTE — ED Provider Notes (Addendum)
Cardiology will see the patient.  Patient still has some mild chest discomfort.  First troponin was normal.  EKG still left bundle branch block.   Fredia Sorrow, MD 11/23/20 [phone]  Cardiology has seen the patient.  They are getting an echocardiogram but they have requested that she be admitted to telemetry.  They had difficulty doing the order so I did temporary admit orders for them.    Fredia Sorrow, MD 11/23/20 1116

## 2020-11-23 NOTE — Care Management Obs Status (Signed)
[facility] NOTIFICATION   Patient Details  Name: Katherine Haynes MRN: [id number] Date of Birth: Jun 06, 1951   Medicare Observation Status Notification Given:  Yes    Zenon Mayo, RN 11/23/2020, 3:01 PM

## 2020-11-23 NOTE — ED Notes (Signed)
Pt given food and beverage per Dr. Rogene Houston

## 2020-11-23 NOTE — Care Management CC44 (Signed)
Condition Code 44 Documentation Completed  Patient Details  Name: DORIEN BESSENT MRN: [id number] Date of Birth: 11-12-1950   Condition Code 44 given:  Yes Patient signature on Condition Code 44 notice:  Yes Documentation of 2 MD's agreement:  Yes Code 44 added to claim:  Yes    Zenon Mayo, RN 11/23/2020, 3:01 PM

## 2020-11-24 ENCOUNTER — Observation Stay (HOSPITAL_COMMUNITY): Payer: Medicare Other

## 2020-11-24 DIAGNOSIS — R0602 Shortness of breath: Secondary | ICD-10-CM | POA: Diagnosis not present

## 2020-11-24 DIAGNOSIS — R079 Chest pain, unspecified: Secondary | ICD-10-CM

## 2020-11-24 DIAGNOSIS — Z79899 Other long term (current) drug therapy: Secondary | ICD-10-CM | POA: Diagnosis not present

## 2020-11-24 DIAGNOSIS — Z20822 Contact with and (suspected) exposure to covid-19: Secondary | ICD-10-CM | POA: Diagnosis not present

## 2020-11-24 DIAGNOSIS — Z87891 Personal history of nicotine dependence: Secondary | ICD-10-CM | POA: Diagnosis not present

## 2020-11-24 DIAGNOSIS — I10 Essential (primary) hypertension: Secondary | ICD-10-CM | POA: Diagnosis not present

## 2020-11-24 LAB — LIPID PANEL
Cholesterol: 130 mg/dL (ref 0–200)
HDL: 54 mg/dL (ref >40)
LDL Cholesterol: 66 mg/dL (ref 0–99)
Total CHOL/HDL Ratio: 2.4 RATIO
Triglycerides: 49 mg/dL (ref <150)
VLDL: 10 mg/dL (ref 0–40)

## 2020-11-24 LAB — BASIC METABOLIC PANEL
Anion gap: 9 (ref 5–15)
BUN: 9 mg/dL (ref 8–23)
CO2: 28 mmol/L (ref 22–32)
Calcium: 9.1 mg/dL (ref 8.9–10.3)
Chloride: 101 mmol/L (ref 98–111)
Creatinine, Ser: 0.84 mg/dL (ref 0.44–1.00)
GFR, Estimated: >60 mL/min (ref >60)
Glucose, Bld: 93 mg/dL (ref 70–99)
Potassium: 3.5 mmol/L (ref 3.5–5.1)
Sodium: 138 mmol/L (ref 135–145)

## 2020-11-24 LAB — CBC
HCT: 41.4 % (ref 36.0–46.0)
Hemoglobin: 13.9 g/dL (ref 12.0–15.0)
MCH: 31.8 pg (ref 26.0–34.0)
MCHC: 33.6 g/dL (ref 30.0–36.0)
MCV: 94.7 fL (ref 80.0–100.0)
Platelets: 237 10*3/uL (ref 150–400)
RBC: 4.37 MIL/uL (ref 3.87–5.11)
RDW: 12.2 % (ref 11.5–15.5)
WBC: 6.7 10*3/uL (ref 4.0–10.5)
nRBC: 0 % (ref 0.0–0.2)

## 2020-11-24 LAB — NM MYOCAR MULTI W/SPECT W/WALL MOTION / EF
Estimated workload: 1 METS
Exercise duration (min): 5 min
Exercise duration (sec): 33 s
MPHR: 153 {beats}/min
Peak HR: 94 {beats}/min
Percent HR: 61 %
Rest HR: 65 {beats}/min

## 2020-11-24 LAB — GLUCOSE, CAPILLARY: Glucose-Capillary: 110 mg/dL — ABNORMAL HIGH (ref 70–99)

## 2020-11-24 MED ORDER — TECHNETIUM TC 99M TETROFOSMIN IV KIT
31.0000 | PACK | Freq: Once | INTRAVENOUS | Status: AC | PRN
  Administered 2020-11-24: 31 via INTRAVENOUS

## 2020-11-24 MED ORDER — REGADENOSON 0.4 MG/5ML IV SOLN
INTRAVENOUS | Status: AC
  Filled 2020-11-24: qty 5

## 2020-11-24 MED ORDER — REGADENOSON 0.4 MG/5ML IV SOLN
0.4000 mg | Freq: Once | INTRAVENOUS | Status: AC
  Administered 2020-11-24: 0.4 mg via INTRAVENOUS

## 2020-11-24 MED ORDER — TECHNETIUM TC 99M TETROFOSMIN IV KIT
10.0000 | PACK | Freq: Once | INTRAVENOUS | Status: AC | PRN
  Administered 2020-11-24: 10 via INTRAVENOUS

## 2020-11-24 MED ORDER — POTASSIUM CHLORIDE ER 10 MEQ PO TBCR: 10.0000 meq | EXTENDED_RELEASE_TABLET | Freq: Every day | ORAL | 11 refills | Status: DC

## 2020-11-24 NOTE — Discharge Summary (Signed)
Discharge Summary    Patient ID: Katherine Haynes MRN: [id number]; DOB: [birth year]  Admit date: 11/23/2020 Discharge date: 11/24/2020  PCP:  Velna Ochs, MD   [facility] Providers Cardiologist:  Glenetta Hew, MD     Discharge Diagnoses    Active Problems:   Chest pain    Diagnostic Studies/Procedures    MYOVIEW: Preliminary report: EF normal w/ no perfusion defects at rest or with exertion. QRS 130 ms at baseline, widens with increased HR.  ECHO: 11/23/2020 1. Left ventricular ejection fraction, by estimation, is 60 to 65%. The left ventricle has normal function. Left ventricular endocardial border not optimally defined to evaluate regional wall motion. Left ventricular diastolic parameters are indeterminate.  2. Right ventricular systolic function is normal. The right ventricular  size is normal.  3. Trivial mitral valve regurgitation.  4. Aortic valve regurgitation is not visualized. Mild aortic valve  sclerosis is present, with no evidence of aortic valve stenosis.   Comparison(s): The left ventricular function is unchanged. _____________   History of Present Illness     Katherine Haynes is a 70 y.o. female with HTN, HLD, chronic lower ext edema, and heart rate related LBBBwho was admitted 5/9/2022for chest pain.  Hospital Course     Consultants: None   Her cardiac enzymes were negative for MI.  An echocardiogram was performed, results are above and was normal.  She was mildly hypokalemic on admission and her potassium was supplemented.  She is on Lasix 60 mg daily, and will have K-Dur 10 milliequivalents daily added to her medication regimen.  Check a BMET at follow-up.  A Lexiscan was performed on 3/10 to evaluate for ischemia.  Final results are pending, but Dr Johnsie Cancel reviewed the images and she has good perfusion at rest and during the stress portion.  No scar, no ischemia and a normal EF.  Dr Johnsie Cancel reviewed all data and felt that no further  inpatient work-up was indicated.  Katherine Haynes is considered stable for discharge, to follow-up as an outpatient.  Did the patient have an acute coronary syndrome (MI, NSTEMI, STEMI, etc) this admission?:  No                               Did the patient have a percutaneous coronary intervention (stent / angioplasty)?:  No.      _____________  Discharge Vitals Blood pressure 130/68, pulse 72, temperature (!) 97.4 F (36.3 C), temperature source Oral, resp. rate 19, height 5\' 6"  (1.676 m), weight 67.2 kg, last menstrual period 10/07/1990, SpO2 100 %.  Filed Weights   11/23/20 0615 11/23/20 1438 11/24/20 0454  Weight: 69 kg 69.3 kg 67.2 kg    Labs & Radiologic Studies    CBC Recent Labs    11/23/20 0628 11/23/20 1456 11/24/20 0347  WBC 6.9 7.9 6.7  NEUTROABS 3.1  --   --   HGB 14.4 13.8 13.9  HCT 43.5 40.6 41.4  MCV 96.9 94.6 94.7  PLT 267 250 983   Basic Metabolic Panel Recent Labs    11/23/20 0628 11/23/20 1456 11/24/20 0347  NA 137  --  138  K 3.3*  --  3.5  CL 101  --  101  CO2 27  --  28  GLUCOSE 120*  --  93  BUN 13  --  9  CREATININE 0.94 0.85 0.84  CALCIUM 9.0  --  9.1  MG  --  2.2  --  Discharge Summary    Patient ID: Katherine Haynes MRN: [id number]; DOB: [birth year]  Admit date: 11/23/2020 Discharge date: 11/24/2020  PCP:  Velna Ochs, MD   [facility] Providers Cardiologist:  Glenetta Hew, MD     Discharge Diagnoses    Active Problems:   Chest pain    Diagnostic Studies/Procedures    MYOVIEW: Preliminary report: EF normal w/ no perfusion defects at rest or with exertion. QRS 130 ms at baseline, widens with increased HR.  ECHO: 11/23/2020 1. Left ventricular ejection fraction, by estimation, is 60 to 65%. The left ventricle has normal function. Left ventricular endocardial border not optimally defined to evaluate regional wall motion. Left ventricular diastolic parameters are indeterminate.  2. Right ventricular systolic function is normal. The right ventricular  size is normal.  3. Trivial mitral valve regurgitation.  4. Aortic valve regurgitation is not visualized. Mild aortic valve  sclerosis is present, with no evidence of aortic valve stenosis.   Comparison(s): The left ventricular function is unchanged. _____________   History of Present Illness     Katherine Haynes is a 70 y.o. female with HTN, HLD, chronic lower ext edema, and heart rate related LBBBwho was admitted 5/9/2022for chest pain.  Hospital Course     Consultants: None   Her cardiac enzymes were negative for MI.  An echocardiogram was performed, results are above and was normal.  She was mildly hypokalemic on admission and her potassium was supplemented.  She is on Lasix 60 mg daily, and will have K-Dur 10 milliequivalents daily added to her medication regimen.  Check a BMET at follow-up.  A Lexiscan was performed on 3/10 to evaluate for ischemia.  Final results are pending, but Dr Johnsie Cancel reviewed the images and she has good perfusion at rest and during the stress portion.  No scar, no ischemia and a normal EF.  Dr Johnsie Cancel reviewed all data and felt that no further  inpatient work-up was indicated.  Katherine Haynes is considered stable for discharge, to follow-up as an outpatient.  Did the patient have an acute coronary syndrome (MI, NSTEMI, STEMI, etc) this admission?:  No                               Did the patient have a percutaneous coronary intervention (stent / angioplasty)?:  No.      _____________  Discharge Vitals Blood pressure 130/68, pulse 72, temperature (!) 97.4 F (36.3 C), temperature source Oral, resp. rate 19, height 5\' 6"  (1.676 m), weight 67.2 kg, last menstrual period 10/07/1990, SpO2 100 %.  Filed Weights   11/23/20 0615 11/23/20 1438 11/24/20 0454  Weight: 69 kg 69.3 kg 67.2 kg    Labs & Radiologic Studies    CBC Recent Labs    11/23/20 0628 11/23/20 1456 11/24/20 0347  WBC 6.9 7.9 6.7  NEUTROABS 3.1  --   --   HGB 14.4 13.8 13.9  HCT 43.5 40.6 41.4  MCV 96.9 94.6 94.7  PLT 267 250 983   Basic Metabolic Panel Recent Labs    11/23/20 0628 11/23/20 1456 11/24/20 0347  NA 137  --  138  K 3.3*  --  3.5  CL 101  --  101  CO2 27  --  28  GLUCOSE 120*  --  93  BUN 13  --  9  CREATININE 0.94 0.85 0.84  CALCIUM 9.0  --  9.1  MG  --  2.2  --  Discharge Summary    Patient ID: Katherine Haynes MRN: [id number]; DOB: [birth year]  Admit date: 11/23/2020 Discharge date: 11/24/2020  PCP:  Velna Ochs, MD   [facility] Providers Cardiologist:  Glenetta Hew, MD     Discharge Diagnoses    Active Problems:   Chest pain    Diagnostic Studies/Procedures    MYOVIEW: Preliminary report: EF normal w/ no perfusion defects at rest or with exertion. QRS 130 ms at baseline, widens with increased HR.  ECHO: 11/23/2020 1. Left ventricular ejection fraction, by estimation, is 60 to 65%. The left ventricle has normal function. Left ventricular endocardial border not optimally defined to evaluate regional wall motion. Left ventricular diastolic parameters are indeterminate.  2. Right ventricular systolic function is normal. The right ventricular  size is normal.  3. Trivial mitral valve regurgitation.  4. Aortic valve regurgitation is not visualized. Mild aortic valve  sclerosis is present, with no evidence of aortic valve stenosis.   Comparison(s): The left ventricular function is unchanged. _____________   History of Present Illness     Katherine Haynes is a 70 y.o. female with HTN, HLD, chronic lower ext edema, and heart rate related LBBBwho was admitted 5/9/2022for chest pain.  Hospital Course     Consultants: None   Her cardiac enzymes were negative for MI.  An echocardiogram was performed, results are above and was normal.  She was mildly hypokalemic on admission and her potassium was supplemented.  She is on Lasix 60 mg daily, and will have K-Dur 10 milliequivalents daily added to her medication regimen.  Check a BMET at follow-up.  A Lexiscan was performed on 3/10 to evaluate for ischemia.  Final results are pending, but Dr Johnsie Cancel reviewed the images and she has good perfusion at rest and during the stress portion.  No scar, no ischemia and a normal EF.  Dr Johnsie Cancel reviewed all data and felt that no further  inpatient work-up was indicated.  Katherine Haynes is considered stable for discharge, to follow-up as an outpatient.  Did the patient have an acute coronary syndrome (MI, NSTEMI, STEMI, etc) this admission?:  No                               Did the patient have a percutaneous coronary intervention (stent / angioplasty)?:  No.      _____________  Discharge Vitals Blood pressure 130/68, pulse 72, temperature (!) 97.4 F (36.3 C), temperature source Oral, resp. rate 19, height 5\' 6"  (1.676 m), weight 67.2 kg, last menstrual period 10/07/1990, SpO2 100 %.  Filed Weights   11/23/20 0615 11/23/20 1438 11/24/20 0454  Weight: 69 kg 69.3 kg 67.2 kg    Labs & Radiologic Studies    CBC Recent Labs    11/23/20 0628 11/23/20 1456 11/24/20 0347  WBC 6.9 7.9 6.7  NEUTROABS 3.1  --   --   HGB 14.4 13.8 13.9  HCT 43.5 40.6 41.4  MCV 96.9 94.6 94.7  PLT 267 250 983   Basic Metabolic Panel Recent Labs    11/23/20 0628 11/23/20 1456 11/24/20 0347  NA 137  --  138  K 3.3*  --  3.5  CL 101  --  101  CO2 27  --  28  GLUCOSE 120*  --  93  BUN 13  --  9  CREATININE 0.94 0.85 0.84  CALCIUM 9.0  --  9.1  MG  --  2.2  --  Liver Function Tests Recent Labs    11/23/20 0628  AST 50*  ALT 38  ALKPHOS 85  BILITOT 1.0  PROT 7.5  ALBUMIN 3.8   No results for input(s): LIPASE, AMYLASE in the last 72 hours. High Sensitivity Troponin:   Recent Labs  Lab 11/23/20 0628 11/23/20 0832 11/23/20 1456  TROPONINIHS 6 6 5     BNP Invalid input(s): POCBNP D-Dimer No results for input(s): DDIMER in the last 72 hours. Hemoglobin A1C No results for input(s): HGBA1C in the last 72 hours. Fasting Lipid Panel Recent Labs    11/24/20 0347  CHOL 130  HDL 54  LDLCALC 66  TRIG 49  CHOLHDL 2.4   Thyroid Function Tests Recent Labs    11/23/20 1456  TSH 1.520   _____________  DG Chest Port 1 View  Result Date: 11/23/2020 CLINICAL DATA:  Chest pain. EXAM: PORTABLE CHEST 1 VIEW.  AP portable  semi erect. COMPARISON:  Chest x-ray 04/02/2008 FINDINGS: The heart size and mediastinal contours are unchanged. Atherosclerotic plaque. No focal consolidation. No pulmonary edema. No pleural effusion. No pneumothorax. No acute osseous abnormality. IMPRESSION: No active disease. Electronically Signed   By: Iven Finn M.D.   On: 11/23/2020 06:44   ECHOCARDIOGRAM COMPLETE  Result Date: 11/23/2020    ECHOCARDIOGRAM REPORT   Patient Name:   Katherine Haynes Date of Exam: 11/23/2020 Medical Rec #:  [id number]        Height:       66.0 in Accession #:    [id number]       Weight:       152.1 lb Date of Birth:  1950-08-07        BSA:          1.780 m Patient Age:    77 years         BP:           120/60 mmHg Patient Gender: F                HR:           62 bpm. Exam Location:  Inpatient Procedure: 2D Echo, Cardiac Doppler, Color Doppler and Strain Analysis Indications:    Chest Pain R07.9  History:        Patient has prior history of Echocardiogram examinations, most                 recent 04/19/2019. Arrythmias:LBBB; Risk Factors:Hypertension and                 Dyslipidemia. Negative Troponin x 2.  Sonographer:    Darlina Sicilian RDCS Referring Phys: [place]  1. Left ventricular ejection fraction, by estimation, is 60 to 65%. The left ventricle has normal function. Left ventricular endocardial border not optimally defined to evaluate regional wall motion. Left ventricular diastolic parameters are indeterminate.  2. Right ventricular systolic function is normal. The right ventricular size is normal.  3. Trivial mitral valve regurgitation.  4. Aortic valve regurgitation is not visualized. Mild aortic valve sclerosis is present, with no evidence of aortic valve stenosis. Comparison(s): The left ventricular function is unchanged. FINDINGS  Left Ventricle: Left ventricular ejection fraction, by estimation, is 60 to 65%. The left ventricle has normal function. Left ventricular endocardial border not  optimally defined to evaluate regional wall motion. The left ventricular internal cavity size was normal in size. There is no left ventricular hypertrophy. Left ventricular diastolic parameters are indeterminate. Right Ventricle: The right ventricular

## 2020-11-24 NOTE — Progress Notes (Addendum)
Progress Note  Patient Name: Katherine Haynes Date of Encounter: 11/24/2020  [facility] Cardiologist: Bryan Lemma, MD   Subjective   No chest pain or SOB overnight, has not been OOB  Inpatient Medications    Scheduled Meds:  amLODipine  10 mg Oral Daily   aspirin EC  81 mg Oral Daily   atorvastatin  40 mg Oral Daily   furosemide  60 mg Oral Daily   heparin  5,000 Units Subcutaneous Q8H   methocarbamol  500 mg Oral BID   venlafaxine XR  37.5 mg Oral Daily   Continuous Infusions:   PRN Meds: acetaminophen, nitroGLYCERIN, ondansetron (ZOFRAN) IV, zolpidem   Vital Signs    Vitals:   11/23/20 1949 11/24/20 0030 11/24/20 0038 11/24/20 0454  BP: (!) 129/57 119/62 115/62 110/64  Pulse: 71 75 66 68  Resp: 18 18 18 18   Temp: 98.1 F (36.7 C) 97.6 F (36.4 C) 98.2 F (36.8 C) 97.8 F (36.6 C)  TempSrc: Oral Oral Oral Oral  SpO2: 98% 100% 98% 98%  Weight:    67.2 kg  Height:        Intake/Output Summary (Last 24 hours) at 11/24/2020 4696 Last data filed at 11/23/2020 2200 Gross per 24 hour  Intake 720 ml  Output --  Net 720 ml   Last 3 Weights 11/24/2020 11/23/2020 11/23/2020  Weight (lbs) 148 lb 1.6 oz 152 lb 12.5 oz 152 lb 1.9 oz  Weight (kg) 67.178 kg 69.3 kg 69 kg      Telemetry    SR, no ectopy - Personally Reviewed  ECG    None today - Personally Reviewed  Physical Exam   GEN: No acute distress.   Neck: No JVD Cardiac: RRR, no murmurs, rubs, or gallops.  Respiratory: Clear to auscultation bilaterally. GI: Soft, nontender, non-distended  MS: No edema; No deformity. Neuro:  Nonfocal  Psych: Normal affect   Labs    High Sensitivity Troponin:   Recent Labs  Lab 11/23/20 0628 11/23/20 0832 11/23/20 1456  TROPONINIHS 6 6 5       Chemistry Recent Labs  Lab 11/23/20 0628 11/23/20 1456 11/24/20 0347  NA 137  --  138  K 3.3*  --  3.5  CL 101  --  101  CO2 27  --  28  GLUCOSE 120*  --  93  BUN 13  --  9  CREATININE 0.94 0.85 0.84   CALCIUM 9.0  --  9.1  PROT 7.5  --   --   ALBUMIN 3.8  --   --   AST 50*  --   --   ALT 38  --   --   ALKPHOS 85  --   --   BILITOT 1.0  --   --   GFRNONAA >60 >60 >60  ANIONGAP 9  --  9     Hematology Recent Labs  Lab 11/23/20 0628 11/23/20 1456 11/24/20 0347  WBC 6.9 7.9 6.7  RBC 4.49 4.29 4.37  HGB 14.4 13.8 13.9  HCT 43.5 40.6 41.4  MCV 96.9 94.6 94.7  MCH 32.1 32.2 31.8  MCHC 33.1 34.0 33.6  RDW 12.4 12.3 12.2  PLT 267 250 237   Lab Results  Component Value Date   CHOL 130 11/24/2020   HDL 54 11/24/2020   LDLCALC 66 11/24/2020   TRIG 49 11/24/2020   CHOLHDL 2.4 11/24/2020   Lab Results  Component Value Date   TSH 1.520 11/23/2020   Lab Results  Component Value Date   HGBA1C 6.0 11/22/2012    BNP Recent Labs  Lab 11/23/20 0628  BNP 16.2     DDimer No results for input(s): DDIMER in the last 168 hours.   Radiology    DG Chest Port 1 View  Result Date: 11/23/2020 CLINICAL DATA:  Chest pain. EXAM: PORTABLE CHEST 1 VIEW.  AP portable semi erect. COMPARISON:  Chest x-ray 04/02/2008 FINDINGS: The heart size and mediastinal contours are unchanged. Atherosclerotic plaque. No focal consolidation. No pulmonary edema. No pleural effusion. No pneumothorax. No acute osseous abnormality. IMPRESSION: No active disease. Electronically Signed   By: Tish Frederickson M.D.   On: 11/23/2020 06:44   ECHOCARDIOGRAM COMPLETE  Result Date: 11/23/2020    ECHOCARDIOGRAM REPORT   Patient Name:   Katherine Haynes Date of Exam: 11/23/2020 Medical Rec #:  [id number]        Height:       66.0 in Accession #:    [id number]       Weight:       152.1 lb Date of Birth:  08-15-1950        BSA:          1.780 m Patient Age:    70 years         BP:           120/60 mmHg Patient Gender: F                HR:           62 bpm. Exam Location:  Inpatient Procedure: 2D Echo, Cardiac Doppler, Color Doppler and Strain Analysis Indications:    Chest Pain R07.9  History:        Patient has prior history  of Echocardiogram examinations, most                 recent 04/19/2019. Arrythmias:LBBB; Risk Factors:Hypertension and                 Dyslipidemia. Negative Troponin x 2.  Sonographer:    Leta Jungling RDCS Referring Phys: [id number] Wendall Stade IMPRESSIONS  1. Left ventricular ejection fraction, by estimation, is 60 to 65%. The left ventricle has normal function. Left ventricular endocardial border not optimally defined to evaluate regional wall motion. Left ventricular diastolic parameters are indeterminate.  2. Right ventricular systolic function is normal. The right ventricular size is normal.  3. Trivial mitral valve regurgitation.  4. Aortic valve regurgitation is not visualized. Mild aortic valve sclerosis is present, with no evidence of aortic valve stenosis. Comparison(s): The left ventricular function is unchanged. FINDINGS  Left Ventricle: Left ventricular ejection fraction, by estimation, is 60 to 65%. The left ventricle has normal function. Left ventricular endocardial border not optimally defined to evaluate regional wall motion. The left ventricular internal cavity size was normal in size. There is no left ventricular hypertrophy. Left ventricular diastolic parameters are indeterminate. Right Ventricle: The right ventricular size is normal. Right vetricular wall thickness was not well visualized. Right ventricular systolic function is normal. Left Atrium: Left atrial size was normal in size. Right Atrium: Right atrial size was normal in size. Pericardium: There is no evidence of pericardial effusion. Mitral Valve: Mild mitral annular calcification. Trivial mitral valve regurgitation. Tricuspid Valve: The tricuspid valve is normal in structure. Tricuspid valve regurgitation is trivial. Aortic Valve: Aortic valve regurgitation is not visualized. Mild aortic valve sclerosis is present, with no evidence of aortic valve stenosis. Pulmonic Valve: The pulmonic valve was  normal in structure. Pulmonic valve  regurgitation is trivial. Aorta: The aortic root is normal in size and structure. IAS/Shunts: No atrial level shunt detected by color flow Doppler.  LEFT VENTRICLE PLAX 2D LVIDd:         3.20 cm     Diastology LVIDs:         2.00 cm     LV e' medial:    4.24 cm/s LV PW:         1.00 cm     LV E/e' medial:  11.1 LV IVS:        1.10 cm     LV e' lateral:   7.94 cm/s LVOT diam:     1.90 cm     LV E/e' lateral: 5.9 LV SV:         46 LV SV Index:   26          2D Longitudinal Strain LVOT Area:     2.84 cm    2D Strain GLS Avg:     -20.7 %  LV Volumes (MOD) LV vol d, MOD A2C: 67.0 ml LV vol d, MOD A4C: 89.4 ml LV vol s, MOD A2C: 28.0 ml LV vol s, MOD A4C: 43.0 ml LV SV MOD A2C:     39.0 ml LV SV MOD A4C:     89.4 ml LV SV MOD BP:      43.0 ml RIGHT VENTRICLE RV S prime:     11.20 cm/s TAPSE (M-mode): 2.0 cm LEFT ATRIUM             Index       RIGHT ATRIUM          Index LA diam:        2.90 cm 1.63 cm/m  RA Area:     8.19 cm LA Vol (A2C):   26.8 ml 15.05 ml/m RA Volume:   13.70 ml 7.70 ml/m LA Vol (A4C):   19.4 ml 10.90 ml/m LA Biplane Vol: 23.4 ml 13.14 ml/m  AORTIC VALVE LVOT Vmax:   84.90 cm/s LVOT Vmean:  56.300 cm/s LVOT VTI:    0.163 m  AORTA Ao Root diam: 2.70 cm MITRAL VALVE MV Area (PHT): 2.01 cm    SHUNTS MV Decel Time: 377 msec    Systemic VTI:  0.16 m MV E velocity: 47.00 cm/s  Systemic Diam: 1.90 cm MV A velocity: 67.50 cm/s MV E/A ratio:  0.70 Dietrich Pates MD Electronically signed by Dietrich Pates MD Signature Date/Time: 11/23/2020/2:43:07 PM    Final     Cardiac Studies   MYOVIEW: Ordered  ECHO: 11/23/2020  1. Left ventricular ejection fraction, by estimation, is 60 to 65%. The  left ventricle has normal function. Left ventricular endocardial border  not optimally defined to evaluate regional wall motion. Left ventricular  diastolic parameters are  indeterminate.   2. Right ventricular systolic function is normal. The right ventricular  size is normal.   3. Trivial mitral valve  regurgitation.   4. Aortic valve regurgitation is not visualized. Mild aortic valve  sclerosis is present, with no evidence of aortic valve stenosis.   Patient Profile     70 y.o. female with HTN, HLD, chronic lower ext edema, and heart rate related LBBB who was admitted 11/23/2020 for chest pain.  Assessment & Plan    1. Chest pain - ez neg MI - echo results above, EF normal - MV ordered, f/u on results  2. HLD - LDL at goal  at 66 on current rx  3. HTN - BP well-controlled on home rx amlodipine 10 mg qd and Lasix 60 mg qd  Plan: d/c if MV normal  Active Problems:   Chest pain  For questions or updates, please contact [facility] Please consult [email or web address] for contact info under      Signed, Theodore Demark, PA-C  11/24/2020, 8:22 AM    Patient examined chart reviewed. See admission note yesterday Exam benign no murmur clear lungs R/O chronic LBBB TTE normal no RWMAls CXR NAD for exercise myovue today If negative can be d/c home latter today   Charlton Haws MD [facility]

## 2020-11-24 NOTE — Progress Notes (Signed)
   Katherine Haynes presented for a nuclear stress test today.  No immediate complications.  Stress imaging is pending at this time.  Preliminary EKG findings may be listed in the chart, but the stress test result will not be finalized until perfusion imaging is complete.  1 day study, Radiology to read.  Rosaria Ferries, PA-C 11/24/2020, 12:49 PM

## 2020-11-24 NOTE — Plan of Care (Signed)
  Problem: Clinical Measurements: Goal: Will remain free from infection Outcome: Progressing   Problem: Clinical Measurements: Goal: Diagnostic test results will improve Outcome: Progressing   Problem: Clinical Measurements: Goal: Cardiovascular complication will be avoided Outcome: Progressing   

## 2020-11-24 NOTE — Progress Notes (Signed)
D/C instructions given and reviewed. Tele and IV removed, tolerated well. Notified us of need for transport.

## 2020-11-24 NOTE — Plan of Care (Signed)

## 2020-11-25 ENCOUNTER — Other Ambulatory Visit (HOSPITAL_COMMUNITY): Payer: Medicare Other

## 2020-12-07 ENCOUNTER — Other Ambulatory Visit (HOSPITAL_COMMUNITY): Payer: Medicare Other

## 2020-12-10 ENCOUNTER — Encounter (HOSPITAL_COMMUNITY): Payer: Medicare Other

## 2020-12-10 ENCOUNTER — Inpatient Hospital Stay (HOSPITAL_COMMUNITY): Admission: RE | Admit: 2020-12-10 | Payer: Medicare Other | Source: Ambulatory Visit

## 2020-12-16 ENCOUNTER — Ambulatory Visit (INDEPENDENT_AMBULATORY_CARE_PROVIDER_SITE_OTHER): Payer: Medicare Other | Admitting: Internal Medicine

## 2020-12-16 ENCOUNTER — Ambulatory Visit (HOSPITAL_COMMUNITY)
Admission: RE | Admit: 2020-12-16 | Discharge: 2020-12-16 | Disposition: A | Discharge status: Home / Self Care | Payer: Medicare Other | Source: Ambulatory Visit | Attending: Internal Medicine | Admitting: Internal Medicine

## 2020-12-16 ENCOUNTER — Encounter: Payer: Self-pay | Admitting: Internal Medicine

## 2020-12-16 ENCOUNTER — Other Ambulatory Visit: Payer: Self-pay

## 2020-12-16 VITALS — BP 102/64 | HR 60 | Temp 97.6°F | Ht 66.0 in | Wt 148.1 lb

## 2020-12-16 DIAGNOSIS — N951 Menopausal and female climacteric states: Secondary | ICD-10-CM

## 2020-12-16 DIAGNOSIS — Z23 Encounter for immunization: Secondary | ICD-10-CM | POA: Diagnosis not present

## 2020-12-16 DIAGNOSIS — R600 Localized edema: Secondary | ICD-10-CM

## 2020-12-16 DIAGNOSIS — I10 Essential (primary) hypertension: Secondary | ICD-10-CM | POA: Diagnosis not present

## 2020-12-16 DIAGNOSIS — I447 Left bundle-branch block, unspecified: Secondary | ICD-10-CM

## 2020-12-16 DIAGNOSIS — M542 Cervicalgia: Secondary | ICD-10-CM | POA: Insufficient documentation

## 2020-12-16 DIAGNOSIS — T887XXA Unspecified adverse effect of drug or medicament, initial encounter: Secondary | ICD-10-CM

## 2020-12-16 IMAGING — DX DG CERVICAL SPINE COMPLETE 4+V
5 series · 5 of 5 positions shown · non-contrast
Comparison: None.

CLINICAL DATA: Neck pain and cervical myelopathy.

EXAM:
CERVICAL SPINE - COMPLETE 4+ VIEW

[c-spine lat]
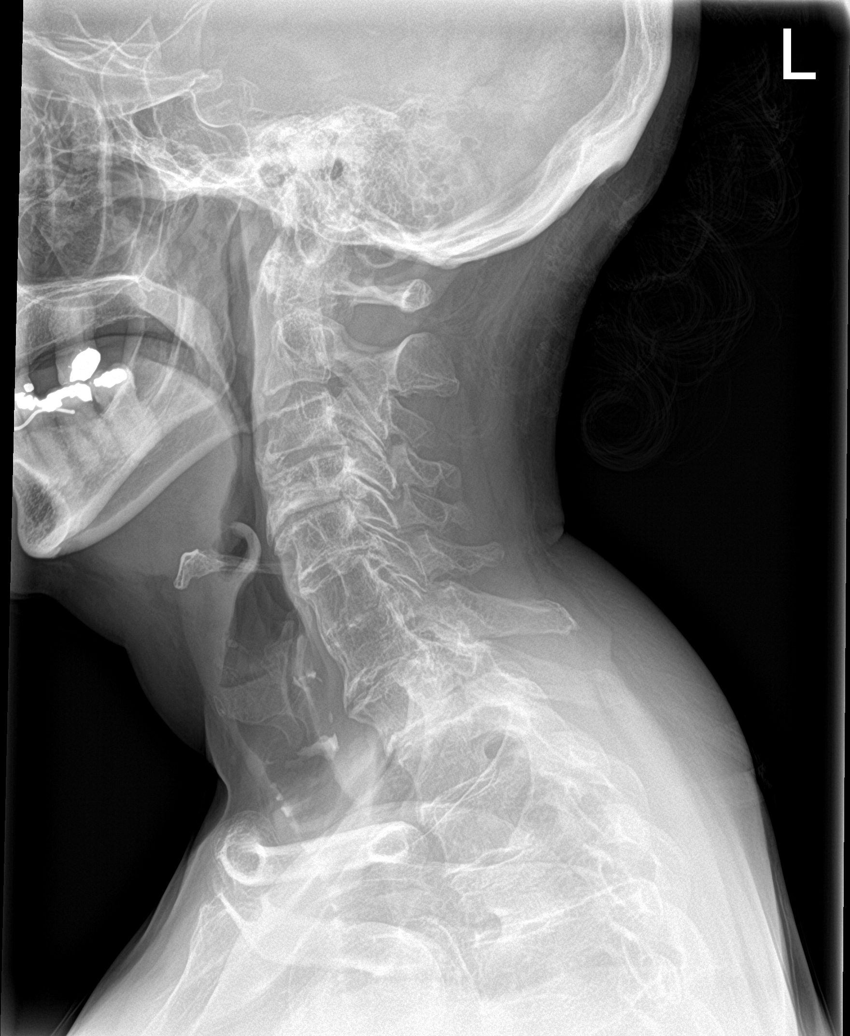

[c-spine obl (1 of 2)]
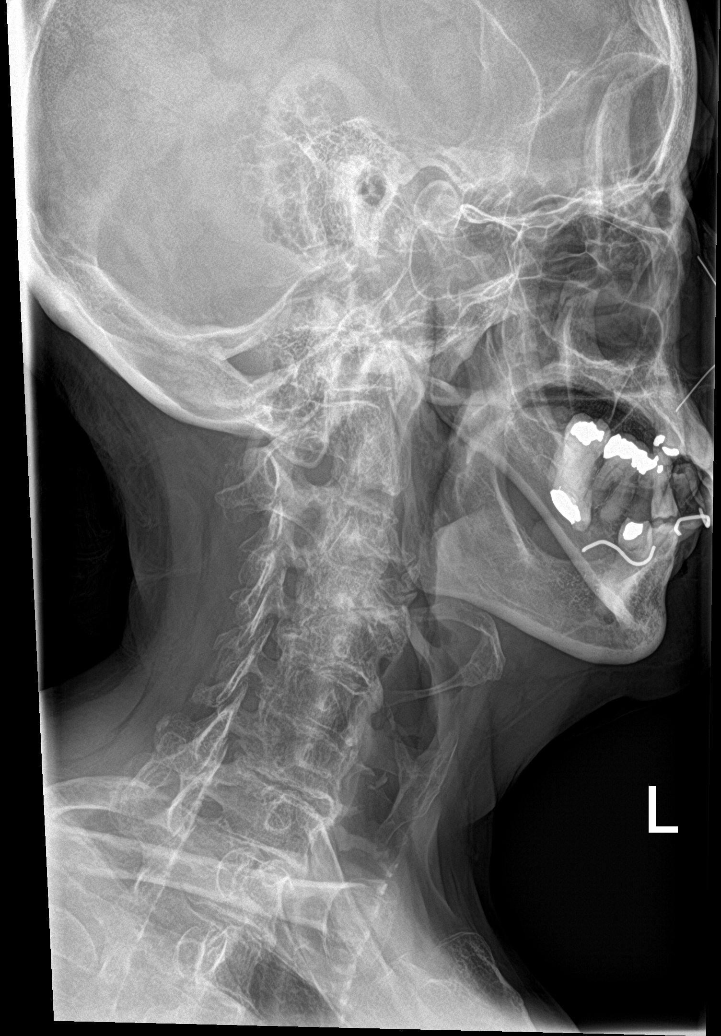

[c-spine obl (2 of 2)]
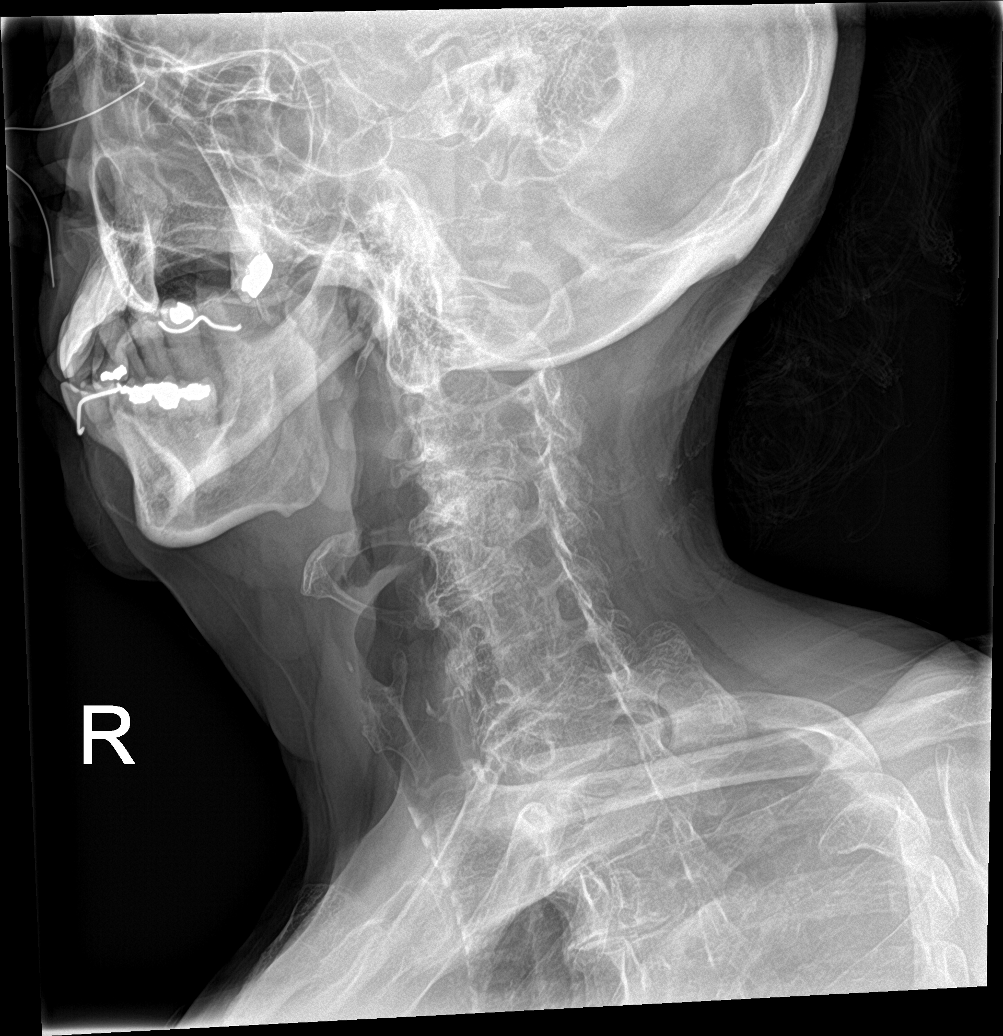

[c-spine ap]
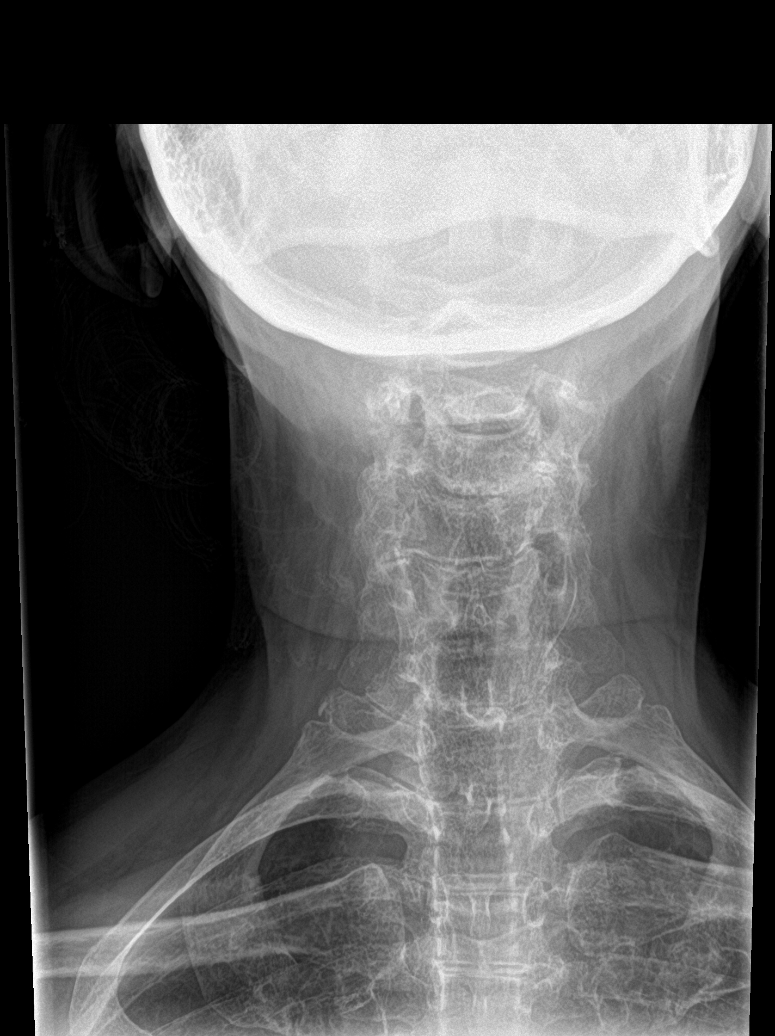

[c-spine open mouth]
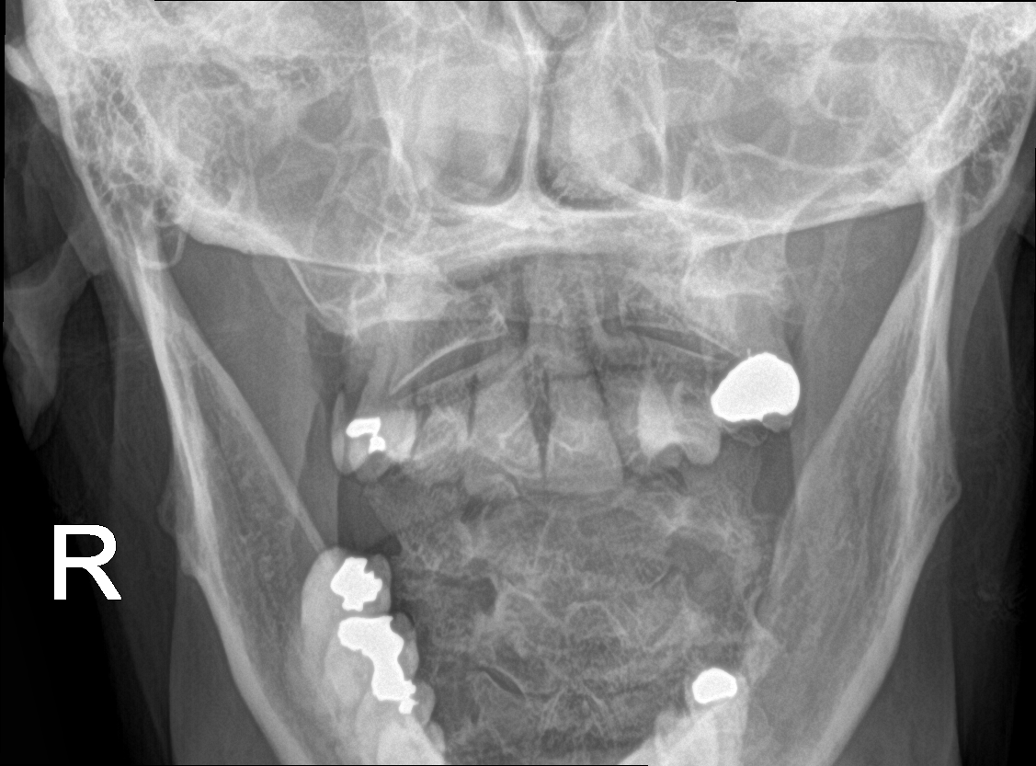

[5 of 5 positions shown; findings below may reference images not displayed]

FINDINGS: Straightening of normal lordosis. There is trace retrolisthesis of
C5 on C6. Partial fusion of C6 and C7 vertebral bodies may be
degenerative or congenital. The bones are diffusely under
mineralized. Disc space narrowing and endplate spurring from C3-C4
through C7-T1, near complete disc space loss at C6-C7. There is
multilevel facet hypertrophy. There is C4-C5 and C5-C6 foraminal
stenosis on the right and C3-C4 foraminal stenosis on the left, left
partially obscured due to positioning. No evidence of fracture,
focal bone lesion or bone destruction. No prevertebral soft tissue
edema.
IMPRESSION: 1. Multilevel degenerative disc disease and facet hypertrophy.
2. Partial fusion of C6 and C7 vertebral bodies may be degenerative
or congenital.
3. Trace retrolisthesis of C5 on C6. Bilateral foraminal stenosis,
right greater than left, as described.

## 2020-12-16 MED ORDER — GABAPENTIN 300 MG PO CAPS
ORAL_CAPSULE | ORAL | 0 refills | Status: DC
Start: 2020-12-16 — End: 2021-06-01

## 2020-12-16 NOTE — Patient Instructions (Addendum)
Ms. Katherine Haynes,   It was a pleasure to see you today. I am sorry you have not been feeling well.   I would like you to "wean off" your metoprolol. Please take 1/2 tablet twice a day x 1 week then discontinue.   I have restarted your gabapentin. I have prescribed 300 mg tablets. Please take 1 tablet at night for 1 week. Then increase to 2 tablets for 1 week. Then increase to 3 tablets. Once you finish this prescription I will send in a new prescription for the 900 mg tablets.   You can stop the venlafaxine.  I have ordered some x-rays of your neck and some blood work. I will call you with the results.   Otherwise, continue taking your other medications including lasix for now. Follow up with me again in 4 weeks.   If you have any questions or concerns, call our clinic at [phone] or after hours call [phone] and ask for the internal medicine resident on call.  Thank you!  Dr. Philipp Ovens

## 2020-12-16 NOTE — Progress Notes (Signed)
Subjective:   Patient ID: Katherine Haynes female   DOB: 03-15-1951 70 y.o.   MRN: [id number]  HPI: [name] is a 70 y.o. female with past medical history outlined below here for follow up of her chronic medical conditions and acute complaint of dizziness. For the details of today's visit, please refer to the assessment and plan.   Past Medical History:  Diagnosis Date  . Hyperlipidemia   . Hypertension   . Menopause syndrome    Current Outpatient Medications  Medication Sig Dispense Refill  . amLODipine (NORVASC) 10 MG tablet TAKE 1 TABLET(10 MG) BY MOUTH DAILY (Patient taking differently: Take 10 mg by mouth daily.) 90 tablet 3  . atorvastatin (LIPITOR) 40 MG tablet TAKE 1 TABLET(40 MG) BY MOUTH DAILY (Patient taking differently: Take 40 mg by mouth daily.) 90 tablet 1  . furosemide (LASIX) 20 MG tablet Take 3 tablets (60 mg total) by mouth daily. 90 tablet 11  . metoprolol tartrate (LOPRESSOR) 25 MG tablet Take 25 mg by mouth 2 (two) times daily.    . potassium chloride (KLOR-CON) 10 MEQ tablet Take 1 tablet (10 mEq total) by mouth daily. 30 tablet 11  . venlafaxine XR (EFFEXOR XR) 37.5 MG 24 hr capsule Take 1 capsule (37.5 mg total) by mouth daily. 30 capsule 2   No current facility-administered medications for this visit.   Family History  Problem Relation Age of Onset  . Heart disease Mother        "enlarged heart"  . Hypertension Mother   . Diabetes Mother   . Heart disease Maternal Uncle        Bypass surg  . Heart attack Maternal Grandmother        also had irregular heart beat requriring a pacemaker.  . Hypertension Other        In multiple relatives.   . Hypertension Paternal Grandmother   . COPD Sister   . Sarcoidosis Sister   . Heart block Sister        s/p PPM  . Hypertension Brother   . Hypertension Sister   . Arthritis Sister   . COPD Sister   . Diabetes Sister   . Hypertension Sister   . Arthritis Sister   . Hypertension Brother   .  Healthy Brother   . Healthy Brother   . Healthy Sister   . Breast cancer Neg Hx    Social History   Socioeconomic History  . Marital status: Married    Spouse name: Not on file  . Number of children: Not on file  . Years of education: Not on file  . Highest education level: Not on file  Occupational History  . Not on file  Tobacco Use  . Smoking status: Former Games developer  . Smokeless tobacco: Never Used  . Tobacco comment:  1 PPD for 2-3 years. Quit ~8295.  Vaping Use  . Vaping Use: Never used  Substance and Sexual Activity  . Alcohol use: No  . Drug use: No  . Sexual activity: Yes    Birth control/protection: Surgical  Other Topics Concern  . Not on file  Social History Narrative   Lives with husband. Good relationship, feels safe at home. Studying medical administration.       Current Social History 09/27/2018        Patient lives with spouse in an apartment on the second floor. There are 14 steps with handrails up to the entrance the patient uses.  Patient's method of transportation is personal car.      The highest level of education was high school diploma.      The patient currently is employed as a Engineer, site in patient's homes.      Identified important Relationships are "My husband, my kids, my grands and great grands."       Pets : None       Interests / Fun: Watching TV, going to beach and park, cooking out with family.       Current Stressors: "finances, teenagers" (grandkids)       Religious / Personal Beliefs: [employer], "I was raised to treat people the way you want to be treated.          Social Determinants of Health   Financial Resource Strain: Not on file  Food Insecurity: Not on file  Transportation Needs: Not on file  Physical Activity: Not on file  Stress: Not on file  Social Connections: Not on file    Review of Systems: Review of Systems  Respiratory: Negative for shortness of breath.   Cardiovascular: Negative for chest  pain.     Objective:  Physical Exam:  Vitals:   12/16/20 0959  BP: 102/64  Pulse: 60  Temp: 97.6 F (36.4 C)  TempSrc: Oral  SpO2: 100%  Weight: 148 lb 1.6 oz (67.2 kg)  Height: 5\' 6"  (1.676 m)    Physical Exam Constitutional:      Appearance: Normal appearance.  Cardiovascular:     Rate and Rhythm: Normal rate and regular rhythm.     Heart sounds: No murmur heard.   Pulmonary:     Effort: Pulmonary effort is normal.     Breath sounds: Normal breath sounds.  Musculoskeletal:     Right lower leg: Edema present.     Left lower leg: Edema present.  Skin:    General: Skin is warm and dry.  Neurological:     General: No focal deficit present.     Mental Status: She is alert and oriented to person, place, and time.      Assessment & Plan:   See Encounters Tab for problem based charting.

## 2020-12-16 NOTE — Progress Notes (Signed)
I called [facility] for colonoscopy report per Dr Philipp Ovens.

## 2020-12-17 LAB — BMP8+ANION GAP
Anion Gap: 14 mmol/L (ref 10.0–18.0)
BUN/Creatinine Ratio: 11 — ABNORMAL LOW (ref 12–28)
BUN: 10 mg/dL (ref 8–27)
CO2: 26 mmol/L (ref 20–29)
Calcium: 9.4 mg/dL (ref 8.7–10.3)
Chloride: 99 mmol/L (ref 96–106)
Creatinine, Ser: 0.91 mg/dL (ref 0.57–1.00)
Glucose: 113 mg/dL — ABNORMAL HIGH (ref 65–99)
Potassium: 4.2 mmol/L (ref 3.5–5.2)
Sodium: 139 mmol/L (ref 134–144)
eGFR: 69 mL/min/{1.73_m2} (ref >59)

## 2020-12-17 NOTE — Assessment & Plan Note (Signed)
Patient here with fatigue, sluggishness, and dizziness in the setting of new LBBB and metoprolol use. We are discontinuing this (see HTN A&P). Orthostatics were checked today as well which were negative. BMP with normal potassium. I think it is reasonable to continue her lasix for now. If symptoms do not improve off metoprolol, consider stopping lasix as well. Continue 10 mEq potassium daily.

## 2020-12-17 NOTE — Assessment & Plan Note (Signed)
Patient developed neck pain in February of this year. Initially treated conservatively, felt to be a cervical neck sprain. Unfortunately her neck pain has persisted and she is now experiencing bilateral hand paresthesias. Cervical plain films were obtained today with multiple abnormal findings listed below:   1. Multilevel degenerative disc disease and facet hypertrophy. 2. Partial fusion of C6 and C7 vertebral bodies may be degenerative or congenital. 3. Trace retrolisthesis of C5 on C6. Bilateral foraminal stenosis, right greater than left, as described.  I am concerned for nerve root impingement with her foraminal stenosis at C5-C6. Discussed with patient, will proceed with MRI. Neurosurgery referral pending results.

## 2020-12-17 NOTE — Assessment & Plan Note (Signed)
Noted to have new LBBB during recent admission for chest pain. Echocardiogram unremarkable, she also had a low risk NM stress test. She has follow up scheduled with cardiology on 01/11/21. Weaning off metoprolol today.

## 2020-12-17 NOTE — Assessment & Plan Note (Signed)
Patient has persistent bilateral LE edema. Reviewed cardiology's last note. Agree this could be from her amlodipine. This was stopped for awhile without improvement, then added back due to limited options for therapy.  She was unable to tolerate ACEi due to cough and previously experienced orthostatic hypotension with HCTZ. We are tapering off her metoprolol today due to dizziness and new LBBB, so I will wait to make further adjustments to her regimen. Consider ARB trial at follow up. Continue lasix for now.

## 2020-12-17 NOTE — Assessment & Plan Note (Signed)
Blood pressure to day is well controlled, 102/64 with HR of 60. However she has been experiencing symptoms of dizziness. Orthostatics today were negative. She also reports associated fatigue and feeling sluggish. She had a recent admission for chest pain and ACS rule out, found to have a new left bundle branch block on ECG. Cardiology had recommended weaning off metoprolol after completion of her echocardiogram. However patient was confused about the instructions. Held for her echo then restarted taking. Instructed patient to wean off metoprolol, take 1/2 dose (12.5 mg BID) x 1 week then stop. Continue amlodipine 10 mg daily.

## 2020-12-17 NOTE — Assessment & Plan Note (Signed)
Recently changed from gabapentin qhs to daily venlafaxine due to worsening symptoms. She does not feel like the venlafaxine is helping and would like to go back on gabapentin. Discussed the option of uptitrating venlafaxine vs. Continuing low dose with gabapentin, but she would like to reduce pill burden as much as possible. Plan to d/c venlafaxine. Restart gabapentin. Provided instructions with how to up titrate back to her prior dose of 900 mg QHS. Will send new prescription for the 900 mg tablets at her next visit.

## 2020-12-22 ENCOUNTER — Other Ambulatory Visit: Payer: Self-pay | Admitting: Internal Medicine

## 2020-12-22 DIAGNOSIS — E785 Hyperlipidemia, unspecified: Secondary | ICD-10-CM

## 2020-12-22 DIAGNOSIS — R600 Localized edema: Secondary | ICD-10-CM

## 2020-12-23 MED ORDER — FUROSEMIDE 20 MG PO TABS: 60.0000 mg | ORAL_TABLET | Freq: Every day | ORAL | 11 refills | Status: DC

## 2020-12-28 ENCOUNTER — Ambulatory Visit: Payer: Self-pay | Admitting: Neurology

## 2020-12-30 ENCOUNTER — Telehealth: Payer: Self-pay

## 2020-12-30 DIAGNOSIS — M542 Cervicalgia: Secondary | ICD-10-CM

## 2020-12-30 MED ORDER — NAPROXEN 500 MG PO TABS: 500.0000 mg | ORAL_TABLET | Freq: Two times a day (BID) | ORAL | 2 refills | Status: DC

## 2020-12-30 NOTE — Telephone Encounter (Signed)
Refill for naproxen approved. Stacee, I ordered an MRI of her cervical spine after her last visit on 12/17/20. Can you Follow up on this? I don't see that anything has been scheduled. Thanks!

## 2020-12-30 NOTE — Telephone Encounter (Signed)
Received a fax from patients pharmacy for a refill request for Naproxen 500mg . This RX was from an office visit on 5/4 and has fallen of her med list. Patient has an upcoming appt w/ PCP on 6/29 Will forward to Dr. Philipp Ovens to advise Thank you, SChaplin, RN,BSN

## 2020-12-30 NOTE — Telephone Encounter (Signed)
Hi Chili,  Dr. Philipp Ovens asked me to f/u on the MRI/Cervical Spine she ordered after the last office visit on 12/17/20.  I see where it is ordered.  Do you if this is waiting for approval from her insurance? I think Dr. Philipp Ovens is trying to get this scheduled before the patients next office visit with her which is scheduled for 01/13/21. Thank you, Lataisha Colan

## 2020-12-31 NOTE — Telephone Encounter (Signed)
Thank you :)

## 2020-12-31 NOTE — Telephone Encounter (Signed)
Thank you Chili,  Dr Philipp Ovens, The patient is scheduled on 01/12/21 @ 1600.   Thanks, [employer]

## 2021-01-11 ENCOUNTER — Ambulatory Visit: Payer: Medicare Other | Admitting: Cardiology

## 2021-01-11 NOTE — Addendum Note (Signed)
Addended by: Jodean Lima on: 01/11/2021 01:34 PM   Modules accepted: Orders

## 2021-01-12 ENCOUNTER — Other Ambulatory Visit: Payer: Self-pay

## 2021-01-12 ENCOUNTER — Ambulatory Visit (HOSPITAL_COMMUNITY)
Admission: RE | Admit: 2021-01-12 | Discharge: 2021-01-12 | Disposition: A | Discharge status: Home / Self Care | Payer: Medicare Other | Source: Ambulatory Visit | Attending: Internal Medicine | Admitting: Internal Medicine

## 2021-01-12 ENCOUNTER — Other Ambulatory Visit: Payer: Self-pay | Admitting: Internal Medicine

## 2021-01-12 DIAGNOSIS — M542 Cervicalgia: Secondary | ICD-10-CM

## 2021-01-12 DIAGNOSIS — M50121 Cervical disc disorder at C4-C5 level with radiculopathy: Secondary | ICD-10-CM | POA: Diagnosis not present

## 2021-01-12 DIAGNOSIS — M4802 Spinal stenosis, cervical region: Secondary | ICD-10-CM | POA: Diagnosis not present

## 2021-01-12 DIAGNOSIS — M50123 Cervical disc disorder at C6-C7 level with radiculopathy: Secondary | ICD-10-CM | POA: Diagnosis not present

## 2021-01-12 DIAGNOSIS — M5011 Cervical disc disorder with radiculopathy,  high cervical region: Secondary | ICD-10-CM | POA: Diagnosis not present

## 2021-01-12 IMAGING — MR MR CERVICAL SPINE W/O CM
4 of 7 series · 18 of 48 positions shown · non-contrast
Comparison: None.

CLINICAL DATA: Cervical radiculopathy

EXAM:
MRI CERVICAL SPINE WITHOUT CONTRAST
TECHNIQUE: Multiplanar, multisequence MR imaging of the cervical spine was
performed. No intravenous contrast was administered.

[Series 2: T2 · sagittal · 3.0mm · 0.35mm/px · 4 of 16 slices shown (1 of 2)]
[im 1/16]
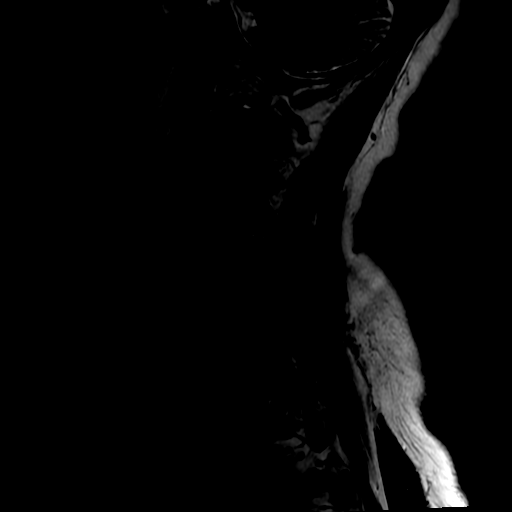
[im 6/16]
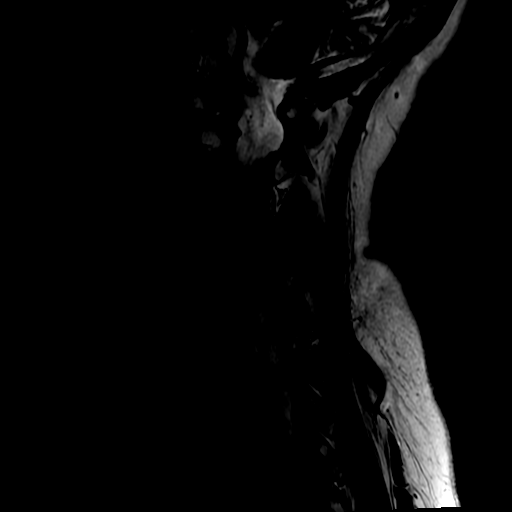
[im 11/16]
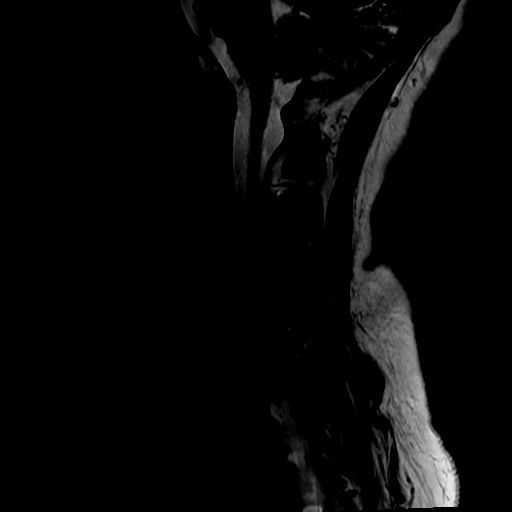
[im 16/16]
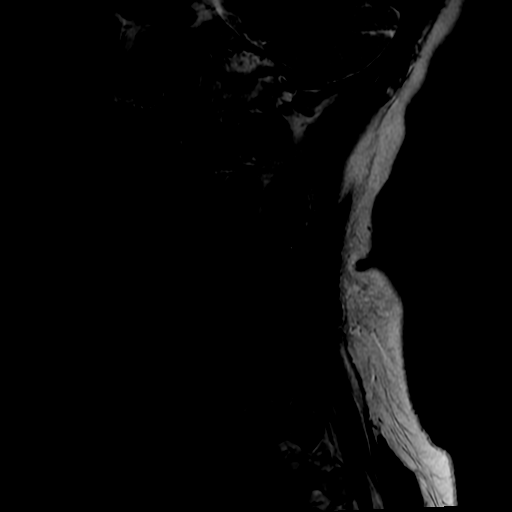

[Series 6: STIR · sagittal · 3.0mm · 0.31mm/px · 3 of 16 slices shown]
[im 1/16]
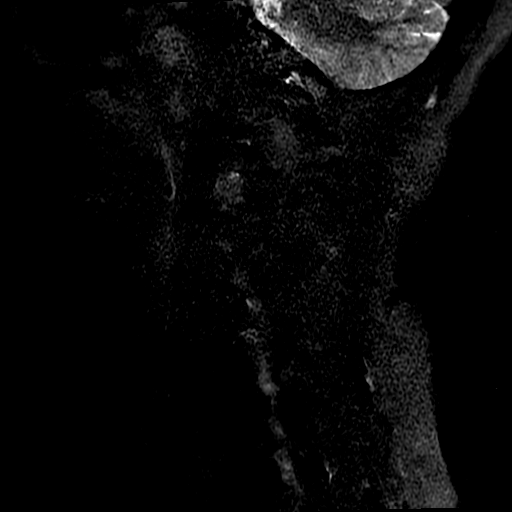
[im 8/16]
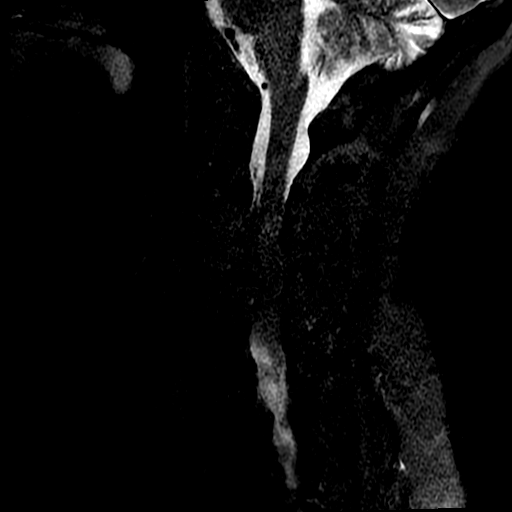
[im 16/16]
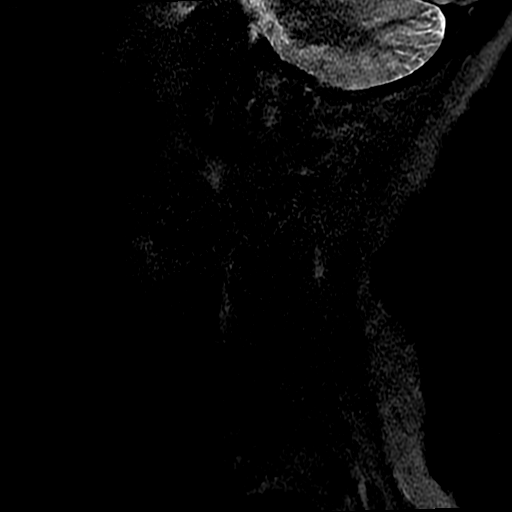

[Series 8: T2 · axial · 3.0mm · 0.35mm/px · z∈[-148,-32]mm · 7 of 38 slices shown (2 of 2)]
[im 1/38]
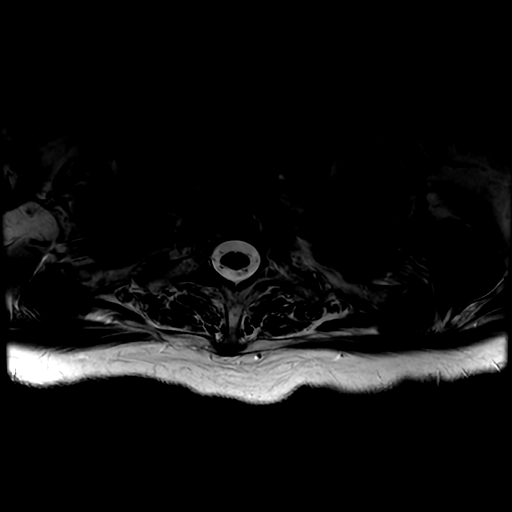
[im 7/38]
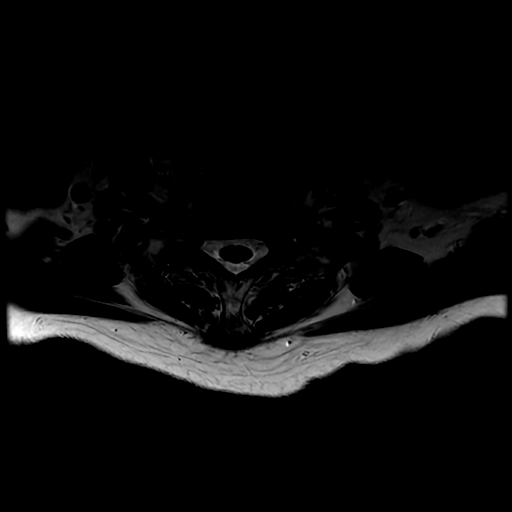
[im 13/38]
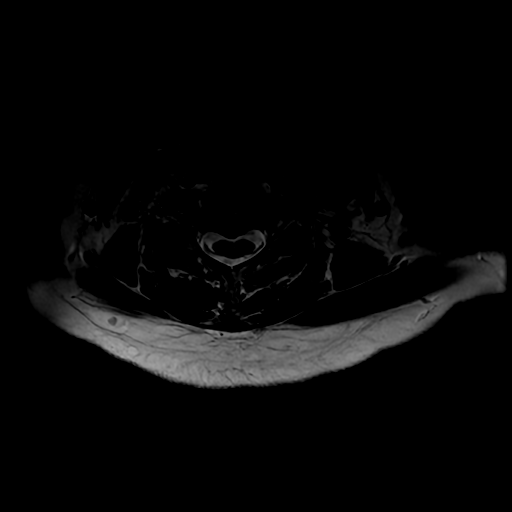
[im 19/38]
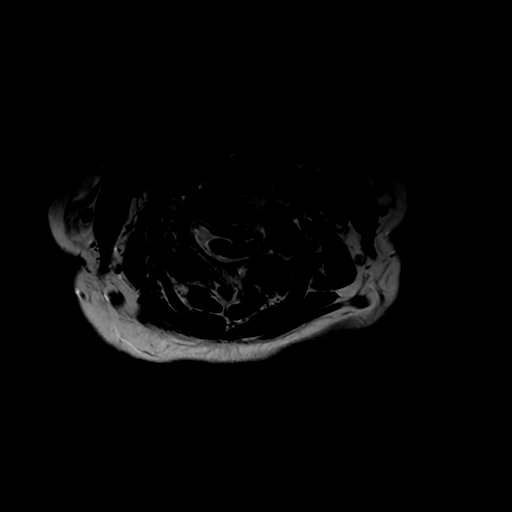
[im 25/38]
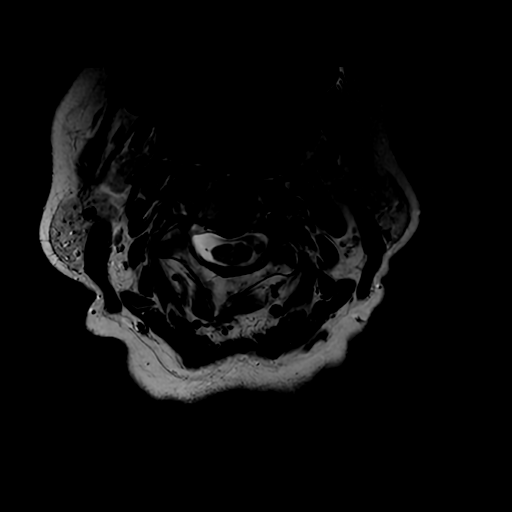
[im 31/38]
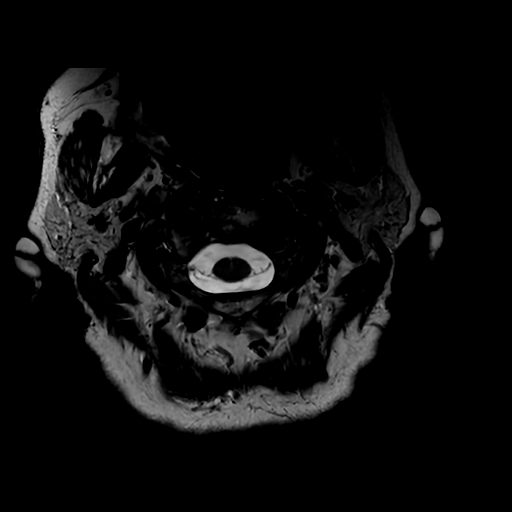
[im 38/38]
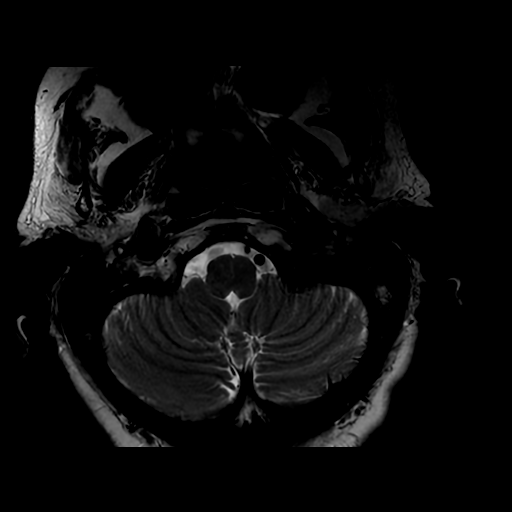

[Series 9: T1 · axial · non-contrast · 3.0mm · 0.35mm/px · z∈[-148,-53]mm · 4 of 39 slices shown]
[im 1/39]
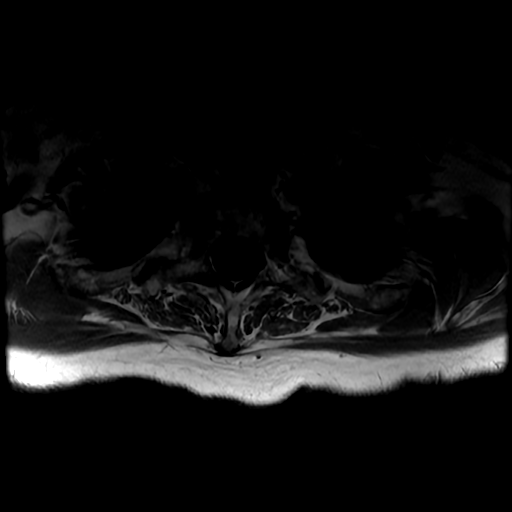
[im 7/39]
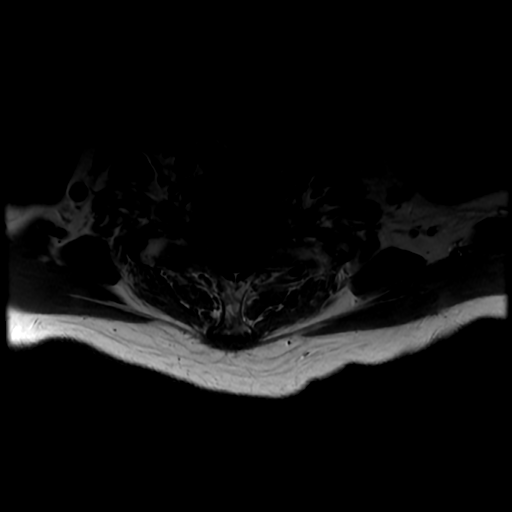
[im 20/39]
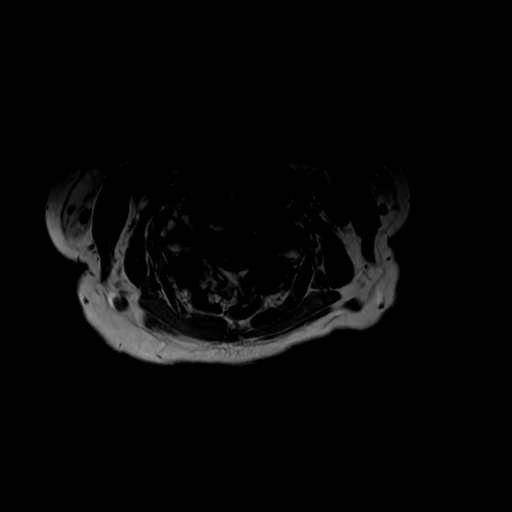
[im 32/39]
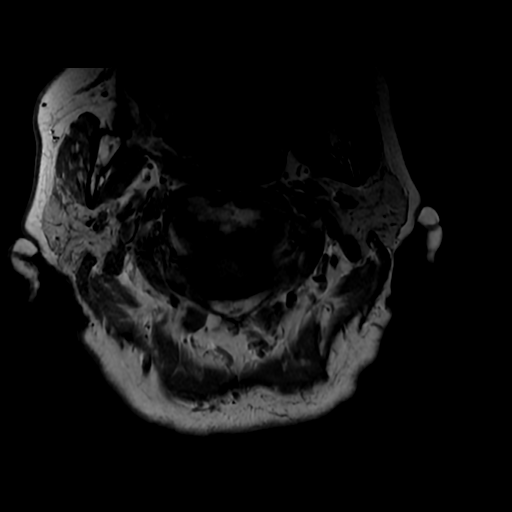

[18 of 48 positions shown; findings below may reference images not displayed]

FINDINGS: Motion degraded.

Alignment: No significant listhesis.

Vertebrae: Multilevel degenerative endplate irregularity. Limited
evaluation for marrow edema due to artifact.

Cord: Suspect subtle abnormal cord T2 hyperintensity at the C3-C4
level

Posterior Fossa, vertebral arteries, paraspinal tissues:
Unremarkable.

Disc levels:

C2-C3:  Central disc protrusion.  No canal or foraminal stenosis.

C3-C4: Disc bulge with endplate osteophytes eccentric to the right.
Right greater than left uncovertebral and facet hypertrophy. Marked
canal stenosis with cord flattening. Marked right foraminal
stenosis. No left foraminal stenosis.

C4-C5: Disc bulge with superimposed left central disc protrusion and
endplate osteophytes. Uncovertebral and facet hypertrophy. Moderate
canal stenosis eccentric to the left with flattening of the left
ventral cord. Mild right and marked left foraminal stenosis.

C5-C6: Disc bulge with superimposed right central disc protrusion
and endplate osteophytes. Minor canal stenosis. No foraminal
stenosis.

C6-C7: Disc bulge with superimposed central disc protrusion and
endplate osteophytes. Uncovertebral hypertrophy. Minor canal
stenosis. No foraminal stenosis.

C7-T1: Disc bulge with endplate osteophytes. Uncovertebral
hypertrophy. No canal stenosis. Minor right and moderate left
foraminal stenosis.
IMPRESSION: Motion degraded with suboptimal evaluation.

Multilevel degenerative changes as detailed above. There is marked
canal stenosis at C3-C4 with cord flattening and question of subtle
abnormal cord signal. Foraminal stenosis is greatest on the right at
C3-C4 and on the left at C4-C5.

These results were called by telephone at the time of interpretation
acknowledged these results.

## 2021-01-13 ENCOUNTER — Ambulatory Visit (INDEPENDENT_AMBULATORY_CARE_PROVIDER_SITE_OTHER): Payer: Medicare Other | Admitting: Internal Medicine

## 2021-01-13 ENCOUNTER — Telehealth: Payer: Self-pay | Admitting: *Deleted

## 2021-01-13 ENCOUNTER — Encounter: Payer: Self-pay | Admitting: Internal Medicine

## 2021-01-13 VITALS — BP 115/60 | HR 88 | Temp 97.7°F | Ht 66.0 in | Wt 152.9 lb

## 2021-01-13 DIAGNOSIS — G959 Disease of spinal cord, unspecified: Secondary | ICD-10-CM | POA: Diagnosis not present

## 2021-01-13 DIAGNOSIS — M5412 Radiculopathy, cervical region: Secondary | ICD-10-CM | POA: Diagnosis not present

## 2021-01-13 NOTE — Assessment & Plan Note (Addendum)
Patient here for follow up of her neck pain with radiculopathy and recent MRI. Patient first developed neck pain in February of this year. It was managed conservatively unfortunately did not improve. Two months ago, she developed radicular symptoms which have progressed, now with upper extremity weakness. MRI cervical spine was done yesterday:   IMPRESSION: Motion degraded with suboptimal evaluation.  Multilevel degenerative changes as detailed above. There is marked canal stenosis at C3-C4 with cord flattening and question of subtle abnormal cord signal. Foraminal stenosis is greatest on the right at C3-C4 and on the left at C4-C5.  On exam today, she continues to have bilateral radicular pain but R>L. She reports worsening weakness and is frequently dropping things at work. On exam she has weakness of her distal RUE with 4/5 strength compared to 5/5 on the left. She is very distressed by her symptoms and feels it is significantly impacting her quality of life. She leads a fairly active lifestyle and works as a Database administrator. She has been unable to perform a lot of her job duties because of this. We discussed her MRI findings and I have placed an urgent referral to neurosurgery. I have also provided her with a work note requesting light duty on return.   As far as her perioperative risk, she is low risk for MACE. She is a 70 yo F with a pmhx of HTN and HLD, both optimally controlled on medication. She was experiencing some atypical chest pain recently, that in retrospect was more likely related to her radicular symptoms, but had a low risk NM stress test last month with no reversible ischemia. From my standpoint she is medically cleared if surgery is indicated. Greatly appreciate NSG recommedations.

## 2021-01-13 NOTE — Patient Instructions (Addendum)
Katherine Haynes,   It was a pleasure to see you. Please avoid any heavy lifting. I have placed a referral to neurosurgery, you will be called to schedule this appointment.   If you have any questions or concerns, call our clinic at [phone] or after hours call [phone] and ask for the internal medicine resident on call.   Thank you!  Dr. Darnell Level

## 2021-01-13 NOTE — Telephone Encounter (Signed)
Opal Sidles with [facility] called with following report:  IMPRESSION: Motion degraded with suboptimal evaluation.   Multilevel degenerative changes as detailed above. There is marked canal stenosis at C3-C4 with cord flattening and question of subtle abnormal cord signal. Foraminal stenosis is greatest on the right at C3-C4 and on the left at C4-C5.

## 2021-01-13 NOTE — Progress Notes (Signed)
Subjective:   Patient ID: Katherine Haynes female   DOB: [birth year] 70 y.o.   MRN: [id number]  HPI: [name] is a 70 y.o. female with past medical history outlined below here for follow up of cervical radiculopathy. For the details of today's visit, please refer to the assessment and plan.   Past Medical History:  Diagnosis Date   Hyperlipidemia    Hypertension    Menopause syndrome    Current Outpatient Medications  Medication Sig Dispense Refill   amLODipine (NORVASC) 10 MG tablet TAKE 1 TABLET(10 MG) BY MOUTH DAILY (Patient taking differently: Take 10 mg by mouth daily.) 90 tablet 3   atorvastatin (LIPITOR) 40 MG tablet Take 1 tablet (40 mg total) by mouth daily. 90 tablet 3   furosemide (LASIX) 20 MG tablet Take 3 tablets (60 mg total) by mouth daily. 90 tablet 11   gabapentin (NEURONTIN) 300 MG capsule Please take 1 capsule at night x 1 week, then increase to 2 capsules at night x 2 weeks, then 3 capsules at night 63 capsule 0   metoprolol tartrate (LOPRESSOR) 25 MG tablet Take 25 mg by mouth 2 (two) times daily.     naproxen (NAPROSYN) 500 MG tablet Take 1 tablet (500 mg total) by mouth 2 (two) times daily with a meal. 60 tablet 2   potassium chloride (KLOR-CON) 10 MEQ tablet Take 1 tablet (10 mEq total) by mouth daily. 30 tablet 11   No current facility-administered medications for this visit.   Family History  Problem Relation Age of Onset   Heart disease Mother        "enlarged heart"   Hypertension Mother    Diabetes Mother    Heart disease Maternal Uncle        Bypass surg   Heart attack Maternal Grandmother        also had irregular heart beat requriring a pacemaker.   Hypertension Other        In multiple relatives.    Hypertension Paternal Grandmother    COPD Sister    Sarcoidosis Sister    Heart block Sister        s/p PPM   Hypertension Brother    Hypertension Sister    Arthritis Sister    COPD Sister    Diabetes Sister    Hypertension Sister     Arthritis Sister    Hypertension Brother    Healthy Brother    Healthy Brother    Healthy Sister    Breast cancer Neg Hx    Social History   Socioeconomic History   Marital status: Married    Spouse name: Not on file   Number of children: Not on file   Years of education: Not on file   Highest education level: Not on file  Occupational History   Not on file  Tobacco Use   Smoking status: Former    Pack years: 0.00   Smokeless tobacco: Never   Tobacco comments:     1 PPD for 2-3 years. Quit ~4098.  Vaping Use   Vaping Use: Never used  Substance and Sexual Activity   Alcohol use: No   Drug use: No   Sexual activity: Yes    Birth control/protection: Surgical  Other Topics Concern   Not on file  Social History Narrative   Lives with husband. Good relationship, feels safe at home. Studying medical administration.       Current Social History 09/27/2018  Patient lives with spouse in an apartment on the second floor. There are 14 steps with handrails up to the entrance the patient uses.       Patient's method of transportation is personal car.      The highest level of education was high school diploma.      The patient currently is employed as a Engineer, site in patient's homes.      Identified important Relationships are "My husband, my kids, my grands and great grands."       Pets : None       Interests / Fun: Watching TV, going to beach and park, cooking out with family.       Current Stressors: "finances, teenagers" (grandkids)       Religious / Personal Beliefs: Methodist, "I was raised to treat people the way you want to be treated.          Social Determinants of Health   Financial Resource Strain: Not on file  Food Insecurity: Not on file  Transportation Needs: Not on file  Physical Activity: Not on file  Stress: Not on file  Social Connections: Not on file    Review of Systems: Review of Systems  Musculoskeletal:  Positive for neck  pain.  Neurological:  Positive for tingling and focal weakness.    Objective:  Physical Exam:  Vitals:   01/13/21 0957  BP: 115/60  Pulse: 88  Temp: 97.7 F (36.5 C)  TempSrc: Oral  SpO2: 100%  Weight: 152 lb 14.4 oz (69.4 kg)  Height: 5\' 6"  (1.676 m)    Physical Exam Constitutional:      Appearance: Normal appearance. She is normal weight.  Pulmonary:     Effort: Pulmonary effort is normal.  Neurological:     Mental Status: She is alert and oriented to person, place, and time.     Comments: Right upper extremity with distal weakness, 4/5 compared to 5/5 on the left.   Psychiatric:        Mood and Affect: Mood normal.        Behavior: Behavior normal.     Assessment & Plan:   See Encounters Tab for problem based charting.

## 2021-01-19 ENCOUNTER — Encounter: Payer: Self-pay | Admitting: *Deleted

## 2021-01-21 DIAGNOSIS — I10 Essential (primary) hypertension: Secondary | ICD-10-CM | POA: Diagnosis not present

## 2021-01-21 DIAGNOSIS — M5000 Cervical disc disorder with myelopathy, unspecified cervical region: Secondary | ICD-10-CM | POA: Diagnosis not present

## 2021-02-02 DIAGNOSIS — M5011 Cervical disc disorder with radiculopathy,  high cervical region: Secondary | ICD-10-CM | POA: Diagnosis not present

## 2021-02-02 DIAGNOSIS — G9529 Other cord compression: Secondary | ICD-10-CM | POA: Diagnosis not present

## 2021-02-02 DIAGNOSIS — M5001 Cervical disc disorder with myelopathy,  high cervical region: Secondary | ICD-10-CM | POA: Diagnosis not present

## 2021-02-02 DIAGNOSIS — M5412 Radiculopathy, cervical region: Secondary | ICD-10-CM | POA: Diagnosis not present

## 2021-02-02 DIAGNOSIS — M4802 Spinal stenosis, cervical region: Secondary | ICD-10-CM | POA: Diagnosis not present

## 2021-02-09 ENCOUNTER — Other Ambulatory Visit: Payer: Self-pay

## 2021-02-09 ENCOUNTER — Telehealth: Payer: Self-pay

## 2021-02-09 ENCOUNTER — Emergency Department (HOSPITAL_COMMUNITY)
Admission: EM | Admit: 2021-02-09 | Discharge: 2021-02-09 | Disposition: A | Discharge status: Home / Self Care | Payer: Medicare Other | Attending: Emergency Medicine | Admitting: Emergency Medicine

## 2021-02-09 ENCOUNTER — Emergency Department (HOSPITAL_COMMUNITY): Payer: Medicare Other

## 2021-02-09 DIAGNOSIS — M542 Cervicalgia: Secondary | ICD-10-CM | POA: Diagnosis not present

## 2021-02-09 DIAGNOSIS — E860 Dehydration: Secondary | ICD-10-CM | POA: Insufficient documentation

## 2021-02-09 DIAGNOSIS — I10 Essential (primary) hypertension: Secondary | ICD-10-CM | POA: Diagnosis not present

## 2021-02-09 DIAGNOSIS — R519 Headache, unspecified: Secondary | ICD-10-CM | POA: Insufficient documentation

## 2021-02-09 DIAGNOSIS — R55 Syncope and collapse: Secondary | ICD-10-CM

## 2021-02-09 DIAGNOSIS — Z9889 Other specified postprocedural states: Secondary | ICD-10-CM | POA: Diagnosis not present

## 2021-02-09 DIAGNOSIS — R42 Dizziness and giddiness: Secondary | ICD-10-CM | POA: Diagnosis not present

## 2021-02-09 DIAGNOSIS — Z743 Need for continuous supervision: Secondary | ICD-10-CM | POA: Diagnosis not present

## 2021-02-09 DIAGNOSIS — R000 Tachycardia, unspecified: Secondary | ICD-10-CM | POA: Diagnosis not present

## 2021-02-09 DIAGNOSIS — Z87891 Personal history of nicotine dependence: Secondary | ICD-10-CM | POA: Diagnosis not present

## 2021-02-09 DIAGNOSIS — R221 Localized swelling, mass and lump, neck: Secondary | ICD-10-CM | POA: Diagnosis not present

## 2021-02-09 DIAGNOSIS — Z79899 Other long term (current) drug therapy: Secondary | ICD-10-CM | POA: Diagnosis not present

## 2021-02-09 DIAGNOSIS — R531 Weakness: Secondary | ICD-10-CM | POA: Insufficient documentation

## 2021-02-09 DIAGNOSIS — I6529 Occlusion and stenosis of unspecified carotid artery: Secondary | ICD-10-CM | POA: Diagnosis not present

## 2021-02-09 DIAGNOSIS — G8918 Other acute postprocedural pain: Secondary | ICD-10-CM | POA: Diagnosis not present

## 2021-02-09 DIAGNOSIS — W19XXXA Unspecified fall, initial encounter: Secondary | ICD-10-CM | POA: Diagnosis not present

## 2021-02-09 DIAGNOSIS — R404 Transient alteration of awareness: Secondary | ICD-10-CM | POA: Diagnosis not present

## 2021-02-09 LAB — COMPREHENSIVE METABOLIC PANEL
ALT: 36 U/L (ref 0–44)
AST: 52 U/L — ABNORMAL HIGH (ref 15–41)
Albumin: 3.7 g/dL (ref 3.5–5.0)
Alkaline Phosphatase: 84 U/L (ref 38–126)
Anion gap: 10 (ref 5–15)
BUN: 15 mg/dL (ref 8–23)
CO2: 31 mmol/L (ref 22–32)
Calcium: 9.4 mg/dL (ref 8.9–10.3)
Chloride: 93 mmol/L — ABNORMAL LOW (ref 98–111)
Creatinine, Ser: 0.93 mg/dL (ref 0.44–1.00)
GFR, Estimated: >60 mL/min (ref >60)
Glucose, Bld: 113 mg/dL — ABNORMAL HIGH (ref 70–99)
Potassium: 3.1 mmol/L — ABNORMAL LOW (ref 3.5–5.1)
Sodium: 134 mmol/L — ABNORMAL LOW (ref 135–145)
Total Bilirubin: 1 mg/dL (ref 0.3–1.2)
Total Protein: 7.6 g/dL (ref 6.5–8.1)

## 2021-02-09 LAB — CBC WITH DIFFERENTIAL/PLATELET
Abs Immature Granulocytes: 0.01 10*3/uL (ref 0.00–0.07)
Basophils Absolute: 0 10*3/uL (ref 0.0–0.1)
Basophils Relative: 1 %
Eosinophils Absolute: 0.2 10*3/uL (ref 0.0–0.5)
Eosinophils Relative: 3 %
HCT: 41.3 % (ref 36.0–46.0)
Hemoglobin: 14.2 g/dL (ref 12.0–15.0)
Immature Granulocytes: 0 %
Lymphocytes Relative: 35 %
Lymphs Abs: 2 10*3/uL (ref 0.7–4.0)
MCH: 32.1 pg (ref 26.0–34.0)
MCHC: 34.4 g/dL (ref 30.0–36.0)
MCV: 93.2 fL (ref 80.0–100.0)
Monocytes Absolute: 0.6 10*3/uL (ref 0.1–1.0)
Monocytes Relative: 11 %
Neutro Abs: 3 10*3/uL (ref 1.7–7.7)
Neutrophils Relative %: 50 %
Platelets: 342 10*3/uL (ref 150–400)
RBC: 4.43 MIL/uL (ref 3.87–5.11)
RDW: 11.7 % (ref 11.5–15.5)
WBC: 5.8 10*3/uL (ref 4.0–10.5)
nRBC: 0 % (ref 0.0–0.2)

## 2021-02-09 IMAGING — CT CT NECK W/O CM
4 of 6 series · 13 of 33 positions shown, 15 images · non-contrast
Comparison: Correlation made with prior MRI cervical spine
[DATE]

CLINICAL DATA: Postoperative pain, recent cervical fusion

EXAM:
CT NECK WITHOUT CONTRAST
TECHNIQUE: Multidetector CT imaging of the neck was performed following the
standard protocol without intravenous contrast.

[Series 3: axial neck (person_name) · axial · 0.37mm/px · z∈[+1022,+1095]mm · 2 of 132 slices shown]
[im 44/132  bone]
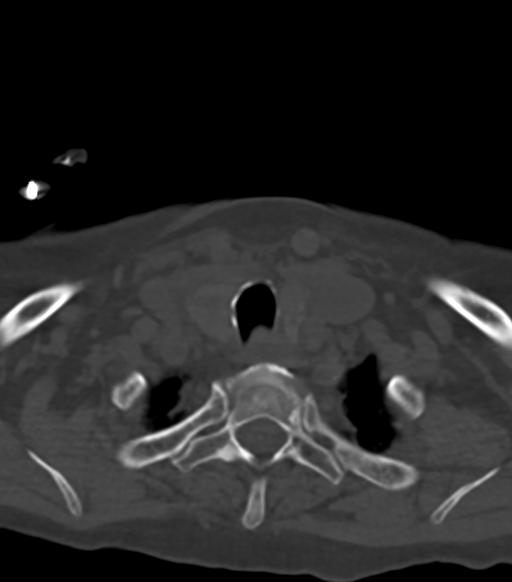
[im 88/132  bone]
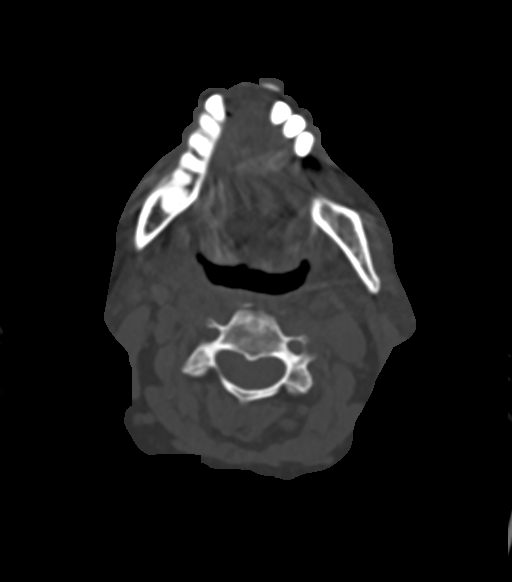

[Series 5: axial bone (person_name) · axial · 0.35mm/px · z∈[+1008,+1118]mm · 3 of 133 slices shown, 4 images]
[im 34/133  soft-tissue]
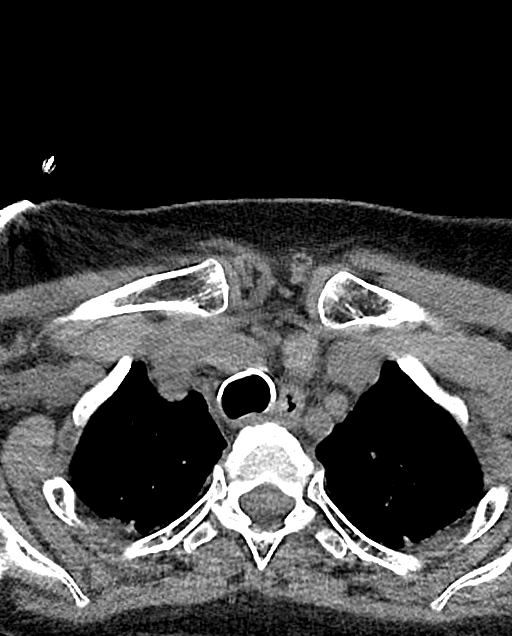
[im 34/133  bone]
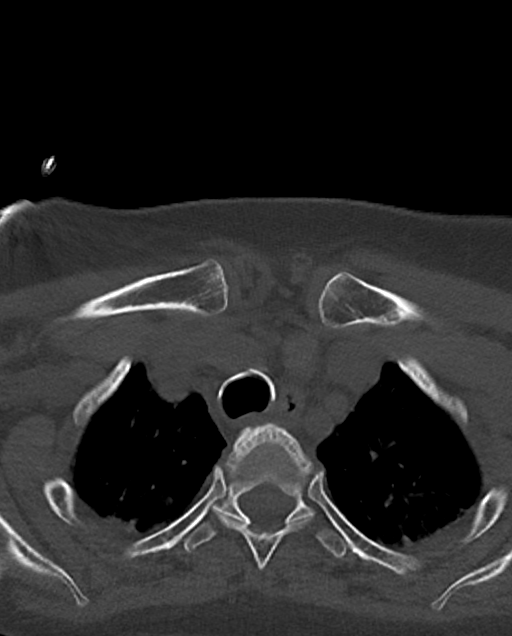
[im 67/133  bone]
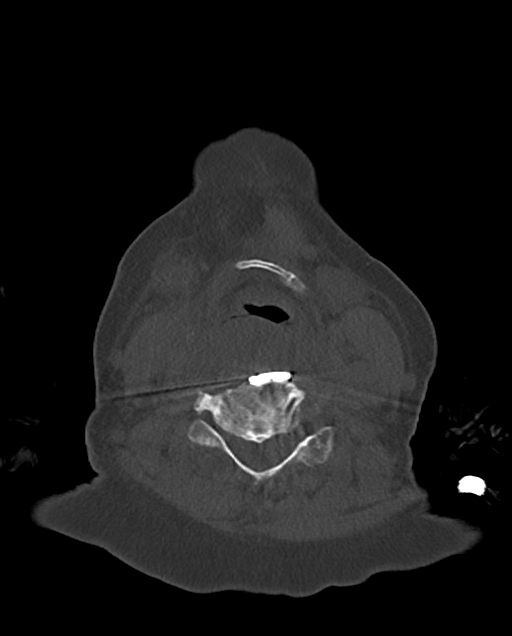
[im 100/133  bone]
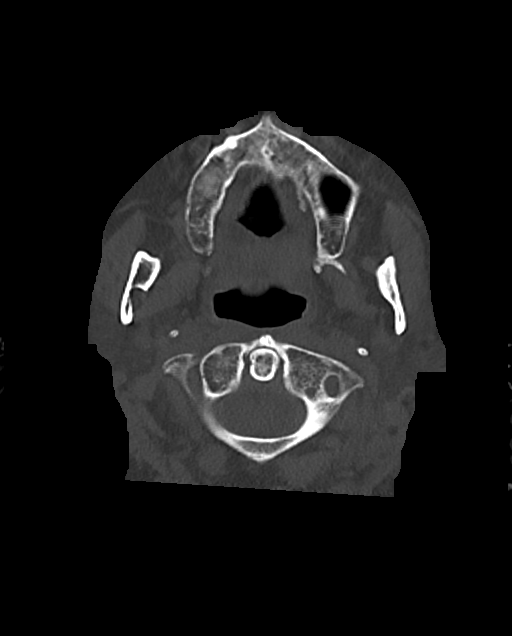

[Series 6: sag neck (person_name) · sagittal · 0.44mm/px · 5 of 88 slices shown, 6 images]
[im 30/88  bone]
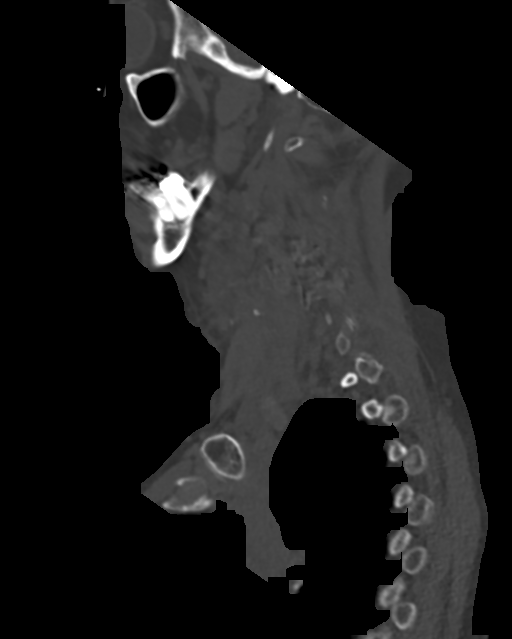
[im 37/88  bone]
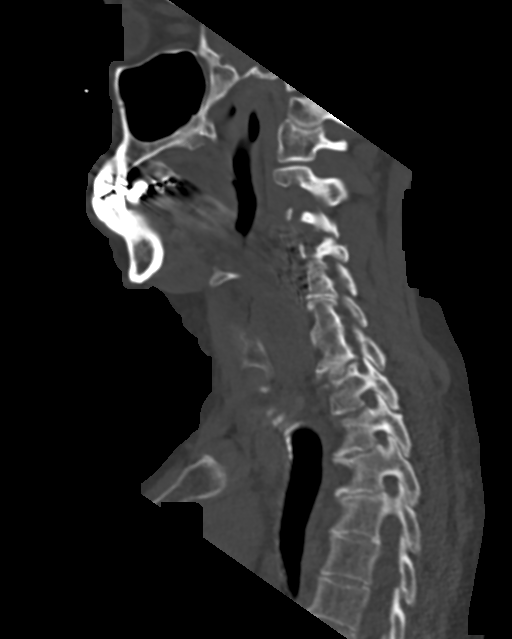
[im 44/88  soft-tissue]
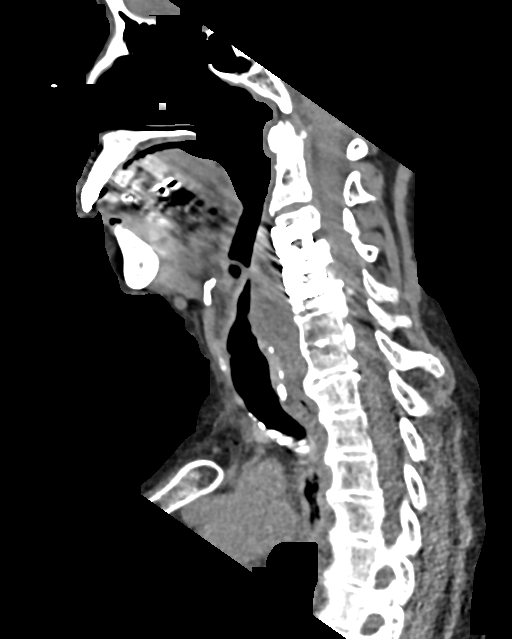
[im 44/88  bone]
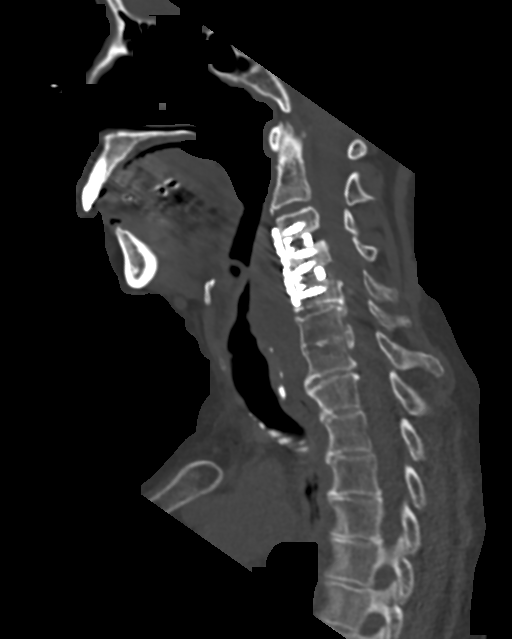
[im 51/88  bone]
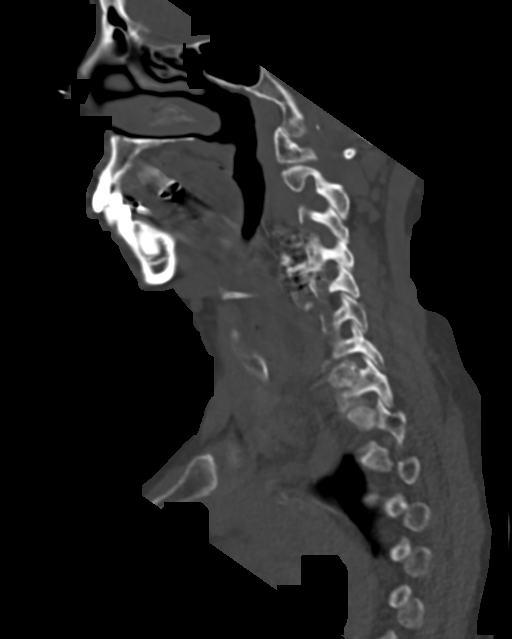
[im 59/88  bone]
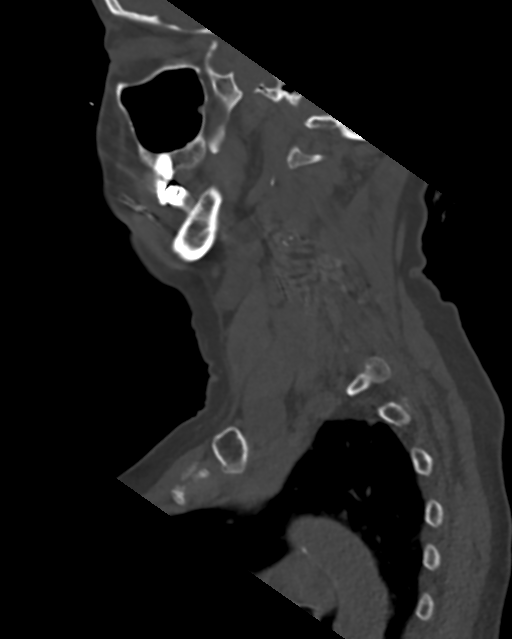

[Series 7: cor neck (person_name) · coronal · 0.36mm/px · 3 of 111 slices shown]
[im 43/111  bone]
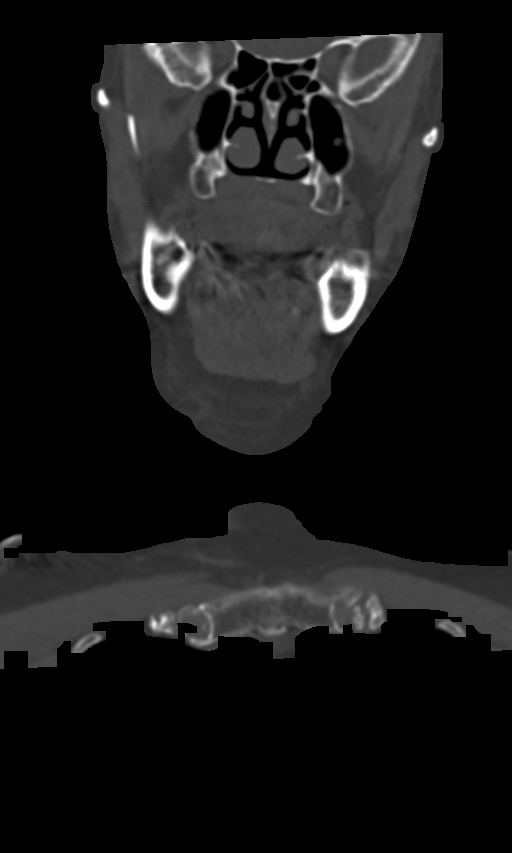
[im 52/111  bone]
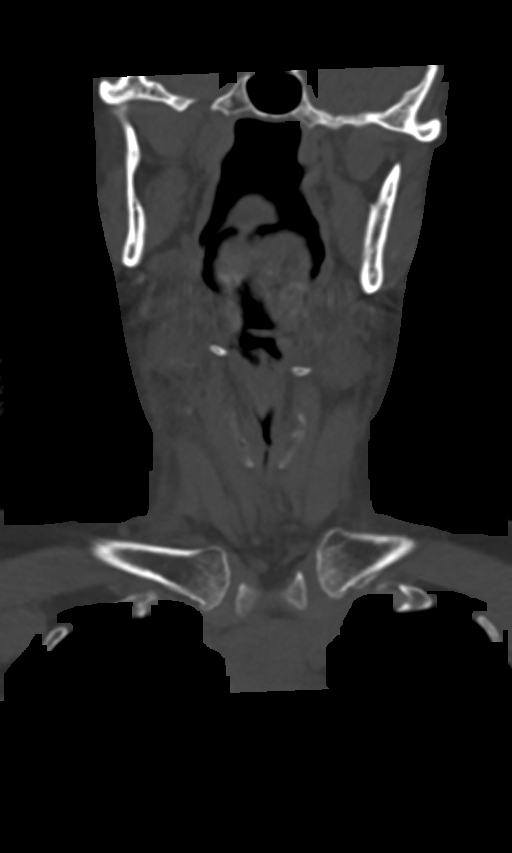
[im 61/111  bone]
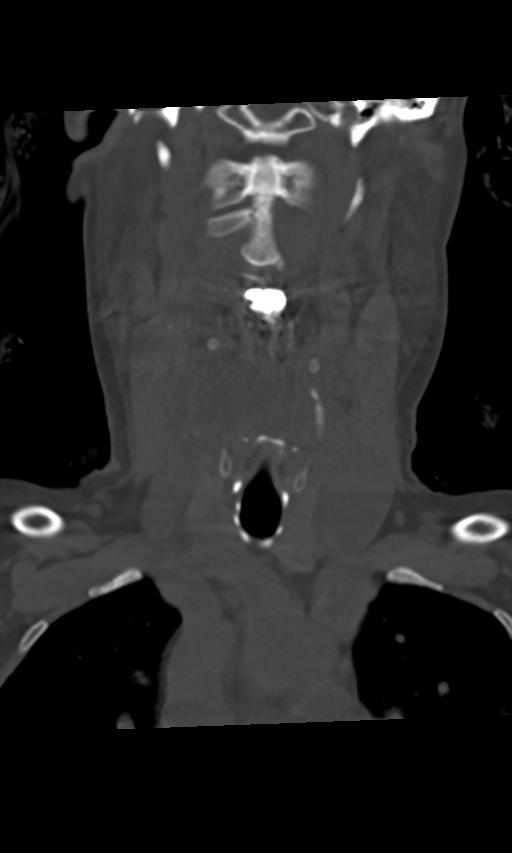

[13 of 33 positions shown; findings below may reference images not displayed]

FINDINGS: There is streak artifact from dental amalgam and spine hardware.

Pharynx and larynx: There is prevertebral soft tissue swelling
spanning approximately C3-C7. This results in mass effect on the
posterior pharyngeal wall. Airway remains patent.

Salivary glands: Unremarkable.

Thyroid: Mildly heterogeneous.

Lymph nodes: No enlarged nodes.

Vascular: Mild calcified plaque at the common carotid bifurcations.

Limited intracranial: Minimally included.

Visualized orbits: Unremarkable.

Mastoids and visualized paranasal sinuses: No significant
opacification.

Skeleton: Anterior fusion at C3-C5 with plate screw fixation and
interbody grafts. There is no evidence of hardware complication.
Canal is poorly imaged due to artifact.

Upper chest: Unremarkable.

Other: Postoperative changes in the ventral neck along approach for
ACDF.
IMPRESSION: Postoperative changes of recent C3-C5 ACDF. No evidence of hardware
complication. There is persistent postoperative prevertebral soft
tissue swelling with mass effect on the posterior pharyngeal wall
but airway remains patent.

## 2021-02-09 MED ORDER — MORPHINE SULFATE (PF) 4 MG/ML IV SOLN
4.0000 mg | Freq: Once | INTRAVENOUS | Status: AC
  Administered 2021-02-09: 4 mg via INTRAVENOUS
  Filled 2021-02-09: qty 1

## 2021-02-09 MED ORDER — ONDANSETRON HCL 4 MG/2ML IJ SOLN
4.0000 mg | Freq: Once | INTRAMUSCULAR | Status: AC
  Administered 2021-02-09: 4 mg via INTRAVENOUS
  Filled 2021-02-09: qty 2

## 2021-02-09 MED ORDER — POTASSIUM CHLORIDE CRYS ER 20 MEQ PO TBCR
40.0000 meq | EXTENDED_RELEASE_TABLET | Freq: Once | ORAL | Status: AC
  Administered 2021-02-09: 40 meq via ORAL
  Filled 2021-02-09: qty 2

## 2021-02-09 MED ORDER — SODIUM CHLORIDE 0.9 % IV BOLUS
1000.0000 mL | Freq: Once | INTRAVENOUS | Status: AC
  Administered 2021-02-09: 1000 mL via INTRAVENOUS

## 2021-02-09 NOTE — Telephone Encounter (Signed)
Received TC from patient who reports she had neck surgery with [facility] on 02/02/21.  She c/o dizziness and states she has passed out twice since surgery.  States she passed out on Saturday, fell, reports no injuries.  States she passed out again this morning and her daughter broke her fall, she is still c/o being very dizzy.  RN advised patient to call [facility], pt states she "called him and they told her to call her PCP".  RN advised patient to present to ED for evaluation as she will more than likely need imaging, etc  Informed patient this should be evaluated emergently, pt verbalized understanding and states her daughter will take her. SChaplin, RN,BSN

## 2021-02-09 NOTE — ED Provider Notes (Signed)
6:32 PM Pt seen in conjunction with [place] PA-C.   Pt with decreased oral intake over the past week since cervical fusion.  She has had weakness and couple of near syncopal episodes over the past couple of days.  Likely related to poor oral intake and dehydration.  Lab work is overall reassuring.  EKG with known LBBB, no prolonged QT, WPW, Brugada syndrome, heart block or other concerning abnormalities for arrhythmia.  Potassium repleted.  Imaging of the neck does not demonstrate any unexpected findings.  Patient is able to swallow.  No airway compromise.  She will continue pain control, follow-up with neurosurgery.   BP 137/75   Pulse 72   Temp 97.9 F (36.6 C)   Resp 15   LMP 10/07/1990   SpO2 98%    EKG Interpretation  Date/Time:  Tuesday February 09 2021 11:33:47 EDT Ventricular Rate:  109 PR Interval:  138 QRS Duration: 128 QT Interval:  360 QTC Calculation: 484 R Axis:   -8 Text Interpretation: Sinus tachycardia Left bundle branch block Non-specific ST-t changes Confirmed by Lajean Saver [phone]) on 02/09/2021 4:05:15 PM          Carlisle Cater, PA-C 02/09/21 1834    Lajean Saver, MD 02/11/21 1312

## 2021-02-09 NOTE — ED Triage Notes (Signed)
Pt called EMS for syncope x 2 over the last two days. Reports feeling dizzy prior to passing out. Sunday she leaned up against the wall and slid down, no injury. Yesterday did fall in the kitchen. Today had episode of dizziness and her body shaking but did not pass out. C/o neck pain (had neck surgery last week) and thinks it is just from not having her pain medication this morning.

## 2021-02-09 NOTE — ED Provider Notes (Signed)
Emergency Medicine Provider Triage Evaluation Note  Katherine Haynes , a 70 y.o. female  was evaluated in triage.  Pt complains of syncope, weakness. She had neck surgery 1 week ago. She has had sore throat, difficulty swallowing, decreased oral intake since that time. Two episodes of syncope over the past 2 days.   Review of Systems  Positive: Weak, lightheaded, syncope Negative: fever  Physical Exam  BP 119/77   Pulse (!) 103   Temp 98 F (36.7 C) (Oral)   Resp 14   LMP 10/07/1990   SpO2 98%  Gen:   Awake, no distress   Resp:  Normal effort  MSK:   Moves extremities without difficulty, healing surgical wound R anterior neck Other:    Medical Decision Making  Medically screening exam initiated at 11:50 AM.  Appropriate orders placed.  Katherine Haynes was informed that the remainder of the evaluation will be completed by another provider, this initial triage assessment does not replace that evaluation, and the importance of remaining in the ED until their evaluation is complete.     Carlisle Cater, PA-C 02/09/21 1151    Lajean Saver, MD 02/11/21 [phone]

## 2021-02-09 NOTE — Discharge Instructions (Addendum)
Continue taking your pain medicine to assist in your difficulty swallowing. Broth, applesauce and protein shakes will help maintain adequate nourishment. Symptoms should improve with time. Please return if symptoms worsen or you develop fever, chills or sweats.   Follow-up with neurosurgery as instructed.

## 2021-02-09 NOTE — ED Provider Notes (Signed)
[facility]ENC[facility] DEPARTMENT Provider Note   CSN: [id number] Arrival date & time: 02/09/21  1118     History Chief Complaint  Patient presents with   Loss of Consciousness    Katherine Haynes is a 70 y.o. female with a pmh of HLD, HTN and a cervical discotomy performed last week. Today she presents with weakness and two episodes of near-syncope since her surgery. States that both times she began to feel dizzy, noticed her "vision blackening" and felt as though she was going to lose consciousness. Reports that this has not happened in the past. Has been having difficulty swallowing since her surgery, and this has limited her oral intake. Reports 1 bottle of water a day and occasional chicken broth. Feels as though solid food gets stuck in throat and she has to cough it out. Complaining of 10/10 pain in her neck that radiates into bilateral shoulders.   Loss of Consciousness Associated symptoms: dizziness and headaches   Associated symptoms: no chest pain, no fever, no nausea, no palpitations, no shortness of breath and no weakness       Past Medical History:  Diagnosis Date   Hyperlipidemia    Hypertension    Menopause syndrome     Patient Active Problem List   Diagnosis Date Noted   Cervical myelopathy with cervical radiculopathy ([place]) 11/18/2020   LBBB (left bundle branch block) 10/26/2020   Chest pain with moderate risk for cardiac etiology 10/26/2020   Vasomotor symptoms due to menopause 01/07/2019   Edema of both legs 12/11/2018   Status post cholecystectomy 05/01/2017   Healthcare maintenance 04/25/2017   Seasonal allergies 10/07/2010   Hyperlipidemia 05/14/2007   Essential hypertension 05/25/2006    Past Surgical History:  Procedure Laterality Date   CHOLECYSTECTOMY N/A 04/05/2017   Procedure: LAPAROSCOPIC CHOLECYSTECTOMY;  Surgeon: Kieth Brightly Arta Bruce, MD;  Location: [facility];  Service: General;  Laterality: N/A;   MOUTH SURGERY      TONSILLECTOMY     TRANSTHORACIC ECHOCARDIOGRAM  04/19/2019    EF 66 5%.  Mild LVH.  GR 1 DD.  Normal atrial sizes.  Mild aortic valve sclerosis but no stenosis.  Normal IVC.  Otherwise normal valves.   TUBAL LIGATION       OB History     Gravida  3   Para      Term      Preterm      AB      Living  3      SAB      IAB      Ectopic      Multiple      Live Births  3           Family History  Problem Relation Age of Onset   Heart disease Mother        "enlarged heart"   Hypertension Mother    Diabetes Mother    Heart disease Maternal Uncle        Bypass surg   Heart attack Maternal Grandmother        also had irregular heart beat requriring a pacemaker.   Hypertension Other        In multiple relatives.    Hypertension Paternal Grandmother    COPD Sister    Sarcoidosis Sister    Heart block Sister        s/p PPM   Hypertension Brother    Hypertension Sister    Arthritis Sister  COPD Sister    Diabetes Sister    Hypertension Sister    Arthritis Sister    Hypertension Brother    Healthy Brother    Healthy Brother    Healthy Sister    Breast cancer Neg Hx     Social History   Tobacco Use   Smoking status: Former   Smokeless tobacco: Never   Tobacco comments:     1 PPD for 2-3 years. Quit [id number].  Vaping Use   Vaping Use: Never used  Substance Use Topics   Alcohol use: No   Drug use: No    Home Medications Prior to Admission medications   Medication Sig Start Date End Date Taking? Authorizing Provider  amLODipine (NORVASC) 10 MG tablet TAKE 1 TABLET(10 MG) BY MOUTH DAILY Patient taking differently: Take 10 mg by mouth daily. 06/10/20  Yes Velna Ochs, MD  atorvastatin (LIPITOR) 40 MG tablet Take 1 tablet (40 mg total) by mouth daily. 12/23/20  Yes Velna Ochs, MD  cyclobenzaprine (FLEXERIL) 10 MG tablet Take 10 mg by mouth 2 (two) times daily as needed for muscle spasms. 02/02/21  Yes [provider]  furosemide  (LASIX) 20 MG tablet Take 3 tablets (60 mg total) by mouth daily. 12/23/20 12/23/21 Yes Velna Ochs, MD  gabapentin (NEURONTIN) 300 MG capsule Please take 1 capsule at night x 1 week, then increase to 2 capsules at night x 2 weeks, then 3 capsules at night Patient taking differently: Take 900 mg by mouth at bedtime. 12/16/20  Yes Velna Ochs, MD  metoprolol tartrate (LOPRESSOR) 25 MG tablet Take 25 mg by mouth 2 (two) times daily. 11/19/20  Yes [provider]  naproxen (NAPROSYN) 500 MG tablet Take 1 tablet (500 mg total) by mouth 2 (two) times daily with a meal. Patient taking differently: Take 500 mg by mouth 2 (two) times daily as needed for mild pain or moderate pain. 12/30/20 12/30/21 Yes Velna Ochs, MD  oxyCODONE-acetaminophen (PERCOCET) 10-325 MG tablet Take 1 tablet by mouth every 6 (six) hours as needed for pain. 02/02/21  Yes [provider]  potassium chloride (KLOR-CON) 10 MEQ tablet Take 1 tablet (10 mEq total) by mouth daily. 11/24/20  Yes Barrett, Evelene Croon, PA-C    Allergies    Contrast media [iodinated diagnostic agents], Penicillins, and Sulfonamide derivatives  Review of Systems   Review of Systems  Constitutional:  Negative for chills, fatigue and fever.  HENT:  Positive for sore throat, trouble swallowing and voice change.   Eyes:  Negative for photophobia and visual disturbance.  Respiratory:  Negative for chest tightness and shortness of breath.   Cardiovascular:  Positive for syncope. Negative for chest pain, palpitations and leg swelling.  Gastrointestinal:  Negative for abdominal pain, constipation, diarrhea and nausea.  Musculoskeletal:  Positive for myalgias, neck pain and neck stiffness.  Skin: Negative.  Negative for color change and pallor.  Neurological:  Positive for dizziness, syncope and headaches. Negative for weakness and numbness.  Psychiatric/Behavioral: Negative.     Physical Exam Updated Vital Signs BP 137/75   Pulse 72    Temp 97.9 F (36.6 C)   Resp 15   LMP 10/07/1990   SpO2 98%   Physical Exam Constitutional:      Appearance: Normal appearance.  HENT:     Head: Normocephalic and atraumatic.     Right Ear: Tympanic membrane normal.     Left Ear: Tympanic membrane normal.     Mouth/Throat:     Mouth: Mucous  membranes are dry.  Eyes:     Extraocular Movements: Extraocular movements intact.     Pupils: Pupils are equal, round, and reactive to light.  Neck:     Comments: Difficulty rotating neck left and right. Well appearing surgical site on anterior R neck Cardiovascular:     Rate and Rhythm: Tachycardia present.     Pulses: Normal pulses.     Heart sounds: No murmur heard.   No friction rub.  Pulmonary:     Effort: No respiratory distress.     Breath sounds: Normal breath sounds. No wheezing or rhonchi.  Abdominal:     General: Bowel sounds are normal.  Musculoskeletal:        General: No swelling or deformity.     Cervical back: Tenderness (with decreased ROM due to pain) present.  Skin:    General: Skin is warm and dry.     Findings: No bruising.  Neurological:     General: No focal deficit present.     Mental Status: She is alert and oriented to person, place, and time.     Cranial Nerves: No cranial nerve deficit.     Sensory: No sensory deficit.     Motor: No weakness.    ED Results / Procedures / Treatments   Labs (all labs ordered are listed, but only abnormal results are displayed) Labs Reviewed  COMPREHENSIVE METABOLIC PANEL - Abnormal; Notable for the following components:      Result Value   Sodium 134 (*)    Potassium 3.1 (*)    Chloride 93 (*)    Glucose, Bld 113 (*)    AST 52 (*)    All other components within normal limits  CBC WITH DIFFERENTIAL/PLATELET    EKG EKG Interpretation  Date/Time:  Tuesday February 09 2021 11:33:47 EDT Ventricular Rate:  109 PR Interval:  138 QRS Duration: 128 QT Interval:  360 QTC Calculation: 484 R Axis:   -8 Text  Interpretation: Sinus tachycardia Left bundle branch block Non-specific ST-t changes Confirmed by Lajean Saver [phone]) on 02/09/2021 4:05:15 PM  Radiology CT Soft Tissue Neck Wo Contrast  Result Date: 02/09/2021 CLINICAL DATA:  Postoperative pain, recent cervical fusion EXAM: CT NECK WITHOUT CONTRAST TECHNIQUE: Multidetector CT imaging of the neck was performed following the standard protocol without intravenous contrast. COMPARISON:  Correlation made with prior MRI cervical spine 01/12/2021 FINDINGS: There is streak artifact from dental amalgam and spine hardware. Pharynx and larynx: There is prevertebral soft tissue swelling spanning approximately C3-C7. This results in mass effect on the posterior pharyngeal wall. Airway remains patent. Salivary glands: Unremarkable. Thyroid: Mildly heterogeneous. Lymph nodes: No enlarged nodes. Vascular: Mild calcified plaque at the common carotid bifurcations. Limited intracranial: Minimally included. Visualized orbits: Unremarkable. Mastoids and visualized paranasal sinuses: No significant opacification. Skeleton: Anterior fusion at C3-C5 with plate screw fixation and interbody grafts. There is no evidence of hardware complication. Canal is poorly imaged due to artifact. Upper chest: Unremarkable. Other: Postoperative changes in the ventral neck along approach for ACDF. IMPRESSION: Postoperative changes of recent C3-C5 ACDF. No evidence of hardware complication. There is persistent postoperative prevertebral soft tissue swelling with mass effect on the posterior pharyngeal wall but airway remains patent. Electronically Signed   By: Macy Mis M.D.   On: 02/09/2021 17:31     Medications Ordered in ED Medications  sodium chloride 0.9 % bolus 1,000 mL (0 mLs Intravenous Stopped 02/09/21 1721)  morphine 4 MG/ML injection 4 mg (4 mg Intravenous Given 02/09/21 1548)  ondansetron (ZOFRAN) injection 4 mg (4 mg Intravenous Given 02/09/21 1544)  potassium chloride SA  (KLOR-CON) CR tablet 40 mEq (40 mEq Oral Given 02/09/21 1811)  morphine 4 MG/ML injection 4 mg (4 mg Intravenous Given 02/09/21 1812)    ED Course  I have reviewed the triage vital signs and the nursing notes.  Pertinent labs & imaging results that were available during my care of the patient were reviewed by me and considered in my medical decision making (see chart for details).  Patient given 1L NS for hydration. Pain managed with '4mg'$  morphine.   Patient able to swallow water. Reports it to be painful. Better with straw, but still painful.  Given one more '4mg'$  morphine dose. Patient able to stand and ambulate. Pt agreeable to discharge, husband will transport.    MDM Rules/Calculators/A&P                         Patient CT shows persistent prevertebral soft tissue swelling, without airway obstruction. Patient able to swallow water in ED. Reports a significant decrease in pain. Na, Cl and K decreased, likely due to poor oral intake since her surgery.   Patient well-appearing and able to swallow. Denies any SOB. Expresses understanding of the importance of remaining hydrated. Instructed to continue pain medications to relieve associated pain. Patient stood up and took some steps around the room with me without dizziness, light headedness or syncope. She would like to go home.  Instructed to return if sx worsen or she develops fever, chills.  Final Clinical Impression(s) / ED Diagnoses Final diagnoses:  Near syncope  Dehydration    Rx / DC Orders ED Discharge Orders     None        Zayden Hahne, Cecilio Asper, PA-C 02/09/21 1853    Lajean Saver, MD 02/11/21 1312

## 2021-02-11 NOTE — Telephone Encounter (Signed)
Just spoke with patient.  Appointment scheduled for next Wednesday 02/17/2021 at 11:15 am.

## 2021-02-17 ENCOUNTER — Encounter: Payer: Self-pay | Admitting: Internal Medicine

## 2021-02-17 ENCOUNTER — Ambulatory Visit (INDEPENDENT_AMBULATORY_CARE_PROVIDER_SITE_OTHER): Payer: Medicare Other | Admitting: Internal Medicine

## 2021-02-17 ENCOUNTER — Other Ambulatory Visit: Payer: Self-pay

## 2021-02-17 DIAGNOSIS — R600 Localized edema: Secondary | ICD-10-CM | POA: Diagnosis not present

## 2021-02-17 DIAGNOSIS — G959 Disease of spinal cord, unspecified: Secondary | ICD-10-CM | POA: Diagnosis not present

## 2021-02-17 DIAGNOSIS — M5412 Radiculopathy, cervical region: Secondary | ICD-10-CM | POA: Diagnosis not present

## 2021-02-17 NOTE — Progress Notes (Signed)
ROI form signed /faxed to [facility] for records.

## 2021-02-19 ENCOUNTER — Encounter: Payer: Self-pay | Admitting: Internal Medicine

## 2021-02-19 NOTE — Assessment & Plan Note (Addendum)
Patient is here for ED and postop follow-up.  I referred patient to neurosurgery at her last visit due to cervical neck pain with radiculopathy and right hand weakness, found to have multilevel degenerative changes with canal stenosis at C3-see 4 with evidence of cord flattening on MRI.  She is now status post anterior cervical discectomy and fusion of C3-C5 on 02/02/2021.  Her postop course was complicated by dizziness and near syncope.  She presented to the ED on 02/09/2021. CT neck showed persistent postoperative prevertebral soft tissue swelling with mass effect on the posterior pharyngeal wall but airway was patent. She was given IVFs and discharged from the ED. Since then, she denies further episodes of dizziness.   She still has some solid food dysphagia but reports symptoms are improving. Orthostatic vitals checked today are mildly positive:  BP 128/68 125/77 106/68  Position Lying Sitting Standing  Pulse 100 106 117   Her right hand weakness has improved after surgery and she reports her pain is controlled on PRN flexeril. I have encouraged her to increase her oral fluid intake to 2L per day. Hold lasix for the next 3 days. Follow up already scheduled with me in December.

## 2021-02-19 NOTE — Assessment & Plan Note (Addendum)
Patient has bilateral lower extremity edema that I suspect is lymphedema. Other work up has been unremakrable including venous duplex studies to look for venous insufficieny. She was started on lasix empirically over a year ago which she feels helps some. But we are holding this for now in the setting of orthostasis, dysphagia, and poor po intake following ACDF of her cervical spine. Found to have post operative prevertebral soft tissue swelling with mass effect on the posterior pharyngeal wall. Symptoms are improving. Encouraged patient to increase her fluid intake, hold lasix for at least three days. Consider stopping lasix altogether at follow up.

## 2021-02-19 NOTE — Progress Notes (Signed)
Subjective:   Patient ID: Katherine Haynes female   DOB: 1950/12/16 70 y.o.   MRN: [id number]  HPI: [name] is a 70 y.o. female with past medical history outlined below here for post op follow up after ACDF of her cervical spine at C3-C5. For the details of today's visit, please refer to the assessment and plan.   Past Medical History:  Diagnosis Date   Hyperlipidemia    Hypertension    Menopause syndrome    Current Outpatient Medications  Medication Sig Dispense Refill   amLODipine (NORVASC) 10 MG tablet TAKE 1 TABLET(10 MG) BY MOUTH DAILY (Patient taking differently: Take 10 mg by mouth daily.) 90 tablet 3   atorvastatin (LIPITOR) 40 MG tablet Take 1 tablet (40 mg total) by mouth daily. 90 tablet 3   cyclobenzaprine (FLEXERIL) 10 MG tablet Take 10 mg by mouth 2 (two) times daily as needed for muscle spasms.     furosemide (LASIX) 20 MG tablet Take 3 tablets (60 mg total) by mouth daily. 90 tablet 11   gabapentin (NEURONTIN) 300 MG capsule Please take 1 capsule at night x 1 week, then increase to 2 capsules at night x 2 weeks, then 3 capsules at night (Patient taking differently: Take 900 mg by mouth at bedtime.) 63 capsule 0   metoprolol tartrate (LOPRESSOR) 25 MG tablet Take 25 mg by mouth 2 (two) times daily.     naproxen (NAPROSYN) 500 MG tablet Take 1 tablet (500 mg total) by mouth 2 (two) times daily with a meal. (Patient taking differently: Take 500 mg by mouth 2 (two) times daily as needed for mild pain or moderate pain.) 60 tablet 2   oxyCODONE-acetaminophen (PERCOCET) 10-325 MG tablet Take 1 tablet by mouth every 6 (six) hours as needed for pain.     potassium chloride (KLOR-CON) 10 MEQ tablet Take 1 tablet (10 mEq total) by mouth daily. 30 tablet 11   No current facility-administered medications for this visit.   Family History  Problem Relation Age of Onset   Heart disease Mother        "enlarged heart"   Hypertension Mother    Diabetes Mother    Heart  disease Maternal Uncle        Bypass surg   Heart attack Maternal Grandmother        also had irregular heart beat requriring a pacemaker.   Hypertension Other        In multiple relatives.    Hypertension Paternal Grandmother    COPD Sister    Sarcoidosis Sister    Heart block Sister        s/p PPM   Hypertension Brother    Hypertension Sister    Arthritis Sister    COPD Sister    Diabetes Sister    Hypertension Sister    Arthritis Sister    Hypertension Brother    Healthy Brother    Healthy Brother    Healthy Sister    Breast cancer Neg Hx    Social History   Socioeconomic History   Marital status: Married    Spouse name: Not on file   Number of children: Not on file   Years of education: Not on file   Highest education level: Not on file  Occupational History   Not on file  Tobacco Use   Smoking status: Former   Smokeless tobacco: Never   Tobacco comments:     1 PPD for 2-3 years. Quit ~7782.  Vaping  Use   Vaping Use: Never used  Substance and Sexual Activity   Alcohol use: No   Drug use: No   Sexual activity: Yes    Birth control/protection: Surgical  Other Topics Concern   Not on file  Social History Narrative   Lives with husband. Good relationship, feels safe at home. Studying medical administration.       Current Social History 09/27/2018        Patient lives with spouse in an apartment on the second floor. There are 14 steps with handrails up to the entrance the patient uses.       Patient's method of transportation is personal car.      The highest level of education was high school diploma.      The patient currently is employed as a Engineer, site in patient's homes.      Identified important Relationships are "My husband, my kids, my grands and great grands."       Pets : None       Interests / Fun: Watching TV, going to beach and park, cooking out with family.       Current Stressors: "finances, teenagers" (grandkids)       Religious /  Personal Beliefs: [employer], "I was raised to treat people the way you want to be treated.          Social Determinants of Health   Financial Resource Strain: Not on file  Food Insecurity: Not on file  Transportation Needs: Not on file  Physical Activity: Not on file  Stress: Not on file  Social Connections: Not on file    Review of Systems: Review of Systems  Gastrointestinal:        Solid food dysphagia, improving  Neurological:  Positive for dizziness. Negative for loss of consciousness.    Objective:  Physical Exam:  Vitals:   02/17/21 1054 02/17/21 1104 02/17/21 1105 02/17/21 1109  BP:  128/68 125/77 106/68  Pulse:  100 (!) 106 (!) 117  Temp: 97.7 F (36.5 C)     TempSrc: Oral     SpO2: 100%     Weight: 147 lb 9.6 oz (67 kg)     Height: 5\' 6"  (1.676 m)       Physical Exam Constitutional:      Appearance: Normal appearance.  Cardiovascular:     Rate and Rhythm: Normal rate and regular rhythm.  Pulmonary:     Effort: Pulmonary effort is normal.     Breath sounds: Normal breath sounds.  Musculoskeletal:     Right lower leg: Edema present.     Left lower leg: Edema present.  Skin:    General: Skin is warm and dry.  Neurological:     General: No focal deficit present.     Mental Status: She is alert and oriented to person, place, and time.  Psychiatric:        Mood and Affect: Mood normal.        Behavior: Behavior normal.     Assessment & Plan:   See Encounters Tab for problem based charting.

## 2021-02-22 DIAGNOSIS — M5000 Cervical disc disorder with myelopathy, unspecified cervical region: Secondary | ICD-10-CM | POA: Diagnosis not present

## 2021-04-13 ENCOUNTER — Other Ambulatory Visit: Payer: Self-pay | Admitting: Internal Medicine

## 2021-04-13 DIAGNOSIS — M542 Cervicalgia: Secondary | ICD-10-CM

## 2021-04-13 NOTE — Telephone Encounter (Signed)
Next appt scheduled 07/07/21 with PCP.

## 2021-04-26 ENCOUNTER — Ambulatory Visit (INDEPENDENT_AMBULATORY_CARE_PROVIDER_SITE_OTHER): Payer: Medicare Other | Admitting: *Deleted

## 2021-04-26 DIAGNOSIS — Z23 Encounter for immunization: Secondary | ICD-10-CM | POA: Diagnosis not present

## 2021-05-18 ENCOUNTER — Other Ambulatory Visit: Payer: Self-pay | Admitting: Internal Medicine

## 2021-05-18 NOTE — Telephone Encounter (Signed)
Next appt scheduled 07/07/21 with PCP.

## 2021-05-19 NOTE — Telephone Encounter (Signed)
Thank you :)

## 2021-05-19 NOTE — Telephone Encounter (Signed)
Called pt who stated she's no longer taking Metoprolol.

## 2021-05-19 NOTE — Telephone Encounter (Signed)
I thought we had discontinued this medication. Cardiology had stopped it in April due to conduction abnormalities on her EKG. She was also have symptoms of dizziness. Can you clarify if she is still taking it?

## 2021-05-24 ENCOUNTER — Ambulatory Visit: Payer: Medicare Other | Admitting: Cardiology

## 2021-05-28 ENCOUNTER — Other Ambulatory Visit: Payer: Self-pay | Admitting: Internal Medicine

## 2021-05-28 DIAGNOSIS — N951 Menopausal and female climacteric states: Secondary | ICD-10-CM

## 2021-06-18 NOTE — Progress Notes (Deleted)
Cardiology Office Note:    Date:  06/18/2021   ID:  Katherine Haynes, DOB 1951/01/23, MRN [id number]  PCP:  Reymundo Poll, MD  Cardiologist:  Bryan Lemma, MD   Referring MD: Reymundo Poll, MD   No chief complaint on file. ***  History of Present Illness:    Katherine Haynes is a 70 y.o. female with a hx of LBBB, nonischemic nuclear stress test 11/2020, HLD, HTN, and chronic leg swelling felt related to venous stasis vs lymphedema. She developed cough on ACEI and orthostatic hypotension on HCTZ. She established cardiac care with Dr. Herbie Baltimore in April 2022, referral for evaluation of LBBB. She had a UC  visit 10/20/20 for 6 days of dizziness, headache, weakness, and numbness in both hands along with left lower chest discomfort. EKG notable for LBBB that was new compared to 2018 tracing. She was sent to the ER. Brain MRI negative for acute findings. CE were negative and EKG in ER no longer showed LBBB with a faster rate. Interesting that the LBBB appears with bradycardia and not with faster rates. Dr. Herbie Baltimore suspected she could be losing synchronized LV function leading to acute decrease in contractility and EF. She continued to feel poorly and presented back to ER 11/23/20 with chest pain occurring with bradycardia. She underwent a treadmill myoview that was negative for perfusion defects at rest or with exertion. She has not been seen since discharge in May 2022. She was seen back in the ER 02/09/21 for near syncope presumed due to reduced PO intake and dehydration.   She presents today for routine follow up.    LBBB - myoview nonischemic   Hypertension - continue amlodipine   Hyperlipidemia  - continue lipitor 40 mg 11/24/2020: Cholesterol 130; HDL 54; LDL Cholesterol 66; Triglycerides 49; VLDL 10   Lower extremity swelling - suspect venous stasis or lymphedema - reassuring echo - continue lasix with potassium supplement       Past Medical History:  Diagnosis Date    Hyperlipidemia    Hypertension    Menopause syndrome     Past Surgical History:  Procedure Laterality Date   CHOLECYSTECTOMY N/A 04/05/2017   Procedure: LAPAROSCOPIC CHOLECYSTECTOMY;  Surgeon: Kinsinger, De Blanch, MD;  Location: [facility];  Service: General;  Laterality: N/A;   MOUTH SURGERY     TONSILLECTOMY     TRANSTHORACIC ECHOCARDIOGRAM  04/19/2019    EF 66 5%.  Mild LVH.  GR 1 DD.  Normal atrial sizes.  Mild aortic valve sclerosis but no stenosis.  Normal IVC.  Otherwise normal valves.   TUBAL LIGATION      Current Medications: No outpatient medications have been marked as taking for the 06/30/21 encounter (Appointment) with Marcelino Duster, PA.     Allergies:   Contrast media [iodinated diagnostic agents], Penicillins, and Sulfonamide derivatives   Social History   Socioeconomic History   Marital status: Married    Spouse name: Not on file   Number of children: Not on file   Years of education: Not on file   Highest education level: Not on file  Occupational History   Not on file  Tobacco Use   Smoking status: Former   Smokeless tobacco: Never   Tobacco comments:     1 PPD for 2-3 years. Quit ~1914.  Vaping Use   Vaping Use: Never used  Substance and Sexual Activity   Alcohol use: No   Drug use: No   Sexual activity: Yes    Birth control/protection:  Surgical  Other Topics Concern   Not on file  Social History Narrative   Lives with husband. Good relationship, feels safe at home. Studying medical administration.       Current Social History 09/27/2018        Patient lives with spouse in an apartment on the second floor. There are 14 steps with handrails up to the entrance the patient uses.       Patient's method of transportation is personal car.      The highest level of education was high school diploma.      The patient currently is employed as a Engineer, site in patient's homes.      Identified important Relationships are "My husband, my kids, my  grands and great grands."       Pets : None       Interests / Fun: Watching TV, going to beach and park, cooking out with family.       Current Stressors: "finances, teenagers" (grandkids)       Religious / Personal Beliefs: Methodist, "I was raised to treat people the way you want to be treated.          Social Determinants of Health   Financial Resource Strain: Not on file  Food Insecurity: Not on file  Transportation Needs: Not on file  Physical Activity: Not on file  Stress: Not on file  Social Connections: Not on file     Family History: The patient's ***family history includes Arthritis in her sister and sister; COPD in her sister and sister; Diabetes in her mother and sister; Healthy in her brother, brother, and sister; Heart attack in her maternal grandmother; Heart block in her sister; Heart disease in her maternal uncle and mother; Hypertension in her brother, brother, mother, paternal grandmother, sister, sister, and another family member; Sarcoidosis in her sister. There is no history of Breast cancer.  ROS:   Please see the history of present illness.    *** All other systems reviewed and are negative.  EKGs/Labs/Other Studies Reviewed:    The following studies were reviewed today:  MYOVIEW: Preliminary report: EF normal w/ no perfusion defects at rest or with exertion. QRS 130 ms at baseline, widens with increased HR.   ECHO: 11/23/2020  1. Left ventricular ejection fraction, by estimation, is 60 to 65%. The left ventricle has normal function. Left ventricular endocardial border not optimally defined to evaluate regional wall motion. Left ventricular diastolic parameters are indeterminate.   2. Right ventricular systolic function is normal. The right ventricular  size is normal.   3. Trivial mitral valve regurgitation.   4. Aortic valve regurgitation is not visualized. Mild aortic valve  sclerosis is present, with no evidence of aortic valve stenosis.   EKG:   EKG is *** ordered today.  The ekg ordered today demonstrates ***  Recent Labs: 11/23/2020: B Natriuretic Peptide 16.2; Magnesium 2.2; TSH 1.520 02/09/2021: ALT 36; BUN 15; Creatinine, Ser 0.93; Hemoglobin 14.2; Platelets 342; Potassium 3.1; Sodium 134  Recent Lipid Panel    Component Value Date/Time   CHOL 130 11/24/2020 0347   CHOL 116 03/20/2019 1057   TRIG 49 11/24/2020 0347   HDL 54 11/24/2020 0347   HDL 49 03/20/2019 1057   CHOLHDL 2.4 11/24/2020 0347   VLDL 10 11/24/2020 0347   LDLCALC 66 11/24/2020 0347   LDLCALC 46 03/20/2019 1057    Physical Exam:    VS:  LMP 10/07/1990     Wt Readings from Last  3 Encounters:  02/17/21 147 lb 9.6 oz (67 kg)  01/13/21 152 lb 14.4 oz (69.4 kg)  12/16/20 148 lb 1.6 oz (67.2 kg)     GEN: *** Well nourished, well developed in no acute distress HEENT: Normal NECK: No JVD; No carotid bruits LYMPHATICS: No lymphadenopathy CARDIAC: ***RRR, no murmurs, rubs, gallops RESPIRATORY:  Clear to auscultation without rales, wheezing or rhonchi  ABDOMEN: Soft, non-tender, non-distended MUSCULOSKELETAL:  No edema; No deformity  SKIN: Warm and dry NEUROLOGIC:  Alert and oriented x 3 PSYCHIATRIC:  Normal affect   ASSESSMENT:    No diagnosis found. PLAN:    In order of problems listed above:  No diagnosis found.   Medication Adjustments/Labs and Tests Ordered: Current medicines are reviewed at length with the patient today.  Concerns regarding medicines are outlined above.  No orders of the defined types were placed in this encounter.  No orders of the defined types were placed in this encounter.   Signed, Marcelino Duster, [place]  06/18/2021 2:41 PM    [facility]

## 2021-06-23 ENCOUNTER — Ambulatory Visit (INDEPENDENT_AMBULATORY_CARE_PROVIDER_SITE_OTHER): Payer: Medicare Other | Admitting: Internal Medicine

## 2021-06-23 ENCOUNTER — Encounter: Payer: Self-pay | Admitting: Internal Medicine

## 2021-06-23 ENCOUNTER — Other Ambulatory Visit: Payer: Self-pay

## 2021-06-23 VITALS — BP 129/66 | HR 99 | Temp 98.0°F | Ht 66.0 in | Wt 143.9 lb

## 2021-06-23 DIAGNOSIS — I447 Left bundle-branch block, unspecified: Secondary | ICD-10-CM

## 2021-06-23 DIAGNOSIS — N951 Menopausal and female climacteric states: Secondary | ICD-10-CM

## 2021-06-23 DIAGNOSIS — I10 Essential (primary) hypertension: Secondary | ICD-10-CM

## 2021-06-23 DIAGNOSIS — I890 Lymphedema, not elsewhere classified: Secondary | ICD-10-CM | POA: Diagnosis not present

## 2021-06-23 DIAGNOSIS — R600 Localized edema: Secondary | ICD-10-CM | POA: Diagnosis not present

## 2021-06-23 DIAGNOSIS — Z23 Encounter for immunization: Secondary | ICD-10-CM | POA: Insufficient documentation

## 2021-06-23 MED ORDER — ZOSTER VAC RECOMB ADJUVANTED 50 MCG/0.5ML IM SUSR: 0.5000 mL | Freq: Once | INTRAMUSCULAR | 0 refills | Status: AC

## 2021-06-23 MED ORDER — FUROSEMIDE 20 MG PO TABS: 60.0000 mg | ORAL_TABLET | Freq: Every day | ORAL | 0 refills | Status: DC

## 2021-06-23 MED ORDER — GABAPENTIN 600 MG PO TABS: 600.0000 mg | ORAL_TABLET | Freq: Every evening | ORAL | 1 refills | Status: DC | PRN

## 2021-06-23 MED ORDER — ZOSTER VAC RECOMB ADJUVANTED 50 MCG/0.5ML IM SUSR: 0.5000 mL | Freq: Once | INTRAMUSCULAR | 0 refills | Status: DC

## 2021-06-23 NOTE — Progress Notes (Signed)
Subjective:   Patient ID: Katherine Haynes female   DOB: 03-18-1951 70 y.o.   MRN: [id number]  HPI: [name] is a 70 y.o. female with past medical history outlined below here for follow up of her lower extremity swelling and other chronic medical problems. For the details of today's visit, please refer to the assessment and plan.   Past Medical History:  Diagnosis Date   Hyperlipidemia    Hypertension    Menopause syndrome    Current Outpatient Medications  Medication Sig Dispense Refill   amLODipine (NORVASC) 10 MG tablet TAKE 1 TABLET(10 MG) BY MOUTH DAILY (Patient taking differently: Take 10 mg by mouth daily.) 90 tablet 3   atorvastatin (LIPITOR) 40 MG tablet Take 1 tablet (40 mg total) by mouth daily. 90 tablet 3   cyclobenzaprine (FLEXERIL) 10 MG tablet Take 10 mg by mouth 2 (two) times daily as needed for muscle spasms.     furosemide (LASIX) 20 MG tablet Take 3 tablets (60 mg total) by mouth daily. 90 tablet 11   gabapentin (NEURONTIN) 300 MG capsule TAKE 1 CAPSULE(300 MG) BY MOUTH AT BEDTIME 90 capsule 1   metoprolol tartrate (LOPRESSOR) 25 MG tablet Take 25 mg by mouth 2 (two) times daily.     naproxen (NAPROSYN) 500 MG tablet TAKE 1 TABLET(500 MG) BY MOUTH TWICE DAILY WITH A MEAL 60 tablet 2   oxyCODONE-acetaminophen (PERCOCET) 10-325 MG tablet Take 1 tablet by mouth every 6 (six) hours as needed for pain.     potassium chloride (KLOR-CON) 10 MEQ tablet Take 1 tablet (10 mEq total) by mouth daily. 30 tablet 11   No current facility-administered medications for this visit.   Family History  Problem Relation Age of Onset   Heart disease Mother        "enlarged heart"   Hypertension Mother    Diabetes Mother    Heart disease Maternal Uncle        Bypass surg   Heart attack Maternal Grandmother        also had irregular heart beat requriring a pacemaker.   Hypertension Other        In multiple relatives.    Hypertension Paternal Grandmother    COPD Sister     Sarcoidosis Sister    Heart block Sister        s/p PPM   Hypertension Brother    Hypertension Sister    Arthritis Sister    COPD Sister    Diabetes Sister    Hypertension Sister    Arthritis Sister    Hypertension Brother    Healthy Brother    Healthy Brother    Healthy Sister    Breast cancer Neg Hx    Social History   Socioeconomic History   Marital status: Married    Spouse name: Not on file   Number of children: Not on file   Years of education: Not on file   Highest education level: Not on file  Occupational History   Not on file  Tobacco Use   Smoking status: Former   Smokeless tobacco: Never   Tobacco comments:     1 PPD for 2-3 years. Quit ~6962.  Vaping Use   Vaping Use: Never used  Substance and Sexual Activity   Alcohol use: No   Drug use: No   Sexual activity: Yes    Birth control/protection: Surgical  Other Topics Concern   Not on file  Social History Narrative   Lives with  husband. Good relationship, feels safe at home. Studying medical administration.       Current Social History 09/27/2018        Patient lives with spouse in an apartment on the second floor. There are 14 steps with handrails up to the entrance the patient uses.       Patient's method of transportation is personal car.      The highest level of education was high school diploma.      The patient currently is employed as a Engineer, site in patient's homes.      Identified important Relationships are "My husband, my kids, my grands and great grands."       Pets : None       Interests / Fun: Watching TV, going to beach and park, cooking out with family.       Current Stressors: "finances, teenagers" (grandkids)       Religious / Personal Beliefs: Methodist, "I was raised to treat people the way you want to be treated.          Social Determinants of Health   Financial Resource Strain: Not on file  Food Insecurity: Not on file  Transportation Needs: Not on file   Physical Activity: Not on file  Stress: Not on file  Social Connections: Not on file    Review of Systems: Review of Systems  Respiratory:  Negative for cough and shortness of breath.   Cardiovascular:  Negative for chest pain.    Objective:  Physical Exam:  Vitals:   06/23/21 0844  Weight: 143 lb 14.4 oz (65.3 kg)  Height: 5\' 6"  (1.676 m)    Physical Exam Constitutional:      Appearance: Normal appearance. She is normal weight.  Cardiovascular:     Rate and Rhythm: Normal rate and regular rhythm.     Heart sounds: No murmur heard. Pulmonary:     Effort: Pulmonary effort is normal. No respiratory distress.     Breath sounds: Normal breath sounds.  Musculoskeletal:     Right lower leg: Edema present.     Left lower leg: Edema present.  Skin:    General: Skin is warm and dry.  Neurological:     Mental Status: She is alert.  Psychiatric:        Mood and Affect: Mood normal.        Behavior: Behavior normal.     Assessment & Plan:   See Encounters Tab for problem based charting.

## 2021-06-23 NOTE — Assessment & Plan Note (Signed)
Relatively well controlled on gabapentin 300 mg QHS, however has breakthrough symptoms a couple of nights a week. She would like to try a higher dose. Increased to 600 mg QHS, follow up as needed.

## 2021-06-23 NOTE — Assessment & Plan Note (Signed)
Given printed Rx for shringrex, instructed her to take it to her pharmacy. Will provide second Rx at follow up.

## 2021-06-23 NOTE — Assessment & Plan Note (Signed)
Patient has chronic idiopathic lymphedema of her bilateral lower extremities. Previously managed with compression stockings and oral lasix. Her lasix was temporarily dc'd due to post op complications following her C3-4 cervical discectomy and fusion in July. She developed prevertebral soft tissue swelling with mass effect on the posterior pharyngeal wall causing dysphagia, poor po intake, and orthostatic syncope. This has resolved and she no longer has difficulty swallowing. Her lower extremity swelling has progressed since stopping lasix and she would like to restart it. She was previously taking 60 mg a day but developed issues with hypokalemia. I have given her an Rx for 20 mg tablets and instructed her to take 1-3 tablets PRN. Plan to follow up 6 weeks for re evaluation and blood work.

## 2021-06-23 NOTE — Patient Instructions (Addendum)
Ms. Katherine Haynes,  It was a pleasure to see you as always. Please follow up with me again in 6 weeks for your swelling. I will call you with the results of your blood work.   If you have any questions or concerns, call our clinic at [phone] or after hours call [phone] and ask for the internal medicine resident on call.   Thank you!  Dr. Darnell Level

## 2021-06-23 NOTE — Assessment & Plan Note (Signed)
Chronic and well controlled on amlodipine 10 mg daily. Continue.

## 2021-06-24 ENCOUNTER — Other Ambulatory Visit: Payer: Self-pay | Admitting: Internal Medicine

## 2021-06-24 DIAGNOSIS — E876 Hypokalemia: Secondary | ICD-10-CM

## 2021-06-24 LAB — BMP8+ANION GAP
Anion Gap: 14 mmol/L (ref 10.0–18.0)
BUN/Creatinine Ratio: 11 — ABNORMAL LOW (ref 12–28)
BUN: 12 mg/dL (ref 8–27)
CO2: 27 mmol/L (ref 20–29)
Calcium: 9.4 mg/dL (ref 8.7–10.3)
Chloride: 99 mmol/L (ref 96–106)
Creatinine, Ser: 1.14 mg/dL — ABNORMAL HIGH (ref 0.57–1.00)
Glucose: 145 mg/dL — ABNORMAL HIGH (ref 70–99)
Potassium: 3.4 mmol/L — ABNORMAL LOW (ref 3.5–5.2)
Sodium: 140 mmol/L (ref 134–144)
eGFR: 52 mL/min/{1.73_m2} — ABNORMAL LOW (ref >59)

## 2021-06-24 MED ORDER — POTASSIUM CHLORIDE ER 10 MEQ PO TBCR: 10.0000 meq | EXTENDED_RELEASE_TABLET | Freq: Every day | ORAL | 0 refills | Status: DC

## 2021-06-30 ENCOUNTER — Ambulatory Visit: Payer: Medicare Other | Admitting: Physician Assistant

## 2021-06-30 ENCOUNTER — Other Ambulatory Visit: Payer: Self-pay

## 2021-06-30 DIAGNOSIS — R600 Localized edema: Secondary | ICD-10-CM

## 2021-06-30 MED ORDER — FUROSEMIDE 20 MG PO TABS: 60.0000 mg | ORAL_TABLET | Freq: Every day | ORAL | 0 refills | Status: DC

## 2021-07-07 ENCOUNTER — Encounter: Payer: Medicare Other | Admitting: Internal Medicine

## 2021-07-22 ENCOUNTER — Encounter: Payer: Medicare Other | Admitting: Internal Medicine

## 2021-08-04 ENCOUNTER — Other Ambulatory Visit: Payer: Self-pay

## 2021-08-04 ENCOUNTER — Encounter: Payer: Self-pay | Admitting: Internal Medicine

## 2021-08-04 ENCOUNTER — Ambulatory Visit (INDEPENDENT_AMBULATORY_CARE_PROVIDER_SITE_OTHER): Payer: Medicare Other | Admitting: Internal Medicine

## 2021-08-04 ENCOUNTER — Ambulatory Visit (HOSPITAL_COMMUNITY)
Admission: RE | Admit: 2021-08-04 | Discharge: 2021-08-04 | Disposition: A | Discharge status: Home / Self Care | Payer: Medicare Other | Source: Ambulatory Visit | Attending: Internal Medicine | Admitting: Internal Medicine

## 2021-08-04 VITALS — BP 120/60 | HR 83 | Temp 97.8°F | Ht 66.0 in | Wt 141.5 lb

## 2021-08-04 DIAGNOSIS — I10 Essential (primary) hypertension: Secondary | ICD-10-CM | POA: Diagnosis not present

## 2021-08-04 DIAGNOSIS — E782 Mixed hyperlipidemia: Secondary | ICD-10-CM | POA: Diagnosis not present

## 2021-08-04 DIAGNOSIS — N951 Menopausal and female climacteric states: Secondary | ICD-10-CM

## 2021-08-04 DIAGNOSIS — K5900 Constipation, unspecified: Secondary | ICD-10-CM | POA: Diagnosis not present

## 2021-08-04 DIAGNOSIS — I890 Lymphedema, not elsewhere classified: Secondary | ICD-10-CM

## 2021-08-04 DIAGNOSIS — R1030 Lower abdominal pain, unspecified: Secondary | ICD-10-CM | POA: Diagnosis not present

## 2021-08-04 DIAGNOSIS — R1031 Right lower quadrant pain: Secondary | ICD-10-CM | POA: Diagnosis not present

## 2021-08-04 DIAGNOSIS — F321 Major depressive disorder, single episode, moderate: Secondary | ICD-10-CM

## 2021-08-04 DIAGNOSIS — R109 Unspecified abdominal pain: Secondary | ICD-10-CM | POA: Diagnosis not present

## 2021-08-04 DIAGNOSIS — F329 Major depressive disorder, single episode, unspecified: Secondary | ICD-10-CM | POA: Insufficient documentation

## 2021-08-04 IMAGING — DX DG ABDOMEN 1V
1 series · 1 of 1 positions shown · non-contrast
Comparison: None.

CLINICAL DATA: Lower quadrent abdominal pain, post prandial,
?constipation

EXAM:
ABDOMEN - 1 VIEW

[t abdomen supine]
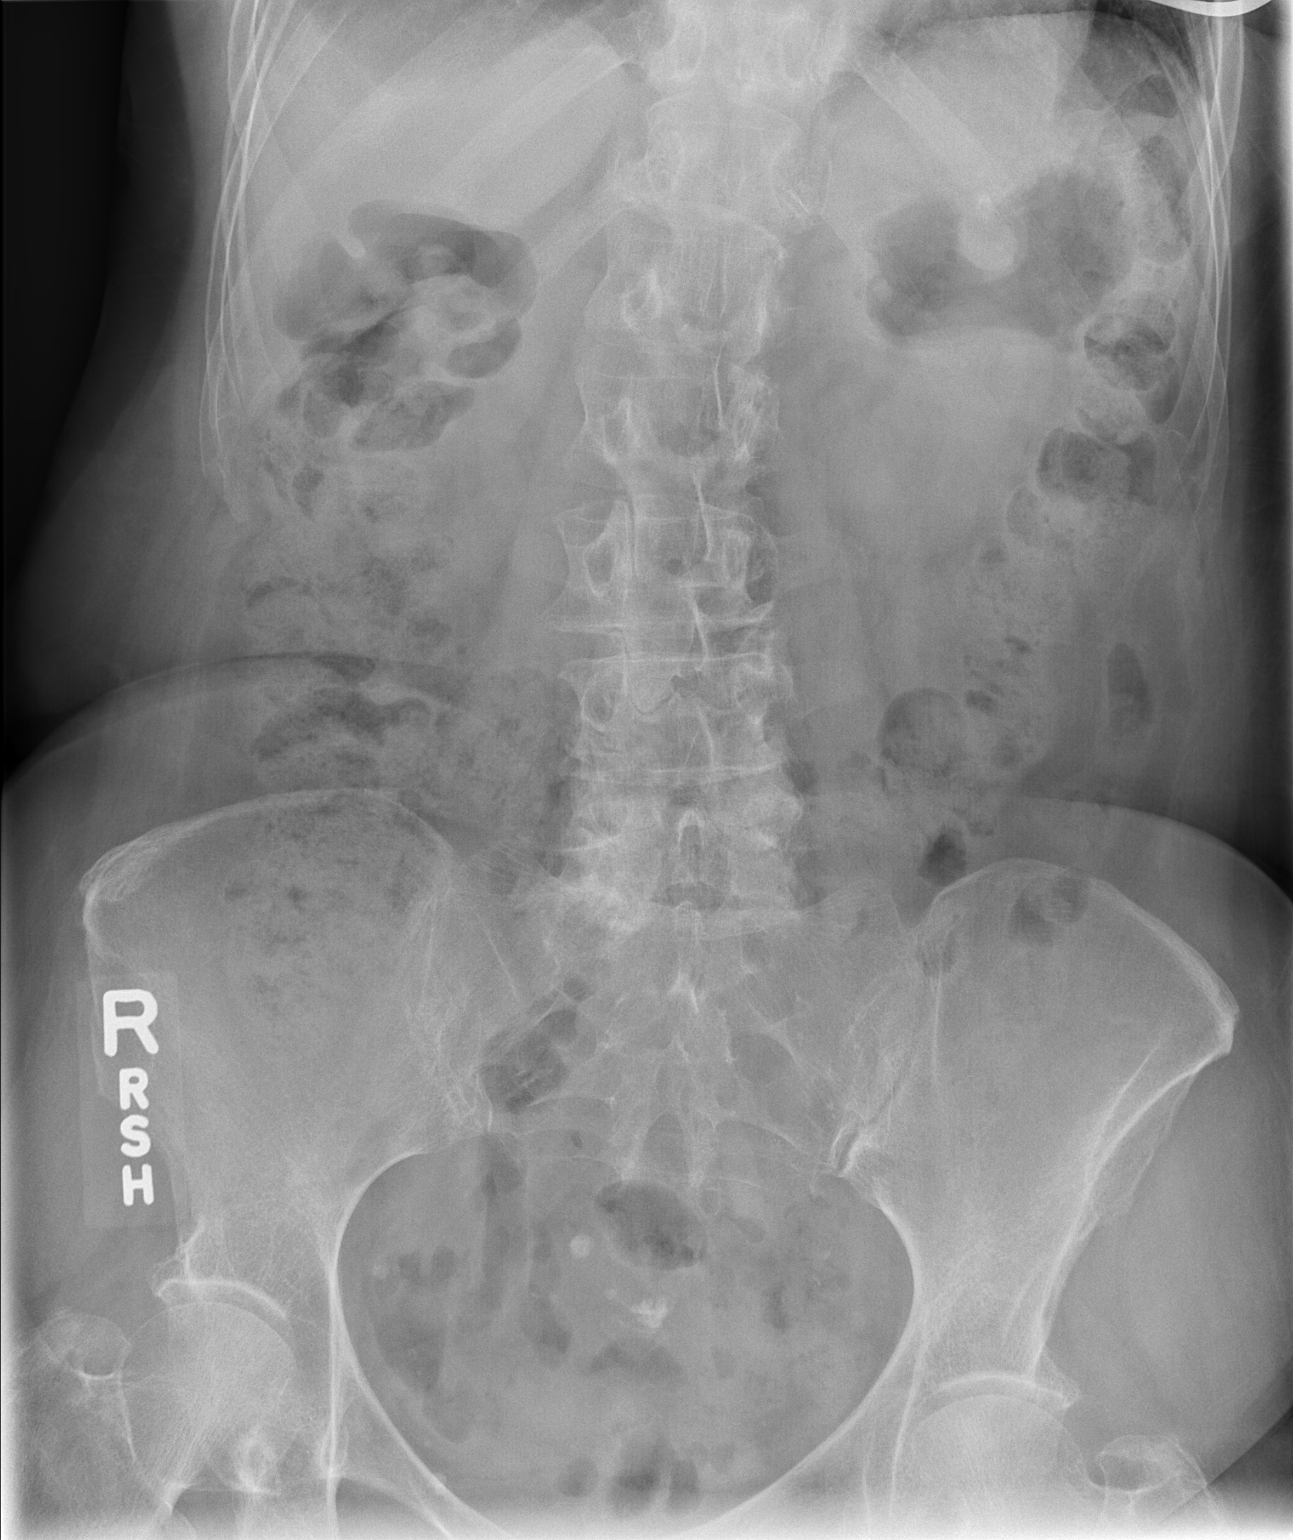

[1 of 1 positions shown; findings below may reference images not displayed]

FINDINGS: The bowel gas pattern is normal. Nonspecific rounded subcentimeter
densities overlying the pelvis which likely represent phleboliths
and fecaliths. Otherwise no radio-opaque calculi or other
significant radiographic abnormality are seen.
IMPRESSION: Negative.

## 2021-08-04 MED ORDER — ESCITALOPRAM OXALATE 10 MG PO TABS: 10.0000 mg | ORAL_TABLET | Freq: Every day | ORAL | 1 refills | Status: DC

## 2021-08-04 NOTE — Patient Instructions (Addendum)
Ms. Mcdowell,  It was a pleasure to see you as always. I am terribly sorry to hear about your brother. I have started you on a medication called lexapro for depression. Please take this once a day and follow up with me again in 4-6 weeks or my next available. If you have any issues, please give me a call.  I will call you with the results of your blood work and xray.   If you have any questions or concerns, call our clinic at [phone] or after hours call [phone] and ask for the internal medicine resident on call.   Thank you!  Dr. Darnell Level

## 2021-08-04 NOTE — Progress Notes (Signed)
Subjective:   Patient ID: Katherine Haynes female   DOB: 1950/10/24 71 y.o.   MRN: [id number]  HPI: Ms.Abie D Lucking is a 71 y.o. female with past medical history outlined below here for follow up of her lymphedema, depression, and abdominal pain. For the details of today's visit, please refer to the assessment and plan.  Past Medical History:  Diagnosis Date   Hyperlipidemia    Hypertension    Menopause syndrome    Current Outpatient Medications  Medication Sig Dispense Refill   amLODipine (NORVASC) 10 MG tablet TAKE 1 TABLET(10 MG) BY MOUTH DAILY (Patient taking differently: Take 10 mg by mouth daily.) 90 tablet 3   atorvastatin (LIPITOR) 40 MG tablet Take 1 tablet (40 mg total) by mouth daily. 90 tablet 3   furosemide (LASIX) 20 MG tablet Take 3 tablets (60 mg total) by mouth daily. 270 tablet 0   gabapentin (NEURONTIN) 600 MG tablet Take 1 tablet (600 mg total) by mouth at bedtime as needed. 90 tablet 1   naproxen (NAPROSYN) 500 MG tablet TAKE 1 TABLET(500 MG) BY MOUTH TWICE DAILY WITH A MEAL 60 tablet 2   potassium chloride (KLOR-CON) 10 MEQ tablet Take 1 tablet (10 mEq total) by mouth daily. 90 tablet 0   No current facility-administered medications for this visit.   Family History  Problem Relation Age of Onset   Heart disease Mother        "enlarged heart"   Hypertension Mother    Diabetes Mother    Heart disease Maternal Uncle        Bypass surg   Heart attack Maternal Grandmother        also had irregular heart beat requriring a pacemaker.   Hypertension Other        In multiple relatives.    Hypertension Paternal Grandmother    COPD Sister    Sarcoidosis Sister    Heart block Sister        s/p PPM   Hypertension Brother    Hypertension Sister    Arthritis Sister    COPD Sister    Diabetes Sister    Hypertension Sister    Arthritis Sister    Hypertension Brother    Healthy Brother    Healthy Brother    Healthy Sister    Breast cancer Neg Hx    Social  History   Socioeconomic History   Marital status: Married    Spouse name: Not on file   Number of children: Not on file   Years of education: Not on file   Highest education level: Not on file  Occupational History   Not on file  Tobacco Use   Smoking status: Former   Smokeless tobacco: Never   Tobacco comments:     1 PPD for 2-3 years. Quit ~4098.  Vaping Use   Vaping Use: Never used  Substance and Sexual Activity   Alcohol use: No   Drug use: No   Sexual activity: Yes    Birth control/protection: Surgical  Other Topics Concern   Not on file  Social History Narrative   Lives with husband. Good relationship, feels safe at home. Studying medical administration.       Current Social History 09/27/2018        Patient lives with spouse in an apartment on the second floor. There are 14 steps with handrails up to the entrance the patient uses.       Patient's method of transportation is personal car.  The highest level of education was high school diploma.      The patient currently is employed as a Engineer, site in patient's homes.      Identified important Relationships are "My husband, my kids, my grands and great grands."       Pets : None       Interests / Fun: Watching TV, going to beach and park, cooking out with family.       Current Stressors: "finances, teenagers" (grandkids)       Religious / Personal Beliefs: Methodist, "I was raised to treat people the way you want to be treated.          Social Determinants of Health   Financial Resource Strain: Not on file  Food Insecurity: Not on file  Transportation Needs: Not on file  Physical Activity: Not on file  Stress: Not on file  Social Connections: Not on file    Review of Systems: Review of Systems  Gastrointestinal:  Positive for abdominal pain and nausea. Negative for blood in stool, constipation, diarrhea, heartburn, melena and vomiting.  Psychiatric/Behavioral:  Positive for depression.  Negative for suicidal ideas.     Objective:  Physical Exam:  Vitals:   08/04/21 0909  BP: 120/60  Pulse: 83  Temp: 97.8 F (36.6 C)  TempSrc: Oral  SpO2: 100%  Weight: 141 lb 8 oz (64.2 kg)  Height: 5\' 6"  (1.676 m)    Physical Exam Constitutional:      Appearance: She is well-developed.  Cardiovascular:     Rate and Rhythm: Normal rate and regular rhythm.  Pulmonary:     Effort: Pulmonary effort is normal.     Breath sounds: Normal breath sounds.  Abdominal:     General: Abdomen is flat. Bowel sounds are normal. There is no distension.     Palpations: Abdomen is soft.     Tenderness: There is no abdominal tenderness.  Skin:    General: Skin is warm and dry.     Comments: Bilateral LE non pitting edema  Neurological:     Mental Status: She is alert.  Psychiatric:     Comments: Depressed mood     Assessment & Plan:   Vasomotor symptoms due to menopause Symptoms improved on increased dose of gabapentin, doing well on 600 mg nightly.  Continue this.  Major depression Patient was tearful during today's visit.  She has been struggling with grief and symptoms of depression since the unexpected passing of her younger brother last year. They were very close. He was a veteran who received care at the [place] in [place]. He was diagnosed with advanced stomach cancer and passed away two days later. She has been struggling with insomnia, anhedonia, and feelings of depression.  She feels very fatigued and has very little energy during the day.  We discussed treatment options including counseling and mediation. She is not interested in therapy at this time, but agreeable to starting an SSRI which I think will be beneficial for her. She was counseled on potential side effects including GI upset initially and sexual dysfunction.  -- Start lexapro 10 mg daily  -- Follow up 4-6 weeks   Lymphedema Patient has chronic idiopathic lymphedema of her bilateral lower extremities.  Swelling has  improved since restarting lasix but still present on exam. She has experienced a couple episodes of dizziness since restarting Lasix but increased her water intake and symptoms resolved. Currently denies dizziness. She reports decreased po intake and abdominal pain which I  addressed separately (see abdominal pain A&P). For her lymphedema, plan to continue lasix for now. Encouraged compression and elevation. BMP checked today WNL.  -- Continue lasix 60 mg daily  -- Continue potassium 10 mEq daily   Essential hypertension Chronic and well controlled on amlodipine 10 mg daily.  Renal function checked today was within normal limits.   Abdominal pain Patient here with complaint of intermittent, post prandial lower quadrant abdominal pain. Associated symptoms include nausea and 6lb weight loss since our last visit together in August. She reports decreased PO intake and poor appetite. She was experiencing similar symptoms in June of 2021. Labs at that time were unremarkable and we opted for watchful waiting. Symptoms eventually improved but returned a couple of months ago. She had a colonoscopy in 2021 that was unremarkable. She has a history of cholecystitis in 2018. Found to a 1.6 cm gallstone which was lodge in the gallbladder neck. She underwent cholecystectomy. On review of the op note, there is no mention of IOC. She reports her symptoms are similar to the pain she was experiencing before her surgery. Labs checked today are unremarkable aside from a very mild elevation in her alk phos. She denies diarrhea, reports normal BM every 2-3 days which is normal for her. KUB obtained today to rule out constipation / obstruction was unremarkable, showing normal bowel gas patter. Plan to proceed with CT abdomen / pelvis for further work up. If unremarkable, may need to consider RUQ ultrasound or MRCP to look for CBD stone.   Hyperlipidemia Lipid panel checked today, LDL at goal for primary prevention. Continue  Lipitor 40 mg daily.

## 2021-08-05 ENCOUNTER — Telehealth: Payer: Self-pay

## 2021-08-05 LAB — CBC
Hematocrit: 38.5 % (ref 34.0–46.6)
Hemoglobin: 13.2 g/dL (ref 11.1–15.9)
MCH: 31.1 pg (ref 26.6–33.0)
MCHC: 34.3 g/dL (ref 31.5–35.7)
MCV: 91 fL (ref 79–97)
Platelets: 239 10*3/uL (ref 150–450)
RBC: 4.24 x10E6/uL (ref 3.77–5.28)
RDW: 12 % (ref 11.7–15.4)
WBC: 5.1 10*3/uL (ref 3.4–10.8)

## 2021-08-05 LAB — CMP14 + ANION GAP
ALT: 22 IU/L (ref 0–32)
AST: 39 IU/L (ref 0–40)
Albumin/Globulin Ratio: 1.4 (ref 1.2–2.2)
Albumin: 4.2 g/dL (ref 3.8–4.8)
Alkaline Phosphatase: 128 IU/L — ABNORMAL HIGH (ref 44–121)
Anion Gap: 14 mmol/L (ref 10.0–18.0)
BUN/Creatinine Ratio: 10 — ABNORMAL LOW (ref 12–28)
BUN: 9 mg/dL (ref 8–27)
Bilirubin Total: 0.4 mg/dL (ref 0.0–1.2)
CO2: 28 mmol/L (ref 20–29)
Calcium: 9 mg/dL (ref 8.7–10.3)
Chloride: 97 mmol/L (ref 96–106)
Creatinine, Ser: 0.89 mg/dL (ref 0.57–1.00)
Globulin, Total: 3 g/dL (ref 1.5–4.5)
Glucose: 126 mg/dL — ABNORMAL HIGH (ref 70–99)
Potassium: 3.5 mmol/L (ref 3.5–5.2)
Sodium: 139 mmol/L (ref 134–144)
Total Protein: 7.2 g/dL (ref 6.0–8.5)
eGFR: 71 mL/min/{1.73_m2} (ref >59)

## 2021-08-05 LAB — LIPID PANEL
Chol/HDL Ratio: 2.2 ratio (ref 0.0–4.4)
Cholesterol, Total: 158 mg/dL (ref 100–199)
HDL: 73 mg/dL (ref >39)
LDL Chol Calc (NIH): 67 mg/dL (ref 0–99)
Triglycerides: 101 mg/dL (ref 0–149)
VLDL Cholesterol Cal: 18 mg/dL (ref 5–40)

## 2021-08-05 NOTE — Telephone Encounter (Signed)
Pt is requesting a call back .. she stated that she saw her results from her OV 1/18 and she is wanting her someone  to go over them with her

## 2021-08-06 ENCOUNTER — Encounter: Payer: Self-pay | Admitting: Internal Medicine

## 2021-08-06 NOTE — Assessment & Plan Note (Signed)
Patient was tearful during today's visit.  She has been struggling with grief and symptoms of depression since the unexpected passing of her younger brother last year. They were very close. He was a veteran who received care at the [place] in [place]. He was diagnosed with advanced stomach cancer and passed away two days later. She has been struggling with insomnia, anhedonia, and feelings of depression.  She feels very fatigued and has very little energy during the day.  We discussed treatment options including counseling and mediation. She is not interested in therapy at this time, but agreeable to starting an SSRI which I think will be beneficial for her. She was counseled on potential side effects including GI upset initially and sexual dysfunction.  -- Start lexapro 10 mg daily  -- Follow up 4-6 weeks

## 2021-08-06 NOTE — Assessment & Plan Note (Signed)
Patient has chronic idiopathic lymphedema of her bilateral lower extremities.  Swelling has improved since restarting lasix but still present on exam. She has experienced a couple episodes of dizziness since restarting Lasix but increased her water intake and symptoms resolved. Currently denies dizziness. She reports decreased po intake and abdominal pain which I addressed separately (see abdominal pain A&P). For her lymphedema, plan to continue lasix for now. Encouraged compression and elevation. BMP checked today WNL.  -- Continue lasix 60 mg daily  -- Continue potassium 10 mEq daily

## 2021-08-06 NOTE — Assessment & Plan Note (Addendum)
Patient here with complaint of intermittent, post prandial lower quadrant abdominal pain. Associated symptoms include nausea and 6lb weight loss since our last visit together in August. She reports decreased PO intake and poor appetite. She was experiencing similar symptoms in June of 2021. Labs at that time were unremarkable and we opted for watchful waiting. Symptoms eventually improved but returned a couple of months ago. She had a colonoscopy in 2021 that was unremarkable. She has a history of cholecystitis in 2018. Found to a 1.6 cm gallstone which was lodge in the gallbladder neck. She underwent cholecystectomy. On review of the op note, there is no mention of IOC. She reports her symptoms are similar to the pain she was experiencing before her surgery. Labs checked today are unremarkable aside from a very mild elevation in her alk phos. She denies diarrhea, reports normal BM every 2-3 days which is normal for her. KUB obtained today to rule out constipation / obstruction was unremarkable, showing normal bowel gas patter. Plan to proceed with CT abdomen / pelvis for further work up. If unremarkable, may need to consider RUQ ultrasound or MRCP to look for CBD stone.

## 2021-08-06 NOTE — Assessment & Plan Note (Signed)
Chronic and well controlled on amlodipine 10 mg daily.  Renal function checked today was within normal limits.

## 2021-08-06 NOTE — Assessment & Plan Note (Signed)
Symptoms improved on increased dose of gabapentin, doing well on 600 mg nightly.  Continue this.

## 2021-08-06 NOTE — Assessment & Plan Note (Signed)
Lipid panel checked today, LDL at goal for primary prevention. Continue Lipitor 40 mg daily.

## 2021-08-10 ENCOUNTER — Encounter: Payer: Self-pay | Admitting: Internal Medicine

## 2021-08-10 NOTE — Telephone Encounter (Signed)
Received a mychart message from patient asking about scheduling her CT scan. I ordered as routine this past Friday. Chilon, can you help with this?

## 2021-08-11 NOTE — Telephone Encounter (Signed)
Thank you Chilon.  

## 2021-08-16 ENCOUNTER — Other Ambulatory Visit: Payer: Self-pay | Admitting: Internal Medicine

## 2021-08-16 ENCOUNTER — Telehealth: Payer: Self-pay | Admitting: *Deleted

## 2021-08-16 ENCOUNTER — Encounter: Payer: Self-pay | Admitting: *Deleted

## 2021-08-16 DIAGNOSIS — Z91041 Radiographic dye allergy status: Secondary | ICD-10-CM

## 2021-08-16 MED ORDER — DIPHENHYDRAMINE HCL 25 MG PO CAPS: ORAL_CAPSULE | ORAL | 0 refills | Status: DC

## 2021-08-16 MED ORDER — PREDNISONE 50 MG PO TABS: ORAL_TABLET | ORAL | 0 refills | Status: DC

## 2021-08-16 NOTE — Telephone Encounter (Signed)
Called pt to let her know of which meds she needs to take prior to CT scan on 08/18/21. Pt stated she's currently driving - informed her I will sent Dr Rivka Safer instructions via her My Chart. And meds have been sent to [facility]. And I ask pt to let me know when she has received the instructions after she's able to view My Chart - stated she will.

## 2021-08-16 NOTE — Progress Notes (Signed)
Spoke with radiology regarding contrast allergy protocol. Sent in Rx for prednisone 50 mg tablets with instructions to take 1 tablet 13 hours before her scheduled CT scan, again 7 hours before the scan, and the last tablet 1 hour before her CT scan. Also sent Rx for benadryl, 50 mg to be taken 1 hour prior to CT scan with her last dose of prednisone. Please let patient know. Thanks!

## 2021-08-16 NOTE — Telephone Encounter (Signed)
Thank you :)

## 2021-08-18 ENCOUNTER — Other Ambulatory Visit: Payer: Self-pay

## 2021-08-18 ENCOUNTER — Ambulatory Visit (HOSPITAL_COMMUNITY)
Admission: RE | Admit: 2021-08-18 | Discharge: 2021-08-18 | Disposition: A | Discharge status: Home / Self Care | Payer: Medicare Other | Source: Ambulatory Visit | Attending: Internal Medicine | Admitting: Internal Medicine

## 2021-08-18 DIAGNOSIS — R1031 Right lower quadrant pain: Secondary | ICD-10-CM | POA: Insufficient documentation

## 2021-08-18 DIAGNOSIS — R109 Unspecified abdominal pain: Secondary | ICD-10-CM | POA: Diagnosis not present

## 2021-08-18 DIAGNOSIS — I700 Atherosclerosis of aorta: Secondary | ICD-10-CM | POA: Diagnosis not present

## 2021-08-18 IMAGING — CT CT ABD-PELV W/ CM
2 of 5 series · 16 of 46 positions shown, 18 images · IV contrast (agent unspecified)
Comparison: None.

CLINICAL DATA: Right lower quadrant abdominal pain.

EXAM:
CT ABDOMEN AND PELVIS WITH CONTRAST
TECHNIQUE: Multidetector CT imaging of the abdomen and pelvis was performed
using the standard protocol following bolus administration of
intravenous contrast.

[Series 2: axial st · axial · 0.68mm/px · z∈[+1136,+1466]mm · 13 of 78 slices shown, 15 images]
[im 6/78  soft-tissue]
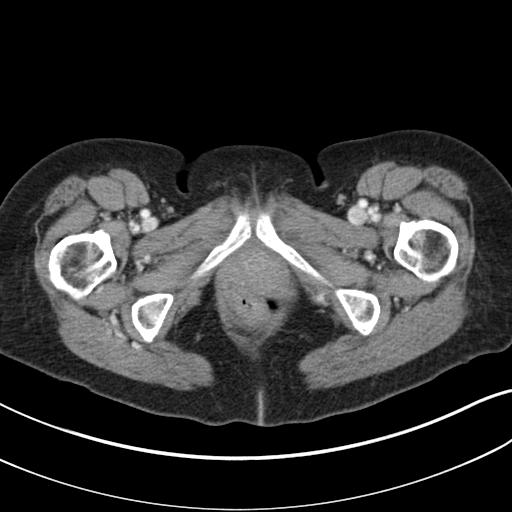
[im 6/78  bone]
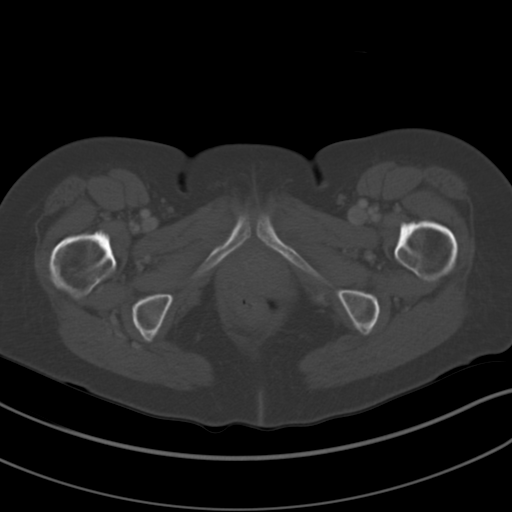
[im 11/78  soft-tissue]
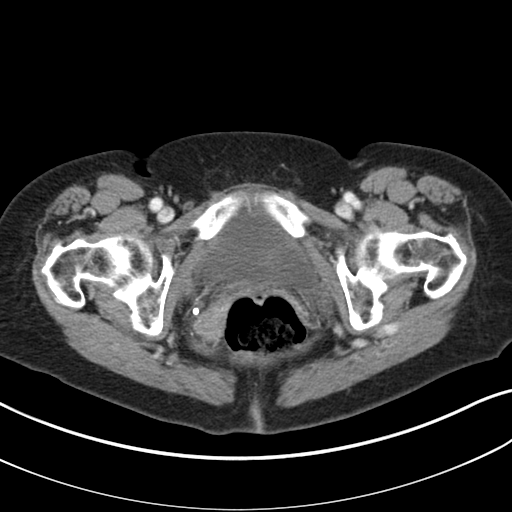
[im 16/78  soft-tissue]
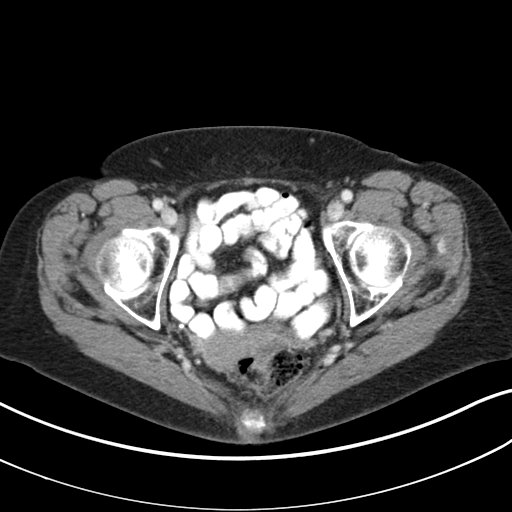
[im 21/78  soft-tissue]
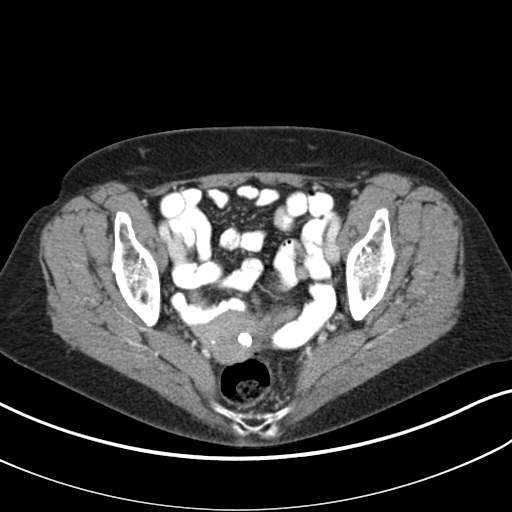
[im 26/78  soft-tissue]
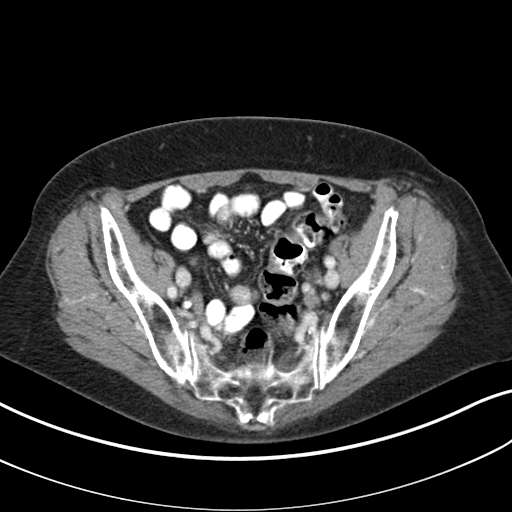
[im 31/78  soft-tissue]
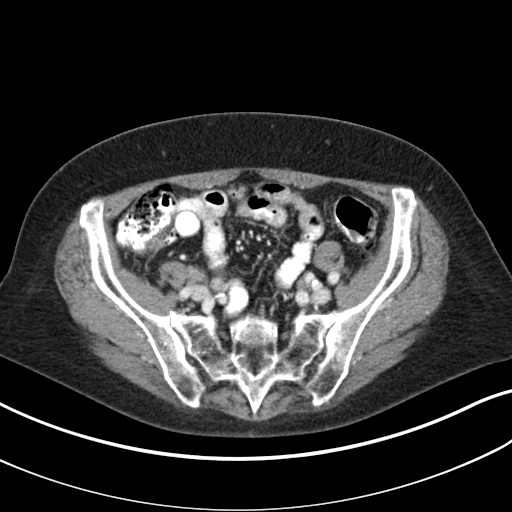
[im 42/78  soft-tissue]
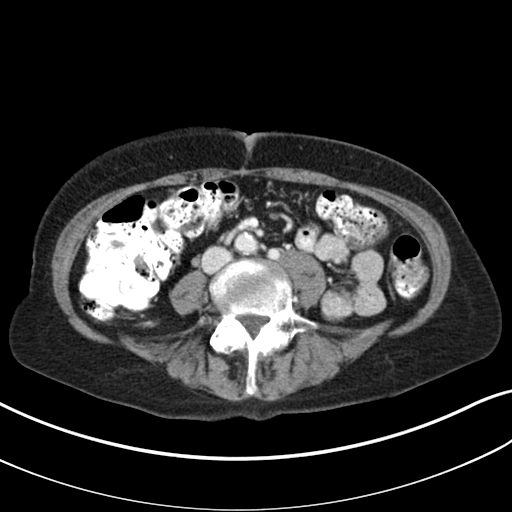
[im 47/78  soft-tissue]
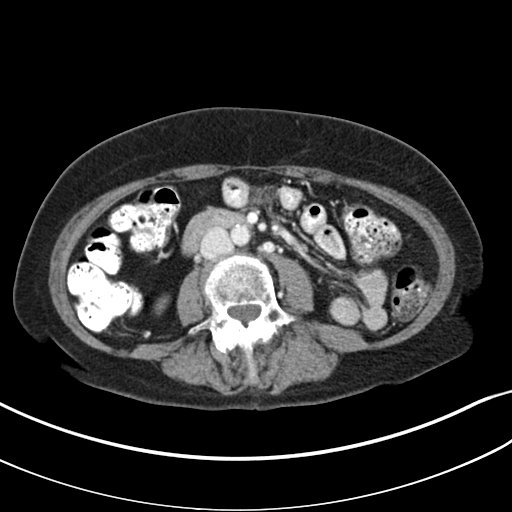
[im 52/78  soft-tissue]
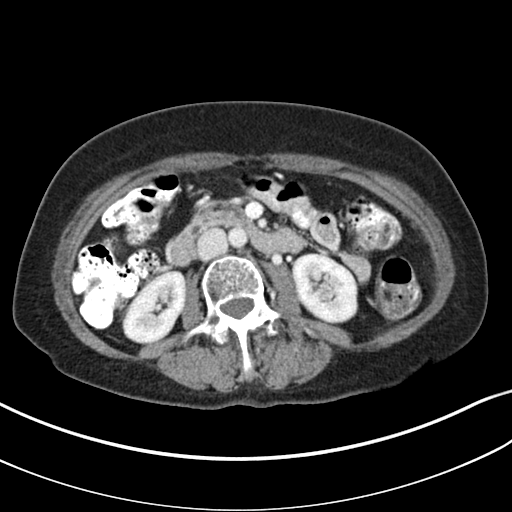
[im 52/78  bone]
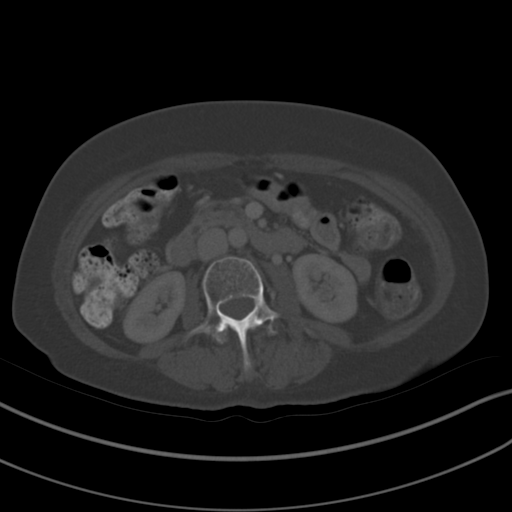
[im 57/78  soft-tissue]
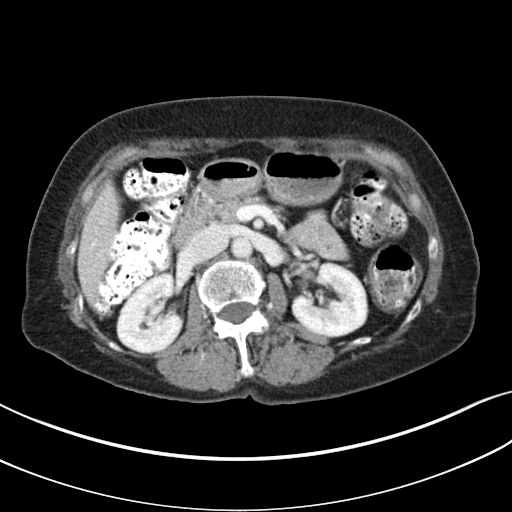
[im 62/78  soft-tissue]
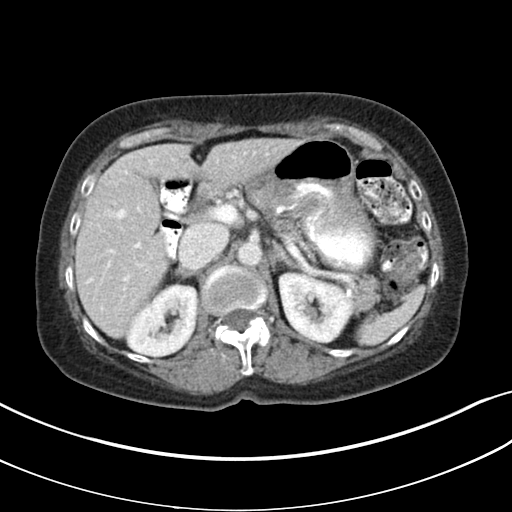
[im 67/78  soft-tissue]
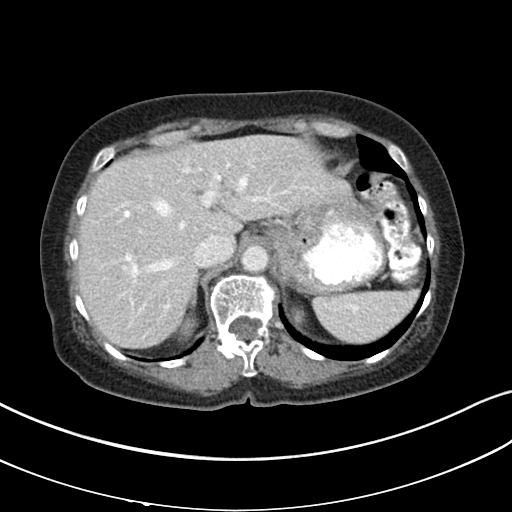
[im 72/78  soft-tissue]
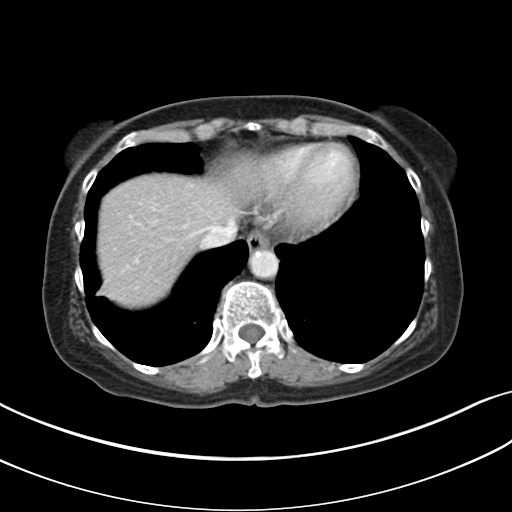

[Series 5: coronal st · coronal · 0.65mm/px · 3 of 121 slices shown]
[im 41/121  soft-tissue]
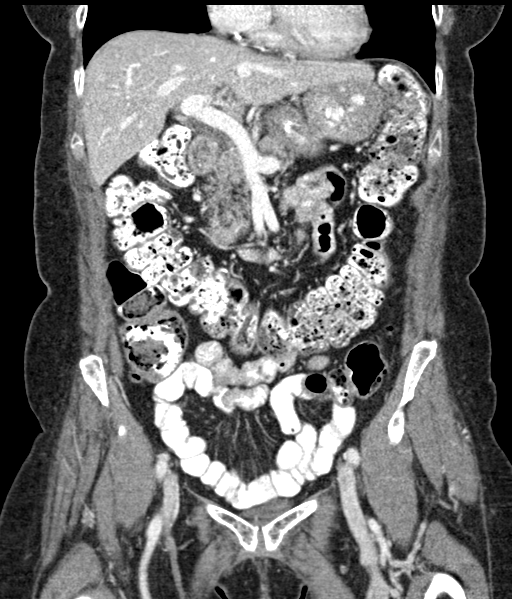
[im 54/121  soft-tissue]
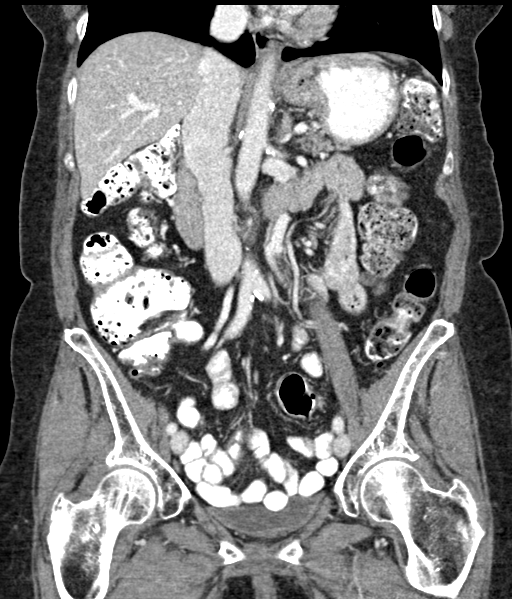
[im 67/121  soft-tissue]
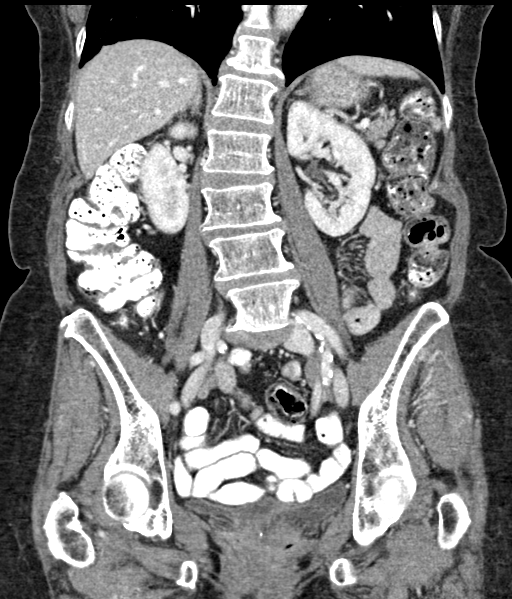

[16 of 46 positions shown; findings below may reference images not displayed]

RADIATION DOSE REDUCTION: This exam was performed according to the
departmental dose-optimization program which includes automated
exposure control, adjustment of the mA and/or kV according to
patient size and/or use of iterative reconstruction technique.

CONTRAST:  100mL OMNIPAQUE IOHEXOL 300 MG/ML  SOLN
FINDINGS: Lower chest: Coronary artery calcification is noted. Atelectasis is
noted at the lung bases.

Hepatobiliary: No focal liver abnormality is seen. No biliary ductal
dilatation is seen. The gallbladder is not visualized on exam.

Pancreas: Unremarkable. No pancreatic ductal dilatation or
surrounding inflammatory changes.

Spleen: Normal in size without focal abnormality.

Adrenals/Urinary Tract: Adrenal glands are unremarkable. Kidneys are
normal, without renal calculi, focal lesion, or hydronephrosis.
Bladder is unremarkable.

Stomach/Bowel: The stomach is within normal limits. No bowel
obstruction, free air, or pneumatosis. A moderate to large amount of
retained stool is present in the colon. The appendix is within
normal limits no focal bowel wall thickening.

Vascular/Lymphatic: Aortic atherosclerosis. No enlarged abdominal or
pelvic lymph nodes.

Reproductive: Coarse calcifications are present in the uterus
suggesting degenerating fibroids. No adnexal mass.

Other: No abdominal wall hernia or abnormality. No abdominopelvic
ascites.

Musculoskeletal: Mild degenerative changes in the thoracolumbar
spine. No acute osseous abnormality.
IMPRESSION: 1. No acute intra-abdominal process.  Normal appendix.
2. Large amount of retained stool in the colon suggesting
constipation.
3. Aortic atherosclerosis.
4. Coronary artery calcification.

## 2021-08-18 MED ORDER — SODIUM CHLORIDE (PF) 0.9 % IJ SOLN
INTRAMUSCULAR | Status: AC
  Filled 2021-08-18: qty 50

## 2021-08-18 MED ORDER — IOHEXOL 300 MG/ML  SOLN
100.0000 mL | Freq: Once | INTRAMUSCULAR | Status: AC | PRN
  Administered 2021-08-18: 100 mL via INTRAVENOUS

## 2021-09-15 ENCOUNTER — Encounter: Payer: Medicare Other | Admitting: Internal Medicine

## 2021-09-15 ENCOUNTER — Encounter: Payer: Self-pay | Admitting: Internal Medicine

## 2021-09-17 ENCOUNTER — Other Ambulatory Visit: Payer: Self-pay | Admitting: Internal Medicine

## 2021-09-17 DIAGNOSIS — I10 Essential (primary) hypertension: Secondary | ICD-10-CM

## 2021-10-18 DIAGNOSIS — H5203 Hypermetropia, bilateral: Secondary | ICD-10-CM | POA: Diagnosis not present

## 2021-10-18 DIAGNOSIS — H524 Presbyopia: Secondary | ICD-10-CM | POA: Diagnosis not present

## 2021-10-18 DIAGNOSIS — H2513 Age-related nuclear cataract, bilateral: Secondary | ICD-10-CM | POA: Diagnosis not present

## 2021-10-18 DIAGNOSIS — H52223 Regular astigmatism, bilateral: Secondary | ICD-10-CM | POA: Diagnosis not present

## 2021-12-01 ENCOUNTER — Encounter: Payer: Medicare Other | Admitting: Internal Medicine

## 2021-12-08 ENCOUNTER — Ambulatory Visit (INDEPENDENT_AMBULATORY_CARE_PROVIDER_SITE_OTHER): Payer: Medicare Other | Admitting: Internal Medicine

## 2021-12-08 ENCOUNTER — Other Ambulatory Visit: Payer: Self-pay

## 2021-12-08 ENCOUNTER — Encounter: Payer: Self-pay | Admitting: Internal Medicine

## 2021-12-08 VITALS — BP 121/63 | HR 74 | Temp 97.7°F | Ht 66.0 in | Wt 141.3 lb

## 2021-12-08 DIAGNOSIS — M5481 Occipital neuralgia: Secondary | ICD-10-CM | POA: Insufficient documentation

## 2021-12-08 DIAGNOSIS — H8113 Benign paroxysmal vertigo, bilateral: Secondary | ICD-10-CM

## 2021-12-08 DIAGNOSIS — I890 Lymphedema, not elsewhere classified: Secondary | ICD-10-CM

## 2021-12-08 DIAGNOSIS — I10 Essential (primary) hypertension: Secondary | ICD-10-CM | POA: Diagnosis not present

## 2021-12-08 DIAGNOSIS — M542 Cervicalgia: Secondary | ICD-10-CM | POA: Diagnosis not present

## 2021-12-08 DIAGNOSIS — R1030 Lower abdominal pain, unspecified: Secondary | ICD-10-CM

## 2021-12-08 DIAGNOSIS — E785 Hyperlipidemia, unspecified: Secondary | ICD-10-CM | POA: Diagnosis not present

## 2021-12-08 DIAGNOSIS — E876 Hypokalemia: Secondary | ICD-10-CM | POA: Diagnosis not present

## 2021-12-08 DIAGNOSIS — H8110 Benign paroxysmal vertigo, unspecified ear: Secondary | ICD-10-CM | POA: Insufficient documentation

## 2021-12-08 DIAGNOSIS — Z1231 Encounter for screening mammogram for malignant neoplasm of breast: Secondary | ICD-10-CM

## 2021-12-08 DIAGNOSIS — F321 Major depressive disorder, single episode, moderate: Secondary | ICD-10-CM | POA: Diagnosis not present

## 2021-12-08 DIAGNOSIS — Z23 Encounter for immunization: Secondary | ICD-10-CM | POA: Diagnosis not present

## 2021-12-08 DIAGNOSIS — R600 Localized edema: Secondary | ICD-10-CM | POA: Diagnosis not present

## 2021-12-08 DIAGNOSIS — N951 Menopausal and female climacteric states: Secondary | ICD-10-CM

## 2021-12-08 DIAGNOSIS — Z87891 Personal history of nicotine dependence: Secondary | ICD-10-CM

## 2021-12-08 HISTORY — DX: Benign paroxysmal vertigo, unspecified ear: H81.10

## 2021-12-08 MED ORDER — SHINGRIX 50 MCG/0.5ML IM SUSR: 0.5000 mL | Freq: Once | INTRAMUSCULAR | 1 refills | Status: DC

## 2021-12-08 MED ORDER — AMLODIPINE BESYLATE 10 MG PO TABS: 10.0000 mg | ORAL_TABLET | Freq: Every day | ORAL | 3 refills | Status: DC

## 2021-12-08 MED ORDER — GABAPENTIN 600 MG PO TABS: 600.0000 mg | ORAL_TABLET | Freq: Every evening | ORAL | 3 refills | Status: DC | PRN

## 2021-12-08 MED ORDER — ESCITALOPRAM OXALATE 20 MG PO TABS: 20.0000 mg | ORAL_TABLET | Freq: Every day | ORAL | 3 refills | Status: DC

## 2021-12-08 MED ORDER — ATORVASTATIN CALCIUM 40 MG PO TABS: 40.0000 mg | ORAL_TABLET | Freq: Every day | ORAL | 3 refills | Status: DC

## 2021-12-08 MED ORDER — FUROSEMIDE 20 MG PO TABS: 60.0000 mg | ORAL_TABLET | Freq: Every day | ORAL | 3 refills | Status: DC

## 2021-12-08 MED ORDER — NAPROXEN 500 MG PO TABS: 500.0000 mg | ORAL_TABLET | Freq: Two times a day (BID) | ORAL | 2 refills | Status: AC

## 2021-12-08 MED ORDER — ZOSTER VAC RECOMB ADJUVANTED 50 MCG/0.5ML IM SUSR: 0.5000 mL | Freq: Once | INTRAMUSCULAR | 1 refills | Status: AC

## 2021-12-08 NOTE — Assessment & Plan Note (Signed)
Much improved on lexapro 10 mg daily, but still has some uncontrolled sxs. Given partial response, offered increasing dose which she would like to try. Sent new rx for 20 mg daily.

## 2021-12-08 NOTE — Progress Notes (Signed)
Subjective:   Patient ID: Katherine Haynes female   DOB: 07-Apr-1951 71 y.o.   MRN: [id number]  HPI: [name] is a 71 y.o. female with past medical history outlined below here for follow up of HTN. For the details of today's visit, please refer to the assessment and plan.   Past Medical History:  Diagnosis Date   Hyperlipidemia    Hypertension    Menopause syndrome    Current Outpatient Medications  Medication Sig Dispense Refill   Zoster Vaccine Adjuvanted [facility]) injection Inject 0.5 mLs into the muscle once for 1 dose. 0.5 mL 1   amLODipine (NORVASC) 10 MG tablet Take 1 tablet (10 mg total) by mouth daily. 90 tablet 3   atorvastatin (LIPITOR) 40 MG tablet Take 1 tablet (40 mg total) by mouth daily. 90 tablet 3   diphenhydrAMINE (BENADRYL ALLERGY) 25 mg capsule Please take 50 mg (2 tablets) 1 hour prior to your CT scan. 30 capsule 0   escitalopram (LEXAPRO) 20 MG tablet Take 1 tablet (20 mg total) by mouth daily. 90 tablet 3   furosemide (LASIX) 20 MG tablet Take 3 tablets (60 mg total) by mouth daily. 270 tablet 3   gabapentin (NEURONTIN) 600 MG tablet Take 1 tablet (600 mg total) by mouth at bedtime as needed. 90 tablet 3   naproxen (NAPROSYN) 500 MG tablet Take 1 tablet (500 mg total) by mouth 2 (two) times daily with a meal. 60 tablet 2   potassium chloride (KLOR-CON) 10 MEQ tablet Take 1 tablet (10 mEq total) by mouth daily. 90 tablet 0   No current facility-administered medications for this visit.   Family History  Problem Relation Age of Onset   Heart disease Mother        "enlarged heart"   Hypertension Mother    Diabetes Mother    Heart disease Maternal Uncle        Bypass surg   Heart attack Maternal Grandmother        also had irregular heart beat requriring a pacemaker.   Hypertension Other        In multiple relatives.    Hypertension Paternal Grandmother    COPD Sister    Sarcoidosis Sister    Heart block Sister        s/p PPM   Hypertension  Brother    Hypertension Sister    Arthritis Sister    COPD Sister    Diabetes Sister    Hypertension Sister    Arthritis Sister    Hypertension Brother    Healthy Brother    Healthy Brother    Healthy Sister    Breast cancer Neg Hx    Social History   Socioeconomic History   Marital status: Married    Spouse name: Not on file   Number of children: Not on file   Years of education: Not on file   Highest education level: Not on file  Occupational History   Not on file  Tobacco Use   Smoking status: Former   Smokeless tobacco: Never   Tobacco comments:     1 PPD for 2-3 years. Quit ~7829.  Vaping Use   Vaping Use: Never used  Substance and Sexual Activity   Alcohol use: No   Drug use: No   Sexual activity: Yes    Birth control/protection: Surgical  Other Topics Concern   Not on file  Social History Narrative   Lives with husband. Good relationship, feels safe at home. Studying  medical administration.       Current Social History 09/27/2018        Patient lives with spouse in an apartment on the second floor. There are 14 steps with handrails up to the entrance the patient uses.       Patient's method of transportation is personal car.      The highest level of education was high school diploma.      The patient currently is employed as a Engineer, site in patient's homes.      Identified important Relationships are "My husband, my kids, my grands and great grands."       Pets : None       Interests / Fun: Watching TV, going to beach and park, cooking out with family.       Current Stressors: "finances, teenagers" (grandkids)       Religious / Personal Beliefs: [employer], "I was raised to treat people the way you want to be treated.          Social Determinants of Health   Financial Resource Strain: Not on file  Food Insecurity: Not on file  Transportation Needs: Not on file  Physical Activity: Not on file  Stress: Not on file  Social Connections: Not  on file    Objective:  Physical Exam:  Vitals:   12/08/21 0857  BP: 121/63  Pulse: 74  Temp: 97.7 F (36.5 C)  TempSrc: Oral  SpO2: 100%  Weight: 141 lb 4.8 oz (64.1 kg)  Height: 5\' 6"  (1.676 m)    Constitutional: NAD, well appearing  HEENT: + Dix Hallpike bilaterally, but nystagmus more pronounced L>R  Cardiovascular: RRR Pulmonary/Chest: clear bilaterally, normal effort  Extremities: Bilateral non pitting edema, warm and well perfused  Assessment & Plan:   Abdominal pain This has resolved - 2/2 to constipation. See recent CT abdomen / pelvis result note. She is taking miralax ~2 x weekly with good results. Reports regular BMs and abdominal pain has resolved.   Essential hypertension Chronic and well controlled 121/63. Continue amlodipine 10 mg daily and lasix 60 mg daily. Checking CMP.   Vasomotor symptoms due to menopause Refilled gabapentin 600 mg QHS prn.   Screening mammogram for breast cancer Order for screening mammogram placed.   Need for shingles vaccine Provided printed Rx for shingrex.   Major depression Much improved on lexapro 10 mg daily, but still has some uncontrolled sxs. Given partial response, offered increasing dose which she would like to try. Sent new rx for 20 mg daily.   BPPV (benign paroxysmal positional vertigo) Patient reports recurrent, transient vertigo usually very brief lasting < 60 seconds. Gilberto Better performed today with bilateral response, but nystagmus more pronounced on the left. We completed the Epley maneuver bilaterally and I provided her educational handouts on this.   Occipital neuralgia of left side Patient reported 3 days of electric shock like sensation over her posterior scalp and behind her left ear. Pain resolved on it's own. Provided reassurance, possibly an episode of occipital neuralgia. Monitor for recurrence.   Lymphedema Managed relatively well with lasix 60 mg daily. Still has some non pitting edema of her  lower extremities but only takes a partial lasix dose when leaving the house. Reports noticeable improvement on lasix. Rechecking CMP today. Will refill her potasium pending results.

## 2021-12-08 NOTE — Assessment & Plan Note (Signed)
Order for screening mammogram placed.  

## 2021-12-08 NOTE — Patient Instructions (Signed)
Katherine Haynes,  It was a pleasure to see you today. I am sorry to hear about your vertigo. I suspect this is Benign paroxysmal positional vertigo (BPPV). The maneuver we did today to treat this is called the Epley maneuver.   I will call you with the results of your blood work. Please follow up with me again in 6 months. If you have any questions or concerns, call our clinic at [phone] or after hours call [phone] and ask for the internal medicine resident on call.   Thank you!  Dr. Darnell Level

## 2021-12-08 NOTE — Assessment & Plan Note (Signed)
Patient reports recurrent, transient vertigo usually very brief lasting < 60 seconds. Marye Round performed today with bilateral response, but nystagmus more pronounced on the left. We completed the Epley maneuver bilaterally and I provided her educational handouts on this.

## 2021-12-08 NOTE — Assessment & Plan Note (Signed)
This has resolved - 2/2 to constipation. See recent CT abdomen / pelvis result note. She is taking miralax ~2 x weekly with good results. Reports regular BMs and abdominal pain has resolved.

## 2021-12-08 NOTE — Assessment & Plan Note (Signed)
Patient reported 3 days of electric shock like sensation over her posterior scalp and behind her left ear. Pain resolved on it's own. Provided reassurance, possibly an episode of occipital neuralgia. Monitor for recurrence.

## 2021-12-08 NOTE — Assessment & Plan Note (Signed)
Provided printed Rx for shingrex.

## 2021-12-08 NOTE — Assessment & Plan Note (Signed)
Managed relatively well with lasix 60 mg daily. Still has some non pitting edema of her lower extremities but only takes a partial lasix dose when leaving the house. Reports noticeable improvement on lasix. Rechecking CMP today. Will refill her potasium pending results.

## 2021-12-08 NOTE — Assessment & Plan Note (Signed)
Chronic and well controlled 121/63. Continue amlodipine 10 mg daily and lasix 60 mg daily. Checking CMP.

## 2021-12-08 NOTE — Assessment & Plan Note (Signed)
Refilled gabapentin 600 mg QHS prn.

## 2021-12-09 ENCOUNTER — Other Ambulatory Visit: Payer: Self-pay | Admitting: Internal Medicine

## 2021-12-09 DIAGNOSIS — E876 Hypokalemia: Secondary | ICD-10-CM

## 2021-12-09 LAB — CMP14 + ANION GAP
ALT: 38 IU/L — ABNORMAL HIGH (ref 0–32)
AST: 53 IU/L — ABNORMAL HIGH (ref 0–40)
Albumin/Globulin Ratio: 1.5 (ref 1.2–2.2)
Albumin: 4.6 g/dL (ref 3.8–4.8)
Alkaline Phosphatase: 134 IU/L — ABNORMAL HIGH (ref 44–121)
Anion Gap: 16 mmol/L (ref 10.0–18.0)
BUN/Creatinine Ratio: 8 — ABNORMAL LOW (ref 12–28)
BUN: 7 mg/dL — ABNORMAL LOW (ref 8–27)
Bilirubin Total: 0.7 mg/dL (ref 0.0–1.2)
CO2: 27 mmol/L (ref 20–29)
Calcium: 9.6 mg/dL (ref 8.7–10.3)
Chloride: 98 mmol/L (ref 96–106)
Creatinine, Ser: 0.87 mg/dL (ref 0.57–1.00)
Globulin, Total: 3 g/dL (ref 1.5–4.5)
Glucose: 103 mg/dL — ABNORMAL HIGH (ref 70–99)
Potassium: 3.9 mmol/L (ref 3.5–5.2)
Sodium: 141 mmol/L (ref 134–144)
Total Protein: 7.6 g/dL (ref 6.0–8.5)
eGFR: 73 mL/min/{1.73_m2} (ref >59)

## 2021-12-09 MED ORDER — POTASSIUM CHLORIDE ER 10 MEQ PO TBCR: 10.0000 meq | EXTENDED_RELEASE_TABLET | Freq: Every day | ORAL | 3 refills | Status: DC

## 2021-12-28 ENCOUNTER — Ambulatory Visit
Admission: RE | Admit: 2021-12-28 | Discharge: 2021-12-28 | Disposition: A | Discharge status: Home / Self Care | Payer: Medicare Other | Source: Ambulatory Visit | Attending: Internal Medicine | Admitting: Internal Medicine

## 2021-12-28 DIAGNOSIS — Z1231 Encounter for screening mammogram for malignant neoplasm of breast: Secondary | ICD-10-CM

## 2021-12-28 IMAGING — MG MM DIGITAL SCREENING BILAT W/ TOMO AND CAD
8 series · 9 of 24 positions shown · non-contrast
Comparison: Previous exam(s).

CLINICAL DATA: Screening.

EXAM:
DIGITAL SCREENING BILATERAL MAMMOGRAM WITH TOMOSYNTHESIS AND CAD
TECHNIQUE: Bilateral screening digital craniocaudal and mediolateral oblique
mammograms were obtained. Bilateral screening digital breast
tomosynthesis was performed. The images were evaluated with
computer-aided detection.

[L MLO synth-2D]
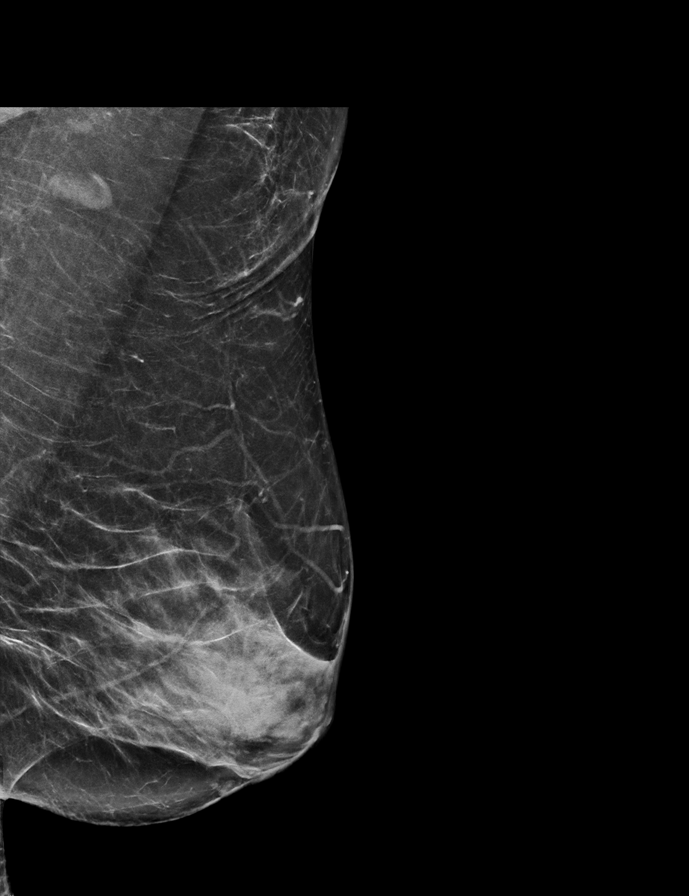

[R CC synth-2D]
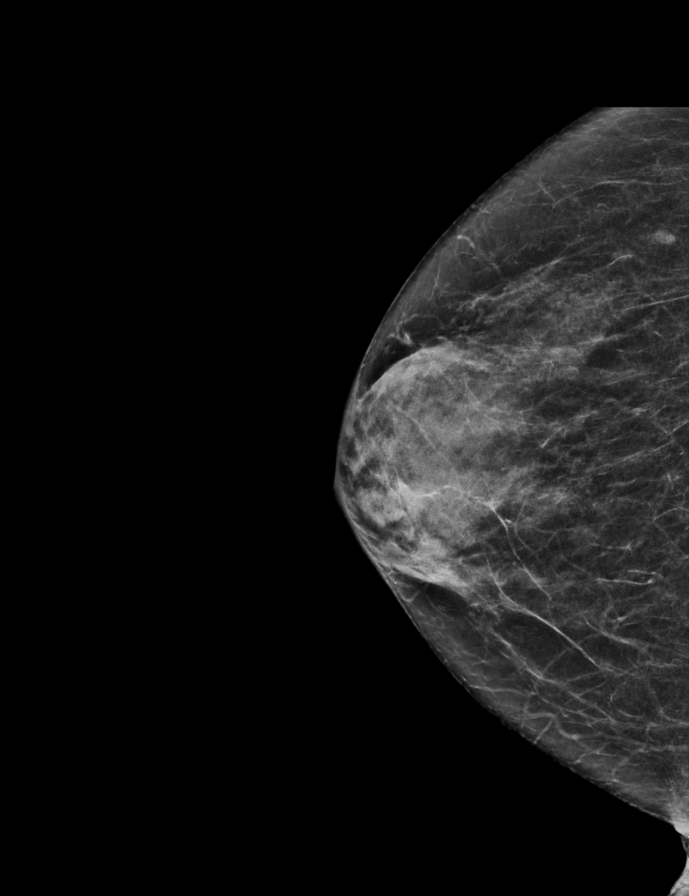

[R MLO synth-2D]
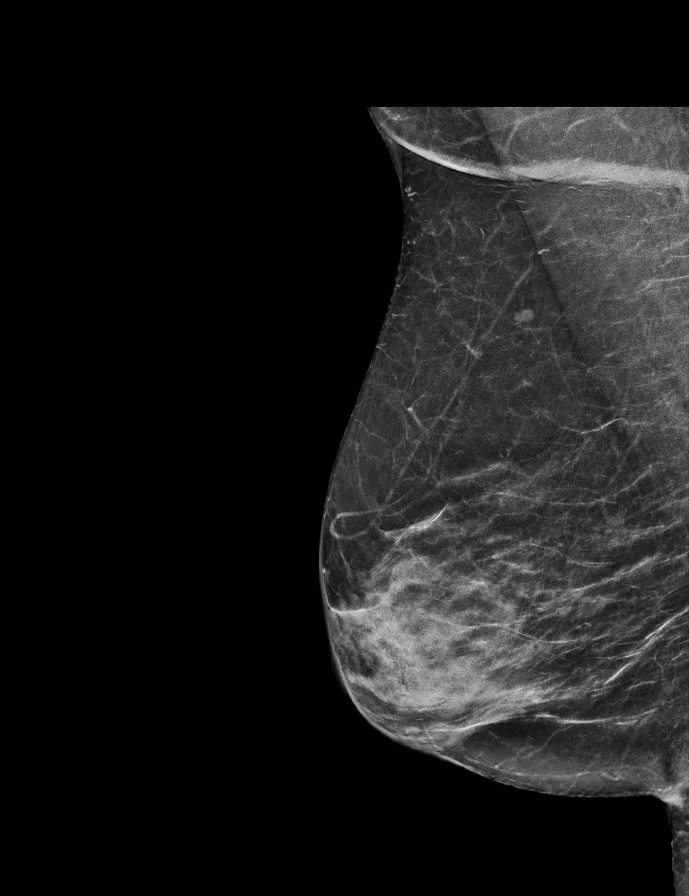

[L CC synth-2D]
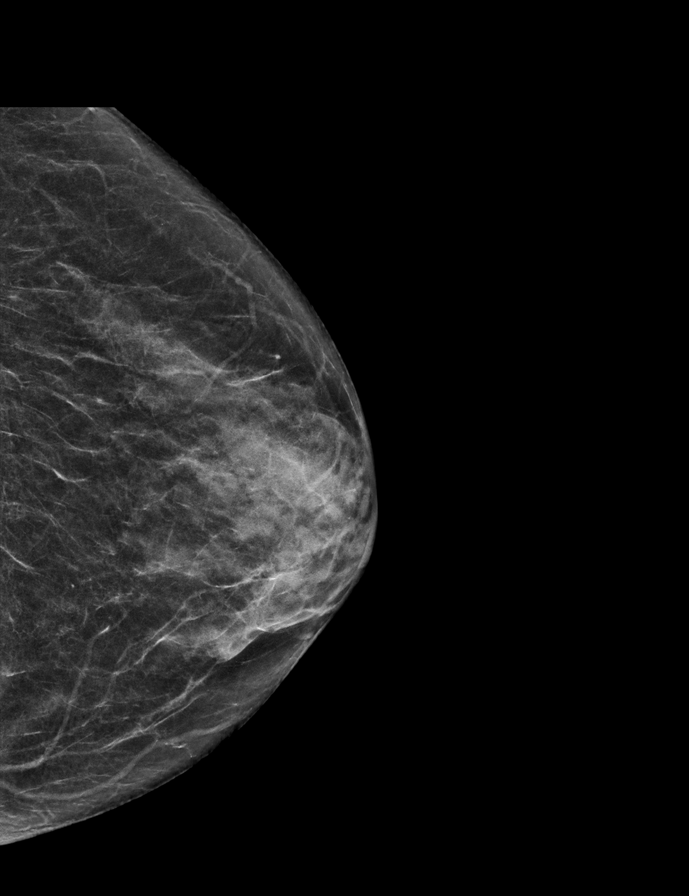

[L CC tomo · 2 of 52 frames shown]
[frame 17/52]
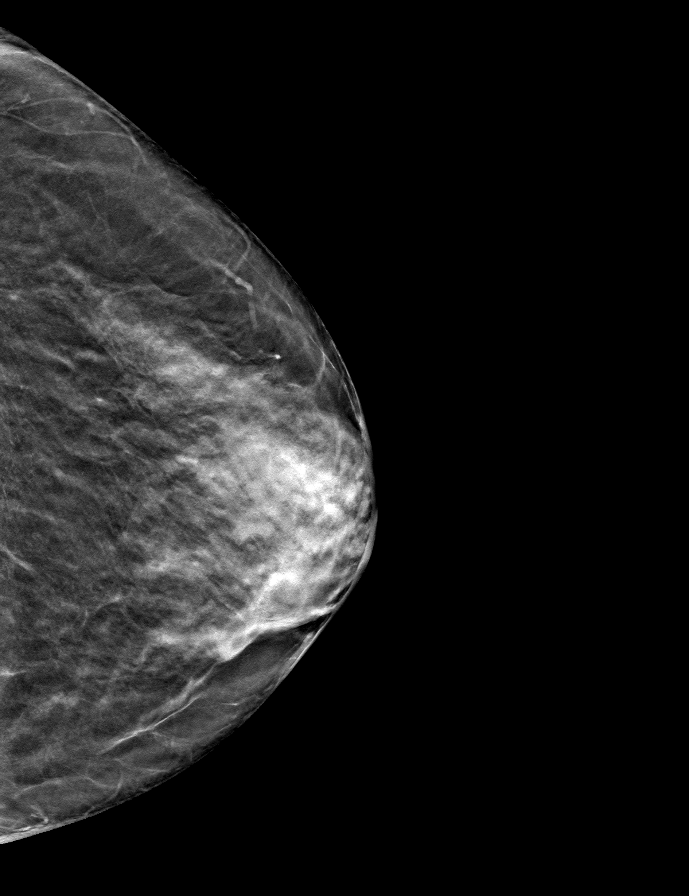
[frame 27/52]
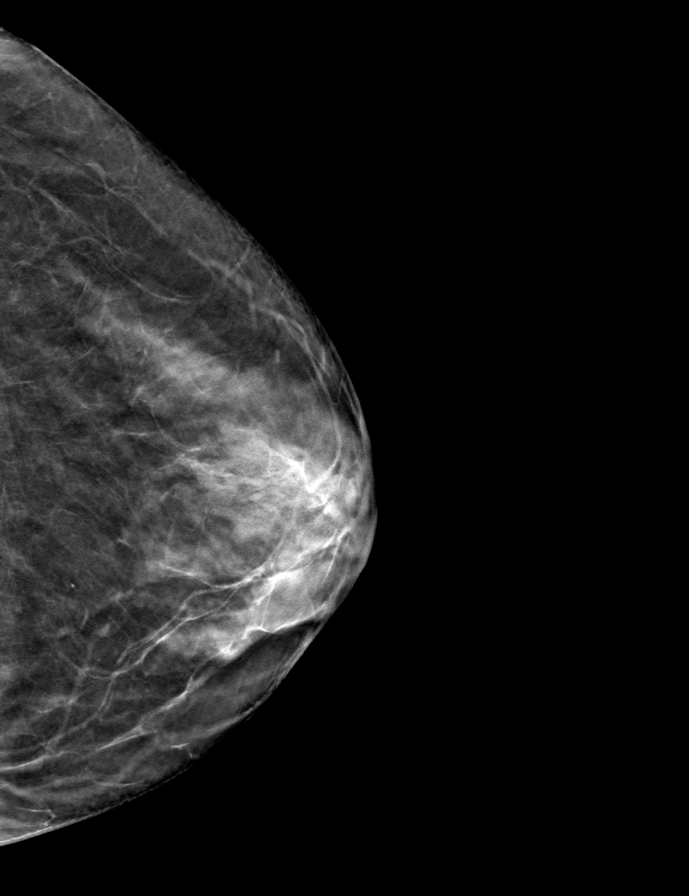

[L MLO tomo · tomo slice 33/65.0]
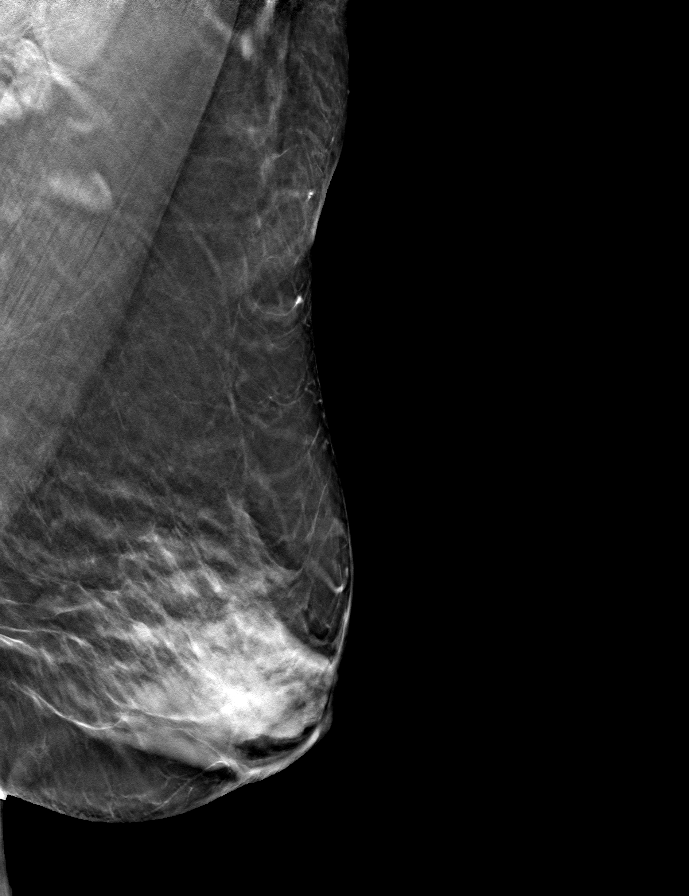

[R MLO tomo · tomo slice 29/56.0]
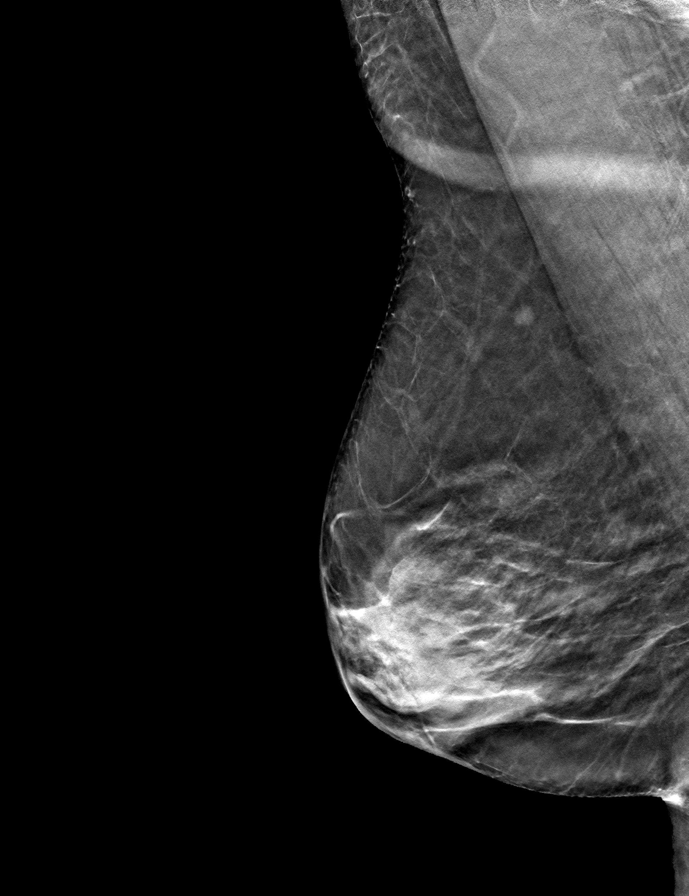

[R CC tomo · tomo slice 27/52.0]
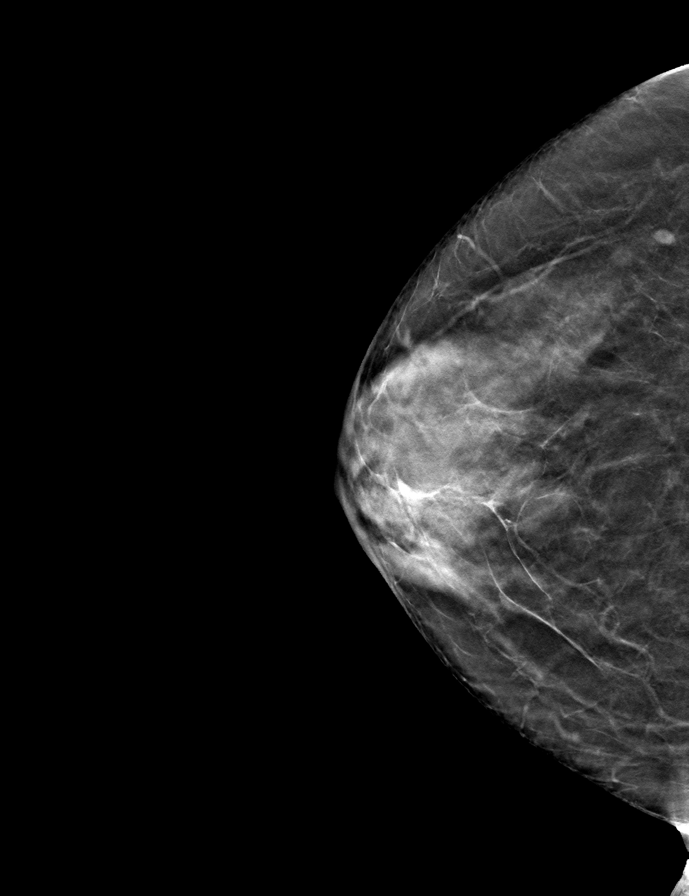

[9 of 24 positions shown; findings below may reference images not displayed]

ACR Breast Density Category c: The breast tissue is heterogeneously
dense, which may obscure small masses.
FINDINGS: In the right breast, possible distortion warrants further
evaluation. In the left breast, no findings suspicious for
malignancy.
IMPRESSION: Further evaluation is suggested for possible distortion in the right
breast.

RECOMMENDATION:
Diagnostic mammogram and possibly ultrasound of the right breast.
(Code:[I7])

The patient will be contacted regarding the findings, and additional
imaging will be scheduled.

BI-RADS CATEGORY  0: Incomplete. Need additional imaging evaluation
and/or prior mammograms for comparison.

## 2021-12-30 ENCOUNTER — Other Ambulatory Visit: Payer: Self-pay | Admitting: Internal Medicine

## 2021-12-30 DIAGNOSIS — R928 Other abnormal and inconclusive findings on diagnostic imaging of breast: Secondary | ICD-10-CM

## 2022-01-05 ENCOUNTER — Ambulatory Visit: Payer: Medicare Other

## 2022-01-05 ENCOUNTER — Ambulatory Visit
Admission: RE | Admit: 2022-01-05 | Discharge: 2022-01-05 | Disposition: A | Discharge status: Home / Self Care | Payer: Medicare Other | Source: Ambulatory Visit | Attending: Internal Medicine | Admitting: Internal Medicine

## 2022-01-05 DIAGNOSIS — R928 Other abnormal and inconclusive findings on diagnostic imaging of breast: Secondary | ICD-10-CM

## 2022-01-05 DIAGNOSIS — R922 Inconclusive mammogram: Secondary | ICD-10-CM | POA: Diagnosis not present

## 2022-01-05 IMAGING — MG MM DIGITAL DIAGNOSTIC UNILAT*R* W/ TOMO W/ CAD
6 series · 6 of 18 positions shown · non-contrast
Comparison: Previous exam(s).

CLINICAL DATA: The patient was called back for possible right
breast distortion.

EXAM:
DIGITAL DIAGNOSTIC UNILATERAL RIGHT MAMMOGRAM WITH TOMOSYNTHESIS AND
CAD
TECHNIQUE: Right digital diagnostic mammography and breast tomosynthesis was
performed. The images were evaluated with computer-aided detection.

[R ML synth-2D]
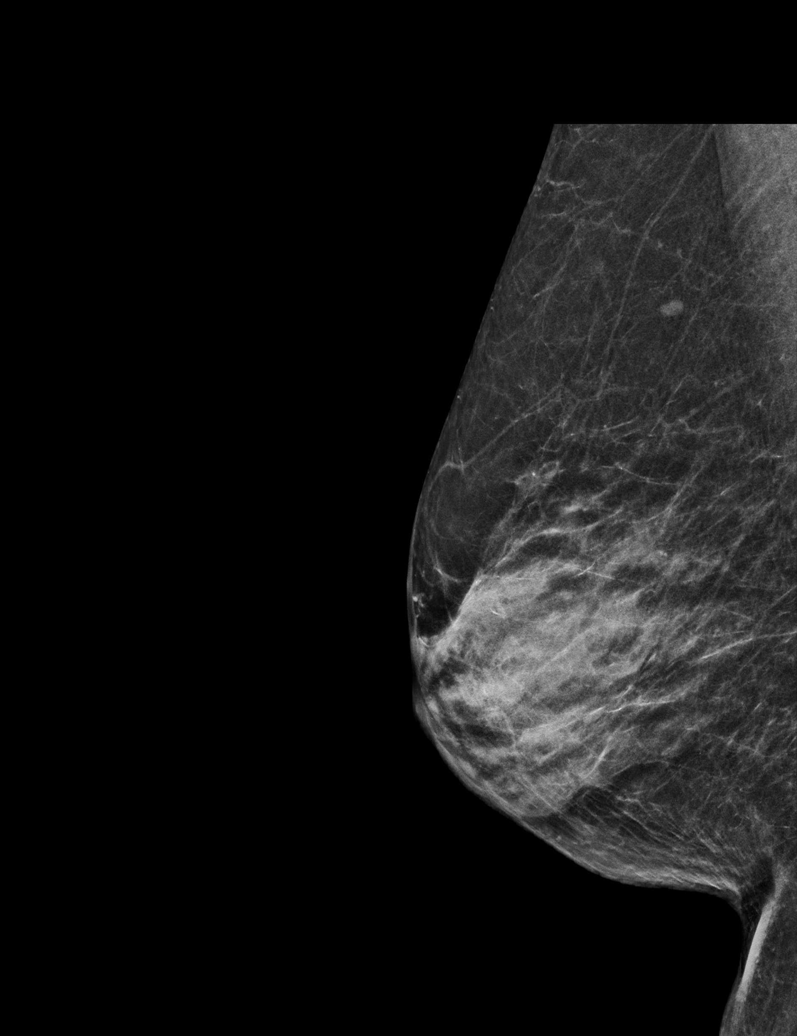

[R CC synth-2D (1 of 2)]
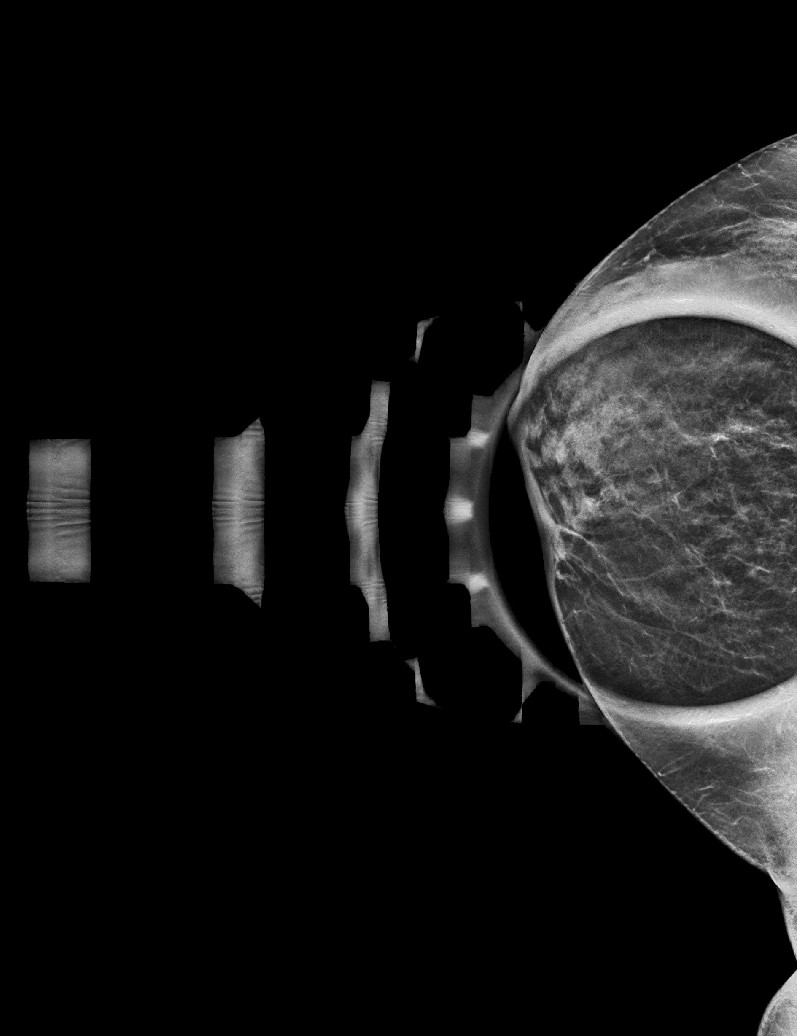

[R CC synth-2D (2 of 2)]
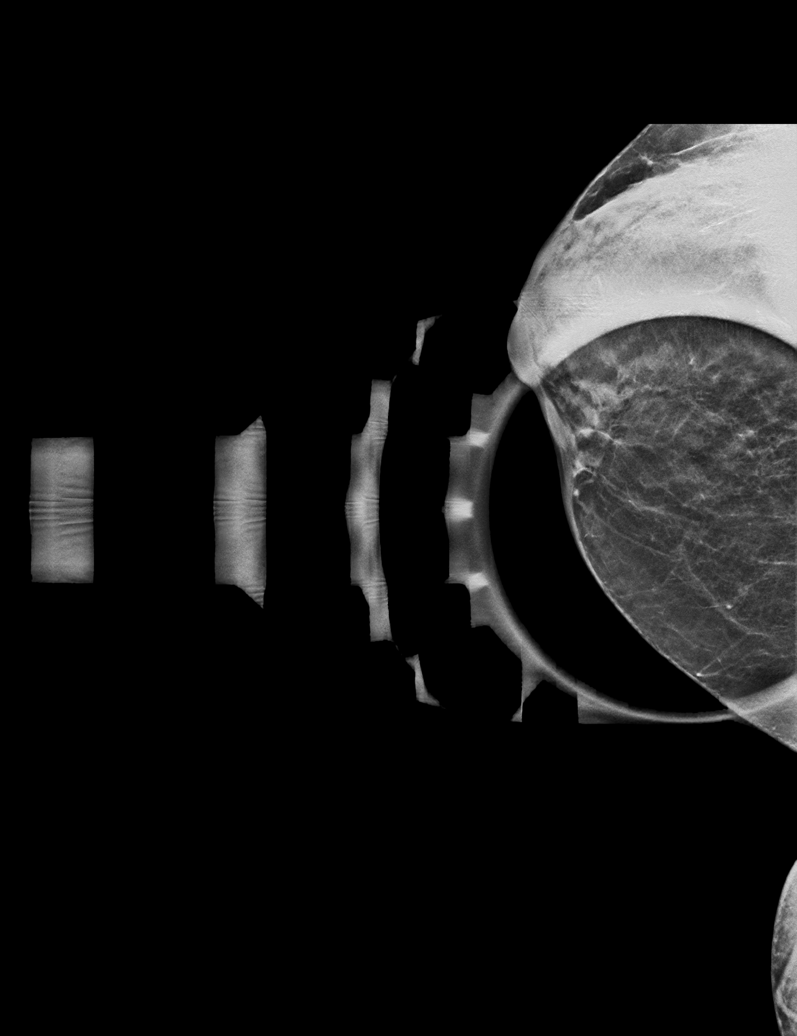

[R CC tomo (1 of 2) · tomo slice 17/32.0]
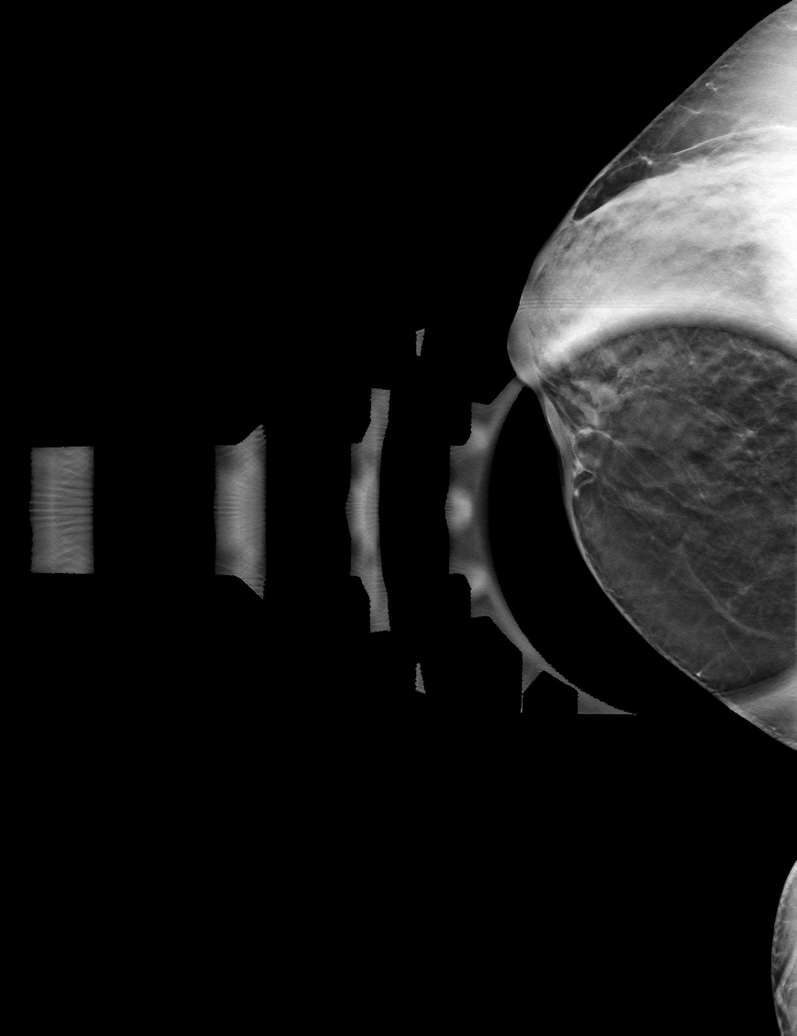

[R CC tomo (2 of 2) · tomo slice 17/34.0]
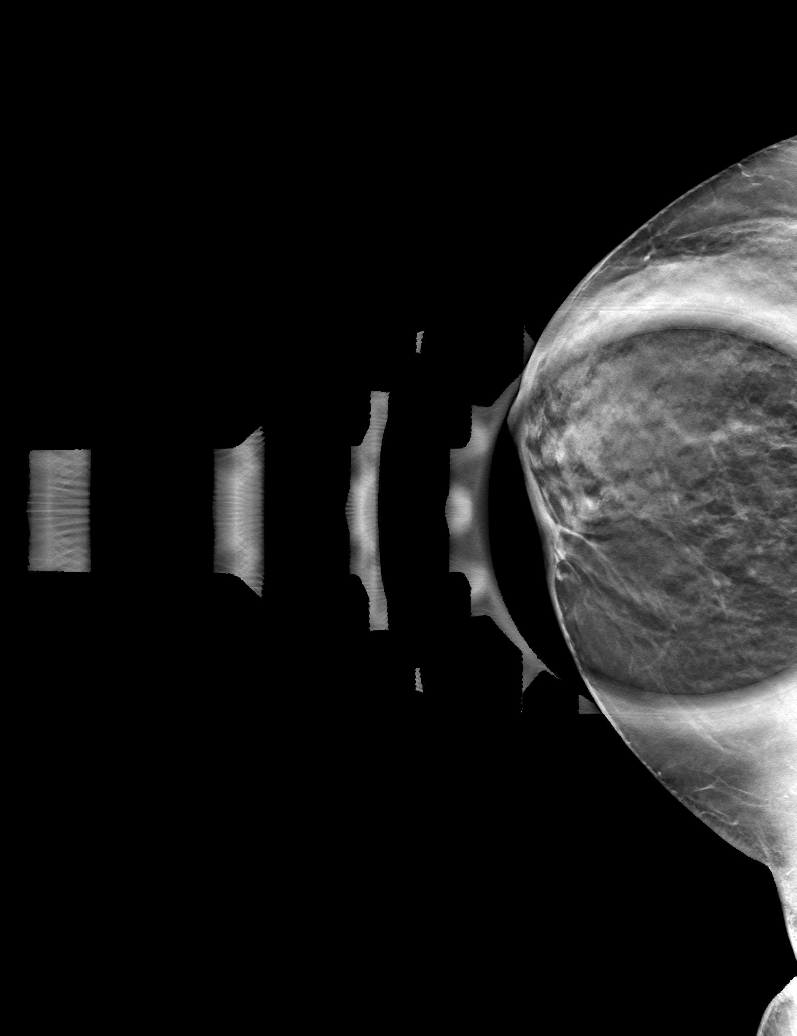

[R ML tomo · tomo slice 25/49.0]
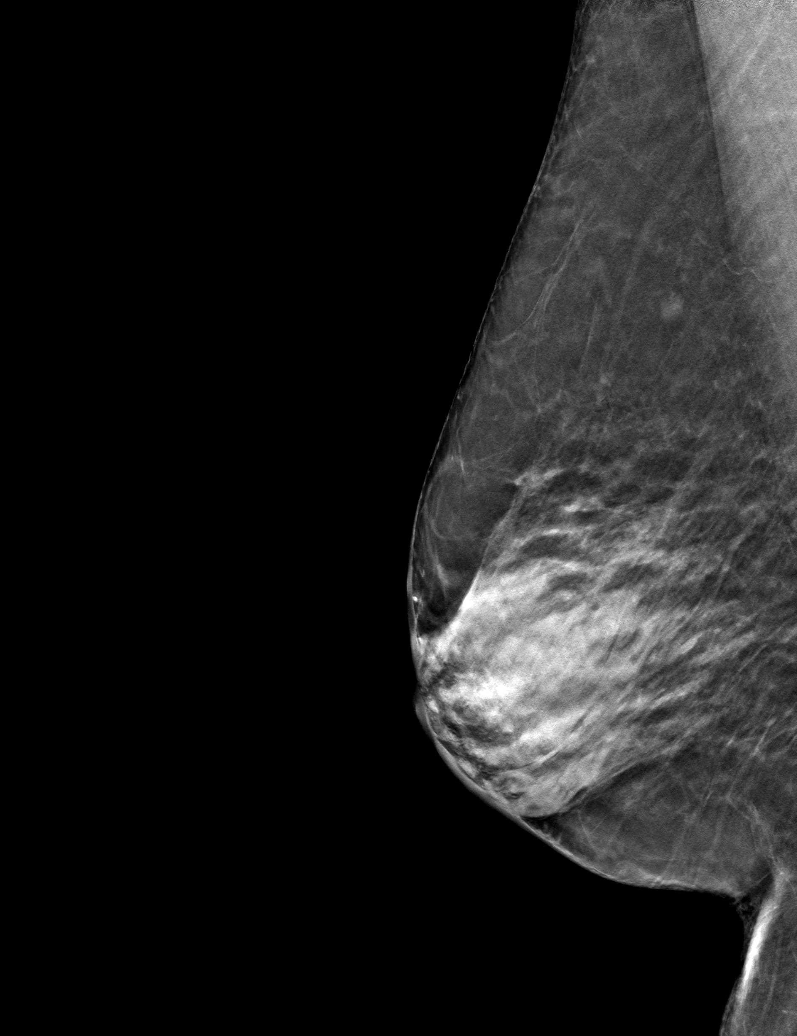

[6 of 18 positions shown; findings below may reference images not displayed]

ACR Breast Density Category c: The breast tissue is heterogeneously
dense, which may obscure small masses.
FINDINGS: The possible right breast distortion resolves on today's imaging.
IMPRESSION: No mammographic evidence of malignancy. Resolution of possible
distortion on the right.

RECOMMENDATION:
Annual screening mammography.

I have discussed the findings and recommendations with the patient.
If applicable, a reminder letter will be sent to the patient
regarding the next appointment.

BI-RADS CATEGORY  1: Negative.

## 2022-01-31 ENCOUNTER — Ambulatory Visit (INDEPENDENT_AMBULATORY_CARE_PROVIDER_SITE_OTHER): Payer: Medicare Other

## 2022-01-31 ENCOUNTER — Encounter: Payer: Self-pay | Admitting: Student

## 2022-01-31 ENCOUNTER — Ambulatory Visit (INDEPENDENT_AMBULATORY_CARE_PROVIDER_SITE_OTHER): Payer: Medicare Other | Admitting: Student

## 2022-01-31 VITALS — BP 132/66 | HR 70 | Temp 97.6°F | Wt 142.9 lb

## 2022-01-31 DIAGNOSIS — Z87891 Personal history of nicotine dependence: Secondary | ICD-10-CM

## 2022-01-31 DIAGNOSIS — R0781 Pleurodynia: Secondary | ICD-10-CM | POA: Diagnosis not present

## 2022-01-31 DIAGNOSIS — I10 Essential (primary) hypertension: Secondary | ICD-10-CM | POA: Diagnosis not present

## 2022-01-31 DIAGNOSIS — Z0000 Encounter for general adult medical examination without abnormal findings: Secondary | ICD-10-CM | POA: Diagnosis not present

## 2022-01-31 MED ORDER — DICLOFENAC SODIUM 1 % EX GEL: 2.0000 g | Freq: Two times a day (BID) | CUTANEOUS | 0 refills | Status: DC | PRN

## 2022-01-31 NOTE — Patient Instructions (Signed)

## 2022-01-31 NOTE — Progress Notes (Signed)
Subjective:   Katherine Haynes is a 71 y.o. female who presents for Medicare Annual (Subsequent) preventive examination. I connected with  Katherine Haynes on 03/30/22 by a  IN PERSON [facility] .    Patient Location: Other:  [place]   Provider Location: Home Office  I discussed the limitations of evaluation and management by telemedicine. The patient expressed understanding and agreed to proceed.    Review of Systems    DEFERRED TO PCP  Cardiac Risk Factors include: advanced age (>10mn, >>78women);dyslipidemia;hypertension     Objective:    Today's Vitals   01/31/22 0934  PainSc: 0-No pain   There is no height or weight on file to calculate BMI.     01/31/2022    9:37 AM 01/31/2022    8:58 AM 12/08/2021    9:02 AM 08/04/2021    9:15 AM 06/23/2021    8:49 AM 02/17/2021   10:57 AM 01/13/2021   10:41 AM  Advanced Directives  Does Patient Have a Medical Advance Directive? No No No No No No No  Would patient like information on creating a medical advance directive? No - Patient declined No - Patient declined No - Patient declined No - Patient declined No - Patient declined No - Patient declined No - Patient declined    Current Medications (verified) Outpatient Encounter Medications as of 01/31/2022  Medication Sig   amLODipine (NORVASC) 10 MG tablet Take 1 tablet (10 mg total) by mouth daily.   atorvastatin (LIPITOR) 40 MG tablet Take 1 tablet (40 mg total) by mouth daily.   diphenhydrAMINE (BENADRYL ALLERGY) 25 mg capsule Please take 50 mg (2 tablets) 1 hour prior to your CT scan.   escitalopram (LEXAPRO) 20 MG tablet Take 1 tablet (20 mg total) by mouth daily.   furosemide (LASIX) 20 MG tablet Take 3 tablets (60 mg total) by mouth daily.   gabapentin (NEURONTIN) 600 MG tablet Take 1 tablet (600 mg total) by mouth at bedtime as needed.   naproxen (NAPROSYN) 500 MG tablet Take 1 tablet (500 mg total) by mouth 2 (two) times daily with a meal.   potassium chloride (KLOR-CON) 10 MEQ  tablet Take 1 tablet (10 mEq total) by mouth daily.   No facility-administered encounter medications on file as of 01/31/2022.    Allergies (verified) Contrast media [iodinated contrast media], Penicillins, and Sulfonamide derivatives   History: Past Medical History:  Diagnosis Date   Hyperlipidemia    Hypertension    Menopause syndrome    Past Surgical History:  Procedure Laterality Date   CHOLECYSTECTOMY N/A 04/05/2017   Procedure: LAPAROSCOPIC CHOLECYSTECTOMY;  Surgeon: Kinsinger, LArta Bruce MD;  Location: [facility];  Service: General;  Laterality: N/A;   MOUTH SURGERY     TONSILLECTOMY     TRANSTHORACIC ECHOCARDIOGRAM  04/19/2019    EF 66 5%.  Mild LVH.  GR 1 DD.  Normal atrial sizes.  Mild aortic valve sclerosis but no stenosis.  Normal IVC.  Otherwise normal valves.   TUBAL LIGATION     Family History  Problem Relation Age of Onset   Heart disease Mother        "enlarged heart"   Hypertension Mother    Diabetes Mother    Heart disease Maternal Uncle        Bypass surg   Heart attack Maternal Grandmother        also had irregular heart beat requriring a pacemaker.   Hypertension Other  In multiple relatives.    Hypertension Paternal 95    COPD Sister    Sarcoidosis Sister    Heart block Sister        s/p PPM   Hypertension Brother    Hypertension Sister    Arthritis Sister    COPD Sister    Diabetes Sister    Hypertension Sister    Arthritis Sister    Hypertension Brother    Healthy Brother    Healthy Brother    Healthy Sister    Breast cancer Neg Hx    Social History   Socioeconomic History   Marital status: Married    Spouse name: Not on file   Number of children: Not on file   Years of education: Not on file   Highest education level: Not on file  Occupational History   Not on file  Tobacco Use   Smoking status: Former   Smokeless tobacco: Never   Tobacco comments:     1 PPD for 2-3 years. Quit ~8546.  Vaping Use   Vaping Use:  Never used  Substance and Sexual Activity   Alcohol use: No   Drug use: No   Sexual activity: Yes    Birth control/protection: Surgical  Other Topics Concern   Not on file  Social History Narrative   Lives with husband. Good relationship, feels safe at home. Studying medical administration.       Current Social History 09/27/2018        Patient lives with spouse in an apartment on the second floor. There are 14 steps with handrails up to the entrance the patient uses.       Patient's method of transportation is personal car.      The highest level of education was high school diploma.      The patient currently is employed as a Charity fundraiser in patient's homes.      Identified important Relationships are "My husband, my kids, my grands and great grands."       Pets : None       Interests / Fun: Watching TV, going to beach and park, cooking out with family.       Current Stressors: "finances, teenagers" (grandkids)       Religious / Personal Beliefs: [employer], "I was raised to treat people the way you want to be treated.          Social Determinants of Health   Financial Resource Strain: Low Risk  (01/31/2022)   Overall Financial Resource Strain (CARDIA)    Difficulty of Paying Living Expenses: Not hard at all  Food Insecurity: No Food Insecurity (01/31/2022)   Hunger Vital Sign    Worried About Running Out of Food in the Last Year: Never true    Ran Out of Food in the Last Year: Never true  Transportation Needs: No Transportation Needs (01/31/2022)   PRAPARE - Hydrologist (Medical): No    Lack of Transportation (Non-Medical): No  Physical Activity: Insufficiently Active (01/31/2022)   Exercise Vital Sign    Days of Exercise per Week: 3 days    Minutes of Exercise per Session: 30 min  Stress: No Stress Concern Present (01/31/2022)   [place]    Feeling of Stress : Not at all   Social Connections: Moderately Integrated (01/31/2022)   Social Connection and Isolation Panel [NHANES]    Frequency of Communication with Friends and Family:  More than three times a week    Frequency of Social Gatherings with Friends and Family: More than three times a week    Attends Religious Services: More than 4 times per year    Active Member of [employer] or Organizations: No    Attends Archivist Meetings: Never    Marital Status: Married    Tobacco Counseling Counseling given: Not Answered Tobacco comments:  1 PPD for 2-3 years. Quit ~2683.   Clinical Intake:  Pre-visit preparation completed: Yes  Pain : No/denies pain Pain Score: 0-No pain     Diabetes: No  How often do you need to have someone help you when you read instructions, pamphlets, or other written materials from your doctor or pharmacy?: 1 - Never What is the last grade level you completed in school?: 12 grade  Diabetic?NO   Interpreter Needed?: No  Information entered by :: Khoa Opdahl   Activities of Daily Living    01/31/2022    9:38 AM 01/31/2022    8:58 AM  In your present state of health, do you have any difficulty performing the following activities:  Hearing? 0 0  Vision? 0 0  Difficulty concentrating or making decisions? 0 0  Walking or climbing stairs? 0 0  Dressing or bathing? 0 0  Doing errands, shopping? 0 0  Preparing Food and eating ? N   Using the Toilet? N   In the past six months, have you accidently leaked urine? N   Do you have problems with loss of bowel control? N   Managing your Medications? N   Managing your Finances? N   Housekeeping or managing your Housekeeping? N     Patient Care Team: Velna Ochs, MD as PCP - General (Internal Medicine) Leonie Man, MD as PCP - Cardiology (Cardiology)  Indicate any recent Medical Services you may have received from other than [facility] providers in the past year (date may be approximate).     Assessment:    This is a routine wellness examination for Katherine Haynes.  Hearing/Vision screen No results found.  Dietary issues and exercise activities discussed: Current Exercise Habits: Home exercise routine, Type of exercise: walking, Time (Minutes): 30, Frequency (Times/Week): 3, Weekly Exercise (Minutes/Week): 90, Intensity: Mild, Exercise limited by: None identified   Goals Addressed   None   Depression Screen    01/31/2022    9:36 AM 01/31/2022    9:00 AM 12/08/2021    9:39 AM 08/04/2021    9:16 AM 06/23/2021    8:49 AM 01/13/2021   10:41 AM 12/16/2020   10:05 AM  PHQ 2/9 Scores  PHQ - 2 Score 0 0 1 2 0 0 0  PHQ- 9 Score    7       Fall Risk    01/31/2022    9:38 AM 01/31/2022    8:57 AM 12/08/2021    9:02 AM 08/04/2021    9:15 AM 06/23/2021    8:49 AM  Fall Risk   Falls in the past year? 0 0 0 0 0  Number falls in past yr: 0 0 0 0 0  Injury with Fall? 0 0 0 0 0  Risk for fall due to : No Fall Risks  No Fall Risks No Fall Risks No Fall Risks  Follow up Falls prevention discussed;Falls evaluation completed Falls evaluation completed Falls evaluation completed;Falls prevention discussed Falls evaluation completed;Falls prevention discussed Falls evaluation completed;Falls prevention discussed    FALL RISK PREVENTION PERTAINING TO THE  HOME:  Any stairs in or around the home? No  If so, are there any without handrails? No  Home free of loose throw rugs in walkways, pet beds, electrical cords, etc? Yes  Adequate lighting in your home to reduce risk of falls? Yes   ASSISTIVE DEVICES UTILIZED TO PREVENT FALLS:  Life alert? No  Use of a cane, walker or w/c? No  Grab bars in the bathroom? Yes  Shower chair or bench in shower? No  Elevated toilet seat or a handicapped toilet? No   TIMED UP AND GO:  Was the test performed? No .  Length of time to ambulate 10 feet: N/A sec.     Cognitive Function:        01/31/2022    9:39 AM  6CIT Screen  What Year? 0 points  What month? 0 points   What time? 0 points  Count back from 20 0 points  Months in reverse 0 points  Repeat phrase 0 points  Total Score 0 points    Immunizations Immunization History  Administered Date(s) Administered   Fluad Quad(high Dose 65+) 04/26/2021   Influenza Whole 05/14/2007, 06/23/2009, 06/23/2010, 03/17/2011   Influenza,inj,Quad PF,6+ Mos 04/25/2017, 08/20/2018, 03/20/2019   PFIZER(Purple Top)SARS-COV-2 Vaccination 09/07/2019, 10/01/2019, 05/30/2020   PPD Test 09/24/2018, 10/06/2020   Pneumococcal Conjugate-13 08/20/2018   Pneumococcal Polysaccharide-23 12/16/2020   Tdap 10/26/2010, 12/08/2021    TDAP status: Up to date  Flu Vaccine status: Up to date  Pneumococcal vaccine status: Up to date  Covid-19 vaccine status: Completed vaccines  Qualifies for Shingles Vaccine? Yes   Zostavax completed No   Shingrix Completed?: No.    Education has been provided regarding the importance of this vaccine. Patient has been advised to call insurance company to determine out of pocket expense if they have not yet received this vaccine. Advised may also receive vaccine at local pharmacy or Health Dept. Verbalized acceptance and understanding.  Screening Tests Health Maintenance  Topic Date Due   Zoster Vaccines- Shingrix (1 of 2) Never done   COVID-19 Vaccine (4 - Pfizer series) 07/25/2020   INFLUENZA VACCINE  02/15/2022   MAMMOGRAM  12/29/2023   TETANUS/TDAP  12/09/2031   Pneumonia Vaccine 20+ Years old  Completed   DEXA SCAN  Completed   Hepatitis C Screening  Completed   HPV VACCINES  Aged Out   COLON CANCER SCREENING ANNUAL FOBT  Discontinued   COLONOSCOPY (Pts 45-55yr Insurance coverage will need to be confirmed)  Discontinued    Health Maintenance  Health Maintenance Due  Topic Date Due   Zoster Vaccines- Shingrix (1 of 2) Never done   COVID-19 Vaccine (4 - Pfizer series) 07/25/2020   INFLUENZA VACCINE  02/15/2022    Colorectal cancer screening: Type of screening: Colonoscopy.  Completed 05/08/2020. Repeat every discontinued  years   MAMMOGRAM completed 12/28/2021 repeat every 2 years .  Bone Density status: Completed 12/18/2018. Results reflect: Bone density results: OSTEOPENIA. Repeat every 2 years.  Lung Cancer Screening: (Low Dose CT Chest recommended if Age 69-80years, 30 pack-year currently smoking OR have quit w/in 15years.) does not qualify.   Lung Cancer Screening Referral: DEFERRED TO PCP   Additional Screening:  Hepatitis C Screening: does qualify; Completed 09/27/2018  Vision Screening: Recommended annual ophthalmology exams for early detection of glaucoma and other disorders of the eye. Is the patient up to date with their annual eye exam?  Yes  Who is the provider or what is the name of the office in  which the patient attends annual eye exams?  [facility]  on [address]  If pt is not established with a provider, would they like to be referred to a provider to establish care? No .   Dental Screening: Recommended annual dental exams for proper oral hygiene  Community Resource Referral / Chronic Care Management: CRR required this visit?  No   CCM required this visit?  No      Plan:     I have personally reviewed and noted the following in the patient's chart:   Medical and social history Use of alcohol, tobacco or illicit drugs  Current medications and supplements including opioid prescriptions.  Functional ability and status Nutritional status Physical activity Advanced directives List of other physicians Hospitalizations, surgeries, and ER visits in previous 12 months Vitals Screenings to include cognitive, depression, and falls Referrals and appointments  In addition, I have reviewed and discussed with patient certain preventive protocols, quality metrics, and best practice recommendations. A written personalized care plan for preventive services as well as general preventive health recommendations were provided to patient.      Katherine Haynes, CMA   03/30/2022   Nurse Notes:  IN PERSON [facility]     Katherine Haynes , Thank you for taking time to come for your Medicare Wellness Visit. I appreciate your ongoing commitment to your health goals. Please review the following plan we discussed and let me know if I can assist you in the future.   These are the goals we discussed:  Goals       Blood Pressure < 140/90      Exercise 3x per week (30 min per time) (pt-stated)      15 minutes twice a day 3 days per week Will also look into restarting water aerobics.      LDL CALC < 130        This is a list of the screening recommended for you and due dates:  Health Maintenance  Topic Date Due   Zoster (Shingles) Vaccine (1 of 2) Never done   COVID-19 Vaccine (4 - Pfizer series) 07/25/2020   Flu Shot  02/15/2022   Mammogram  12/29/2023   Tetanus Vaccine  12/09/2031   Pneumonia Vaccine  Completed   DEXA scan (bone density measurement)  Completed   Hepatitis C Screening: USPSTF Recommendation to screen - Ages 53-79 yo.  Completed   HPV Vaccine  Aged Out   Stool Blood Test  Discontinued   Colon Cancer Screening  Discontinued

## 2022-01-31 NOTE — Assessment & Plan Note (Signed)
Blood pressure well controlled 132/66.  -Continue amlodipine 10 mg -Continue Lasix 20 mg

## 2022-01-31 NOTE — Patient Instructions (Signed)
Ms. Franta,  It was nice seeing you in the clinic today.  Your rib pain is likely secondary to a strain in the muscle or tendon.  I have low suspicion for broken bones.  Please continue Tylenol or naproxen as needed for pain.  Do not take naproxen daily.  I prescribed Voltaren gel which she can applied outside 2 times daily as needed.  You can also use a heat pad as well.  If you have worsening pain or a bony protrusion, please let us know right away.  Please follow-up with Dr. Philipp Ovens around November 2023.  Take care  Dr. Alfonse Spruce

## 2022-01-31 NOTE — Assessment & Plan Note (Signed)
Patient endorses 2 weeks of anterior right rib pain.  Said that 2 weeks ago she was cleaning and washing her car, which she leaned her right rib cage onto a hard object.  Said that she heard a pop sound and developed pain on the right side.  Said the pain has improved but never went away completely.  Pain is worse with coughing and certain movement.  Pain is not triggered with food or urination.  She is taking naproxen and Tylenol which helped ease the pain.  States the pain is does not affect her daily activity.  Physical exam revealed mild tenderness to palpation in the right anterior costal cartilage of T9-T10 without guarding.  There was no hematoma or bruises visualized.  No bony protrusion.  No CVA tenderness bilaterally.  Suspect this is a ligamental strain or muscle strain.  Low suspicion for fracture with no severe trauma.  Advised patient to take Tylenol and naproxen as needed for pain.  Advised patient to not use naproxen excessively. Prescribed Voltaren gel topically.  She can also use heat pad as needed as well.  Patient will contact us if pain suddenly worsens or if she notices any bony protrusion.

## 2022-01-31 NOTE — Progress Notes (Signed)
   CC: Right anterior rib pain  HPI:  [name] is a 71 y.o. with past medical history of hypertension, hyperlipidemia, lymphedema who presents to the clinic for evaluation of her right anterior rib pain.  Please see problem based charting for detail  Past Medical History:  Diagnosis Date   Hyperlipidemia    Hypertension    Menopause syndrome    Review of Systems:  per HPI  Physical Exam:  Vitals:   01/31/22 0849  BP: 132/66  Pulse: 70  Temp: 97.6 F (36.4 C)  TempSrc: Oral  SpO2: 100%  Weight: 142 lb 14.4 oz (64.8 kg)   Physical Exam Constitutional:      General: She is not in acute distress.    Appearance: She is not ill-appearing.  HENT:     Head: Normocephalic.  Eyes:     General:        Right eye: No discharge.        Left eye: No discharge.     Conjunctiva/sclera: Conjunctivae normal.  Cardiovascular:     Rate and Rhythm: Normal rate and regular rhythm.  Pulmonary:     Effort: Pulmonary effort is normal. No respiratory distress.     Breath sounds: Normal breath sounds. No wheezing.  Abdominal:     General: Bowel sounds are normal. There is no distension.     Palpations: Abdomen is soft.     Tenderness: There is no abdominal tenderness. There is no right CVA tenderness or left CVA tenderness.  Musculoskeletal:     Comments: Mild tenderness to palpation in the right anterior costal cartilage of T9-T10, no guarding.  No change in overlying skin, no hematoma or bruises.  No bony protrusion.  No CVA tenderness bilaterally.  Skin:    General: Skin is warm.  Neurological:     Mental Status: She is alert and oriented to person, place, and time.  Psychiatric:        Mood and Affect: Mood normal.      Assessment & Plan:   See Encounters Tab for problem based charting.  Rib pain on right side Patient endorses 2 weeks of anterior right rib pain.  Said that 2 weeks ago she was cleaning and washing her car, which she leaned her right rib cage onto a  hard object.  Said that she heard a pop sound and developed pain on the right side.  Said the pain has improved but never went away completely.  Pain is worse with coughing and certain movement.  Pain is not triggered with food or urination.  She is taking naproxen and Tylenol which helped ease the pain.  States the pain is does not affect her daily activity.  Physical exam revealed mild tenderness to palpation in the right anterior costal cartilage of T9-T10 without guarding.  There was no hematoma or bruises visualized.  No bony protrusion.  No CVA tenderness bilaterally.  Suspect this is a ligamental strain or muscle strain.  Low suspicion for fracture with no severe trauma.  Advised patient to take Tylenol and naproxen as needed for pain.  Advised patient to not use naproxen excessively. Prescribed Voltaren gel topically.  She can also use heat pad as needed as well.  Patient will contact us if pain suddenly worsens or if she notices any bony protrusion.  Essential hypertension Blood pressure well controlled 132/66.  -Continue amlodipine 10 mg -Continue Lasix 20 mg   Patient discussed with Dr. Heber Gargatha

## 2022-01-31 NOTE — Progress Notes (Signed)
Internal Medicine Clinic Attending  Case discussed with the resident at the time of the visit.  We reviewed the resident's history and exam and pertinent patient test results.  I agree with the assessment, diagnosis, and plan of care documented in the resident's note.  

## 2022-02-14 ENCOUNTER — Other Ambulatory Visit: Payer: Self-pay | Admitting: Internal Medicine

## 2022-02-14 DIAGNOSIS — F321 Major depressive disorder, single episode, moderate: Secondary | ICD-10-CM

## 2022-02-14 NOTE — Telephone Encounter (Signed)
I increased her dose to 20 mg at her last visit. Can you clarify which dose she is taking? Thank you

## 2022-04-28 ENCOUNTER — Other Ambulatory Visit: Payer: Self-pay

## 2022-04-28 DIAGNOSIS — E876 Hypokalemia: Secondary | ICD-10-CM

## 2022-04-28 MED ORDER — POTASSIUM CHLORIDE ER 10 MEQ PO TBCR: 10.0000 meq | EXTENDED_RELEASE_TABLET | Freq: Every day | ORAL | 3 refills | Status: DC

## 2022-06-01 ENCOUNTER — Telehealth: Payer: Self-pay

## 2022-06-01 ENCOUNTER — Encounter: Payer: Self-pay | Admitting: Internal Medicine

## 2022-06-01 ENCOUNTER — Ambulatory Visit (INDEPENDENT_AMBULATORY_CARE_PROVIDER_SITE_OTHER): Payer: Medicare Other | Admitting: Internal Medicine

## 2022-06-01 ENCOUNTER — Other Ambulatory Visit: Payer: Self-pay

## 2022-06-01 VITALS — BP 119/64 | HR 71 | Temp 98.1°F | Resp 24 | Ht 66.0 in | Wt 142.2 lb

## 2022-06-01 DIAGNOSIS — M5412 Radiculopathy, cervical region: Secondary | ICD-10-CM

## 2022-06-01 DIAGNOSIS — Z87891 Personal history of nicotine dependence: Secondary | ICD-10-CM

## 2022-06-01 DIAGNOSIS — I10 Essential (primary) hypertension: Secondary | ICD-10-CM

## 2022-06-01 DIAGNOSIS — G959 Disease of spinal cord, unspecified: Secondary | ICD-10-CM | POA: Diagnosis not present

## 2022-06-01 DIAGNOSIS — F321 Major depressive disorder, single episode, moderate: Secondary | ICD-10-CM | POA: Diagnosis not present

## 2022-06-01 DIAGNOSIS — M533 Sacrococcygeal disorders, not elsewhere classified: Secondary | ICD-10-CM | POA: Diagnosis not present

## 2022-06-01 MED ORDER — DICLOFENAC SODIUM 1 % EX GEL: 4.0000 g | Freq: Four times a day (QID) | CUTANEOUS | 1 refills | Status: DC

## 2022-06-01 MED ORDER — METHOCARBAMOL 750 MG PO TABS: 1500.0000 mg | ORAL_TABLET | Freq: Three times a day (TID) | ORAL | 1 refills | Status: DC | PRN

## 2022-06-01 NOTE — Patient Instructions (Addendum)
Katherine Haynes,  It was a pleasure to see you. Please continue to take all of your mediations as previousy prescribed.   For your low back / hip pain, I think this is coming from your sacroiliac joint (SI joint). I have given you a prescription for a muscle relaxer. You can take up to 2 tablets every 8 hours as needed. I have also given you a prescription for an anti inflammatory gel that you can apply to the area. Please do not take more than 2 naproxen a day. I will call you with the results of your blood work.   If you have any questions or concerns, call our clinic at [phone] or after hours call [phone] and ask for the internal medicine resident on call.   Thank you!  Dr. Darnell Level

## 2022-06-01 NOTE — Progress Notes (Unsigned)
Subjective:   Patient ID: Katherine Haynes female   DOB: 12-30-1950 71 y.o.   MRN: [id number]  HPI: Katherine Haynes is a 71 y.o. female with past medical history outlined below here for follow up of HTN and with acute complaint of back pain. For the details of today's visit, please refer to the assessment and plan below.  Past Medical History:  Diagnosis Date   Hyperlipidemia    Hypertension    Menopause syndrome    Current Outpatient Medications  Medication Sig Dispense Refill   diclofenac Sodium (VOLTAREN) 1 % GEL Apply 4 g topically 4 (four) times daily. 150 g 1   methocarbamol (ROBAXIN) 750 MG tablet Take 2 tablets (1,500 mg total) by mouth every 8 (eight) hours as needed for muscle spasms. 180 tablet 1   amLODipine (NORVASC) 10 MG tablet Take 1 tablet (10 mg total) by mouth daily. 90 tablet 3   atorvastatin (LIPITOR) 40 MG tablet Take 1 tablet (40 mg total) by mouth daily. 90 tablet 3   diclofenac Sodium (VOLTAREN ARTHRITIS PAIN) 1 % GEL Apply 2 g topically 2 (two) times daily as needed. 100 g 0   diphenhydrAMINE (BENADRYL ALLERGY) 25 mg capsule Please take 50 mg (2 tablets) 1 hour prior to your CT scan. 30 capsule 0   escitalopram (LEXAPRO) 20 MG tablet Take 1 tablet (20 mg total) by mouth daily. 90 tablet 3   furosemide (LASIX) 20 MG tablet Take 3 tablets (60 mg total) by mouth daily. 270 tablet 3   gabapentin (NEURONTIN) 600 MG tablet Take 1 tablet (600 mg total) by mouth at bedtime as needed. 90 tablet 3   naproxen (NAPROSYN) 500 MG tablet Take 1 tablet (500 mg total) by mouth 2 (two) times daily with a meal. 60 tablet 2   potassium chloride (KLOR-CON) 10 MEQ tablet Take 1 tablet (10 mEq total) by mouth daily. 90 tablet 3   No current facility-administered medications for this visit.   Family History  Problem Relation Age of Onset   Heart disease Mother        "enlarged heart"   Hypertension Mother    Diabetes Mother    Heart disease Maternal Uncle        Bypass surg    Heart attack Maternal Grandmother        also had irregular heart beat requriring a pacemaker.   Hypertension Other        In multiple relatives.    Hypertension Paternal Grandmother    COPD Sister    Sarcoidosis Sister    Heart block Sister        s/p PPM   Hypertension Brother    Hypertension Sister    Arthritis Sister    COPD Sister    Diabetes Sister    Hypertension Sister    Arthritis Sister    Hypertension Brother    Healthy Brother    Healthy Brother    Healthy Sister    Breast cancer Neg Hx    Social History   Socioeconomic History   Marital status: Married    Spouse name: Not on file   Number of children: Not on file   Years of education: Not on file   Highest education level: Not on file  Occupational History   Not on file  Tobacco Use   Smoking status: Former   Smokeless tobacco: Never   Tobacco comments:     1 PPD for 2-3 years. Quit ~0865.  Vaping Use  Vaping Use: Never used  Substance and Sexual Activity   Alcohol use: No   Drug use: No   Sexual activity: Yes    Birth control/protection: Surgical  Other Topics Concern   Not on file  Social History Narrative   Lives with husband. Good relationship, feels safe at home. Studying medical administration.       Current Social History 09/27/2018        Patient lives with spouse in an apartment on the second floor. There are 14 steps with handrails up to the entrance the patient uses.       Patient's method of transportation is personal car.      The highest level of education was high school diploma.      The patient currently is employed as a Engineer, site in patient's homes.      Identified important Relationships are "My husband, my kids, my grands and great grands."       Pets : None       Interests / Fun: Watching TV, going to beach and park, cooking out with family.       Current Stressors: "finances, teenagers" (grandkids)       Religious / Personal Beliefs: [employer], "I was raised  to treat people the way you want to be treated.          Social Determinants of Health   Financial Resource Strain: Low Risk  (01/31/2022)   Overall Financial Resource Strain (CARDIA)    Difficulty of Paying Living Expenses: Not hard at all  Food Insecurity: No Food Insecurity (01/31/2022)   Hunger Vital Sign    Worried About Running Out of Food in the Last Year: Never true    Ran Out of Food in the Last Year: Never true  Transportation Needs: No Transportation Needs (01/31/2022)   PRAPARE - Administrator, Civil Service (Medical): No    Lack of Transportation (Non-Medical): No  Physical Activity: Insufficiently Active (01/31/2022)   Exercise Vital Sign    Days of Exercise per Week: 3 days    Minutes of Exercise per Session: 30 min  Stress: No Stress Concern Present (01/31/2022)   [employer] of Occupational Health - Occupational Stress Questionnaire    Feeling of Stress : Not at all  Social Connections: Moderately Integrated (01/31/2022)   Social Connection and Isolation Panel [NHANES]    Frequency of Communication with Friends and Family: More than three times a week    Frequency of Social Gatherings with Friends and Family: More than three times a week    Attends Religious Services: More than 4 times per year    Active Member of [employer] or Organizations: No    Attends Banker Meetings: Never    Marital Status: Married    Objective:  Physical Exam:  Vitals:   06/01/22 1044  BP: 119/64  Pulse: 71  Resp: (!) 24  Temp: 98.1 F (36.7 C)  TempSrc: Oral  SpO2: 100%  Weight: 142 lb 3.2 oz (64.5 kg)  Height: 5\' 6"  (1.676 m)    Constitutional: NAD, well appearing  Cardiovascular: RRR, no m/r/g Pulmonary/Chest: Clear bilaterally normal effort Extremities: Warm, Non pitting edema bilaterally  Assessment & Plan:   SI (sacroiliac) joint dysfunction Patient has acute pain over her left SI joint. No reg flags, feels she has some pain that radiates  into her buttocks. She has been taking naproxen 500 mg TID. Given this is acute, we discussed conservative measures. Recommended heating  pads, topical voltaren gel, and gave rx for robaxin PRN. Advised her to decrease naproxen to 500 mg BID which is the max dose. If symptoms do not improve over the next few weeks, will consider imaging and referral to PT or IR for SI joint injection.   Essential hypertension Chronic and well controlled on amlodipine 10 mg and lasix 20 mg daily (for lymphedema). Checking BMP today.   Cervical myelopathy with cervical radiculopathy [facility]) Patient is complaining of new intermittent numbness over her left scapula. She has known cervical disc disease s/p anterior cervical discectomy and fusion of C3-C5 on 02/02/21. She denies pain and weakness. She is agreeable to monitoring symptoms for now as they are not overly bothersome and she is not eager for further surgery. If numbness persists or she develops pain or weakness, will consider repeat imaging of her C-spine with thoracic imaging as well. She would be open to a facet joint injection or an epidural with IR.   Major depression Mood as improved on lexapro 20 mg daily. Sent refills.

## 2022-06-01 NOTE — Patient Outreach (Signed)
Care Coordination   In Person Provider Office Visit Note   06/01/2022 Name: Katherine Haynes MRN: [id number] DOB: 1950/09/19  Katherine Haynes is a 71 y.o. year old female who sees Reymundo Poll, MD for primary care. I engaged with Katherine Haynes in the providers office today.  What matters to the patients health and wellness today?  Care coordination services presented to patient today.  She shared she does not have any needs at this time but took contact information ans will call if any needs come up.    Goals Addressed               This Visit's Progress     COMPLETED: "I do not have any needs that I can think of for care coordination" (pt-stated)        Care Coordination Interventions: Discussed plans with patient for ongoing care management follow up and provided patient with direct contact information for care management team Assessed social determinant of health barriers          SDOH assessments and interventions completed:  Yes  SDOH Interventions Today    Flowsheet Row Most Recent Value  SDOH Interventions   Housing Interventions Intervention Not Indicated  Utilities Interventions Intervention Not Indicated        Care Coordination Interventions Activated:  Yes  Care Coordination Interventions:  Yes, provided   Follow up plan: No further intervention required.   Encounter Outcome:  Pt. Visit Completed

## 2022-06-02 ENCOUNTER — Encounter: Payer: Self-pay | Admitting: Internal Medicine

## 2022-06-02 LAB — CMP14 + ANION GAP
ALT: 51 IU/L — ABNORMAL HIGH (ref 0–32)
AST: 65 IU/L — ABNORMAL HIGH (ref 0–40)
Albumin/Globulin Ratio: 1.3 (ref 1.2–2.2)
Albumin: 4.2 g/dL (ref 3.9–4.9)
Alkaline Phosphatase: 115 IU/L (ref 44–121)
Anion Gap: 13 mmol/L (ref 10.0–18.0)
BUN/Creatinine Ratio: 13 (ref 12–28)
BUN: 12 mg/dL (ref 8–27)
Bilirubin Total: 0.5 mg/dL (ref 0.0–1.2)
CO2: 28 mmol/L (ref 20–29)
Calcium: 9.3 mg/dL (ref 8.7–10.3)
Chloride: 99 mmol/L (ref 96–106)
Creatinine, Ser: 0.95 mg/dL (ref 0.57–1.00)
Globulin, Total: 3.3 g/dL (ref 1.5–4.5)
Glucose: 98 mg/dL (ref 70–99)
Potassium: 4.4 mmol/L (ref 3.5–5.2)
Sodium: 140 mmol/L (ref 134–144)
Total Protein: 7.5 g/dL (ref 6.0–8.5)
eGFR: 65 mL/min/{1.73_m2} (ref >59)

## 2022-06-02 NOTE — Assessment & Plan Note (Signed)
Chronic and well controlled on amlodipine 10 mg and lasix 20 mg daily (for lymphedema). Checking BMP today.

## 2022-06-02 NOTE — Assessment & Plan Note (Signed)
Patient has acute pain over her left SI joint. No reg flags, feels she has some pain that radiates into her buttocks. She has been taking naproxen 500 mg TID. Given this is acute, we discussed conservative measures. Recommended heating pads, topical voltaren gel, and gave rx for robaxin PRN. Advised her to decrease naproxen to 500 mg BID which is the max dose. If symptoms do not improve over the next few weeks, will consider imaging and referral to PT or IR for SI joint injection.

## 2022-06-02 NOTE — Assessment & Plan Note (Signed)
Patient is complaining of new intermittent numbness over her left scapula. She has known cervical disc disease s/p anterior cervical discectomy and fusion of C3-C5 on 02/02/21. She denies pain and weakness. She is agreeable to monitoring symptoms for now as they are not overly bothersome and she is not eager for further surgery. If numbness persists or she develops pain or weakness, will consider repeat imaging of her C-spine with thoracic imaging as well. She would be open to a facet joint injection or an epidural with IR.

## 2022-06-02 NOTE — Assessment & Plan Note (Signed)
Mood as improved on lexapro 20 mg daily. Sent refills.

## 2022-06-03 ENCOUNTER — Encounter: Payer: Self-pay | Admitting: Internal Medicine

## 2022-06-15 ENCOUNTER — Encounter: Payer: Medicare Other | Admitting: Internal Medicine

## 2022-07-29 ENCOUNTER — Encounter: Payer: Self-pay | Admitting: Internal Medicine

## 2022-07-29 DIAGNOSIS — M25552 Pain in left hip: Secondary | ICD-10-CM

## 2022-07-29 DIAGNOSIS — M25512 Pain in left shoulder: Secondary | ICD-10-CM

## 2022-08-03 NOTE — Addendum Note (Signed)
Addended by: Jodean Lima on: 08/03/2022 08:20 AM   Modules accepted: Orders

## 2022-08-04 ENCOUNTER — Other Ambulatory Visit: Payer: Self-pay | Admitting: Internal Medicine

## 2022-08-04 DIAGNOSIS — M533 Sacrococcygeal disorders, not elsewhere classified: Secondary | ICD-10-CM

## 2022-08-05 ENCOUNTER — Other Ambulatory Visit: Payer: Self-pay | Admitting: Internal Medicine

## 2022-08-05 ENCOUNTER — Ambulatory Visit (HOSPITAL_COMMUNITY)
Admission: RE | Admit: 2022-08-05 | Discharge: 2022-08-05 | Disposition: A | Discharge status: Home / Self Care | Payer: Medicare Other | Source: Ambulatory Visit | Attending: Internal Medicine | Admitting: Internal Medicine

## 2022-08-05 DIAGNOSIS — M19012 Primary osteoarthritis, left shoulder: Secondary | ICD-10-CM | POA: Diagnosis not present

## 2022-08-05 DIAGNOSIS — M25552 Pain in left hip: Secondary | ICD-10-CM | POA: Diagnosis not present

## 2022-08-05 DIAGNOSIS — M25512 Pain in left shoulder: Secondary | ICD-10-CM | POA: Diagnosis not present

## 2022-08-08 ENCOUNTER — Other Ambulatory Visit: Payer: Self-pay | Admitting: Internal Medicine

## 2022-08-08 DIAGNOSIS — M25552 Pain in left hip: Secondary | ICD-10-CM

## 2022-08-08 DIAGNOSIS — M25512 Pain in left shoulder: Secondary | ICD-10-CM

## 2022-08-08 DIAGNOSIS — M533 Sacrococcygeal disorders, not elsewhere classified: Secondary | ICD-10-CM

## 2022-08-08 MED ORDER — METHOCARBAMOL 750 MG PO TABS: 1500.0000 mg | ORAL_TABLET | Freq: Three times a day (TID) | ORAL | 1 refills | Status: DC | PRN

## 2022-08-17 NOTE — Progress Notes (Signed)
Referral placed.

## 2022-08-17 NOTE — Addendum Note (Signed)
Addended by: Jodean Lima on: 08/17/2022 08:21 AM   Modules accepted: Orders

## 2022-08-18 ENCOUNTER — Encounter: Payer: Self-pay | Admitting: Orthopedic Surgery

## 2022-08-18 ENCOUNTER — Ambulatory Visit (INDEPENDENT_AMBULATORY_CARE_PROVIDER_SITE_OTHER): Payer: Medicare Other | Admitting: Orthopedic Surgery

## 2022-08-18 ENCOUNTER — Ambulatory Visit: Payer: Self-pay

## 2022-08-18 ENCOUNTER — Ambulatory Visit (INDEPENDENT_AMBULATORY_CARE_PROVIDER_SITE_OTHER): Payer: Medicare Other

## 2022-08-18 DIAGNOSIS — M79605 Pain in left leg: Secondary | ICD-10-CM | POA: Diagnosis not present

## 2022-08-18 DIAGNOSIS — M19012 Primary osteoarthritis, left shoulder: Secondary | ICD-10-CM

## 2022-08-18 NOTE — Progress Notes (Signed)
Office Visit Note   Patient: Katherine Haynes           Date of Birth: 09/17/1950           MRN: [id number] Visit Date: 08/18/2022 Requested by: Reymundo Poll, MD [address] [place],  [place] [address] PCP: Reymundo Poll, MD  Subjective: Chief Complaint  Patient presents with   Left Shoulder - Pain   Left Hip - Pain    HPI: Katherine Haynes is a 72 y.o. female who presents to the office reporting left hip and buttock pain.  The pain does radiate down the leg.  Goes down to the knee but not below the knee.  Denies any numbness and tingling.  Does report some low back pain but no groin pain.  Has pain with prolonged walking and sitting.  The pain does wake her from sleep at night.  She denies any right-sided symptoms.  This pain does limit her walking endurance.  She tries to work through it.  Hurts her some to walk.  Patient also reports in addition to several month history of left hip and buttock pain several month history of left shoulder pain.  She reports decreased range of motion but no neck pain.  The pain radiates down her biceps.  Reports some occasional tingling in her finger as well as scapular pain.  Hard for her to do her hair or anyone's hair for very long.  She feels popping sometimes in that left shoulder.  Hard for her to get behind her back.  Describes some tingling in the trapezial region as well.  She does have teenage grandkids.  She has had prior C-spine fusion 2 years ago.  She is retired.  She has tried muscle relaxer and naproxen with some relief..                ROS: All systems reviewed are negative as they relate to the chief complaint within the history of present illness.  Patient denies fevers or chills.  Assessment & Plan: Visit Diagnoses: No diagnosis found.  Plan: Impression is left hip and buttock pain.  I think this looks like radiculopathy.  She does have some degenerative changes on lumbar spine radiographs.  Hip examination is normal.  Symptoms  ongoing for longer than 6 weeks with failure of conservative management.  The left shoulder looks to be more consistent with possible arthritis and/or labral pathology.  Plan ultrasound-guided glenohumeral joint injection for that and we will see how she does with that injection when she comes back to review her MRI scan of the lumbar spine.  Anticipate ESI's after the scan.  Follow-Up Instructions: No follow-ups on file.   Orders:  No orders of the defined types were placed in this encounter.  No orders of the defined types were placed in this encounter.     Procedures: Large Joint Inj: L glenohumeral on 08/18/2022 7:43 AM Indications: diagnostic evaluation and pain Details: 18 G 1.5 in needle, ultrasound-guided posterior approach  Arthrogram: No  Medications: 9 mL bupivacaine 0.5 %; 40 mg methylPREDNISolone acetate 40 MG/ML; 5 mL lidocaine 1 % Outcome: tolerated well, no immediate complications Procedure, treatment alternatives, risks and benefits explained, specific risks discussed. Consent was given by the patient. Immediately prior to procedure a time out was called to verify the correct patient, procedure, equipment, support staff and site/side marked as required. Patient was prepped and draped in the usual sterile fashion.       Clinical Data:  No additional findings.  Objective: Vital Signs: LMP 10/07/1990   Physical Exam:  Constitutional: Patient appears well-developed HEENT:  Head: Normocephalic Eyes:EOM are normal Neck: Normal range of motion Cardiovascular: Normal rate Pulmonary/chest: Effort normal Neurologic: Patient is alert Skin: Skin is warm Psychiatric: Patient has normal mood and affect  Ortho Exam: Ortho exam demonstrates no groin pain on the left or right-hand side with internal or external rotation of the leg.  Equivocal nerve root tension signs on the left negative on the right.  No definite paresthesias L1-S1 bilaterally.  Reflexes symmetric 0 to 1+  out of 4 bilateral patella and Achilles.  Normal gait.  Mild pain with forward lateral bending.  No discrete trochanteric tenderness on the left or right-hand side.  Left shoulder demonstrates good rotator cuff strength infraspinatus supraspinatus and subscap muscle testing.  No discrete AC joint tenderness is present.  No asymmetry with external rotation right versus left.  Cervical spine range of motion is full.  No definite paresthesias C5-T1.  Radial pulse intact bilaterally.  Specialty Comments:  No specialty comments available.  Imaging: No results found.   PMFS History: Patient Active Problem List   Diagnosis Date Noted   SI (sacroiliac) joint dysfunction 06/01/2022   Screening mammogram for breast cancer 12/08/2021   BPPV (benign paroxysmal positional vertigo) 12/08/2021   Major depression 08/04/2021   Need for shingles vaccine 06/23/2021   Cervical myelopathy with cervical radiculopathy (HCC) 11/18/2020   LBBB (left bundle branch block) 10/26/2020   Vasomotor symptoms due to menopause 01/07/2019   Lymphedema 12/11/2018   Status post cholecystectomy 05/01/2017   Healthcare maintenance 04/25/2017   Seasonal allergies 10/07/2010   Hyperlipidemia 05/14/2007   Essential hypertension 05/25/2006   Past Medical History:  Diagnosis Date   Hyperlipidemia    Hypertension    Menopause syndrome     Family History  Problem Relation Age of Onset   Heart disease Mother        "enlarged heart"   Hypertension Mother    Diabetes Mother    Heart disease Maternal Uncle        Bypass surg   Heart attack Maternal Grandmother        also had irregular heart beat requriring a pacemaker.   Hypertension Other        In multiple relatives.    Hypertension Paternal Grandmother    COPD Sister    Sarcoidosis Sister    Heart block Sister        s/p PPM   Hypertension Brother    Hypertension Sister    Arthritis Sister    COPD Sister    Diabetes Sister    Hypertension Sister     Arthritis Sister    Hypertension Brother    Healthy Brother    Healthy Brother    Healthy Sister    Breast cancer Neg Hx     Past Surgical History:  Procedure Laterality Date   CHOLECYSTECTOMY N/A 04/05/2017   Procedure: LAPAROSCOPIC CHOLECYSTECTOMY;  Surgeon: Kinsinger, De Blanch, MD;  Location: [facility];  Service: General;  Laterality: N/A;   MOUTH SURGERY     TONSILLECTOMY     TRANSTHORACIC ECHOCARDIOGRAM  04/19/2019    EF 66 5%.  Mild LVH.  GR 1 DD.  Normal atrial sizes.  Mild aortic valve sclerosis but no stenosis.  Normal IVC.  Otherwise normal valves.   TUBAL LIGATION     Social History   Occupational History   Not on file  Tobacco Use  Smoking status: Former   Smokeless tobacco: Never   Tobacco comments:     1 PPD for 2-3 years. Quit ~4098.  Vaping Use   Vaping Use: Never used  Substance and Sexual Activity   Alcohol use: No   Drug use: No   Sexual activity: Yes    Birth control/protection: Surgical

## 2022-08-21 MED ORDER — LIDOCAINE HCL 1 % IJ SOLN
5.0000 mL | INTRAMUSCULAR | Status: AC | PRN
  Administered 2022-08-18: 5 mL

## 2022-08-21 MED ORDER — METHYLPREDNISOLONE ACETATE 40 MG/ML IJ SUSP
40.0000 mg | INTRAMUSCULAR | Status: AC | PRN
  Administered 2022-08-18: 40 mg via INTRA_ARTICULAR

## 2022-08-21 MED ORDER — BUPIVACAINE HCL 0.5 % IJ SOLN
9.0000 mL | INTRAMUSCULAR | Status: AC | PRN
  Administered 2022-08-18: 9 mL via INTRA_ARTICULAR

## 2022-08-27 ENCOUNTER — Ambulatory Visit
Admission: RE | Admit: 2022-08-27 | Discharge: 2022-08-27 | Disposition: A | Discharge status: Home / Self Care | Payer: Medicare Other | Source: Ambulatory Visit | Attending: Orthopedic Surgery | Admitting: Orthopedic Surgery

## 2022-08-27 DIAGNOSIS — M4807 Spinal stenosis, lumbosacral region: Secondary | ICD-10-CM | POA: Diagnosis not present

## 2022-08-27 DIAGNOSIS — M48061 Spinal stenosis, lumbar region without neurogenic claudication: Secondary | ICD-10-CM | POA: Diagnosis not present

## 2022-08-27 DIAGNOSIS — M47816 Spondylosis without myelopathy or radiculopathy, lumbar region: Secondary | ICD-10-CM | POA: Diagnosis not present

## 2022-08-27 DIAGNOSIS — M545 Low back pain, unspecified: Secondary | ICD-10-CM | POA: Diagnosis not present

## 2022-08-27 DIAGNOSIS — M79605 Pain in left leg: Secondary | ICD-10-CM

## 2022-09-01 ENCOUNTER — Ambulatory Visit (INDEPENDENT_AMBULATORY_CARE_PROVIDER_SITE_OTHER): Payer: Medicare Other | Admitting: Surgical

## 2022-09-01 DIAGNOSIS — M79605 Pain in left leg: Secondary | ICD-10-CM

## 2022-09-01 DIAGNOSIS — M5410 Radiculopathy, site unspecified: Secondary | ICD-10-CM

## 2022-09-02 ENCOUNTER — Encounter: Payer: Self-pay | Admitting: Orthopedic Surgery

## 2022-09-02 NOTE — Progress Notes (Signed)
Office Visit Note   Patient: Katherine Haynes           Date of Birth: [birth year]           MRN: [id number] Visit Date: 09/01/2022 Requested by: Reymundo Poll, MD [address] [place],  [place] [address] PCP: Reymundo Poll, MD  Subjective: Chief Complaint  Patient presents with   Left Shoulder - Follow-up   Lower Back - Follow-up    HPI: Katherine Haynes is a 72 y.o. female who presents to the office for MRI review. Patient denies any changes in symptoms.  Continues to complain mainly of left hip buttock pain.  No groin pain.  No leg weakness.  She had left glenohumeral injection at her last visit on 08/18/2022 that gave her 80% relief of her shoulder pain.  She is here today to review MRI of the lumbar spine.  MRI results revealed: MR Lumbar Spine w/o contrast  Result Date: 08/28/2022 CLINICAL DATA:  Low back pain with left sided radiculopathy EXAM: MRI LUMBAR SPINE WITHOUT CONTRAST TECHNIQUE: Multiplanar, multisequence MR imaging of the lumbar spine was performed. No intravenous contrast was administered. COMPARISON:  X-ray 08/18/2022 FINDINGS: Segmentation:  Standard. Alignment: Lumbar dextrocurvature. Grade 1 anterolisthesis of L3 on L4. Vertebrae: No acute fracture. No evidence of discitis. Diffuse marrow heterogeneity without discrete marrow replacing lesion. Findings are nonspecific but can be seen in the setting of chronic anemia, smoking, and/or obesity. Conus medullaris and cauda equina: Conus extends to the L1-L2 level. Conus and cauda equina appear normal. Paraspinal and other soft tissues: Negative. Disc levels: L1-L2: No disc protrusion. Mild bilateral facet arthropathy. No foraminal or canal stenosis. L2-L3: No disc protrusion. Mild bilateral facet arthropathy. No foraminal or canal stenosis. L3-L4: Anterolisthesis with disc uncovering and diffuse disc bulge. Moderate bilateral facet arthropathy with ligamentum flavum buckling. Moderate canal stenosis with mild-to-moderate  bilateral foraminal stenosis. L4-L5: Diffuse disc bulge with moderate bilateral facet arthropathy and ligamentum flavum buckling. Moderate canal stenosis with mild-to-moderate right and mild left foraminal stenosis. L5-S1: Diffuse disc bulge and endplate ridging, eccentric to the right. Right greater than left facet arthropathy with ligamentum flavum buckling. Mild canal stenosis with bilateral subarticular recess stenosis. Moderate-severe right and mild-moderate left foraminal stenosis. IMPRESSION: 1. Multilevel lumbar spondylosis with moderate canal stenosis at L3-L4 and L4-L5. 2. Moderate-to-severe right and mild-to-moderate left foraminal stenosis at L5-S1. 3. Diffuse marrow heterogeneity without discrete marrow replacing lesion. Findings are nonspecific but can be seen in the setting of chronic anemia, smoking, and/or obesity. Electronically Signed   By: Duanne Guess D.O.   On: 08/28/2022 17:38                 ROS: All systems reviewed are negative as they relate to the chief complaint within the history of present illness.  Patient denies fevers or chills.  Assessment & Plan: Visit Diagnoses:  1. Radicular syndrome of left leg   2. Pain in left leg     Plan: Katherine Haynes is a 72 y.o. female who presents to the office for review of lumbar spine MRI.  She has MRI demonstrating moderate spinal canal stenosis at multiple levels primarily L3-L4 and L4-L5.  Also has foraminal stenosis that is worst at L5-S1 primarily on the right side but she does have some left foraminal stenosis at this level as well.  She has continued buttock pain that seems radicular but no weakness on exam.  She does have some reproduction of pain with hip  range of motion so this may be atypical presentation of the mild hip arthritis that she has on repeat radiographs.  Plan is to set her up with lumbar spine ESI with Dr. Alvester Morin to see if this will help with her referred buttock pain.  If she has no relief from this  injection, could consider ultrasound-guided hip joint injection to see if this will help if she is not really having any groin pain.  She does not take any blood thinners.  Follow-up as needed after injection with Dr. Alvester Morin if she has little to no relief.  Follow-Up Instructions: No follow-ups on file.   Orders:  Orders Placed This Encounter  Procedures   Ambulatory referral to Physical Medicine Rehab   No orders of the defined types were placed in this encounter.     Procedures: No procedures performed   Clinical Data: No additional findings.  Objective: Vital Signs: LMP 10/07/1990   Physical Exam:  Constitutional: Patient appears well-developed HEENT:  Head: Normocephalic Eyes:EOM are normal Neck: Normal range of motion Cardiovascular: Normal rate Pulmonary/chest: Effort normal Neurologic: Patient is alert Skin: Skin is warm Psychiatric: Patient has normal mood and affect  Ortho Exam: Ortho exam demonstrates mild reproduction of pain with FADIR sign to the left buttock.  She has some reproduction of pain to this area with resisted hip flexion as well.  She has excellent strength of hip flexion, quadricep, hamstring, dorsiflexion, plantarflexion.  No clonus bilaterally.  Negative straight leg raise.  Specialty Comments:  No specialty comments available.  Imaging: No results found.   PMFS History: Patient Active Problem List   Diagnosis Date Noted   SI (sacroiliac) joint dysfunction 06/01/2022   Screening mammogram for breast cancer 12/08/2021   BPPV (benign paroxysmal positional vertigo) 12/08/2021   Major depression 08/04/2021   Need for shingles vaccine 06/23/2021   Cervical myelopathy with cervical radiculopathy (HCC) 11/18/2020   LBBB (left bundle branch block) 10/26/2020   Vasomotor symptoms due to menopause 01/07/2019   Lymphedema 12/11/2018   Status post cholecystectomy 05/01/2017   Healthcare maintenance 04/25/2017   Seasonal allergies 10/07/2010    Hyperlipidemia 05/14/2007   Essential hypertension 05/25/2006   Past Medical History:  Diagnosis Date   Hyperlipidemia    Hypertension    Menopause syndrome     Family History  Problem Relation Age of Onset   Heart disease Mother        "enlarged heart"   Hypertension Mother    Diabetes Mother    Heart disease Maternal Uncle        Bypass surg   Heart attack Maternal Grandmother        also had irregular heart beat requriring a pacemaker.   Hypertension Other        In multiple relatives.    Hypertension Paternal Grandmother    COPD Sister    Sarcoidosis Sister    Heart block Sister        s/p PPM   Hypertension Brother    Hypertension Sister    Arthritis Sister    COPD Sister    Diabetes Sister    Hypertension Sister    Arthritis Sister    Hypertension Brother    Healthy Brother    Healthy Brother    Healthy Sister    Breast cancer Neg Hx     Past Surgical History:  Procedure Laterality Date   CHOLECYSTECTOMY N/A 04/05/2017   Procedure: LAPAROSCOPIC CHOLECYSTECTOMY;  Surgeon: Kinsinger, De Blanch, MD;  Location: [facility];  Service: General;  Laterality: N/A;   MOUTH SURGERY     TONSILLECTOMY     TRANSTHORACIC ECHOCARDIOGRAM  04/19/2019    EF 66 5%.  Mild LVH.  GR 1 DD.  Normal atrial sizes.  Mild aortic valve sclerosis but no stenosis.  Normal IVC.  Otherwise normal valves.   TUBAL LIGATION     Social History   Occupational History   Not on file  Tobacco Use   Smoking status: Former   Smokeless tobacco: Never   Tobacco comments:     1 PPD for 2-3 years. Quit ~1610.  Vaping Use   Vaping Use: Never used  Substance and Sexual Activity   Alcohol use: No   Drug use: No   Sexual activity: Yes    Birth control/protection: Surgical

## 2022-09-03 ENCOUNTER — Other Ambulatory Visit: Payer: Self-pay | Admitting: Internal Medicine

## 2022-09-03 DIAGNOSIS — R600 Localized edema: Secondary | ICD-10-CM

## 2022-09-13 ENCOUNTER — Ambulatory Visit (INDEPENDENT_AMBULATORY_CARE_PROVIDER_SITE_OTHER): Payer: Medicare Other | Admitting: Physical Medicine and Rehabilitation

## 2022-09-13 ENCOUNTER — Ambulatory Visit: Payer: Self-pay

## 2022-09-13 VITALS — BP 149/77 | HR 68

## 2022-09-13 DIAGNOSIS — M5416 Radiculopathy, lumbar region: Secondary | ICD-10-CM

## 2022-09-13 MED ORDER — METHYLPREDNISOLONE ACETATE 80 MG/ML IJ SUSP
80.0000 mg | Freq: Once | INTRAMUSCULAR | Status: AC
  Administered 2022-09-13: 80 mg

## 2022-09-13 NOTE — Progress Notes (Signed)
Functional Pain Scale - descriptive words and definitions  Distressing (6)    Pain is present/unable to complete most ADLs limited by pain/sleep is difficult and active distraction is only marginal. Moderate range order  Average Pain 8-9   +Driver, -BT, -Dye Allergies- has allergy to contrast, causes itching.  Lower back pain on left side that radiates into buttocks

## 2022-09-13 NOTE — Patient Instructions (Signed)

## 2022-09-14 NOTE — Progress Notes (Signed)
Katherine Haynes - 72 y.o. female MRN [id number]  Date of birth: 12/30/1950  Office Visit Note: Visit Date: 09/13/2022 PCP: Reymundo Poll, MD Referred by: Reymundo Poll, MD  Subjective: Chief Complaint  Patient presents with   Lower Back - Pain   HPI:  Katherine Haynes is a 72 y.o. female who comes in today at the request of Katherine Cai, PA-C for planned Left L4-5 Lumbar Interlaminar epidural steroid injection with fluoroscopic guidance.  The patient has failed conservative care including home exercise, medications, time and activity modification.  This injection will be diagnostic and hopefully therapeutic.  Please see requesting physician notes for further details and justification.   ROS Otherwise per HPI.  Assessment & Plan: Visit Diagnoses:    ICD-10-CM   1. Lumbar radiculopathy  M54.16 XR C-ARM NO REPORT    Epidural Steroid injection    methylPREDNISolone acetate (DEPO-MEDROL) injection 80 mg      Plan: No additional findings.   Meds & Orders:  Meds ordered this encounter  Medications   methylPREDNISolone acetate (DEPO-MEDROL) injection 80 mg    Orders Placed This Encounter  Procedures   XR C-ARM NO REPORT   Epidural Steroid injection    Follow-up: Return for visit to requesting provider as needed.   Procedures: No procedures performed  Lumbar Epidural Steroid Injection - Interlaminar Approach with Fluoroscopic Guidance  Patient: Katherine Haynes      Date of Birth: 05-13-1951 MRN: [id number] PCP: Reymundo Poll, MD      Visit Date: 09/13/2022   Universal Protocol:     Consent Given By: the patient  Position: PRONE  Additional Comments: Vital signs were monitored before and after the procedure. Patient was prepped and draped in the usual sterile fashion. The correct patient, procedure, and site was verified.   Injection Procedure Details:   Procedure diagnoses: Lumbar radiculopathy [M54.16]   Meds Administered:  Meds ordered this  encounter  Medications   methylPREDNISolone acetate (DEPO-MEDROL) injection 80 mg     Laterality: Left  Location/Site:  L4-5  Needle: 3.5 in., 20 ga. Tuohy  Needle Placement: Paramedian epidural  Findings:   -Comments: Excellent flow of contrast into the epidural space.  Procedure Details: Using a paramedian approach from the side mentioned above, the region overlying the inferior lamina was localized under fluoroscopic visualization and the soft tissues overlying this structure were infiltrated with 4 ml. of 1% Lidocaine without Epinephrine. The Tuohy needle was inserted into the epidural space using a paramedian approach.   The epidural space was localized using loss of resistance along with counter oblique bi-planar fluoroscopic views.  After negative aspirate for air, blood, and CSF, a 2 ml. volume of Isovue-250 was injected into the epidural space and the flow of contrast was observed. Radiographs were obtained for documentation purposes.    The injectate was administered into the level noted above.   Additional Comments:  No complications occurred Dressing: 2 x 2 sterile gauze and Band-Aid    Post-procedure details: Patient was observed during the procedure. Post-procedure instructions were reviewed.  Patient left the clinic in stable condition.   Clinical History: MRI LUMBAR SPINE WITHOUT CONTRAST   TECHNIQUE: Multiplanar, multisequence MR imaging of the lumbar spine was performed. No intravenous contrast was administered.   COMPARISON:  X-ray 08/18/2022   FINDINGS: Segmentation:  Standard.   Alignment: Lumbar dextrocurvature. Grade 1 anterolisthesis of L3 on L4.   Vertebrae: No acute fracture. No evidence of discitis. Diffuse marrow heterogeneity without discrete marrow replacing  lesion. Findings are nonspecific but can be seen in the setting of chronic anemia, smoking, and/or obesity.   Conus medullaris and cauda equina: Conus extends to the L1-L2  level. Conus and cauda equina appear normal.   Paraspinal and other soft tissues: Negative.   Disc levels:   L1-L2: No disc protrusion. Mild bilateral facet arthropathy. No foraminal or canal stenosis.   L2-L3: No disc protrusion. Mild bilateral facet arthropathy. No foraminal or canal stenosis.   L3-L4: Anterolisthesis with disc uncovering and diffuse disc bulge. Moderate bilateral facet arthropathy with ligamentum flavum buckling. Moderate canal stenosis with mild-to-moderate bilateral foraminal stenosis.   L4-L5: Diffuse disc bulge with moderate bilateral facet arthropathy and ligamentum flavum buckling. Moderate canal stenosis with mild-to-moderate right and mild left foraminal stenosis.   L5-S1: Diffuse disc bulge and endplate ridging, eccentric to the right. Right greater than left facet arthropathy with ligamentum flavum buckling. Mild canal stenosis with bilateral subarticular recess stenosis. Moderate-severe right and mild-moderate left foraminal stenosis.   IMPRESSION: 1. Multilevel lumbar spondylosis with moderate canal stenosis at L3-L4 and L4-L5. 2. Moderate-to-severe right and mild-to-moderate left foraminal stenosis at L5-S1. 3. Diffuse marrow heterogeneity without discrete marrow replacing lesion. Findings are nonspecific but can be seen in the setting of chronic anemia, smoking, and/or obesity.     Electronically Signed   By: Duanne Guess D.O.   On: 08/28/2022 17:38     Objective:  VS:  HT:    WT:   BMI:     BP:(!) 149/77  HR:68bpm  TEMP: ( )  RESP:  Physical Exam Vitals and nursing note reviewed.  Constitutional:      General: She is not in acute distress.    Appearance: Normal appearance. She is not ill-appearing.  HENT:     Head: Normocephalic and atraumatic.     Right Ear: External ear normal.     Left Ear: External ear normal.  Eyes:     Extraocular Movements: Extraocular movements intact.  Cardiovascular:     Rate and Rhythm:  Normal rate.     Pulses: Normal pulses.  Pulmonary:     Effort: Pulmonary effort is normal. No respiratory distress.  Abdominal:     General: There is no distension.     Palpations: Abdomen is soft.  Musculoskeletal:        General: Tenderness present.     Cervical back: Neck supple.     Right lower leg: No edema.     Left lower leg: No edema.     Comments: Patient has good distal strength with no pain over the greater trochanters.  No clonus or focal weakness.  Skin:    Findings: No erythema, lesion or rash.  Neurological:     General: No focal deficit present.     Mental Status: She is alert and oriented to person, place, and time.     Sensory: No sensory deficit.     Motor: No weakness or abnormal muscle tone.     Coordination: Coordination normal.  Psychiatric:        Mood and Affect: Mood normal.        Behavior: Behavior normal.      Imaging: No results found.

## 2022-09-14 NOTE — Procedures (Signed)
Lumbar Epidural Steroid Injection - Interlaminar Approach with Fluoroscopic Guidance  Patient: Katherine Haynes      Date of Birth: [birth year] MRN: [id number] PCP: Velna Ochs, MD      Visit Date: 09/13/2022   Universal Protocol:     Consent Given By: the patient  Position: PRONE  Additional Comments: Vital signs were monitored before and after the procedure. Patient was prepped and draped in the usual sterile fashion. The correct patient, procedure, and site was verified.   Injection Procedure Details:   Procedure diagnoses: Lumbar radiculopathy [M54.16]   Meds Administered:  Meds ordered this encounter  Medications   methylPREDNISolone acetate (DEPO-MEDROL) injection 80 mg     Laterality: Left  Location/Site:  L4-5  Needle: 3.5 in., 20 ga. Tuohy  Needle Placement: Paramedian epidural  Findings:   -Comments: Excellent flow of contrast into the epidural space.  Procedure Details: Using a paramedian approach from the side mentioned above, the region overlying the inferior lamina was localized under fluoroscopic visualization and the soft tissues overlying this structure were infiltrated with 4 ml. of 1% Lidocaine without Epinephrine. The Tuohy needle was inserted into the epidural space using a paramedian approach.   The epidural space was localized using loss of resistance along with counter oblique bi-planar fluoroscopic views.  After negative aspirate for air, blood, and CSF, a 2 ml. volume of Isovue-250 was injected into the epidural space and the flow of contrast was observed. Radiographs were obtained for documentation purposes.    The injectate was administered into the level noted above.   Additional Comments:  No complications occurred Dressing: 2 x 2 sterile gauze and Band-Aid    Post-procedure details: Patient was observed during the procedure. Post-procedure instructions were reviewed.  Patient left the clinic in stable condition.

## 2022-10-09 ENCOUNTER — Other Ambulatory Visit: Payer: Self-pay | Admitting: Internal Medicine

## 2022-10-09 DIAGNOSIS — I10 Essential (primary) hypertension: Secondary | ICD-10-CM

## 2022-10-19 ENCOUNTER — Encounter: Payer: Medicare Other | Admitting: Internal Medicine

## 2022-12-14 ENCOUNTER — Other Ambulatory Visit: Payer: Self-pay | Admitting: Internal Medicine

## 2022-12-14 DIAGNOSIS — N951 Menopausal and female climacteric states: Secondary | ICD-10-CM

## 2022-12-23 ENCOUNTER — Other Ambulatory Visit: Payer: Self-pay | Admitting: Internal Medicine

## 2022-12-23 DIAGNOSIS — Z0000 Encounter for general adult medical examination without abnormal findings: Secondary | ICD-10-CM

## 2022-12-30 ENCOUNTER — Ambulatory Visit
Admission: RE | Admit: 2022-12-30 | Discharge: 2022-12-30 | Disposition: A | Discharge status: Home / Self Care | Payer: Medicare Other | Source: Ambulatory Visit | Attending: Internal Medicine | Admitting: Internal Medicine

## 2022-12-30 DIAGNOSIS — Z0000 Encounter for general adult medical examination without abnormal findings: Secondary | ICD-10-CM

## 2023-01-08 ENCOUNTER — Other Ambulatory Visit: Payer: Self-pay | Admitting: Internal Medicine

## 2023-01-08 DIAGNOSIS — I10 Essential (primary) hypertension: Secondary | ICD-10-CM

## 2023-01-10 ENCOUNTER — Other Ambulatory Visit: Payer: Self-pay | Admitting: Internal Medicine

## 2023-01-10 DIAGNOSIS — R600 Localized edema: Secondary | ICD-10-CM

## 2023-01-10 NOTE — Telephone Encounter (Signed)
LOV was 05/2022. Pt called to schedule an appt - call transferred to front office. Appt schedule w/Dr Antony Contras 01/25/23.

## 2023-01-25 ENCOUNTER — Ambulatory Visit: Payer: Medicare Other | Admitting: Internal Medicine

## 2023-01-25 ENCOUNTER — Other Ambulatory Visit: Payer: Self-pay

## 2023-01-25 ENCOUNTER — Encounter: Payer: Self-pay | Admitting: Internal Medicine

## 2023-01-25 VITALS — BP 128/64 | HR 77 | Temp 98.3°F | Ht 66.0 in | Wt 162.1 lb

## 2023-01-25 DIAGNOSIS — F5101 Primary insomnia: Secondary | ICD-10-CM

## 2023-01-25 DIAGNOSIS — E785 Hyperlipidemia, unspecified: Secondary | ICD-10-CM

## 2023-01-25 DIAGNOSIS — M533 Sacrococcygeal disorders, not elsewhere classified: Secondary | ICD-10-CM | POA: Diagnosis not present

## 2023-01-25 DIAGNOSIS — M5481 Occipital neuralgia: Secondary | ICD-10-CM

## 2023-01-25 DIAGNOSIS — I890 Lymphedema, not elsewhere classified: Secondary | ICD-10-CM

## 2023-01-25 DIAGNOSIS — E782 Mixed hyperlipidemia: Secondary | ICD-10-CM

## 2023-01-25 DIAGNOSIS — G4700 Insomnia, unspecified: Secondary | ICD-10-CM

## 2023-01-25 DIAGNOSIS — M7061 Trochanteric bursitis, right hip: Secondary | ICD-10-CM | POA: Insufficient documentation

## 2023-01-25 DIAGNOSIS — H8110 Benign paroxysmal vertigo, unspecified ear: Secondary | ICD-10-CM

## 2023-01-25 DIAGNOSIS — H8113 Benign paroxysmal vertigo, bilateral: Secondary | ICD-10-CM

## 2023-01-25 DIAGNOSIS — R0781 Pleurodynia: Secondary | ICD-10-CM

## 2023-01-25 DIAGNOSIS — I10 Essential (primary) hypertension: Secondary | ICD-10-CM

## 2023-01-25 DIAGNOSIS — R0789 Other chest pain: Secondary | ICD-10-CM

## 2023-01-25 MED ORDER — DICLOFENAC SODIUM 1 % EX GEL: 4.0000 g | Freq: Four times a day (QID) | CUTANEOUS | 1 refills | Status: DC

## 2023-01-25 NOTE — Assessment & Plan Note (Signed)
BP is controlled today 128/64. She takes Amlodipine as prescribed and tolerates this medication.   Will continue to monitor and recommend current medication regimen.

## 2023-01-25 NOTE — Assessment & Plan Note (Signed)
She recently went to ortho for left leg pain in February and received a steroid injection, which alleviated her left leg pain that she describes as sciatic pain.   Will continue to monitor.

## 2023-01-25 NOTE — Assessment & Plan Note (Signed)
She endorses new onset of right hip pain which began a month ago. She sleeps on right side and it woke her up. Takes alleve sometimes and gives some relief. Walking makes pain worse. Does not occur every day, but sometimes wakes up with the ache in her right side. Notices sometimes when cloudy pain is worse. Ranks about 8/10 at worst; currently is 5/10.   She recently went to ortho for left leg pain in February and received a steroid injection, which alleviated her left leg pain that she describes as sciatic pain. She feels this right hip pain is different. And describes it as a localized ache in the right hip. She endorses occasional back pain when bent over the sink in her middle lower back. She denies shooting pain down the right leg or numbness/tingling in right leg. She denies losing control of bladder or bowels.   Point tenderness over trochanteric bursa and limited ROM. Will recommend topical voltaren for pain and inflammation and reassess at 1 month follow up.

## 2023-01-25 NOTE — Assessment & Plan Note (Signed)
Her BPPV is improved today. She attempted the Epley maneuver some times initially, but has not done it frequently due to her responsibilities caring for her husband who is currently sick. She feels that her dizziness is much better.   Will continue to monitor at follow up.

## 2023-01-25 NOTE — Patient Instructions (Addendum)
Hi Ms. Rawl,   It was wonderful seeing you today! I am sending in a Voltaren gel to your pharmacy for you to apply to your right hip. I recommend you also take melatonin 3-5mg  as needed before bed for insomnia. You can get this at your pharmacy. I will be calling you with lab results as well. I would like to see you for follow up in 1 month to see how your sleep and mood are doing.   If you have any questions or concerns, call our clinic at [phone] or after hours call [phone] and ask for the internal medicine resident on call.   Thank you!

## 2023-01-25 NOTE — Progress Notes (Unsigned)
This is a Psychologist, occupational Note.  The care of the patient was discussed with Dr. Antony Contras and the assessment and plan was formulated with their assistance.  Please see their note for official documentation of the patient encounter.   Subjective:   Patient ID: Katherine Haynes female   DOB: 1950-12-26 72 y.o.   MRN: [id number]  HPI: [name] is a 72 y.o. with a PMH of BPPV, hyperlipidemia, lymphedema, HTN who presents today for a follow up. She endorses new onset of right hip pain which began a month ago. She sleeps on right side and it woke her up. Takes alleve sometimes and gives some relief. Walking makes pain worse. Does not occur every day, but sometimes wakes up with the ache in her right side. Notices sometimes when cloudy pain is worse. Ranks about 8/10 at worst; currently is 5/10.   She recently went to ortho for left leg pain in February and received a steroid injection, which alleviated her left leg pain that she describes as sciatic pain. She feels this right hip pain is different. And describes it as a localized ache in the right hip. She endorses occasional back pain when bent over the sink in her middle lower back. She denies shooting pain down the right leg or numbness/tingling in right leg. She denies losing control of bladder or bowels.   She currently takes lasix, amlodipine, gabapentin, atorvastatin, potassium, alleve, and allegra as prescribed and tolerates these well. She has not been taking Robaxin since it sometimes causes her drowsiness and she wants to be alert to care for her husband.  For the details of today's visit, please refer to the assessment and plan.    Past Medical History:  Diagnosis Date   Hyperlipidemia    Hypertension    Menopause syndrome    Current Outpatient Medications  Medication Sig Dispense Refill   amLODipine (NORVASC) 10 MG tablet TAKE 1 TABLET(10 MG) BY MOUTH DAILY 90 tablet 3   atorvastatin (LIPITOR) 40 MG tablet Take 1 tablet (40  mg total) by mouth daily. 90 tablet 3   diclofenac Sodium (VOLTAREN ARTHRITIS PAIN) 1 % GEL Apply 2 g topically 2 (two) times daily as needed. 100 g 0   diclofenac Sodium (VOLTAREN) 1 % GEL Apply 4 g topically 4 (four) times daily. 150 g 1   diphenhydrAMINE (BENADRYL ALLERGY) 25 mg capsule Please take 50 mg (2 tablets) 1 hour prior to your CT scan. 30 capsule 0   escitalopram (LEXAPRO) 20 MG tablet Take 1 tablet (20 mg total) by mouth daily. 90 tablet 3   furosemide (LASIX) 20 MG tablet TAKE 3 TABLETS(60 MG) BY MOUTH DAILY 270 tablet 0   methocarbamol (ROBAXIN) 750 MG tablet Take 2 tablets (1,500 mg total) by mouth every 8 (eight) hours as needed for muscle spasms. 180 tablet 1   naproxen (NAPROSYN) 500 MG tablet Take 1 tablet (500 mg total) by mouth 2 (two) times daily with a meal. 60 tablet 2   potassium chloride (KLOR-CON) 10 MEQ tablet Take 1 tablet (10 mEq total) by mouth daily. 90 tablet 3   No current facility-administered medications for this visit.    Review of Systems: Pertinent items are noted in HPI. Objective:  Physical Exam: Vitals:   01/25/23 0948  BP: 128/64  Pulse: 77  Temp: 98.3 F (36.8 C)  TempSrc: Oral  SpO2: 99%  Weight: 162 lb 1.6 oz (73.5 kg)  Height: 5\' 6"  (1.676 m)    Constitutional:  NAD, appears comfortable Cardiovascular: RRR, no murmurs auscultated over aortic or pulmonic posts.  Pulmonary/Chest: CTAB, I heard normal breath sounds.   Extremities: 1+ nonpitting edema bilaterally.  Psychiatric: Normal mood and affect  Assessment & Plan:   SI (sacroiliac) joint dysfunction She recently went to ortho for left leg pain in February and received a steroid injection, which alleviated her left leg pain that she describes as sciatic pain.   Will continue to monitor.   Trochanteric bursitis of right hip She endorses new onset of right hip pain which began a month ago. She sleeps on right side and it woke her up. Takes alleve sometimes and gives some  relief. Walking makes pain worse. Does not occur every day, but sometimes wakes up with the ache in her right side. Notices sometimes when cloudy pain is worse. Ranks about 8/10 at worst; currently is 5/10.   She recently went to ortho for left leg pain in February and received a steroid injection, which alleviated her left leg pain that she describes as sciatic pain. She feels this right hip pain is different. And describes it as a localized ache in the right hip. She endorses occasional back pain when bent over the sink in her middle lower back. She denies shooting pain down the right leg or numbness/tingling in right leg. She denies losing control of bladder or bowels.   Point tenderness over trochanteric bursa and limited ROM. Will recommend topical voltaren for pain and inflammation and reassess at 1 month follow up.   Hyperlipidemia She is currently taking atorvastatin as prescribed and tolerates it. Will recheck lipids today and recommend continuing medication regimen.   Lymphedema She has noticed still having some leg swelling, especially if she does not use compression socks. She takes Lasix regularly as prescribed. 1+ nonpitting edema bilaterally on physical exam today.   Will check BMP since on Lasix and monitor at follow up.   Insomnia She sometimes takes 2 of her 20 mg Lexapro to help her mood. She endorses not getting much sleep and waking up around 4-5 am. She endorses stress surrounding caring for her husband who is sick currently.   Will recommend 3-5 mg of melatonin at night prior to sleep for insomnia and reassess mood in 1 month.   BPPV (benign paroxysmal positional vertigo) Her BPPV is improved today. She attempted the Epley maneuver some times initially, but has not done it frequently due to her responsibilities caring for her husband who is currently sick. She feels that her dizziness is much better.   Will continue to monitor at follow up.   Essential hypertension BP  is controlled today 128/64. She takes Amlodipine as prescribed and tolerates this medication.   Will continue to monitor and recommend current medication regimen.

## 2023-01-25 NOTE — Assessment & Plan Note (Signed)
She sometimes takes 2 of her 20 mg Lexapro to help her mood. She endorses not getting much sleep and waking up around 4-5 am. She endorses stress surrounding caring for her husband who is sick currently.   Will recommend 3-5 mg of melatonin at night prior to sleep for insomnia and reassess mood in 1 month.

## 2023-01-25 NOTE — Assessment & Plan Note (Signed)
She is currently taking atorvastatin as prescribed and tolerates it. Will recheck lipids today and recommend continuing medication regimen.

## 2023-01-25 NOTE — Assessment & Plan Note (Signed)
She has noticed still having some leg swelling, especially if she does not use compression socks. She takes Lasix regularly as prescribed. 1+ nonpitting edema bilaterally on physical exam today.   Will check BMP since on Lasix and monitor at follow up.

## 2023-01-26 LAB — LIPID PANEL
Chol/HDL Ratio: 2.1 ratio (ref 0.0–4.4)
Cholesterol, Total: 150 mg/dL (ref 100–199)
HDL: 70 mg/dL (ref >39)
LDL Chol Calc (NIH): 63 mg/dL (ref 0–99)
Triglycerides: 95 mg/dL (ref 0–149)
VLDL Cholesterol Cal: 17 mg/dL (ref 5–40)

## 2023-01-26 LAB — BMP8+ANION GAP
Anion Gap: 14 mmol/L (ref 10.0–18.0)
BUN/Creatinine Ratio: 16 (ref 12–28)
BUN: 16 mg/dL (ref 8–27)
CO2: 24 mmol/L (ref 20–29)
Calcium: 9.1 mg/dL (ref 8.7–10.3)
Chloride: 103 mmol/L (ref 96–106)
Creatinine, Ser: 1.03 mg/dL — ABNORMAL HIGH (ref 0.57–1.00)
Glucose: 91 mg/dL (ref 70–99)
Potassium: 4.6 mmol/L (ref 3.5–5.2)
Sodium: 141 mmol/L (ref 134–144)
eGFR: 58 mL/min/{1.73_m2} — ABNORMAL LOW (ref >59)

## 2023-02-03 ENCOUNTER — Other Ambulatory Visit: Payer: Self-pay | Admitting: Internal Medicine

## 2023-02-03 DIAGNOSIS — F321 Major depressive disorder, single episode, moderate: Secondary | ICD-10-CM

## 2023-02-10 ENCOUNTER — Other Ambulatory Visit: Payer: Self-pay | Admitting: Internal Medicine

## 2023-02-10 DIAGNOSIS — M533 Sacrococcygeal disorders, not elsewhere classified: Secondary | ICD-10-CM

## 2023-02-10 DIAGNOSIS — M7061 Trochanteric bursitis, right hip: Secondary | ICD-10-CM

## 2023-02-18 ENCOUNTER — Other Ambulatory Visit: Payer: Self-pay | Admitting: Internal Medicine

## 2023-02-18 DIAGNOSIS — E785 Hyperlipidemia, unspecified: Secondary | ICD-10-CM

## 2023-02-20 NOTE — Telephone Encounter (Signed)
Next appt scheduled  8/14 with PCP.

## 2023-03-01 ENCOUNTER — Ambulatory Visit (INDEPENDENT_AMBULATORY_CARE_PROVIDER_SITE_OTHER): Payer: Medicare Other | Admitting: Internal Medicine

## 2023-03-01 ENCOUNTER — Encounter: Payer: Self-pay | Admitting: Internal Medicine

## 2023-03-01 ENCOUNTER — Other Ambulatory Visit: Payer: Self-pay

## 2023-03-01 ENCOUNTER — Ambulatory Visit: Payer: Medicare Other

## 2023-03-01 VITALS — BP 130/55 | HR 79 | Temp 97.9°F | Ht 66.0 in | Wt 165.2 lb

## 2023-03-01 DIAGNOSIS — E782 Mixed hyperlipidemia: Secondary | ICD-10-CM | POA: Diagnosis not present

## 2023-03-01 DIAGNOSIS — I10 Essential (primary) hypertension: Secondary | ICD-10-CM | POA: Diagnosis not present

## 2023-03-01 DIAGNOSIS — M7061 Trochanteric bursitis, right hip: Secondary | ICD-10-CM

## 2023-03-01 DIAGNOSIS — R600 Localized edema: Secondary | ICD-10-CM | POA: Diagnosis not present

## 2023-03-01 DIAGNOSIS — I739 Peripheral vascular disease, unspecified: Secondary | ICD-10-CM | POA: Diagnosis not present

## 2023-03-01 DIAGNOSIS — Z0000 Encounter for general adult medical examination without abnormal findings: Secondary | ICD-10-CM

## 2023-03-01 DIAGNOSIS — F5101 Primary insomnia: Secondary | ICD-10-CM | POA: Diagnosis not present

## 2023-03-01 MED ORDER — FUROSEMIDE 20 MG PO TABS: 60.0000 mg | ORAL_TABLET | Freq: Every day | ORAL | 1 refills | Status: DC

## 2023-03-01 NOTE — Assessment & Plan Note (Signed)
Chronic and well controlled. We did discuss her snoring today. She is overall low risk for OSA. She does snore but denies any witnessed apneic episodes. No morning headache, some day time fatigue but no daytime somnolence. BP is well controlled. Provided reassurance but could consider sleep study in future if we are worried about worsening symptoms.

## 2023-03-01 NOTE — Assessment & Plan Note (Signed)
Patient reports cold extremities and difficulty staying warm; sometimes has issues with cramping pain in her legs on exertion. I am unable to palpate pedal pulses due to edema, however her legs are warm on exam. She doe have risk factors for PVD. Discussed screening with ABIs. Order placed.

## 2023-03-01 NOTE — Patient Instructions (Signed)
Katherine Haynes,  It was a pleasure to see you today. Please continue to take all of your medications as prescribed. Follow up with me again in 6 months. I will call you once I get the results of your leg studies.   If you have any questions or concerns, call our clinic at [phone] or after hours call [phone] and ask for the internal medicine resident on call.   Thank you!  Dr. Reece Agar

## 2023-03-01 NOTE — Assessment & Plan Note (Signed)
Improved, managing well with topical voltaren gel. Sometimes has worsening pain when she sleeps on that side which she tries to avoid. Declines steroid injection today but will call back to schedule if pain is worsening.

## 2023-03-01 NOTE — Progress Notes (Signed)
Subjective:   Patient ID: Katherine Haynes female   DOB: 1951/06/21 72 y.o.   MRN: [id number]  HPI: [name] is a 72 y.o. female with past medical history outlined below here for follow up of insomnia. For further details of today's visit, please refer to the assessment and plan below.   Past Medical History:  Diagnosis Date   Hyperlipidemia    Hypertension    Menopause syndrome    Current Outpatient Medications  Medication Sig Dispense Refill   amLODipine (NORVASC) 10 MG tablet TAKE 1 TABLET(10 MG) BY MOUTH DAILY 90 tablet 3   atorvastatin (LIPITOR) 40 MG tablet TAKE 1 TABLET(40 MG) BY MOUTH DAILY 90 tablet 3   diclofenac Sodium (VOLTAREN ARTHRITIS PAIN) 1 % GEL Apply 2 g topically 2 (two) times daily as needed. 100 g 0   diclofenac Sodium (VOLTAREN) 1 % GEL APPLY 4G TOPICALLY 4 TIMES DAILY 100 g 2   diphenhydrAMINE (BENADRYL ALLERGY) 25 mg capsule Please take 50 mg (2 tablets) 1 hour prior to your CT scan. 30 capsule 0   escitalopram (LEXAPRO) 20 MG tablet TAKE 1 TABLET(20 MG) BY MOUTH DAILY 90 tablet 3   furosemide (LASIX) 20 MG tablet Take 3 tablets (60 mg total) by mouth daily. 270 tablet 1   methocarbamol (ROBAXIN) 750 MG tablet Take 2 tablets (1,500 mg total) by mouth every 8 (eight) hours as needed for muscle spasms. 180 tablet 1   naproxen (NAPROSYN) 500 MG tablet Take 1 tablet (500 mg total) by mouth 2 (two) times daily with a meal. 60 tablet 2   potassium chloride (KLOR-CON) 10 MEQ tablet Take 1 tablet (10 mEq total) by mouth daily. 90 tablet 3   No current facility-administered medications for this visit.   Family History  Problem Relation Age of Onset   Heart disease Mother        "enlarged heart"   Hypertension Mother    Diabetes Mother    Heart disease Maternal Uncle        Bypass surg   Heart attack Maternal Grandmother        also had irregular heart beat requriring a pacemaker.   Hypertension Other        In multiple relatives.    Hypertension  Paternal Grandmother    COPD Sister    Sarcoidosis Sister    Heart block Sister        s/p PPM   Hypertension Brother    Hypertension Sister    Arthritis Sister    COPD Sister    Diabetes Sister    Hypertension Sister    Arthritis Sister    Hypertension Brother    Healthy Brother    Healthy Brother    Healthy Sister    Breast cancer Neg Hx    Social History   Socioeconomic History   Marital status: Married    Spouse name: Not on file   Number of children: Not on file   Years of education: Not on file   Highest education level: Not on file  Occupational History   Not on file  Tobacco Use   Smoking status: Former   Smokeless tobacco: Never   Tobacco comments:     1 PPD for 2-3 years. Quit ~6606.  Vaping Use   Vaping status: Never Used  Substance and Sexual Activity   Alcohol use: No   Drug use: No   Sexual activity: Yes    Birth control/protection: Surgical  Other Topics Concern  Not on file  Social History Narrative   Lives with husband. Good relationship, feels safe at home. Studying medical administration.       Current Social History 09/27/2018        Patient lives with spouse in an apartment on the second floor. There are 14 steps with handrails up to the entrance the patient uses.       Patient's method of transportation is personal car.      The highest level of education was high school diploma.      The patient currently is employed as a Engineer, site in patient's homes.      Identified important Relationships are "My husband, my kids, my grands and great grands."       Pets : None       Interests / Fun: Watching TV, going to beach and park, cooking out with family.       Current Stressors: "finances, teenagers" (grandkids)       Religious / Personal Beliefs: [employer], "I was raised to treat people the way you want to be treated.          Social Determinants of Health   Financial Resource Strain: Low Risk  (03/01/2023)   Overall Financial  Resource Strain (CARDIA)    Difficulty of Paying Living Expenses: Not hard at all  Food Insecurity: No Food Insecurity (03/01/2023)   Hunger Vital Sign    Worried About Running Out of Food in the Last Year: Never true    Ran Out of Food in the Last Year: Never true  Transportation Needs: No Transportation Needs (03/01/2023)   PRAPARE - Administrator, Civil Service (Medical): No    Lack of Transportation (Non-Medical): No  Physical Activity: Insufficiently Active (03/01/2023)   Exercise Vital Sign    Days of Exercise per Week: 4 days    Minutes of Exercise per Session: 30 min  Stress: No Stress Concern Present (03/01/2023)   [employer] of Occupational Health - Occupational Stress Questionnaire    Feeling of Stress : Only a little  Social Connections: Moderately Integrated (03/01/2023)   Social Connection and Isolation Panel [NHANES]    Frequency of Communication with Friends and Family: More than three times a week    Frequency of Social Gatherings with Friends and Family: More than three times a week    Attends Religious Services: More than 4 times per year    Active Member of [employer] or Organizations: No    Attends Banker Meetings: Never    Marital Status: Married     Objective:  Physical Exam:  Vitals:   03/01/23 0915  BP: (!) 130/55  Pulse: 79  Temp: 97.9 F (36.6 C)  TempSrc: Oral  SpO2: 99%  Weight: 165 lb 3.2 oz (74.9 kg)  Height: 5\' 6"  (1.676 m)    Constitutional: NAD, well appearing  Cardiovascular: RRR, no m/r/g Pulmonary/Chest: Clear bilaterally normal effort Extremities: Warm, Non pitting edema bilaterally Psychiatric: Normal mood and affect, pleasant   Assessment & Plan:   Trochanteric bursitis of right hip Improved, managing well with topical voltaren gel. Sometimes has worsening pain when she sleeps on that side which she tries to avoid. Declines steroid injection today but will call back to schedule if pain is worsening.    Insomnia Doing well with her lexapro and OTC melatonin. This works well enough for her, she is happy with this regimen. Continue.   Hyperlipidemia Last LDL at goal for primary prevention.  Continue lipitor 40 mg daily.   Essential hypertension Chronic and well controlled. We did discuss her snoring today. She is overall low risk for OSA. She does snore but denies any witnessed apneic episodes. No morning headache, some day time fatigue but no daytime somnolence. BP is well controlled. Provided reassurance but could consider sleep study in future if we are worried about worsening symptoms.   Claudication [facility]) Patient reports cold extremities and difficulty staying warm; sometimes has issues with cramping pain in her legs on exertion. I am unable to palpate pedal pulses due to edema, however her legs are warm on exam. She doe have risk factors for PVD. Discussed screening with ABIs. Order placed.

## 2023-03-01 NOTE — Assessment & Plan Note (Signed)
Last LDL at goal for primary prevention. Continue lipitor 40 mg daily.

## 2023-03-01 NOTE — Assessment & Plan Note (Signed)
Doing well with her lexapro and OTC melatonin. This works well enough for her, she is happy with this regimen. Continue.

## 2023-03-01 NOTE — Progress Notes (Signed)
Subjective:   Katherine Haynes is a 72 y.o. female who presents for Medicare Annual (Subsequent) preventive examination.  Visit Complete: In person  Patient Medicare AWV questionnaire was completed by the patient on 03/01/2023; I have confirmed that all information answered by patient is correct and no changes since this date.  Review of Systems    Defer to PCP       Objective:    Today's Vitals   03/01/23 1128  BP: (!) 130/55  Pulse: 79  Temp: 97.9 F (36.6 C)  TempSrc: Oral  SpO2: 99%  Weight: 165 lb 3.2 oz (74.9 kg)  Height: 5\' 6"  (1.676 m)  PainSc: 4    Body mass index is 26.66 kg/m.     03/01/2023   11:30 AM 03/01/2023    9:21 AM 01/25/2023    9:52 AM 06/01/2022   10:44 AM 01/31/2022    9:37 AM 01/31/2022    8:58 AM 12/08/2021    9:02 AM  Advanced Directives  Does Patient Have a Medical Advance Directive? No No No No No No No  Would patient like information on creating a medical advance directive? No - Patient declined No - Patient declined No - Patient declined No - Patient declined No - Patient declined No - Patient declined No - Patient declined    Current Medications (verified) Outpatient Encounter Medications as of 03/01/2023  Medication Sig   amLODipine (NORVASC) 10 MG tablet TAKE 1 TABLET(10 MG) BY MOUTH DAILY   atorvastatin (LIPITOR) 40 MG tablet TAKE 1 TABLET(40 MG) BY MOUTH DAILY   diclofenac Sodium (VOLTAREN ARTHRITIS PAIN) 1 % GEL Apply 2 g topically 2 (two) times daily as needed.   diclofenac Sodium (VOLTAREN) 1 % GEL APPLY 4G TOPICALLY 4 TIMES DAILY   diphenhydrAMINE (BENADRYL ALLERGY) 25 mg capsule Please take 50 mg (2 tablets) 1 hour prior to your CT scan.   escitalopram (LEXAPRO) 20 MG tablet TAKE 1 TABLET(20 MG) BY MOUTH DAILY   furosemide (LASIX) 20 MG tablet Take 3 tablets (60 mg total) by mouth daily.   methocarbamol (ROBAXIN) 750 MG tablet Take 2 tablets (1,500 mg total) by mouth every 8 (eight) hours as needed for muscle spasms.    naproxen (NAPROSYN) 500 MG tablet Take 1 tablet (500 mg total) by mouth 2 (two) times daily with a meal.   potassium chloride (KLOR-CON) 10 MEQ tablet Take 1 tablet (10 mEq total) by mouth daily.   No facility-administered encounter medications on file as of 03/01/2023.    Allergies (verified) Contrast media [iodinated contrast media], Penicillins, and Sulfonamide derivatives   History: Past Medical History:  Diagnosis Date   Hyperlipidemia    Hypertension    Menopause syndrome    Past Surgical History:  Procedure Laterality Date   CHOLECYSTECTOMY N/A 04/05/2017   Procedure: LAPAROSCOPIC CHOLECYSTECTOMY;  Surgeon: Kinsinger, De Blanch, MD;  Location: [facility];  Service: General;  Laterality: N/A;   MOUTH SURGERY     TONSILLECTOMY     TRANSTHORACIC ECHOCARDIOGRAM  04/19/2019    EF 66 5%.  Mild LVH.  GR 1 DD.  Normal atrial sizes.  Mild aortic valve sclerosis but no stenosis.  Normal IVC.  Otherwise normal valves.   TUBAL LIGATION     Family History  Problem Relation Age of Onset   Heart disease Mother        "enlarged heart"   Hypertension Mother    Diabetes Mother    Heart disease Maternal Uncle  Bypass surg   Heart attack Maternal Grandmother        also had irregular heart beat requriring a pacemaker.   Hypertension Other        In multiple relatives.    Hypertension Paternal Grandmother    COPD Sister    Sarcoidosis Sister    Heart block Sister        s/p PPM   Hypertension Brother    Hypertension Sister    Arthritis Sister    COPD Sister    Diabetes Sister    Hypertension Sister    Arthritis Sister    Hypertension Brother    Healthy Brother    Healthy Brother    Healthy Sister    Breast cancer Neg Hx    Social History   Socioeconomic History   Marital status: Married    Spouse name: Not on file   Number of children: Not on file   Years of education: Not on file   Highest education level: Not on file  Occupational History   Not on file  Tobacco  Use   Smoking status: Former   Smokeless tobacco: Never   Tobacco comments:     1 PPD for 2-3 years. Quit ~9528.  Vaping Use   Vaping status: Never Used  Substance and Sexual Activity   Alcohol use: No   Drug use: No   Sexual activity: Yes    Birth control/protection: Surgical  Other Topics Concern   Not on file  Social History Narrative   Lives with husband. Good relationship, feels safe at home. Studying medical administration.       Current Social History 09/27/2018        Patient lives with spouse in an apartment on the second floor. There are 14 steps with handrails up to the entrance the patient uses.       Patient's method of transportation is personal car.      The highest level of education was high school diploma.      The patient currently is employed as a Engineer, site in patient's homes.      Identified important Relationships are "My husband, my kids, my grands and great grands."       Pets : None       Interests / Fun: Watching TV, going to beach and park, cooking out with family.       Current Stressors: "finances, teenagers" (grandkids)       Religious / Personal Beliefs: Methodist, "I was raised to treat people the way you want to be treated.          Social Determinants of Health   Financial Resource Strain: Low Risk  (03/01/2023)   Overall Financial Resource Strain (CARDIA)    Difficulty of Paying Living Expenses: Not hard at all  Food Insecurity: No Food Insecurity (03/01/2023)   Hunger Vital Sign    Worried About Running Out of Food in the Last Year: Never true    Ran Out of Food in the Last Year: Never true  Transportation Needs: No Transportation Needs (03/01/2023)   PRAPARE - Administrator, Civil Service (Medical): No    Lack of Transportation (Non-Medical): No  Physical Activity: Insufficiently Active (03/01/2023)   Exercise Vital Sign    Days of Exercise per Week: 4 days    Minutes of Exercise per Session: 30 min  Stress: No  Stress Concern Present (03/01/2023)   [employer] of Occupational Health - Occupational Stress Questionnaire  Feeling of Stress : Only a little  Social Connections: Moderately Integrated (03/01/2023)   Social Connection and Isolation Panel [NHANES]    Frequency of Communication with Friends and Family: More than three times a week    Frequency of Social Gatherings with Friends and Family: More than three times a week    Attends Religious Services: More than 4 times per year    Active Member of [employer] or Organizations: No    Attends Banker Meetings: Never    Marital Status: Married    Tobacco Counseling Counseling given: Not Answered Tobacco comments:  1 PPD for 2-3 years. Quit ~1610.   Clinical Intake:  Pre-visit preparation completed: Yes  Pain : 0-10 Pain Score: 4  Pain Type: Chronic pain Pain Location: Hip Pain Orientation: Right Pain Descriptors / Indicators: Aching Pain Onset: More than a month ago Pain Frequency: Intermittent Pain Relieving Factors: Aleve  Pain Relieving Factors: Aleve  Nutritional Risks: None Diabetes: No  How often do you need to have someone help you when you read instructions, pamphlets, or other written materials from your doctor or pharmacy?: 1 - Never What is the last grade level you completed in school?: college  Interpreter Needed?: No  Information entered by :: Sanna Porcaro,cma   Activities of Daily Living    03/01/2023   11:29 AM 03/01/2023    9:20 AM  In your present state of health, do you have any difficulty performing the following activities:  Hearing? 0 0  Vision? 0 0  Difficulty concentrating or making decisions? 0 0  Walking or climbing stairs? 0 0  Dressing or bathing? 0 0  Doing errands, shopping? 0 0    Patient Care Team: Reymundo Poll, MD as PCP - General (Internal Medicine) Marykay Lex, MD as PCP - Cardiology (Cardiology)  Indicate any recent Medical Services you may have received  from other than [facility] providers in the past year (date may be approximate).     Assessment:   This is a routine wellness examination for Artie.  Hearing/Vision screen No results found.  Dietary issues and exercise activities discussed:     Goals Addressed   None   Depression Screen    03/01/2023   11:29 AM 03/01/2023    9:46 AM 01/25/2023   11:11 AM 06/01/2022   12:01 PM 01/31/2022    9:36 AM 01/31/2022    9:00 AM 12/08/2021    9:39 AM  PHQ 2/9 Scores  PHQ - 2 Score 0 0 2 2 0 0 1  PHQ- 9 Score 0  8 3       Fall Risk    03/01/2023   11:30 AM 03/01/2023   11:29 AM 03/01/2023    9:20 AM 01/25/2023    9:51 AM 06/01/2022   10:42 AM  Fall Risk   Falls in the past year? 0 0 0 0 0  Number falls in past yr: 0 0 0 0 0  Injury with Fall? 0 0 0 0 0  Risk for fall due to : No Fall Risks No Fall Risks No Fall Risks No Fall Risks   Follow up Falls evaluation completed;Falls prevention discussed Falls evaluation completed;Falls prevention discussed Falls evaluation completed;Falls prevention discussed Falls evaluation completed;Falls prevention discussed Falls evaluation completed;Falls prevention discussed    MEDICARE RISK AT HOME:  Medicare Risk at Home - 03/01/23 1131     Any stairs in or around the home? No    If so, are there any without  handrails? No    Home free of loose throw rugs in walkways, pet beds, electrical cords, etc? Yes    Adequate lighting in your home to reduce risk of falls? Yes    Life alert? No    Use of a cane, walker or w/c? No    Grab bars in the bathroom? No    Shower chair or bench in shower? No    Elevated toilet seat or a handicapped toilet? No             TIMED UP AND GO:  Was the test performed?  No    Cognitive Function:        01/31/2022    9:39 AM  6CIT Screen  What Year? 0 points  What month? 0 points  What time? 0 points  Count back from 20 0 points  Months in reverse 0 points  Repeat phrase 0 points  Total Score 0 points     Immunizations Immunization History  Administered Date(s) Administered   Fluad Quad(high Dose 65+) 04/26/2021   Influenza Whole 05/14/2007, 06/23/2009, 06/23/2010, 03/17/2011   Influenza,inj,Quad PF,6+ Mos 04/25/2017, 08/20/2018, 03/20/2019   PFIZER(Purple Top)SARS-COV-2 Vaccination 09/07/2019, 10/01/2019, 05/30/2020   PPD Test 09/24/2018, 10/06/2020   Pneumococcal Conjugate-13 08/20/2018   Pneumococcal Polysaccharide-23 12/16/2020   Tdap 10/26/2010, 12/08/2021    TDAP status: Up to date  Flu Vaccine status: Due, Education has been provided regarding the importance of this vaccine. Advised may receive this vaccine at local pharmacy or Health Dept. Aware to provide a copy of the vaccination record if obtained from local pharmacy or Health Dept. Verbalized acceptance and understanding.  Pneumococcal vaccine status: Up to date  Covid-19 vaccine status: Completed vaccines  Qualifies for Shingles Vaccine? No   Zostavax completed No   Shingrix Completed?: No.    Education has been provided regarding the importance of this vaccine. Patient has been advised to call insurance company to determine out of pocket expense if they have not yet received this vaccine. Advised may also receive vaccine at local pharmacy or Health Dept. Verbalized acceptance and understanding.  Screening Tests Health Maintenance  Topic Date Due   Zoster Vaccines- Shingrix (1 of 2) Never done   COVID-19 Vaccine (4 - 2023-24 season) 03/18/2022   INFLUENZA VACCINE  02/16/2023   Medicare Annual Wellness (AWV)  02/29/2024   MAMMOGRAM  12/29/2024   DTaP/Tdap/Td (3 - Td or Tdap) 12/09/2031   Pneumonia Vaccine 8+ Years old  Completed   DEXA SCAN  Completed   Hepatitis C Screening  Completed   HPV VACCINES  Aged Out   COLON CANCER SCREENING ANNUAL FOBT  Discontinued   Colonoscopy  Discontinued    Health Maintenance  Health Maintenance Due  Topic Date Due   Zoster Vaccines- Shingrix (1 of 2) Never done    COVID-19 Vaccine (4 - 2023-24 season) 03/18/2022   INFLUENZA VACCINE  02/16/2023    Colorectal cancer screening: Type of screening: Colonoscopy. Completed 05/08/2020. Repeat every 0 years  Mammogram status: Completed 12/30/2022. Repeat every year:2  Bone Density status: Completed 12/18/2018. Results reflect: Bone density results: NORMAL. Repeat every 0 years.  Lung Cancer Screening: (Low Dose CT Chest recommended if Age 74-80 years, 20 pack-year currently smoking OR have quit w/in 15years.) does not qualify.   Lung Cancer Screening Referral: N/A  Additional Screening:  Hepatitis C Screening: does not qualify; Completed 09/27/2018  Vision Screening: Recommended annual ophthalmology exams for early detection of glaucoma and other disorders of the eye. Is  the patient up to date with their annual eye exam?  No  Who is the provider or what is the name of the office in which the patient attends annual eye exams? N/A If pt is not established with a provider, would they like to be referred to a provider to establish care? No .   Dental Screening: Recommended annual dental exams for proper oral hygiene    Community Resource Referral / Chronic Care Management: CRR required this visit?  No   CCM required this visit?  No     Plan:     I have personally reviewed and noted the following in the patient's chart:   Medical and social history Use of alcohol, tobacco or illicit drugs  Current medications and supplements including opioid prescriptions. Patient is currently taking opioid prescriptions. Information provided to patient regarding non-opioid alternatives. Patient advised to discuss non-opioid treatment plan with their provider. Functional ability and status Nutritional status Physical activity Advanced directives List of other physicians Hospitalizations, surgeries, and ER visits in previous 12 months Vitals Screenings to include cognitive, depression, and falls Referrals  and appointments  In addition, I have reviewed and discussed with patient certain preventive protocols, quality metrics, and best practice recommendations. A written personalized care plan for preventive services as well as general preventive health recommendations were provided to patient.     Cala Bradford, CMA   03/01/2023   After Visit Summary: (MyChart) Due to this being a telephonic visit, the after visit summary with patients personalized plan was offered to patient via MyChart   Nurse Notes: Face-To-Face Visit  Ms. Dealba , Thank you for taking time to come for your Medicare Wellness Visit. I appreciate your ongoing commitment to your health goals. Please review the following plan we discussed and let me know if I can assist you in the future.   These are the goals we discussed:  Goals       Blood Pressure < 140/90      Exercise 3x per week (30 min per time) (pt-stated)      15 minutes twice a day 3 days per week Will also look into restarting water aerobics.      LDL CALC < 130        This is a list of the screening recommended for you and due dates:  Health Maintenance  Topic Date Due   Zoster (Shingles) Vaccine (1 of 2) Never done   COVID-19 Vaccine (4 - 2023-24 season) 03/18/2022   Flu Shot  02/16/2023   Medicare Annual Wellness Visit  02/29/2024   Mammogram  12/29/2024   DTaP/Tdap/Td vaccine (3 - Td or Tdap) 12/09/2031   Pneumonia Vaccine  Completed   DEXA scan (bone density measurement)  Completed   Hepatitis C Screening  Completed   HPV Vaccine  Aged Out   Stool Blood Test  Discontinued   Colon Cancer Screening  Discontinued

## 2023-03-07 ENCOUNTER — Ambulatory Visit
Admission: EM | Admit: 2023-03-07 | Discharge: 2023-03-07 | Disposition: A | Discharge status: Home / Self Care | Payer: Medicare Other | Attending: Family Medicine | Admitting: Family Medicine

## 2023-03-07 ENCOUNTER — Encounter: Payer: Self-pay | Admitting: *Deleted

## 2023-03-07 ENCOUNTER — Ambulatory Visit: Payer: Medicare Other

## 2023-03-07 DIAGNOSIS — S66912A Strain of unspecified muscle, fascia and tendon at wrist and hand level, left hand, initial encounter: Secondary | ICD-10-CM

## 2023-03-07 DIAGNOSIS — M19032 Primary osteoarthritis, left wrist: Secondary | ICD-10-CM | POA: Diagnosis not present

## 2023-03-07 DIAGNOSIS — M79642 Pain in left hand: Secondary | ICD-10-CM | POA: Diagnosis not present

## 2023-03-07 DIAGNOSIS — M25532 Pain in left wrist: Secondary | ICD-10-CM

## 2023-03-07 DIAGNOSIS — M7989 Other specified soft tissue disorders: Secondary | ICD-10-CM | POA: Diagnosis not present

## 2023-03-07 DIAGNOSIS — S60222A Contusion of left hand, initial encounter: Secondary | ICD-10-CM | POA: Diagnosis not present

## 2023-03-07 DIAGNOSIS — M19042 Primary osteoarthritis, left hand: Secondary | ICD-10-CM | POA: Diagnosis not present

## 2023-03-07 MED ORDER — PREDNISONE 20 MG PO TABS: 20.0000 mg | ORAL_TABLET | Freq: Every day | ORAL | 0 refills | Status: AC

## 2023-03-07 NOTE — Discharge Instructions (Addendum)
As discussed will update you via MyChart once x-ray is read by. Start prednisone today and wear ace wrap during the daytime. If swelling or pain has not resolved within the next 3 days follow-up with [facility].

## 2023-03-07 NOTE — ED Triage Notes (Signed)
States she was pulling herself up with her left hand yesterday and "felt a pop". Swelling and bruising noted to posterior hand. Pt able to remove rings during intake

## 2023-03-07 NOTE — ED Provider Notes (Signed)
[facility]    CSN: [id number] Arrival date & time: 03/07/23  1041      History   Chief Complaint Chief Complaint  Patient presents with   Hand Pain    HPI Katherine Haynes is a 72 y.o. female.   HPI Patient here for evaluation of left hand swelling.  Patient orts that on yesterday she was climbing up onto a shelf and used her left hand to brace herself while she was reaching for an object with her right hand and noticed immediately a popping sensation left hand and immediately felt pain which radiated into her left wrist.  Overnight the swelling in her hand and wrist worsen.  She also has localized discoloration and swelling below the 2-3 digits of the left hand. She maintains ROM however, hand and finger stiff due to swelling. She continues to endorses intact sensation of left hand and left wrist. Past Medical History:  Diagnosis Date   Hyperlipidemia    Hypertension    Menopause syndrome     Patient Active Problem List   Diagnosis Date Noted   Claudication (HCC) 03/01/2023   Trochanteric bursitis of right hip 01/25/2023   SI (sacroiliac) joint dysfunction 06/01/2022   Screening mammogram for breast cancer 12/08/2021   BPPV (benign paroxysmal positional vertigo) 12/08/2021   Major depression 08/04/2021   Need for shingles vaccine 06/23/2021   Cervical myelopathy with cervical radiculopathy (HCC) 11/18/2020   LBBB (left bundle branch block) 10/26/2020   Vasomotor symptoms due to menopause 01/07/2019   Lymphedema 12/11/2018   Insomnia 09/11/2017   Status post cholecystectomy 05/01/2017   Healthcare maintenance 04/25/2017   Seasonal allergies 10/07/2010   Hyperlipidemia 05/14/2007   Essential hypertension 05/25/2006    Past Surgical History:  Procedure Laterality Date   CHOLECYSTECTOMY N/A 04/05/2017   Procedure: LAPAROSCOPIC CHOLECYSTECTOMY;  Surgeon: Kinsinger, De Blanch, MD;  Location: [facility];  Service: General;  Laterality: N/A;   MOUTH SURGERY      TONSILLECTOMY     TRANSTHORACIC ECHOCARDIOGRAM  04/19/2019    EF 66 5%.  Mild LVH.  GR 1 DD.  Normal atrial sizes.  Mild aortic valve sclerosis but no stenosis.  Normal IVC.  Otherwise normal valves.   TUBAL LIGATION      OB History     Gravida  3   Para      Term      Preterm      AB      Living  3      SAB      IAB      Ectopic      Multiple      Live Births  3            Home Medications    Prior to Admission medications   Medication Sig Start Date End Date Taking? Authorizing Provider  predniSONE (DELTASONE) 20 MG tablet Take 1 tablet (20 mg total) by mouth daily with breakfast for 5 days. 03/07/23 03/12/23 Yes Bing Neighbors, NP  amLODipine (NORVASC) 10 MG tablet TAKE 1 TABLET(10 MG) BY MOUTH DAILY 01/09/23   Miguel Aschoff, MD  atorvastatin (LIPITOR) 40 MG tablet TAKE 1 TABLET(40 MG) BY MOUTH DAILY 02/20/23   Reymundo Poll, MD  diclofenac Sodium (VOLTAREN ARTHRITIS PAIN) 1 % GEL Apply 2 g topically 2 (two) times daily as needed. 01/31/22   Doran Stabler, DO  diclofenac Sodium (VOLTAREN) 1 % GEL APPLY 4G TOPICALLY 4 TIMES DAILY 02/10/23   Reymundo Poll,  MD  diphenhydrAMINE (BENADRYL ALLERGY) 25 mg capsule Please take 50 mg (2 tablets) 1 hour prior to your CT scan. 08/16/21   Reymundo Poll, MD  escitalopram (LEXAPRO) 20 MG tablet TAKE 1 TABLET(20 MG) BY MOUTH DAILY 02/03/23   Reymundo Poll, MD  furosemide (LASIX) 20 MG tablet Take 3 tablets (60 mg total) by mouth daily. 03/01/23   Reymundo Poll, MD  methocarbamol (ROBAXIN) 750 MG tablet Take 2 tablets (1,500 mg total) by mouth every 8 (eight) hours as needed for muscle spasms. 08/08/22   Reymundo Poll, MD  naproxen (NAPROSYN) 500 MG tablet Take 1 tablet (500 mg total) by mouth 2 (two) times daily with a meal. 12/08/21   Reymundo Poll, MD  potassium chloride (KLOR-CON) 10 MEQ tablet Take 1 tablet (10 mEq total) by mouth daily. 04/28/22   Reymundo Poll, MD    Family  History Family History  Problem Relation Age of Onset   Heart disease Mother        "enlarged heart"   Hypertension Mother    Diabetes Mother    Heart disease Maternal Uncle        Bypass surg   Heart attack Maternal Grandmother        also had irregular heart beat requriring a pacemaker.   Hypertension Other        In multiple relatives.    Hypertension Paternal Grandmother    COPD Sister    Sarcoidosis Sister    Heart block Sister        s/p PPM   Hypertension Brother    Hypertension Sister    Arthritis Sister    COPD Sister    Diabetes Sister    Hypertension Sister    Arthritis Sister    Hypertension Brother    Healthy Brother    Healthy Brother    Healthy Sister    Breast cancer Neg Hx     Social History Social History   Tobacco Use   Smoking status: Former   Smokeless tobacco: Never   Tobacco comments:     1 PPD for 2-3 years. Quit ~1610.  Vaping Use   Vaping status: Never Used  Substance Use Topics   Alcohol use: No   Drug use: No     Allergies   Contrast media [iodinated contrast media], Penicillins, and Sulfonamide derivatives  Review of Systems Review of Systems Pertinent negatives listed in HPI   Physical Exam Triage Vital Signs ED Triage Vitals  Encounter Vitals Group     BP 03/07/23 1118 134/73     Systolic BP Percentile --      Diastolic BP Percentile --      Pulse Rate 03/07/23 1118 77     Resp 03/07/23 1118 18     Temp 03/07/23 1118 98.3 F (36.8 C)     Temp Source 03/07/23 1118 Oral     SpO2 03/07/23 1118 96 %     Weight --      Height --      Head Circumference --      Peak Flow --      Pain Score 03/07/23 1119 7     Pain Loc --      Pain Education --      Exclude from Growth Chart --    No data found.  Updated Vital Signs BP 134/73 (BP Location: Right Arm)   Pulse 77   Temp 98.3 F (36.8 C) (Oral)   Resp 18   LMP 10/07/1990  SpO2 96%   Visual Acuity Right Eye Distance:   Left Eye Distance:   Bilateral  Distance:    Right Eye Near:   Left Eye Near:    Bilateral Near:     Physical Exam Vitals reviewed.  Constitutional:      Appearance: Normal appearance.  Eyes:     Extraocular Movements: Extraocular movements intact.     Conjunctiva/sclera: Conjunctivae normal.     Pupils: Pupils are equal, round, and reactive to light.  Cardiovascular:     Rate and Rhythm: Normal rate and regular rhythm.  Pulmonary:     Effort: Pulmonary effort is normal.     Breath sounds: Normal breath sounds.  Musculoskeletal:     Left hand: Swelling, tenderness and bony tenderness present.       Arms:     Cervical back: Normal range of motion and neck supple.  Neurological:     General: No focal deficit present.     Mental Status: She is alert.      UC Treatments / Results  Labs (all labs ordered are listed, but only abnormal results are displayed) Labs Reviewed - No data to display  EKG   Radiology DG Hand Complete Left  Result Date: 03/07/2023 CLINICAL DATA:  Left hand and wrist pain EXAM: LEFT HAND - COMPLETE 3+ VIEW; LEFT WRIST - COMPLETE 3+ VIEW COMPARISON:  None Available. FINDINGS: Bones are demineralized. There is no evidence of fracture or dislocation of the left hand or wrist. Mild osteoarthritic changes throughout. Soft tissue swelling is present over the dorsum of the hand. IMPRESSION: Soft tissue swelling over the dorsum of the hand. No acute fracture or dislocation of the left hand or wrist. Electronically Signed   By: Duanne Guess D.O.   On: 03/07/2023 13:23   DG Wrist Complete Left  Result Date: 03/07/2023 CLINICAL DATA:  Left hand and wrist pain EXAM: LEFT HAND - COMPLETE 3+ VIEW; LEFT WRIST - COMPLETE 3+ VIEW COMPARISON:  None Available. FINDINGS: Bones are demineralized. There is no evidence of fracture or dislocation of the left hand or wrist. Mild osteoarthritic changes throughout. Soft tissue swelling is present over the dorsum of the hand. IMPRESSION: Soft tissue swelling  over the dorsum of the hand. No acute fracture or dislocation of the left hand or wrist. Electronically Signed   By: Duanne Guess D.O.   On: 03/07/2023 13:23    Procedures Procedures (including critical care time)  Medications Ordered in UC Medications - No data to display  Initial Impression / Assessment and Plan / UC Course  I have reviewed the triage vital signs and the nursing notes.  Pertinent labs & imaging results that were available during my care of the patient were reviewed by me and considered in my medical decision making (see chart for details).    Contusion of the left hand with muscle strain of the left wrist, imaging unremarkable.  Will treat with prednisone 20 mg x 5 days along with compression with an ACE Wrap. Patient encouraged to follow-up with [facility] if symptoms have not improved with prescribed treatment. Final Clinical Impressions(s) / UC Diagnoses   Final diagnoses:  Contusion of dorsum of left hand  Muscle strain of left wrist, initial encounter     Discharge Instructions      As discussed will update you via MyChart once x-ray is read by. Start prednisone today and wear ace wrap during the daytime. If swelling or pain has not resolved within the next 3  days follow-up with [facility].      ED Prescriptions     Medication Sig Dispense Auth. Provider   predniSONE (DELTASONE) 20 MG tablet Take 1 tablet (20 mg total) by mouth daily with breakfast for 5 days. 5 tablet Bing Neighbors, NP      PDMP not reviewed this encounter.   Bing Neighbors, NP 03/07/23 [phone]

## 2023-03-16 ENCOUNTER — Ambulatory Visit (HOSPITAL_COMMUNITY)
Admission: RE | Admit: 2023-03-16 | Discharge: 2023-03-16 | Disposition: A | Discharge status: Home / Self Care | Payer: Medicare Other | Source: Ambulatory Visit | Attending: Internal Medicine | Admitting: Internal Medicine

## 2023-03-16 DIAGNOSIS — I739 Peripheral vascular disease, unspecified: Secondary | ICD-10-CM | POA: Diagnosis not present

## 2023-03-16 NOTE — Progress Notes (Signed)
Ankle-brachial index completed. Please see CV Procedures for preliminary results.  Shona Simpson, RVT 03/16/23 10:14 AM

## 2023-03-17 LAB — VAS US ABI WITH/WO TBI
Left ABI: 1.18
Right ABI: 1.17

## 2023-04-26 ENCOUNTER — Ambulatory Visit: Payer: Medicare Other | Admitting: Student

## 2023-04-26 ENCOUNTER — Encounter: Payer: Self-pay | Admitting: Student

## 2023-04-26 ENCOUNTER — Other Ambulatory Visit: Payer: Self-pay | Admitting: Student

## 2023-04-26 ENCOUNTER — Ambulatory Visit (HOSPITAL_COMMUNITY)
Admission: RE | Admit: 2023-04-26 | Discharge: 2023-04-26 | Disposition: A | Discharge status: Home / Self Care | Payer: Medicare Other | Source: Ambulatory Visit | Attending: Internal Medicine | Admitting: Internal Medicine

## 2023-04-26 VITALS — BP 135/61 | HR 70 | Temp 98.7°F | Ht 66.0 in | Wt 167.7 lb

## 2023-04-26 DIAGNOSIS — Z23 Encounter for immunization: Secondary | ICD-10-CM

## 2023-04-26 DIAGNOSIS — R0781 Pleurodynia: Secondary | ICD-10-CM

## 2023-04-26 DIAGNOSIS — I10 Essential (primary) hypertension: Secondary | ICD-10-CM | POA: Diagnosis not present

## 2023-04-26 DIAGNOSIS — M25551 Pain in right hip: Secondary | ICD-10-CM | POA: Diagnosis not present

## 2023-04-26 DIAGNOSIS — I739 Peripheral vascular disease, unspecified: Secondary | ICD-10-CM

## 2023-04-26 DIAGNOSIS — M7631 Iliotibial band syndrome, right leg: Secondary | ICD-10-CM | POA: Diagnosis not present

## 2023-04-26 DIAGNOSIS — G4700 Insomnia, unspecified: Secondary | ICD-10-CM | POA: Diagnosis not present

## 2023-04-26 DIAGNOSIS — E782 Mixed hyperlipidemia: Secondary | ICD-10-CM

## 2023-04-26 DIAGNOSIS — F5101 Primary insomnia: Secondary | ICD-10-CM

## 2023-04-26 DIAGNOSIS — M8580 Other specified disorders of bone density and structure, unspecified site: Secondary | ICD-10-CM | POA: Diagnosis not present

## 2023-04-26 DIAGNOSIS — M7061 Trochanteric bursitis, right hip: Secondary | ICD-10-CM

## 2023-04-26 DIAGNOSIS — E785 Hyperlipidemia, unspecified: Secondary | ICD-10-CM

## 2023-04-26 NOTE — Assessment & Plan Note (Signed)
This is a pleasant 72 year old female who presents to clinic today about her chronic right hip pain.  Patient was seen previously in the clinic with similar pain.  Patient reports her pain has been going on for the past 6 months, it is constant and dull in nature.  Patient reports sometimes it is sharp pain when she walks.  Patient reports the pain is worse with movement and changing position from sitting to standing.  Patient reports that the pain starts at the right lateral lower gluteal fold and radiates down the lateral right thigh to her right knee.  Patient has tried over-the-counter topical gels, ice and heat with momentarily relief.  Patient reports she takes 1 Advil in the morning and 1 Advil in the evening that makes the pain tolerable.  Patient has not had imaging of the right hip in the past.  Patient is now reporting that there is some weakness of the right lower extremity comparing to left lower extremity.  Patient reports that now she is stumbling and and had a near fall last week.  However patient denies any numbness or tingling sensation.  Her gait is not impacted.  Per the physical exam there is tenderness along the iliotibial band and head of the greater trochanter.  She also has tenderness that is present at the right pubic ischium and right inferior gluteals medius muscle. -Will get an imaging of the right hip -Will send a referral to sports medicine as her pain is worsening and affecting her gait -Will send a referral to physical therapist  -Patient is advised to currently continue current conservative treatment.  I will call and notify her about the imaging of the right hip.  Otherwise if she should be receiving a call from sports medicine and physical therapist.  Of note patient does report that she is not taking Robaxin, she was taking it previously but it made her feel out of balance.  Patient is advised to take over-the-counter Tylenol as needed for pain control.

## 2023-04-26 NOTE — Progress Notes (Signed)
Established Patient Office Visit  Subjective   Patient ID: Katherine Haynes, female    DOB: 1950/12/27  Age: 72 y.o. MRN: [id number]  Chief Complaint  Patient presents with   Hip Pain    Right hip    HPI  This is a 72 year old female living with a history stated below and presents today for Pain in the R hip. Please see problem based assessment and plan for additional details.   Patient Active Problem List   Diagnosis Date Noted   Claudication [facility]) 03/01/2023   Trochanteric bursitis of right hip and ITB syndrome 01/25/2023   SI (sacroiliac) joint dysfunction 06/01/2022   Screening mammogram for breast cancer 12/08/2021   BPPV (benign paroxysmal positional vertigo) 12/08/2021   Major depression 08/04/2021   Need for shingles vaccine 06/23/2021   Cervical myelopathy with cervical radiculopathy (HCC) 11/18/2020   LBBB (left bundle branch block) 10/26/2020   Vasomotor symptoms due to menopause 01/07/2019   Lymphedema 12/11/2018   Insomnia 09/11/2017   Status post cholecystectomy 05/01/2017   Healthcare maintenance 04/25/2017   Seasonal allergies 10/07/2010   Hyperlipidemia 05/14/2007   Essential hypertension 05/25/2006   Past Medical History:  Diagnosis Date   Hyperlipidemia    Hypertension    Menopause syndrome    Past Surgical History:  Procedure Laterality Date   CHOLECYSTECTOMY N/A 04/05/2017   Procedure: LAPAROSCOPIC CHOLECYSTECTOMY;  Surgeon: Sheliah Hatch De Blanch, MD;  Location: [facility];  Service: General;  Laterality: N/A;   MOUTH SURGERY     TONSILLECTOMY     TRANSTHORACIC ECHOCARDIOGRAM  04/19/2019    EF 66 5%.  Mild LVH.  GR 1 DD.  Normal atrial sizes.  Mild aortic valve sclerosis but no stenosis.  Normal IVC.  Otherwise normal valves.   TUBAL LIGATION     Social History   Socioeconomic History   Marital status: Married    Spouse name: Not on file   Number of children: Not on file   Years of education: Not on file   Highest education level: Not on  file  Occupational History   Not on file  Tobacco Use   Smoking status: Former   Smokeless tobacco: Never   Tobacco comments:     1 PPD for 2-3 years. Quit ~4098.  Vaping Use   Vaping status: Never Used  Substance and Sexual Activity   Alcohol use: No   Drug use: No   Sexual activity: Yes    Birth control/protection: Surgical  Other Topics Concern   Not on file  Social History Narrative   Lives with husband. Good relationship, feels safe at home. Studying medical administration.       Current Social History 09/27/2018        Patient lives with spouse in an apartment on the second floor. There are 14 steps with handrails up to the entrance the patient uses.       Patient's method of transportation is personal car.      The highest level of education was high school diploma.      The patient currently is employed as a Engineer, site in patient's homes.      Identified important Relationships are "My husband, my kids, my grands and great grands."       Pets : None       Interests / Fun: Watching TV, going to beach and park, cooking out with family.       Current Stressors: "finances, teenagers" (grandkids)  Religious / Personal Beliefs: [employer], "I was raised to treat people the way you want to be treated.          Social Determinants of Health   Financial Resource Strain: Low Risk  (03/01/2023)   Overall Financial Resource Strain (CARDIA)    Difficulty of Paying Living Expenses: Not hard at all  Food Insecurity: No Food Insecurity (03/01/2023)   Hunger Vital Sign    Worried About Running Out of Food in the Last Year: Never true    Ran Out of Food in the Last Year: Never true  Transportation Needs: No Transportation Needs (03/01/2023)   PRAPARE - Administrator, Civil Service (Medical): No    Lack of Transportation (Non-Medical): No  Physical Activity: Insufficiently Active (03/01/2023)   Exercise Vital Sign    Days of Exercise per Week: 4 days     Minutes of Exercise per Session: 30 min  Stress: No Stress Concern Present (03/01/2023)   [employer] of Occupational Health - Occupational Stress Questionnaire    Feeling of Stress : Only a little  Social Connections: Moderately Integrated (03/01/2023)   Social Connection and Isolation Panel [NHANES]    Frequency of Communication with Friends and Family: More than three times a week    Frequency of Social Gatherings with Friends and Family: More than three times a week    Attends Religious Services: More than 4 times per year    Active Member of [employer] or Organizations: No    Attends Banker Meetings: Never    Marital Status: Married  Catering manager Violence: Not At Risk (03/01/2023)   Humiliation, Afraid, Rape, and Kick questionnaire    Fear of Current or Ex-Partner: No    Emotionally Abused: No    Physically Abused: No    Sexually Abused: No   Family Status  Relation Name Status   Mother  Deceased at age 88       Blood clot   Mat Uncle  (Not Specified)   MGM  (Not Specified)   Other  (Not Specified)   PGM  (Not Specified)   Father  Alive, age 90y   Sister  Alive   Brother  Alive   Sister  Alive   Sister  Alive   Brother  Alive   Brother  Alive   Brother  Alive   Brother  Deceased at age at birth   Sister  Alive   Neg Hx  (Not Specified)  No partnership data on file   Family History  Problem Relation Age of Onset   Heart disease Mother        "enlarged heart"   Hypertension Mother    Diabetes Mother    Heart disease Maternal Uncle        Bypass surg   Heart attack Maternal Grandmother        also had irregular heart beat requriring a pacemaker.   Hypertension Other        In multiple relatives.    Hypertension Paternal Grandmother    COPD Sister    Sarcoidosis Sister    Heart block Sister        s/p PPM   Hypertension Brother    Hypertension Sister    Arthritis Sister    COPD Sister    Diabetes Sister    Hypertension Sister     Arthritis Sister    Hypertension Brother    Healthy Brother    Healthy Brother  Healthy Sister    Breast cancer Neg Hx    Allergies  Allergen Reactions   Contrast Media [Iodinated Contrast Media] Itching   Penicillins Hives   Sulfonamide Derivatives Itching    Review of Systems  Constitutional:  Negative for chills and fever.  HENT:  Negative for congestion and sore throat.   Eyes:  Negative for blurred vision.  Respiratory:  Negative for cough and shortness of breath.   Cardiovascular:  Negative for chest pain and claudication.  Gastrointestinal:  Negative for abdominal pain, nausea and vomiting.  Musculoskeletal:  Positive for joint pain.  Skin:  Negative for rash.  Neurological:  Positive for weakness. Negative for dizziness and headaches.      Objective:     BP 135/61 (BP Location: Right Arm, Patient Position: Sitting, Cuff Size: Normal)   Pulse 70   Temp 98.7 F (37.1 C) (Oral)   Ht 5\' 6"  (1.676 m)   Wt 167 lb 11.2 oz (76.1 kg)   LMP 10/07/1990   SpO2 100%   BMI 27.07 kg/m  BP Readings from Last 3 Encounters:  04/26/23 135/61  03/07/23 134/73  03/01/23 (!) 130/55   Wt Readings from Last 3 Encounters:  04/26/23 167 lb 11.2 oz (76.1 kg)  03/01/23 165 lb 3.2 oz (74.9 kg)  03/01/23 165 lb 3.2 oz (74.9 kg)    Physical Exam Musculoskeletal:     Right lower leg: Tenderness present.       Legs:     Comments: Positive tenderness to palpation Right infero-lateral ischium, head of the greater trochanter and long the IT band. Pain reproduced by Abduction of the Right hip.     Constitutional: well-appearing sitting in Chair, in no acute distress HENT: normocephalic atraumatic, mucous membranes moist Cardiovascular: regular rate and rhythm, no m/r/g, no lower extremity edema bilaterally Pulmonary/Chest: normal work of breathing on room air, lungs clear to auscultation bilaterally Abdominal: soft, non-tender, non-distended  No results found for any visits on  04/26/23.  Last CBC Lab Results  Component Value Date   WBC 5.1 08/04/2021   HGB 13.2 08/04/2021   HCT 38.5 08/04/2021   MCV 91 08/04/2021   MCH 31.1 08/04/2021   RDW 12.0 08/04/2021   PLT 239 08/04/2021   Last metabolic panel Lab Results  Component Value Date   GLUCOSE 91 01/25/2023   NA 141 01/25/2023   K 4.6 01/25/2023   CL 103 01/25/2023   CO2 24 01/25/2023   BUN 16 01/25/2023   CREATININE 1.03 (H) 01/25/2023   EGFR 58 (L) 01/25/2023   CALCIUM 9.1 01/25/2023   PROT 7.5 06/01/2022   ALBUMIN 4.2 06/01/2022   LABGLOB 3.3 06/01/2022   AGRATIO 1.3 06/01/2022   BILITOT 0.5 06/01/2022   ALKPHOS 115 06/01/2022   AST 65 (H) 06/01/2022   ALT 51 (H) 06/01/2022   ANIONGAP 10 02/09/2021   Last lipids Lab Results  Component Value Date   CHOL 150 01/25/2023   HDL 70 01/25/2023   LDLCALC 63 01/25/2023   TRIG 95 01/25/2023   CHOLHDL 2.1 01/25/2023   Last hemoglobin A1c Lab Results  Component Value Date   HGBA1C 6.0 11/22/2012    The 10-year ASCVD risk score (Arnett DK, et al., 2019) is: 10.5%    Assessment & Plan:   Problem List Items Addressed This Visit       Cardiovascular and Mediastinum   Essential hypertension (Chronic)    BP Readings from Last 3 Encounters:  04/26/23 135/61  03/07/23 134/73  03/01/23 Marland Kitchen)  130/55  Patient takes Amlodipine 10 mg daily. Blood pressure is well controlled.         Musculoskeletal and Integument   Trochanteric bursitis of right hip and ITB syndrome    This is a pleasant 72 year old female who presents to clinic today about her chronic right hip pain.  Patient was seen previously in the clinic with similar pain.  Patient reports her pain has been going on for the past 6 months, it is constant and dull in nature.  Patient reports sometimes it is sharp pain when she walks.  Patient reports the pain is worse with movement and changing position from sitting to standing.  Patient reports that the pain starts at the right lateral  lower gluteal fold and radiates down the lateral right thigh to her right knee.  Patient has tried over-the-counter topical gels, ice and heat with momentarily relief.  Patient reports she takes 1 Advil in the morning and 1 Advil in the evening that makes the pain tolerable.  Patient has not had imaging of the right hip in the past.  Patient is now reporting that there is some weakness of the right lower extremity comparing to left lower extremity.  Patient reports that now she is stumbling and and had a near fall last week.  However patient denies any numbness or tingling sensation.  Her gait is not impacted.  Per the physical exam there is tenderness along the iliotibial band and head of the greater trochanter.  She also has tenderness that is present at the right pubic ischium and right inferior gluteals medius muscle. -Will get an imaging of the right hip -Will send a referral to sports medicine as her pain is worsening and affecting her gait -Will send a referral to physical therapist  -Patient is advised to currently continue current conservative treatment.  I will call and notify her about the imaging of the right hip.  Otherwise if she should be receiving a call from sports medicine and physical therapist.  Of note patient does report that she is not taking Robaxin, she was taking it previously but it made her feel out of balance.  Patient is advised to take over-the-counter Tylenol as needed for pain control.        Other   Hyperlipidemia (Chronic)    Lab Results  Component Value Date   LDLCALC 63 01/25/2023   Last LDL at goal for primary prevention.  She will continue 40 mg of Lipitor daily.  No changes made.      Insomnia    Patient is doing well with her Lexapro and over-the-counter melatonin.  Will continue the current regimen.      Claudication (HCC)    ABIs 03/16/2023 normal bilaterally.  No other concern of DVT at this point.      Other Visit Diagnoses     Encounter for  immunization    -  Primary   Relevant Orders   Flu Vaccine Trivalent High Dose (Fluad) (Completed)   Rib pain on right side       Relevant Orders   Ambulatory referral to Physical Therapy   Ambulatory referral to Sports Medicine       Return in about 2 months (around 06/26/2023) for Routine follow up.    Jeral Pinch, DO

## 2023-04-26 NOTE — Patient Instructions (Signed)
Thank you, Ms.Myrtha Mantis for allowing [place] to provide your care today. Today we discussed:  Right Hip Pain - Imaging of the Right Hip - Referral to Sports Medicine - Referral to physical therapist  - Continue current regimen, Ice/ heating pads, OTC topical gels.   I have ordered the following labs for you:  Lab Orders  No laboratory test(s) ordered today     Tests ordered today:  None   Referrals ordered today:    Referral Orders         Ambulatory referral to Physical Therapy         Ambulatory referral to Sports Medicine      I have ordered the following medication/changed the following medications:   Stop the following medications: There are no discontinued medications.   Start the following medications: No orders of the defined types were placed in this encounter.    Follow up:  2 -3 months as needed       Remember:   Should you have any questions or concerns please call the internal medicine clinic at [phone].     Jeral Pinch, DO [facility]

## 2023-04-26 NOTE — Assessment & Plan Note (Signed)
Lab Results  Component Value Date   LDLCALC 63 01/25/2023   Last LDL at goal for primary prevention.  She will continue 40 mg of Lipitor daily.  No changes made.

## 2023-04-26 NOTE — Assessment & Plan Note (Signed)
BP Readings from Last 3 Encounters:  04/26/23 135/61  03/07/23 134/73  03/01/23 (!) 130/55  Patient takes Amlodipine 10 mg daily. Blood pressure is well controlled.

## 2023-04-26 NOTE — Assessment & Plan Note (Signed)
Patient is doing well with her Lexapro and over-the-counter melatonin.  Will continue the current regimen.

## 2023-04-26 NOTE — Assessment & Plan Note (Signed)
ABIs 03/16/2023 normal bilaterally.  No other concern of DVT at this point.

## 2023-05-03 NOTE — Addendum Note (Signed)
Addended by: Earl Lagos on: 05/03/2023 10:14 AM   Modules accepted: Level of Service

## 2023-05-03 NOTE — Addendum Note (Signed)
Addended by: Earl Lagos on: 05/03/2023 10:16 AM   Modules accepted: Level of Service

## 2023-05-03 NOTE — Progress Notes (Signed)
Internal Medicine Clinic Attending  I was physically present during the key portions of the resident provided service and participated in the medical decision making of patient's management care. I reviewed pertinent patient test results.  The assessment, diagnosis, and plan were formulated together and I agree with the documentation in the resident's note.  Narendra, Nischal, MD  

## 2023-05-10 ENCOUNTER — Encounter: Payer: Self-pay | Admitting: Internal Medicine

## 2023-05-15 ENCOUNTER — Encounter: Payer: Self-pay | Admitting: Internal Medicine

## 2023-05-15 DIAGNOSIS — E876 Hypokalemia: Secondary | ICD-10-CM

## 2023-05-15 DIAGNOSIS — M7061 Trochanteric bursitis, right hip: Secondary | ICD-10-CM

## 2023-05-15 MED ORDER — POTASSIUM CHLORIDE ER 10 MEQ PO TBCR
10.0000 meq | EXTENDED_RELEASE_TABLET | Freq: Every day | ORAL | 3 refills | Status: DC
Start: 2023-05-15 — End: 2023-10-16

## 2023-05-15 MED ORDER — HYDROCODONE-ACETAMINOPHEN 5-325 MG PO TABS: 1.0000 | ORAL_TABLET | Freq: Two times a day (BID) | ORAL | 0 refills | Status: DC | PRN

## 2023-05-15 NOTE — Telephone Encounter (Signed)
Spoke with patient about on going issues with severe right hip pain that has become function limiting and interfering with her sleep. She has maximized non opioid therapies unfortunately is not improving. She was seen by one of our residents for this on 10/9 and referred to PT and sports medicine which she was agreeable to but has not received a call to schedule. I see the physical therapy referral is pending review, I do not see a referral order for sports medicine. I evaluated her for this pain on 8/14 and was concerned for trochanteric bursitis at the time. I will place a referral to sports medicine and provide a short term course of Norco while we await scheduling. Reviewed PDMP, she knows to use this sparingly and is aware this will be short term prescription. I also informed her I will be out of the office until Thursday this week but to call back at that time if she has not heard about the referrals.

## 2023-05-18 ENCOUNTER — Ambulatory Visit: Payer: Medicare Other | Admitting: Family Medicine

## 2023-05-18 ENCOUNTER — Encounter: Payer: Self-pay | Admitting: Family Medicine

## 2023-05-18 VITALS — BP 130/74 | Ht 65.0 in | Wt 167.0 lb

## 2023-05-18 DIAGNOSIS — M25551 Pain in right hip: Secondary | ICD-10-CM

## 2023-05-18 MED ORDER — METHYLPREDNISOLONE ACETATE 80 MG/ML IJ SUSP
80.0000 mg | Freq: Once | INTRAMUSCULAR | Status: AC
  Administered 2023-05-18: 80 mg via INTRA_ARTICULAR

## 2023-05-18 NOTE — Patient Instructions (Signed)
You have greater trochanteric pain syndrome Avoid painful activities as much as possible. Ice over area of pain 3-4 times a day for 15 minutes at a time Hip side raise exercise 3 sets of 10 once a day - add weights if this becomes too easy. Stretches - pick 2-3 and hold for 20-30 seconds x 3 - do once or twice a day. You were given an injection today. Start physical therapy and do home exercises on days you don't go to therapy. Follow up with me in 6 weeks.

## 2023-05-18 NOTE — Progress Notes (Signed)
PCP: Reymundo Poll, MD  Subjective:   HPI: Patient is a 72 y.o. female here for right hip pain.  Patient denies known injury or trauma. She states for about 3 months she's had worsening lateral right hip pain. Pain can radiate into groin at times. No numbness/tingling. No back pain. No bowel/bladder dysfunction. Radiographs performed 10/9 independently reviewed today with minimal arthropathy of her right hip. Physical therapy has been ordered but due to start in 2 weeks.  Past Medical History:  Diagnosis Date   Hyperlipidemia    Hypertension    Menopause syndrome     Current Outpatient Medications on File Prior to Visit  Medication Sig Dispense Refill   amLODipine (NORVASC) 10 MG tablet TAKE 1 TABLET(10 MG) BY MOUTH DAILY 90 tablet 3   atorvastatin (LIPITOR) 40 MG tablet TAKE 1 TABLET(40 MG) BY MOUTH DAILY 90 tablet 3   diclofenac Sodium (VOLTAREN ARTHRITIS PAIN) 1 % GEL Apply 2 g topically 2 (two) times daily as needed. 100 g 0   diclofenac Sodium (VOLTAREN) 1 % GEL APPLY 4G TOPICALLY 4 TIMES DAILY 100 g 2   diphenhydrAMINE (BENADRYL ALLERGY) 25 mg capsule Please take 50 mg (2 tablets) 1 hour prior to your CT scan. 30 capsule 0   escitalopram (LEXAPRO) 20 MG tablet TAKE 1 TABLET(20 MG) BY MOUTH DAILY 90 tablet 3   furosemide (LASIX) 20 MG tablet Take 3 tablets (60 mg total) by mouth daily. 270 tablet 1   HYDROcodone-acetaminophen (NORCO) 5-325 MG tablet Take 1-2 tablets by mouth 2 (two) times daily as needed for moderate pain (pain score 4-6). 20 tablet 0   methocarbamol (ROBAXIN) 750 MG tablet Take 2 tablets (1,500 mg total) by mouth every 8 (eight) hours as needed for muscle spasms. 180 tablet 1   naproxen (NAPROSYN) 500 MG tablet Take 1 tablet (500 mg total) by mouth 2 (two) times daily with a meal. 60 tablet 2   potassium chloride (KLOR-CON) 10 MEQ tablet Take 1 tablet (10 mEq total) by mouth daily. 90 tablet 3   No current facility-administered medications on file prior  to visit.    Past Surgical History:  Procedure Laterality Date   CHOLECYSTECTOMY N/A 04/05/2017   Procedure: LAPAROSCOPIC CHOLECYSTECTOMY;  Surgeon: Kinsinger, De Blanch, MD;  Location: [facility];  Service: General;  Laterality: N/A;   MOUTH SURGERY     TONSILLECTOMY     TRANSTHORACIC ECHOCARDIOGRAM  04/19/2019    EF 66 5%.  Mild LVH.  GR 1 DD.  Normal atrial sizes.  Mild aortic valve sclerosis but no stenosis.  Normal IVC.  Otherwise normal valves.   TUBAL LIGATION      Allergies  Allergen Reactions   Contrast Media [Iodinated Contrast Media] Itching   Penicillins Hives   Sulfonamide Derivatives Itching    BP 130/74   Ht 5\' 5"  (1.651 m)   Wt 167 lb (75.8 kg)   LMP 10/07/1990   BMI 27.79 kg/m       No data to display              No data to display              Objective:  Physical Exam:  Gen: NAD, comfortable in exam room  Right hip: No deformity. Full range of motion with 5/5 strength except 4/5 hip abduction with mild pain Tenderness to palpation over greater trochanter.  No other tenderness. Neurovascularly intact distally. Negative logroll Negative faber, fadir, and piriformis stretches.    Assessment &  Plan:  1. Right hip pain - consistent with greater trochanteric pain syndrome.  Home exercises reviewed and she will start physical therapy.  Injection given today.  Icing, tylenol or aleve if needed.  F/u in 6 weeks.  After informed written consent timeout was performed, patient was lying on left side on exam table.  Area overlying right trochanteric bursa prepped with alcohol swab then injected with 4:1 lidocaine: depomedrol 80mg .  Patient tolerated procedure well without immediate complications.

## 2023-05-31 NOTE — Therapy (Addendum)
 OUTPATIENT PHYSICAL THERAPY EVALUATION  discharge   Patient Name: Katherine Haynes MRN: [id number] DOB:Jul 15, 1951, 72 y.o., female Today's Date: 06/01/2023   END OF SESSION:  PT End of Session - 06/01/23 1032     Visit Number 1    Number of Visits 9    Date for PT Re-Evaluation 07/27/23    Authorization Type [employer] MCR    PT Start Time 1015    PT Stop Time 1100    PT Time Calculation (min) 45 min    Activity Tolerance Patient tolerated treatment well    Behavior During Therapy WFL for tasks assessed/performed             Past Medical History:  Diagnosis Date   Hyperlipidemia    Hypertension    Menopause syndrome    Past Surgical History:  Procedure Laterality Date   CHOLECYSTECTOMY N/A 04/05/2017   Procedure: LAPAROSCOPIC CHOLECYSTECTOMY;  Surgeon: Kinsinger, De Blanch, MD;  Location: [facility];  Service: General;  Laterality: N/A;   MOUTH SURGERY     TONSILLECTOMY     TRANSTHORACIC ECHOCARDIOGRAM  04/19/2019    EF 66 5%.  Mild LVH.  GR 1 DD.  Normal atrial sizes.  Mild aortic valve sclerosis but no stenosis.  Normal IVC.  Otherwise normal valves.   TUBAL LIGATION     Patient Active Problem List   Diagnosis Date Noted   Claudication (HCC) 03/01/2023   Trochanteric bursitis of right hip and ITB syndrome 01/25/2023   SI (sacroiliac) joint dysfunction 06/01/2022   Screening mammogram for breast cancer 12/08/2021   BPPV (benign paroxysmal positional vertigo) 12/08/2021   Major depression 08/04/2021   Need for shingles vaccine 06/23/2021   Cervical myelopathy with cervical radiculopathy (HCC) 11/18/2020   LBBB (left bundle branch block) 10/26/2020   Vasomotor symptoms due to menopause 01/07/2019   Lymphedema 12/11/2018   Insomnia 09/11/2017   Status post cholecystectomy 05/01/2017   Healthcare maintenance 04/25/2017   Seasonal allergies 10/07/2010   Hyperlipidemia 05/14/2007   Essential hypertension 05/25/2006    PCP: Reymundo Poll, MD  REFERRING  PROVIDER: Miguel Aschoff, MD  REFERRING DIAG: Rib pain on right side  THERAPY DIAG:  Pain in right hip  Muscle weakness (generalized)  Rationale for Evaluation and Treatment: Rehabilitation  ONSET DATE: 4-5 months ago   SUBJECTIVE:  SUBJECTIVE STATEMENT: Patient reports right hip pain that is right in the joint, that will sometimes shoot down her right leg. She is walking a little better now and she is unable to sleep on her right side, which is the side she likes to sleep on. She can walk a little distance but then the right hip will start bothering her. Sometimes if she puts all her weight on the right side then it will start bothering her. She has trouble with stairs if she has to do more than 6-8. This has been going on for the past 4-5 months and it has been getting worse. She did have a steroid injection that helped some and she is not in as much pain as she was.   PERTINENT HISTORY: See PMH above   PAIN:  Are you having pain? Yes:  NPRS scale: 5/10, worst 7/10 Pain location: Right hip pain Pain description: Achy, sometime sharp Aggravating factors: Walking, standing, lying on right side Relieving factors: Medication  PRECAUTIONS: None  RED FLAGS: None   WEIGHT BEARING RESTRICTIONS: No  FALLS:  Has patient fallen in last 6 months? No  LIVING ENVIRONMENT: Lives in: House/apartment  Stairs: Yes: External: 1 steps; none  PLOF: Independent  PATIENT GOALS: Pain relief with walking and sleeping   OBJECTIVE:  Note: Objective measures were completed at Evaluation unless otherwise noted. PATIENT SURVEYS:  FOTO 59% functional status  COGNITION: Overall cognitive status: Within functional limits for tasks assessed     SENSATION: WFL  MUSCLE LENGTH: Slight limitations of bilateral hamstring flexibility  POSTURE:   WFL  PALPATION: Tender to palpation over right greater trochanter and glute med, proximal ITB and vastus lateralis  LOWER EXTREMITY  ROM:   Hip PROM grossly WFL, increased pain with all planes of right hip motion  LOWER EXTREMITY MMT:  MMT Right eval Left eval  Hip flexion 4- 4  Hip extension 3- 4-  Hip abduction 3- 4-  Hip adduction    Hip internal rotation    Hip external rotation    Knee flexion 5 5  Knee extension 4+ 5  Ankle dorsiflexion    Ankle plantarflexion    Ankle inversion    Ankle eversion     (Blank rows = not tested)  LOWER EXTREMITY SPECIAL TESTS:  FABER: positive on right  FUNCTIONAL TESTS:  30 seconds chair stand test: 8 reps  Knee valgus R > L while performing sit to stands SLS: < 5 seconds on right with trendelenburg noted  GAIT: Assistive device utilized: None Level of assistance: Complete Independence Comments: Trendelenburg on right   TODAY'S TREATMENT:       [facility] Adult PT Treatment:                                                DATE: 06/01/2023 Therapeutic Exercise: Side clamshell with yellow 3 x 10  PATIENT EDUCATION:  Education details: Exam findings, POC, HEP Person educated: Patient Education method: Explanation, Demonstration, Tactile cues, Verbal cues, and Handouts Education comprehension: verbalized understanding, returned demonstration, verbal cues required, tactile cues required, and needs further education  HOME EXERCISE PROGRAM: Access Code: [email or web address]    ASSESSMENT: CLINICAL IMPRESSION: Patient is a 72 y.o. female who was seen today for physical therapy evaluation and treatment for chronic right hip pain. Her symptoms seem consistent with right greater trochanteric pain syndrome likely due to glute med tendinopathy. She demonstrates weakness of the right hip with tenderness primarily of the right greater trochanter and glute med region. She demonstrates trendelenburg on the right with knee valgus while performing sit to stand movements.    OBJECTIVE IMPAIRMENTS: Abnormal gait, decreased activity tolerance, decreased balance, decreased strength, impaired  flexibility, improper body mechanics, and pain.   ACTIVITY LIMITATIONS: standing, squatting, sleeping, stairs, and locomotion level  PARTICIPATION LIMITATIONS: meal prep, cleaning, shopping, and community activity  PERSONAL FACTORS: Fitness and Time since onset of injury/illness/exacerbation are also affecting patient's functional outcome.   REHAB POTENTIAL: Good  CLINICAL DECISION MAKING: Stable/uncomplicated  EVALUATION COMPLEXITY: Low   GOALS: Goals reviewed with patient? Yes  SHORT TERM GOALS: Target date: 06/29/2023  Patient will be I with initial HEP in order to progress with therapy. Baseline: HEP provided at eval Goal status: INITIAL  2.  Patient will report right hip pain with activity </= 5/10 in order to reduce functional limitations Baseline: 7/10 pain Goal status: INITIAL  LONG TERM GOALS: Target date: 07/27/2023  Patient will be I with final HEP to maintain progress from PT. Baseline: HEP provided at eval Goal status: INITIAL  2.  Patient will report >/= 63% status on FOTO to indicate improved functional ability. Baseline: 59% functional status Goal status: INITIAL  3.  Patient will demonstrate right hip strength >/= 4-/5 MMT in order to improve her standing tolerance and reduce trendelenburg gait Baseline: 3-/5 MMT Goal status: INITIAL  4.  Patient will perform 30 sec stand test >/= 12 reps in order to indicate improved LE strength to improve her activity tolerance Baseline: 8 reps Goal status: INITIAL  5. Patient will demonstrate SLS >/= 20 seconds with ability to maintain neutral pelvis to indicate improved gluteal control and balance  Baseline: < 5 sec with trendelenburg  Goal status: INITIAL   PLAN: PT FREQUENCY: 1x/week  PT DURATION: 8 weeks  PLANNED INTERVENTIONS: 97164- PT Re-evaluation, 97110-Therapeutic exercises, 97530- Therapeutic activity, 97112- Neuromuscular re-education, 97535- Self Care, 40981- Manual therapy, 97116- Gait training,  97014- Electrical stimulation (unattended), [phone]- Electrical stimulation (manual), 97033- Ionotophoresis 4mg /ml Dexamethasone, Patient/Family education, Balance training, Dry Needling, Joint mobilization, Joint manipulation, Spinal manipulation, Spinal mobilization, Cryotherapy, and Moist heat  PLAN FOR NEXT SESSION: Review HEP and progress PRN, manual/TPDN for right gluteal and vastus lateralis region, progress right hip, core, and LE strengthening, progress to closed chain exercises and SLS   Rosana Hoes, PT, DPT, LAT, ATC 06/01/23  1:34 PM Phone: [phone] Fax: [phone]   Date of referral: 04/26/2023 Referring provider: Miguel Aschoff, MD Referring diagnosis? R07.81 (ICD-10-CM) - Rib pain on right side  Treatment diagnosis? (if different than referring diagnosis) ICD-10-CM: M25.551 - Pain in right hip  What was this (referring dx) caused by? Ongoing Issue  Ashby Dawes of Condition: Chronic (continuous duration > 3 months)   Laterality: Rt  Current Functional Measure Score: FOTO 59%  Objective measurements identify impairments when they are compared to normal values, the uninvolved extremity, and prior level of function.  [x]  Yes  []  No  Objective assessment of functional ability: Moderate functional limitations   Briefly describe symptoms: Patient reports chronic right hip pain with standing, walking, general activity, and while lying on the left side that is impacting her daily activities and community access, and impairing her sleeping ability.   How did symptoms start: Gradual onset without specific mechanism, have been worsening over past 4-5 months  Average pain intensity:  Last 24 hours: 5/10  Past week: 5-7/10  How often does the pt experience symptoms? Constantly  How much have the symptoms interfered with usual daily activities? Moderately  How has condition changed since care began at this facility? NA - initial visit  In general, how is the  patients overall health? Good  BACK PAIN (STarT Back Screening Tool) No    PHYSICAL THERAPY DISCHARGE SUMMARY  Visits from Start of Care: 1  Current functional level related to goals / functional outcomes: See above   Remaining deficits: See above   Education / Equipment: HEP   Patient agrees to discharge. Patient goals were not met. Patient is being discharged due to not returning since the last visit.  Rosana Hoes, PT, DPT, LAT, ATC 09/27/23  3:51 PM Phone: [phone] Fax: [phone]

## 2023-06-01 ENCOUNTER — Other Ambulatory Visit: Payer: Self-pay

## 2023-06-01 ENCOUNTER — Ambulatory Visit: Payer: Medicare Other | Attending: Internal Medicine | Admitting: Physical Therapy

## 2023-06-01 ENCOUNTER — Encounter: Payer: Self-pay | Admitting: Physical Therapy

## 2023-06-01 DIAGNOSIS — R0781 Pleurodynia: Secondary | ICD-10-CM | POA: Insufficient documentation

## 2023-06-01 DIAGNOSIS — M25551 Pain in right hip: Secondary | ICD-10-CM | POA: Diagnosis not present

## 2023-06-01 DIAGNOSIS — M6281 Muscle weakness (generalized): Secondary | ICD-10-CM | POA: Insufficient documentation

## 2023-06-01 NOTE — Patient Instructions (Signed)
Access Code: [email or web address] URL: [email or web address] Date: 06/01/2023 Prepared by: Rosana Hoes  Exercises - Clam with Resistance  - 1-2 x daily - 3 sets - 10 reps

## 2023-06-21 ENCOUNTER — Ambulatory Visit: Payer: Medicare Other | Admitting: Physical Therapy

## 2023-06-27 ENCOUNTER — Ambulatory Visit: Payer: Medicare Other | Admitting: Physical Therapy

## 2023-06-29 ENCOUNTER — Ambulatory Visit: Payer: Medicare Other | Admitting: Family Medicine

## 2023-09-13 ENCOUNTER — Encounter: Payer: Medicare Other | Admitting: Internal Medicine

## 2023-10-04 ENCOUNTER — Ambulatory Visit (INDEPENDENT_AMBULATORY_CARE_PROVIDER_SITE_OTHER): Payer: Medicare Other | Admitting: Internal Medicine

## 2023-10-04 VITALS — BP 133/63 | HR 84 | Temp 98.3°F | Ht 65.0 in | Wt 156.4 lb

## 2023-10-04 DIAGNOSIS — I10 Essential (primary) hypertension: Secondary | ICD-10-CM | POA: Diagnosis not present

## 2023-10-04 DIAGNOSIS — G4700 Insomnia, unspecified: Secondary | ICD-10-CM

## 2023-10-04 DIAGNOSIS — F4321 Adjustment disorder with depressed mood: Secondary | ICD-10-CM

## 2023-10-04 DIAGNOSIS — F5101 Primary insomnia: Secondary | ICD-10-CM

## 2023-10-04 DIAGNOSIS — F321 Major depressive disorder, single episode, moderate: Secondary | ICD-10-CM | POA: Diagnosis not present

## 2023-10-04 MED ORDER — TRAZODONE HCL 100 MG PO TABS: 50.0000 mg | ORAL_TABLET | Freq: Every evening | ORAL | 1 refills | Status: DC | PRN

## 2023-10-04 NOTE — Progress Notes (Unsigned)
 Subjective:   Patient ID: Katherine Haynes female   DOB: 06/03/1951 72 y.o.   MRN: [id number]  HPI: Katherine Haynes is a 73 y.o. female with past medical history outlined below here for acute grief and insomnia. For further details of today's visit, please refer to the assessment and plan below.   Past Medical History:  Diagnosis Date   Hyperlipidemia    Hypertension    Menopause syndrome    Current Outpatient Medications  Medication Sig Dispense Refill   traZODone (DESYREL) 100 MG tablet Take 0.5-1 tablets (50-100 mg total) by mouth at bedtime as needed for sleep. 90 tablet 1   amLODipine (NORVASC) 10 MG tablet TAKE 1 TABLET(10 MG) BY MOUTH DAILY 90 tablet 3   atorvastatin (LIPITOR) 40 MG tablet TAKE 1 TABLET(40 MG) BY MOUTH DAILY 90 tablet 3   diclofenac Sodium (VOLTAREN ARTHRITIS PAIN) 1 % GEL Apply 2 g topically 2 (two) times daily as needed. 100 g 0   diclofenac Sodium (VOLTAREN) 1 % GEL APPLY 4G TOPICALLY 4 TIMES DAILY 100 g 2   escitalopram (LEXAPRO) 20 MG tablet TAKE 1 TABLET(20 MG) BY MOUTH DAILY 90 tablet 3   furosemide (LASIX) 20 MG tablet Take 3 tablets (60 mg total) by mouth daily. 270 tablet 1   methocarbamol (ROBAXIN) 750 MG tablet Take 2 tablets (1,500 mg total) by mouth every 8 (eight) hours as needed for muscle spasms. 180 tablet 1   naproxen (NAPROSYN) 500 MG tablet Take 1 tablet (500 mg total) by mouth 2 (two) times daily with a meal. 60 tablet 2   potassium chloride (KLOR-CON) 10 MEQ tablet Take 1 tablet (10 mEq total) by mouth daily. 90 tablet 3   No current facility-administered medications for this visit.   Family History  Problem Relation Age of Onset   Heart disease Mother        "enlarged heart"   Hypertension Mother    Diabetes Mother    Heart disease Maternal Uncle        Bypass surg   Heart attack Maternal Grandmother        also had irregular heart beat requriring a pacemaker.   Hypertension Other        In multiple relatives.     Hypertension Paternal Grandmother    COPD Sister    Sarcoidosis Sister    Heart block Sister        s/p PPM   Hypertension Brother    Hypertension Sister    Arthritis Sister    COPD Sister    Diabetes Sister    Hypertension Sister    Arthritis Sister    Hypertension Brother    Healthy Brother    Healthy Brother    Healthy Sister    Breast cancer Neg Hx    Social History   Socioeconomic History   Marital status: Married    Spouse name: Not on file   Number of children: Not on file   Years of education: Not on file   Highest education level: Not on file  Occupational History   Not on file  Tobacco Use   Smoking status: Former   Smokeless tobacco: Never   Tobacco comments:     1 PPD for 2-3 years. Quit ~9147.  Vaping Use   Vaping status: Never Used  Substance and Sexual Activity   Alcohol use: No   Drug use: No   Sexual activity: Yes    Birth control/protection: Surgical  Other Topics Concern  Not on file  Social History Narrative   Lives with husband. Good relationship, feels safe at home. Studying medical administration.       Current Social History 09/27/2018        Patient lives with spouse in an apartment on the second floor. There are 14 steps with handrails up to the entrance the patient uses.       Patient's method of transportation is personal car.      The highest level of education was high school diploma.      The patient currently is employed as a Engineer, site in patient's homes.      Identified important Relationships are "My husband, my kids, my grands and great grands."       Pets : None       Interests / Fun: Watching TV, going to beach and park, cooking out with family.       Current Stressors: "finances, teenagers" (grandkids)       Religious / Personal Beliefs: Methodist, "I was raised to treat people the way you want to be treated.          Social Drivers of Corporate investment banker Strain: Low Risk  (03/01/2023)   Overall  Financial Resource Strain (CARDIA)    Difficulty of Paying Living Expenses: Not hard at all  Food Insecurity: No Food Insecurity (03/01/2023)   Hunger Vital Sign    Worried About Running Out of Food in the Last Year: Never true    Ran Out of Food in the Last Year: Never true  Transportation Needs: No Transportation Needs (03/01/2023)   PRAPARE - Administrator, Civil Service (Medical): No    Lack of Transportation (Non-Medical): No  Physical Activity: Insufficiently Active (03/01/2023)   Exercise Vital Sign    Days of Exercise per Week: 4 days    Minutes of Exercise per Session: 30 min  Stress: No Stress Concern Present (03/01/2023)   [employer] of Occupational Health - Occupational Stress Questionnaire    Feeling of Stress : Only a little  Social Connections: Moderately Integrated (03/01/2023)   Social Connection and Isolation Panel [NHANES]    Frequency of Communication with Friends and Family: More than three times a week    Frequency of Social Gatherings with Friends and Family: More than three times a week    Attends Religious Services: More than 4 times per year    Active Member of Clubs or Organizations: No    Attends Banker Meetings: Never    Marital Status: Married     Objective:  Physical Exam:  Vitals:   10/04/23 1018 10/04/23 1045  BP: (!) 141/55 133/63  Pulse: 88 84  Temp: 98.3 F (36.8 C)   TempSrc: Oral   SpO2: 97%   Weight: 156 lb 6.4 oz (70.9 kg)   Height: 5\' 5"  (1.651 m)     Constitutional: Tearful; not acutely ill  Cardiovascular: RRR, no m/r/g Pulmonary/Chest: Clear, normal effort Extremities: No swelling  Psychiatric: Depressed mood and affect   Assessment & Plan:   Grief reaction Patient is here for an acute grief reaction and exacerbation of her chronic insomnia. Her 12 yo son unexpectedly died this past month. He was sick with the flu and became unresponsive. She performed CPR until paramedics arrived. He was  resuscitated but had massive brain injury and passed away in the hospital the following day. Her son left behind multiple young children who she is now caring  for. One who is about to graduate from high school. She is appropriately tearful during our conversation. She has MDD that was previously well controlled on lexapro. For her acute grief and insomnia, we have opted to started trazodone PRN. We trialed this before with some effect but will try a higher dose (50-100 mg). She is also interested in grief counseling, I have placed a referral to our behavioral health counselor.   Major depression Continue lexapro 20 mg daily.   Insomnia Restart trazodone PRN; increased dose 50-100 mg at bedtime PRN. We discussed short course of benzos or hypnotics like ambien but she prefers a lower risk medication.   Essential hypertension Chronic, near goal on amlodipine. Also takes lasix for venous insufficiency swelling. Checking BMP.

## 2023-10-04 NOTE — Patient Instructions (Signed)
 Ms. Bauza,  It was a pleasure to see you. I am terribly sorry to hear about your son; my thoughts are with you and your family.   Please follow up again in 3-6 months with your new PCP.   If you have any questions or concerns, call our clinic at [phone] or after hours call [phone] and ask for the internal medicine resident on call.   Thank you!  Dr. Reece Agar

## 2023-10-05 ENCOUNTER — Encounter: Payer: Self-pay | Admitting: Internal Medicine

## 2023-10-05 ENCOUNTER — Other Ambulatory Visit: Payer: Self-pay | Admitting: Internal Medicine

## 2023-10-05 DIAGNOSIS — E876 Hypokalemia: Secondary | ICD-10-CM

## 2023-10-05 LAB — BMP8+ANION GAP
Anion Gap: 15 mmol/L (ref 10.0–18.0)
BUN/Creatinine Ratio: 12 (ref 12–28)
BUN: 10 mg/dL (ref 8–27)
CO2: 26 mmol/L (ref 20–29)
Calcium: 9.3 mg/dL (ref 8.7–10.3)
Chloride: 98 mmol/L (ref 96–106)
Creatinine, Ser: 0.83 mg/dL (ref 0.57–1.00)
Glucose: 139 mg/dL — ABNORMAL HIGH (ref 70–99)
Potassium: 3.2 mmol/L — ABNORMAL LOW (ref 3.5–5.2)
Sodium: 139 mmol/L (ref 134–144)
eGFR: 76 mL/min/{1.73_m2} (ref >59)

## 2023-10-05 MED ORDER — POTASSIUM CHLORIDE CRYS ER 20 MEQ PO TBCR: 20.0000 meq | EXTENDED_RELEASE_TABLET | Freq: Two times a day (BID) | ORAL | 0 refills | Status: DC

## 2023-10-05 NOTE — Assessment & Plan Note (Signed)
 Restart trazodone PRN; increased dose 50-100 mg at bedtime PRN. We discussed short course of benzos or hypnotics like ambien but she prefers a lower risk medication.

## 2023-10-05 NOTE — Addendum Note (Signed)
 Addended by: Burnell Blanks on: 10/05/2023 09:43 AM   Modules accepted: Orders

## 2023-10-05 NOTE — Assessment & Plan Note (Signed)
 Patient is here for an acute grief reaction and exacerbation of her chronic insomnia. Her 71 yo son unexpectedly died this past month. He was sick with the flu and became unresponsive. She performed CPR until paramedics arrived. He was resuscitated but had massive brain injury and passed away in the hospital the following day. Her son left behind multiple young children who she is now caring for. One who is about to graduate from high school. She is appropriately tearful during our conversation. She has MDD that was previously well controlled on lexapro. For her acute grief and insomnia, we have opted to started trazodone PRN. We trialed this before with some effect but will try a higher dose (50-100 mg). She is also interested in grief counseling, I have placed a referral to our behavioral health counselor.

## 2023-10-05 NOTE — Assessment & Plan Note (Signed)
Continue lexapro 20 mg daily   

## 2023-10-05 NOTE — Assessment & Plan Note (Signed)
 Chronic, near goal on amlodipine. Also takes lasix for venous insufficiency swelling. Checking BMP.

## 2023-10-06 ENCOUNTER — Telehealth: Payer: Self-pay | Admitting: *Deleted

## 2023-10-06 NOTE — Progress Notes (Signed)
 Complex Care Management Note  Care Guide Note 10/06/2023 Name: Katherine Haynes MRN: [id number] DOB: 1951/06/11  Katherine Haynes is a 73 y.o. year old female who sees Reymundo Poll, MD for primary care. I reached out to Myrtha Mantis by phone today to offer complex care management services.  [name] was given information about Complex Care Management services today including:   The Complex Care Management services include support from the care team which includes your Nurse Care Manager, Clinical Social Worker, or Pharmacist.  The Complex Care Management team is here to help remove barriers to the health concerns and goals most important to you. Complex Care Management services are voluntary, and the patient may decline or stop services at any time by request to their care team member.   Complex Care Management Consent Status: Patient agreed to services and verbal consent obtained.   Follow up plan:  Telephone appointment with complex care management team member scheduled for:  3/25  Encounter Outcome:  Patient Scheduled  Gwenevere Ghazi  [facility]  [facility], [facility] Guide  Direct Dial: [phone]  Fax [phone]

## 2023-10-10 ENCOUNTER — Ambulatory Visit: Payer: Self-pay | Admitting: Licensed Clinical Social Worker

## 2023-10-10 NOTE — Patient Outreach (Signed)
 Care Coordination   Initial Visit Note   10/10/2023 Name: Katherine Haynes MRN: [id number] DOB: 1951-05-16  Katherine Haynes is a 73 y.o. year old female who sees Katherine Poll, MD for primary care. I spoke with  Katherine Haynes by phone today.  What matters to the patients health and wellness today? Patient is experiencing stress and grief over the recent death of her son    Goals Addressed             This Visit's Progress    Patient is experiencing stress and grief over the recent death of her son       Interventions:   Spoke with client via phone today about client needs and status Client recently experienced the death of her son (11 years old) who died unexpectedly.  Client is dealing with grief issues related to sudden death of her son.  LCSW spoke with client about local [facility] agency and grief support and bereavement support through [facility].   Client and LCSW spoke of pain issues of client. She said she has some occasional hip pai issues Discussed sleeping issues. She is sleeping better now she said. Discussed appetite of client. She said she has reduced appetite. Reviewed transport needs. She drives her car as needed to appointments and to complete errands Discussed client support from PCP.   Discussed walking of client. She said she  is walking well and does not use device to help her walk Discussed medication procurement for client Discussed family support. She has 2 sisters . She speaks frequently via phone with her 2 sister. One sister lives in [place], [place]. One sister lives in [place], [place] Client resides in a house with her spouse. Her spouse works during the day Discussed program support with RN, LCSW, Pharmacist. Encouraged client to access program support services Provided counseling support Discussed client work history as CNA.  Thanked client for phone call with LCSW today Encouraged Katherine Haynes to call LCSW as needed for SW support at  [phone].          SDOH assessments and interventions completed:  Yes  SDOH Interventions Today    Flowsheet Row Most Recent Value  SDOH Interventions   Depression Interventions/Treatment  Counseling  Physical Activity Interventions Other (Comments)  [likes to walk outdoors with her pet dog]  Stress Interventions Provide Counseling  [Experiencing stress and grief over the recent death of her son (unexpected death of her son)]        Care Coordination Interventions:  Yes, provided   Interventions Today    Flowsheet Row Most Recent Value  Chronic Disease   Chronic disease during today's visit Other  [spoke with client about client needs]  General Interventions   General Interventions Discussed/Reviewed General Interventions Discussed, Community Resources  Education Interventions   Education Provided Provided Education  Provided Verbal Education On [facility]  Mental Health Interventions   Mental Health Discussed/Reviewed Coping Strategies  [trying to use coping skills to manage grief issues. Has strong family support. this is helpful]  Nutrition Interventions   Nutrition Discussed/Reviewed Nutrition Discussed  Pharmacy Interventions   Pharmacy Dicussed/Reviewed Pharmacy Topics Discussed  Safety Interventions   Safety Discussed/Reviewed Fall Risk        Follow up plan: Follow up call scheduled for 11/13/23 at 2:30 PM     Encounter Outcome:  Patient Visit Completed    Lorna Few  MSW, LCSW [place]/[facility] Based [facility] Licensed Clinical Social Worker Direct Dial:  [phone]  Fax:  [phone] Website:  [email or web address]

## 2023-10-10 NOTE — Patient Instructions (Signed)
 Visit Information  Thank you for taking time to visit with me today. Please don't hesitate to contact me if I can be of assistance to you.   Following are the goals we discussed today:   Goals Addressed             This Visit's Progress    Patient is experiencing stress and grief over the recent death of her son       Interventions:   Spoke with client via phone today about client needs and status Client recently experienced the death of her son (71 years old) who died unexpectedly.  Client is dealing with grief issues related to sudden death of her son.  LCSW spoke with client about local [facility] agency and grief support and bereavement support through [facility].   Client and LCSW spoke of pain issues of client. She said she has some occasional hip pai issues Discussed sleeping issues. She is sleeping better now she said. Discussed appetite of client. She said she has reduced appetite. Reviewed transport needs. She drives her car as needed to appointments and to complete errands Discussed client support from PCP.   Discussed walking of client. She said she  is walking well and does not use device to help her walk Discussed medication procurement for client Discussed family support. She has 2 sisters . She speaks frequently via phone with her 2 sister. One sister lives in [place], [place]. One sister lives in [place], [place] Client resides in a house with her spouse. Her spouse works during the day Discussed program support with RN, LCSW, Pharmacist. Encouraged client to access program support services Provided counseling support Discussed client work history as CNA.  Thanked client for phone call with LCSW today Encouraged Meriem to call LCSW as needed for SW support at [phone].          Our next appointment is by telephone on 11/13/23 at 2:30 PM   Please call the care guide team at [phone] if you need to cancel or reschedule your appointment.   If you are  experiencing a Mental Health or Behavioral Health Crisis or need someone to talk to, please go to [facility] [address], [place] [phone])   The patient verbalized understanding of instructions, educational materials, and care plan provided today and DECLINED offer to receive copy of patient instructions, educational materials, and care plan.   The patient has been provided with contact information for the care management team and has been advised to call with any health related questions or concerns.    Lorna Few  MSW, LCSW [place]/[facility] Based Care [facility] Licensed Clinical Social Worker Direct Dial:  [phone] Fax:  [phone] Website:  [email or web address]

## 2023-10-13 ENCOUNTER — Other Ambulatory Visit: Payer: Self-pay | Admitting: Internal Medicine

## 2023-10-13 DIAGNOSIS — E876 Hypokalemia: Secondary | ICD-10-CM

## 2023-10-16 MED ORDER — POTASSIUM CHLORIDE ER 10 MEQ PO TBCR: 10.0000 meq | EXTENDED_RELEASE_TABLET | Freq: Every day | ORAL | 3 refills | Status: AC

## 2023-10-16 NOTE — Telephone Encounter (Signed)
 Reviewed and called patient, she actually wanted her standard dose of potassium refilled, not the one time increased dose.

## 2023-10-20 ENCOUNTER — Encounter: Payer: Self-pay | Admitting: Internal Medicine

## 2023-10-23 NOTE — Telephone Encounter (Signed)
 Discussed with patient she took the extra potassium prescribed and will continue her daily supplement.  She will make a follow up appointment to discuss a recently diagnosed cancer in first degree relative with potential heritability but she is unsure which one but told she needed genetic testing.  She will find out relative details and discuss at FU appointment and we can recheck potassium as well.

## 2023-11-13 ENCOUNTER — Ambulatory Visit: Payer: Self-pay | Admitting: Licensed Clinical Social Worker

## 2023-11-13 NOTE — Patient Outreach (Signed)
 Complex Care Management   Visit Note  11/13/2023  Name:  Katherine Haynes MRN: [id number] DOB: [birth year]  Situation: Referral received for Complex Care Management related to  grief management  I obtained verbal consent from Patient.  Visit completed with patient  on the phone  Background:   Past Medical History:  Diagnosis Date   Hyperlipidemia    Hypertension    Menopause syndrome     Assessment: Patient Reported Symptoms:  Cognitive    Some concentration issues occasionally    Neurological    Major Depression ; Vertigo; Grief Reaction  HEENT    No issues noted    Cardiovascular    HTN  Respiratory    No issues noted  Endocrine  No problems mentioned by client    Gastrointestinal      No problems mentioned by client  Genitourinary  none    Integumentary  Unable to assess    Musculoskeletal    May fatigue occasionally in walking; Vertigo. May be at risk for falls      Psychosocial    Grief reaction related to death of her son (unexpectedly) Insomnia; Major Depression; may take more time to complete ADLs   Quality of Family Relationships: supportive Do you feel physically threatened by others?: No      11/13/2023    1:35 PM  Depression screen PHQ 2/9  Decreased Interest 1  Down, Depressed, Hopeless 1  PHQ - 2 Score 2  Altered sleeping 1  Tired, decreased energy 1  Change in appetite 0  Feeling bad or failure about yourself  1  Trouble concentrating 1  Moving slowly or fidgety/restless 1  Suicidal thoughts 0  PHQ-9 Score 7  Difficult doing work/chores Somewhat difficult    Vitals:  Within normal range per client  Medications Reviewed Today     Reviewed by Afton Horse (Social Worker) on 11/13/23 at 1333  Med List Status: <None>   Medication Order Taking? Sig Documenting Provider Last Dose Status Informant  amLODipine  (NORVASC ) 10 MG tablet [id number]  TAKE 1 TABLET(10 MG) BY MOUTH DAILY Sherol Dixie, MD  Active    atorvastatin  (LIPITOR) 40 MG tablet [id number]  TAKE 1 TABLET(40 MG) BY MOUTH DAILY Guilloud, Carolyn, MD  Active   diclofenac  Sodium (VOLTAREN  ARTHRITIS PAIN) 1 % GEL [id number] No Apply 2 g topically 2 (two) times daily as needed. Nguyen, Quan, DO Taking Active   diclofenac  Sodium (VOLTAREN ) 1 % GEL [id number]  APPLY 4G TOPICALLY 4 TIMES DAILY Guilloud, Carolyn, MD  Active   escitalopram  (LEXAPRO ) 20 MG tablet [id number]  TAKE 1 TABLET(20 MG) BY MOUTH DAILY Guilloud, Carolyn, MD  Active   furosemide  (LASIX ) 20 MG tablet [id number]  Take 3 tablets (60 mg total) by mouth daily. Guilloud, Carolyn, MD  Active   methocarbamol  (ROBAXIN ) 750 MG tablet [id number] No Take 2 tablets (1,500 mg total) by mouth every 8 (eight) hours as needed for muscle spasms. Guilloud, Carolyn, MD Taking Active   naproxen  (NAPROSYN ) 500 MG tablet [id number] No Take 1 tablet (500 mg total) by mouth 2 (two) times daily with a meal. Lasandra Points, MD Taking Active   potassium chloride  (KLOR-CON ) 10 MEQ tablet [id number]  Take 1 tablet (10 mEq total) by mouth daily. Priscella Brooms, DO  Active   traZODone  (DESYREL ) 100 MG tablet [id number]  Take 0.5-1 tablets (50-100 mg total) by mouth at bedtime as needed for sleep. Guilloud, Carolyn, MD  Active  Recommendation:   Client to attend medical appointments as scheduled Client to take medications as prescribed Client to allow time for self care Client to allow extra time to complete ADLs Client to contact LCSW as needed for SW support related to grief issues faced  Follow Up Plan:   Telephone follow up appointment date/time:  12/26/23 at 11:00 AM with LCSW   Alexandria Angel  MSW, LCSW [place]/Value Based Care [facility] Licensed Clinical Social Worker Direct Dial:  [phone] Fax:  [phone] Website:  [email or web address]

## 2023-11-13 NOTE — Patient Instructions (Signed)
 Visit Information  Thank you for taking time to visit with me today. Please don't hesitate to contact me if I can be of assistance to you before our next scheduled appointment.  Our next appointment is by telephone on 12/26/23 at 11:00 AM   Please call the care guide team at [phone] if you need to cancel or reschedule your appointment.   Following is a copy of your care plan:   Goals Addressed             This Visit's Progress    VBCI Social Work Care Plan       Problems:   Grief issues faced             Depression issues              Insomnia              Vertigo             HTN              CSW Clinical Goal(s):   Over the next 30  days the Patient will attend all scheduled medical appointments as evidenced by patient report and care team review of appointment completion in electronic MEDICAL RECORD NUMBER  .           Over next 30 days, the Patient will use coping skills to help her mange grief issues experienced AEB client report of reduction in grief symptoms faced  Interventions:  Discussed grief issues of client related to death of her son             Discussed pain issues of client             Discussed medication procurement of client             Discussed client transport needs. She drives her car as needed             Discussed family support of client. Client said her family is very supportive             Discussed program support with RN, LCSW, Pharmacist             Discussed support of LCSW. Encouraged Samaria to call LCSW as needed for SW support at [phone]  Patient Goals/Self-Care Activities:  Take medications as prescribed              Attend medical appointments as scheduled              Allow time for self care (adequate rest, eating meals on schedule, allowing time for socializing with friends)              Call LCSW as needed to discuss SW needs of client              Talk with PCP about her medical needs  Plan:   Telephone follow up  appointment with care management team member scheduled for:  12/26/23 at 11:00 AM        Please go to [facility] [address] [place] [phone]) if you are experiencing a Mental Health or Behavioral Health Crisis or need someone to talk to.  The patient verbalized understanding of instructions, educational materials, and care plan provided today and DECLINED offer to receive copy of patient instructions, educational materials, and care plan.   Patient received contact information for reaching the Care team as needed. LCSW gave client LCSW name  and phone number for client to use as needed for SW support   Alexandria Angel  MSW, LCSW [place]/[facility] Based Care [facility] Licensed Clinical Social Worker Direct Dial:  [phone] Fax:  [phone] Website:  [email or web address]

## 2023-12-04 ENCOUNTER — Telehealth: Payer: Self-pay | Admitting: *Deleted

## 2023-12-04 MED ORDER — GABAPENTIN 600 MG PO TABS: 600.0000 mg | ORAL_TABLET | Freq: Every evening | ORAL | 3 refills | Status: DC | PRN

## 2023-12-04 NOTE — Telephone Encounter (Signed)
 Copied from CRM [id number]. Topic: General - Other >> Dec 04, 2023  3:04 PM Adrianna P wrote: Reason for CRM: [facility] called they have sent over multiple requests for gabapentin  prescription

## 2023-12-04 NOTE — Telephone Encounter (Signed)
 Message sent to Attending Pool for refill. Copied from CRM [id number]. Topic: General - Other >> Dec 04, 2023  3:04 PM Adrianna P wrote: Reason for CRM: [facility] called they have sent over multiple requests for gabapentin  prescription

## 2023-12-20 ENCOUNTER — Ambulatory Visit: Payer: Medicare Other

## 2023-12-20 VITALS — Ht 66.0 in | Wt 165.0 lb

## 2023-12-20 DIAGNOSIS — Z0000 Encounter for general adult medical examination without abnormal findings: Secondary | ICD-10-CM

## 2023-12-20 NOTE — Progress Notes (Signed)
 Because this visit was a virtual/telehealth visit,  certain criteria was not obtained, such a blood pressure, CBG if applicable, and timed get up and go. Any medications not marked as "taking" were not mentioned during the medication reconciliation part of the visit. Any vitals not documented were not able to be obtained due to this being a telehealth visit or patient was unable to self-report a recent blood pressure reading due to a lack of equipment at home via telehealth. Vitals that have been documented are verbally provided by the patient.   Subjective:   Katherine Haynes is a 73 y.o. who presents for a Medicare Wellness preventive visit.  As a reminder, Annual Wellness Visits don't include a physical exam, and some assessments may be limited, especially if this visit is performed virtually. We may recommend an in-person follow-up visit with your provider if needed.  Visit Complete: Virtual I connected with  Katherine Haynes on 12/20/23 by a audio enabled telemedicine application and verified that I am speaking with the correct person using two identifiers.  Patient Location: Home  Provider Location: Office/Clinic  I discussed the limitations of evaluation and management by telemedicine. The patient expressed understanding and agreed to proceed.  Vital Signs: Because this visit was a virtual/telehealth visit, some criteria may be missing or patient reported. Any vitals not documented were not able to be obtained and vitals that have been documented are patient reported.  VideoDeclined- This patient declined Librarian, academic. Therefore the visit was completed with audio only.  Persons Participating in Visit: Patient.  AWV Questionnaire: No: Patient Medicare AWV questionnaire was not completed prior to this visit.  Cardiac Risk Factors include: advanced age (>46men, >27 women);dyslipidemia;hypertension;family history of premature cardiovascular disease      Objective:     Today's Vitals   12/20/23 0835  Weight: 165 lb (74.8 kg)  Height: 5\' 6"  (1.676 m)  PainSc: 0-No pain   Body mass index is 26.63 kg/m.     12/20/2023    8:36 AM 06/01/2023   10:33 AM 04/26/2023   10:11 AM 03/01/2023   11:30 AM 03/01/2023    9:21 AM 01/25/2023    9:52 AM 06/01/2022   10:44 AM  Advanced Directives  Does Patient Have a Medical Advance Directive? No No No No No No No  Would patient like information on creating a medical advance directive? No - Patient declined No - Patient declined No - Patient declined No - Patient declined No - Patient declined No - Patient declined No - Patient declined    Current Medications (verified) Outpatient Encounter Medications as of 12/20/2023  Medication Sig   amLODipine  (NORVASC ) 10 MG tablet TAKE 1 TABLET(10 MG) BY MOUTH DAILY   atorvastatin  (LIPITOR) 40 MG tablet TAKE 1 TABLET(40 MG) BY MOUTH DAILY   diclofenac  Sodium (VOLTAREN  ARTHRITIS PAIN) 1 % GEL Apply 2 g topically 2 (two) times daily as needed.   diclofenac  Sodium (VOLTAREN ) 1 % GEL APPLY 4G TOPICALLY 4 TIMES DAILY   escitalopram  (LEXAPRO ) 20 MG tablet TAKE 1 TABLET(20 MG) BY MOUTH DAILY   furosemide  (LASIX ) 20 MG tablet Take 3 tablets (60 mg total) by mouth daily.   gabapentin  (NEURONTIN ) 600 MG tablet Take 1 tablet (600 mg total) by mouth at bedtime as needed (pain).   methocarbamol  (ROBAXIN ) 750 MG tablet Take 2 tablets (1,500 mg total) by mouth every 8 (eight) hours as needed for muscle spasms.   naproxen  (NAPROSYN ) 500 MG tablet Take 1 tablet (  500 mg total) by mouth 2 (two) times daily with a meal.   potassium chloride  (KLOR-CON ) 10 MEQ tablet Take 1 tablet (10 mEq total) by mouth daily.   traZODone  (DESYREL ) 100 MG tablet Take 0.5-1 tablets (50-100 mg total) by mouth at bedtime as needed for sleep.   No facility-administered encounter medications on file as of 12/20/2023.    Allergies (verified) Contrast media [iodinated contrast media], Penicillins, and  Sulfonamide derivatives   History: Past Medical History:  Diagnosis Date   Hyperlipidemia    Hypertension    Menopause syndrome    Past Surgical History:  Procedure Laterality Date   CHOLECYSTECTOMY N/A 04/05/2017   Procedure: LAPAROSCOPIC CHOLECYSTECTOMY;  Surgeon: Kinsinger, Alphonso Aschoff, MD;  Location: [facility];  Service: General;  Laterality: N/A;   MOUTH SURGERY     TONSILLECTOMY     TRANSTHORACIC ECHOCARDIOGRAM  04/19/2019    EF 66 5%.  Mild LVH.  GR 1 DD.  Normal atrial sizes.  Mild aortic valve sclerosis but no stenosis.  Normal IVC.  Otherwise normal valves.   TUBAL LIGATION     Family History  Problem Relation Age of Onset   Heart disease Mother        "enlarged heart"   Hypertension Mother    Diabetes Mother    Heart disease Maternal Uncle        Bypass surg   Heart attack Maternal Grandmother        also had irregular heart beat requriring a pacemaker.   Hypertension Other        In multiple relatives.    Hypertension Paternal Grandmother    COPD Sister    Sarcoidosis Sister    Heart block Sister        s/p PPM   Hypertension Brother    Hypertension Sister    Arthritis Sister    COPD Sister    Diabetes Sister    Hypertension Sister    Arthritis Sister    Hypertension Brother    Healthy Brother    Healthy Brother    Healthy Sister    Breast cancer Neg Hx    Social History   Socioeconomic History   Marital status: Married    Spouse name: Not on file   Number of children: Not on file   Years of education: HSG   Highest education level: Not on file  Occupational History   Not on file  Tobacco Use   Smoking status: Former   Smokeless tobacco: Never   Tobacco comments:     1 PPD for 2-3 years. Quit ~9629.  Vaping Use   Vaping status: Never Used  Substance and Sexual Activity   Alcohol use: No   Drug use: No   Sexual activity: Yes    Birth control/protection: Surgical  Other Topics Concern   Not on file  Social History Narrative   Lives with  husband. Good relationship, feels safe at home. Studying medical administration.       Current Social History 09/27/2018        Patient lives with spouse in an apartment on the second floor. There are 14 steps with handrails up to the entrance the patient uses.       Patient's method of transportation is personal car.      The highest level of education was high school diploma.      The patient currently is employed as a Engineer, site in patient's homes.      Identified important Relationships  are "My husband, my kids, my grands and great grands."       Pets : None       Interests / Fun: Watching TV, going to beach and park, cooking out with family.       Current Stressors: "finances, teenagers" (grandkids)       Religious / Personal Beliefs: Methodist, "I was raised to treat people the way you want to be treated.          Social Drivers of Corporate investment banker Strain: Low Risk  (12/20/2023)   Overall Financial Resource Strain (CARDIA)    Difficulty of Paying Living Expenses: Not hard at all  Food Insecurity: No Food Insecurity (12/20/2023)   Hunger Vital Sign    Worried About Running Out of Food in the Last Year: Never true    Ran Out of Food in the Last Year: Never true  Transportation Needs: No Transportation Needs (12/20/2023)   PRAPARE - Administrator, Civil Service (Medical): No    Lack of Transportation (Non-Medical): No  Physical Activity: Sufficiently Active (12/20/2023)   Exercise Vital Sign    Days of Exercise per Week: 7 days    Minutes of Exercise per Session: 30 min  Recent Concern: Physical Activity - Insufficiently Active (10/10/2023)   Exercise Vital Sign    Days of Exercise per Week: 1 day    Minutes of Exercise per Session: 10 min  Stress: Stress Concern Present (12/20/2023)   [employer] of Occupational Health - Occupational Stress Questionnaire    Feeling of Stress : To some extent  Social Connections: Moderately Integrated (12/20/2023)    Social Connection and Isolation Panel [NHANES]    Frequency of Communication with Friends and Family: More than three times a week    Frequency of Social Gatherings with Friends and Family: More than three times a week    Attends Religious Services: More than 4 times per year    Active Member of [employer] or Organizations: No    Attends Banker Meetings: Never    Marital Status: Married    Tobacco Counseling Counseling given: Not Answered Tobacco comments:  1 PPD for 2-3 years. Quit ~1610.    Clinical Intake:  Pre-visit preparation completed: Yes  Pain : No/denies pain Pain Score: 0-No pain     BMI - recorded: 26.63 Nutritional Status: BMI 25 -29 Overweight Nutritional Risks: None Diabetes: No  Lab Results  Component Value Date   HGBA1C 6.0 11/22/2012     How often do you need to have someone help you when you read instructions, pamphlets, or other written materials from your doctor or pharmacy?: 1 - Never What is the last grade level you completed in school?: HSG  Interpreter Needed?: No  Information entered by :: Lynkin Saini N. Bernadean Saling, LPN.   Activities of Daily Living     12/20/2023    8:38 AM 04/26/2023   10:11 AM  In your present state of health, do you have any difficulty performing the following activities:  Hearing? 0 0  Vision? 0 0  Difficulty concentrating or making decisions? 0 0  Walking or climbing stairs? 0 0  Dressing or bathing? 0 0  Doing errands, shopping? 0 0  Preparing Food and eating ? N   Using the Toilet? N   In the past six months, have you accidently leaked urine? N   Do you have problems with loss of bowel control? N   Managing your Medications? N  Managing your Finances? N   Housekeeping or managing your Housekeeping? N     Patient Care Team: Lasandra Points, MD as PCP - General (Internal Medicine) Arleen Lacer, MD as PCP - Cardiology (Cardiology) Gaile Jourdain Larene Pleasant, LCSW as [facility] Care Management (Licensed  Clinical Social Worker) Gregoria Leas, OD as Consulting Physician (Optometry)  I have updated your Care Teams any recent Medical Services you may have received from other providers in the past year.     Assessment:    This is a routine wellness examination for Maleny.  Hearing/Vision screen Hearing Screening - Comments:: Patient has adequate hearing.  Vision Screening - Comments:: Adequate vision with use of eyeglasses.  Eye exam done by [facility].   Goals Addressed             This Visit's Progress    12/20/2023: My goal is to keep maintaining my health.         Depression Screen     12/20/2023    8:40 AM 11/13/2023    1:35 PM 10/10/2023    9:34 AM 10/04/2023   11:01 AM 04/26/2023   10:11 AM 03/01/2023   11:29 AM 03/01/2023    9:46 AM  PHQ 2/9 Scores  PHQ - 2 Score 1 2 2 2  0 0 0  PHQ- 9 Score 2 7 7 6  0 0     Fall Risk     12/20/2023    8:38 AM 10/04/2023   10:20 AM 04/26/2023   10:10 AM 03/01/2023   11:30 AM 03/01/2023   11:29 AM  Fall Risk   Falls in the past year? 0 1 1 0 0  Number falls in past yr: 0 0 0 0 0  Injury with Fall? 0 0 0 0 0  Risk for fall due to : No Fall Risks No Fall Risks No Fall Risks No Fall Risks No Fall Risks  Follow up Falls evaluation completed Falls evaluation completed Falls evaluation completed;Falls prevention discussed Falls evaluation completed;Falls prevention discussed Falls evaluation completed;Falls prevention discussed    MEDICARE RISK AT HOME:  Medicare Risk at Home Any stairs in or around the home?: No If so, are there any without handrails?: No Home free of loose throw rugs in walkways, pet beds, electrical cords, etc?: Yes Adequate lighting in your home to reduce risk of falls?: Yes Life alert?: No Use of a cane, walker or w/c?: No Grab bars in the bathroom?: No Shower chair or bench in shower?: No Elevated toilet seat or a handicapped toilet?: No  TIMED UP AND GO:  Was the test performed?   No  Cognitive Function: Declined/Normal: No cognitive concerns noted by patient or family. Patient alert, oriented, able to answer questions appropriately and recall recent events. No signs of memory loss or confusion.    12/20/2023    8:39 AM  MMSE - Mini Mental State Exam  Not completed: Unable to complete        12/20/2023    8:42 AM 01/31/2022    9:39 AM  6CIT Screen  What Year? 0 points 0 points  What month? 0 points 0 points  What time? 0 points 0 points  Count back from 20 0 points 0 points  Months in reverse 0 points 0 points  Repeat phrase 0 points 0 points  Total Score 0 points 0 points    Immunizations Immunization History  Administered Date(s) Administered   Fluad Quad(high Dose 65+) 04/26/2021   Fluad Trivalent(High Dose 65+)  04/26/2023   Influenza Whole 05/14/2007, 06/23/2009, 06/23/2010, 03/17/2011   Influenza,inj,Quad PF,6+ Mos 04/25/2017, 08/20/2018, 03/20/2019   PFIZER(Purple Top)SARS-COV-2 Vaccination 09/07/2019, 10/01/2019, 05/30/2020   PPD Test 09/24/2018, 10/06/2020   Pfizer Covid-19 Vaccine Bivalent Booster 2yrs & up 04/21/2023   Pneumococcal Conjugate-13 08/20/2018   Pneumococcal Polysaccharide-23 12/16/2020   Rsv, Bivalent, Protein Subunit Rsvpref,pf Pattricia Bores) 04/21/2023   Tdap 10/26/2010, 12/08/2021   Zoster Recombinant(Shingrix ) 04/21/2023, 06/21/2023    Screening Tests Health Maintenance  Topic Date Due   COVID-19 Vaccine (5 - 2024-25 season) 06/16/2023   INFLUENZA VACCINE  02/16/2024   Medicare Annual Wellness (AWV)  12/19/2024   MAMMOGRAM  12/29/2024   DTaP/Tdap/Td (3 - Td or Tdap) 12/09/2031   Pneumonia Vaccine 73+ Years old  Completed   DEXA SCAN  Completed   Hepatitis C Screening  Completed   Zoster Vaccines- Shingrix   Completed   HPV VACCINES  Aged Out   Meningococcal B Vaccine  Aged Out   COLON CANCER SCREENING ANNUAL FOBT  Discontinued   Colonoscopy  Discontinued    Health Maintenance  Health Maintenance Due  Topic Date Due    COVID-19 Vaccine (5 - 2024-25 season) 06/16/2023   Health Maintenance Items Addressed: Yes Patient aware of current care gaps.  Immunization record was verified by [facility] and updated in patient's chart.  Additional Screening:  Vision Screening: Recommended annual ophthalmology exams for early detection of glaucoma and other disorders of the eye. Would you like a referral to an eye doctor? No    Dental Screening: Recommended annual dental exams for proper oral hygiene  Community Resource Referral / Chronic Care Management: CRR required this visit?  No   CCM required this visit?  No   Plan:    I have personally reviewed and noted the following in the patient's chart:   Medical and social history Use of alcohol, tobacco or illicit drugs  Current medications and supplements including opioid prescriptions. Patient is not currently taking opioid prescriptions. Functional ability and status Nutritional status Physical activity Advanced directives List of other physicians Hospitalizations, surgeries, and ER visits in previous 12 months Vitals Screenings to include cognitive, depression, and falls Referrals and appointments  In addition, I have reviewed and discussed with patient certain preventive protocols, quality metrics, and best practice recommendations. A written personalized care plan for preventive services as well as general preventive health recommendations were provided to patient.   Margette Sheldon, LPN   07/23/1094   After Visit Summary: (MyChart) Due to this being a telephonic visit, the after visit summary with patients personalized plan was offered to patient via MyChart   Notes: Patient aware of current care gaps.  Immunization record was verified by [facility] and updated in patient's chart.

## 2023-12-20 NOTE — Patient Instructions (Signed)
 Katherine Haynes , Thank you for taking time out of your busy schedule to complete your Annual Wellness Visit with me. I enjoyed our conversation and look forward to speaking with you again next year. I, as well as your care team,  appreciate your ongoing commitment to your health goals. Please review the following plan we discussed and let me know if I can assist you in the future. Your Game plan/ To Do List    Referrals: If you haven't heard from the office you've been referred to, please reach out to them at the phone provided.   Follow up Visits: Next Medicare AWV with our clinical staff: 12/25/2024 at 8:30 a.m. Phone Visit with Health Advisor   Have you seen your provider in the last 6 months (3 months if uncontrolled diabetes)? Yes Next Office Visit with your provider: Patient will call to schedule with new provider  Clinician Recommendations:  Aim for 30 minutes of exercise or brisk walking, 6-8 glasses of water, and 5 servings of fruits and vegetables each day.       This is a list of the screening recommended for you and due dates:  Health Maintenance  Topic Date Due   COVID-19 Vaccine (5 - 2024-25 season) 06/16/2023   Flu Shot  02/16/2024   Medicare Annual Wellness Visit  12/19/2024   Mammogram  12/29/2024   DTaP/Tdap/Td vaccine (3 - Td or Tdap) 12/09/2031   Pneumonia Vaccine  Completed   DEXA scan (bone density measurement)  Completed   Hepatitis C Screening  Completed   Zoster (Shingles) Vaccine  Completed   HPV Vaccine  Aged Out   Meningitis B Vaccine  Aged Out   Stool Blood Test  Discontinued   Colon Cancer Screening  Discontinued    Advanced directives: (Declined) Advance directive discussed with you today. Even though you declined this today, please call our office should you change your mind, and we can give you the proper paperwork for you to fill out. Advance Care Planning is important because it:  [x]  Makes sure you receive the medical care that is consistent with your  values, goals, and preferences  [x]  It provides guidance to your family and loved ones and reduces their decisional burden about whether or not they are making the right decisions based on your wishes.  Follow the link provided in your after visit summary or read over the paperwork we have mailed to you to help you started getting your Advance Directives in place. If you need assistance in completing these, please reach out to us  so that we can help you!  See attachments for Preventive Care and Fall Prevention Tips.

## 2023-12-26 ENCOUNTER — Other Ambulatory Visit: Payer: Self-pay

## 2023-12-26 ENCOUNTER — Other Ambulatory Visit: Payer: Self-pay | Admitting: Licensed Clinical Social Worker

## 2023-12-26 NOTE — Patient Outreach (Signed)
 Complex Care Management   Visit Note  12/26/2023  Name:  Katherine Haynes MRN: [id number] DOB: [birth year]  Situation: Referral received for Complex Care Management related to grief issues faced  I obtained verbal consent from Patient.  Visit completed with patient  on the phone  Background:   Past Medical History:  Diagnosis Date   Hyperlipidemia    Hypertension    Menopause syndrome     Assessment: Patient Reported Symptoms:  Cognitive Cognitive Status: Alert and oriented to person, place, and time Cognitive/Intellectual Conditions Management [RPT]: Not Assessed   Health Maintenance Behaviors: Healthy diet, Hobbies, Sleep adequate Healing Pattern: Unsure Health Facilitated by: Rest  Neurological Neurological Review of Symptoms: Dizziness Neurological Management Strategies: Adequate rest  HEENT HEENT Symptoms Reported: No symptoms reported HEENT Management Strategies: Adequate rest    Cardiovascular Cardiovascular Symptoms Reported: Dizziness, Swelling in legs or feet Cardiovascular Conditions: Hypertension Cardiovascular Management Strategies: Adequate rest  Respiratory Respiratory Symptoms Reported: Shortness of breath Respiratory Conditions: Shortness of breath  Endocrine Patient reports the following symptoms related to hypoglycemia or hyperglycemia : Increased urination, Shortness of breath Endocrine Management Strategies: Adequate rest  Gastrointestinal Gastrointestinal Symptoms Reported: No symptoms reported Gastrointestinal Management Strategies: Adequate rest Nutrition Risk Screen (CP): No indicators present  Genitourinary Genitourinary Symptoms Reported: Frequency Genitourinary Management Strategies: Adequate rest  Integumentary Integumentary Symptoms Reported: No symptoms reported Skin Management Strategies: Adequate rest  Musculoskeletal Musculoskelatal Symptoms Reviewed: Weakness, Muscle pain Musculoskeletal Management Strategies: Adequate rest       Psychosocial Psychosocial Symptoms Reported: Depression - if selected complete PHQ 2-9 Behavioral Health Conditions: Depression Behavioral Management Strategies: Adequate rest, Coping strategies Major Change/Loss/Stressor/Fears (CP): Death of a loved one Techniques to Cope with Loss/Stress/Change: Counseling Quality of Family Relationships: supportive Do you feel physically threatened by others?: No      12/26/2023   11:08 AM  Depression screen PHQ 2/9  Decreased Interest 1  Down, Depressed, Hopeless 1  PHQ - 2 Score 2  Altered sleeping 1  Tired, decreased energy 1  Change in appetite 0  Feeling bad or failure about yourself  1  Trouble concentrating 1  Moving slowly or fidgety/restless 1  Suicidal thoughts 0  PHQ-9 Score 7  Difficult doing work/chores Somewhat difficult    Vitals:   Within normal range, per client  Medications Reviewed Today   Last Reviewed by Suann Elms, LCSW on 12/26/2023 at 11:27 AM    Taking? Last Dose Start Date End Date Provider   amLODipine  (NORVASC ) 10 MG tablet  Taking  01/09/23  --  Sherol Dixie, MD   TAKE 1 TABLET(10 MG) BY MOUTH DAILY   atorvastatin  (LIPITOR) 40 MG tablet  Taking  02/20/23  --  Guilloud, Carolyn, MD   TAKE 1 TABLET(40 MG) BY MOUTH DAILY   diclofenac  Sodium (VOLTAREN  ARTHRITIS PAIN) 1 % GEL  Taking  01/31/22  --  Nguyen, Quan, DO   Apply 2 g topically 2 (two) times daily as needed.   diclofenac  Sodium (VOLTAREN ) 1 % GEL  Taking  02/10/23  --  Guilloud, Carolyn, MD   APPLY 4G TOPICALLY 4 TIMES DAILY   escitalopram  (LEXAPRO ) 20 MG tablet  Taking  02/03/23  --  Guilloud, Carolyn, MD   TAKE 1 TABLET(20 MG) BY MOUTH DAILY   furosemide  (LASIX ) 20 MG tablet  Taking  03/01/23  --  Guilloud, Carolyn, MD   Take 3 tablets (60 mg total) by mouth daily.   gabapentin  (NEURONTIN ) 600 MG tablet  Taking  12/04/23  12/03/24  Driscilla George, MD   Take 1 tablet (600 mg total) by mouth at bedtime as needed (pain).   methocarbamol   (ROBAXIN ) 750 MG tablet  Taking  08/08/22  --  Guilloud, Carolyn, MD   Take 2 tablets (1,500 mg total) by mouth every 8 (eight) hours as needed for muscle spasms.   naproxen  (NAPROSYN ) 500 MG tablet  Taking  12/08/21  --  Lasandra Points, MD   Take 1 tablet (500 mg total) by mouth 2 (two) times daily with a meal.   potassium chloride  (KLOR-CON ) 10 MEQ tablet  Taking  10/16/23  --  Priscella Brooms, DO   Take 1 tablet (10 mEq total) by mouth daily.   traZODone  (DESYREL ) 100 MG tablet  Taking  10/04/23  --  Guilloud, Carolyn, MD   Take 0.5-1 tablets (50-100 mg total) by mouth at bedtime as needed for sleep.   Recommendation:   Client to take medications as prescribed Client to attend scheduled medical appointments Client to consider contacting [facility] for bereavement/grief support Client to use coping skills to help manage grief issues faced Client to allow time for self care and ADLs completion  Follow Up Plan:   Telephone follow up appointment date/time:  01/23/24 at 11:00 AM    Alexandria Angel  MSW, LCSW [place]Value Based [facility] Licensed Clinical Social Worker Direct Dial:  [phone] Fax:  [phone] Website:  [email or web address]

## 2023-12-26 NOTE — Patient Instructions (Signed)
 Visit Information  Thank you for taking time to visit with me today. Please don't hesitate to contact me if I can be of assistance to you before our next scheduled appointment.  Our next appointment is by telephone on 01/23/24 at 11:00 AM   Please call the care guide team at [phone] if you need to cancel or reschedule your appointment.   Following is a copy of your care plan:   Goals Addressed             This Visit's Progress    VBCI Social Work Care Plan       Problems:   Grief issues faced             Depression issues              Insomnia              Vertigo             HTN              CSW Clinical Goal(s):   Over the next 30  days the Patient will attend all scheduled medical appointments as evidenced by patient report and care team review of appointment completion in electronic MEDICAL RECORD NUMBER.           Over next 30 days, the Patient will use coping skills to help her mange grief issues experienced AEB client report of reduction in grief symptoms faced  Interventions:  Discussed grief issues of client related to death of her son               Encouraged client to consider contacting [employer] for bereavement support services. She said she was familiar with [employer].                         Discussed pain issues of client             Discussed medication procurement of client             Discussed client transport needs. She drives her car as needed             Discussed family support of client. Client said her family is very supportive. She has support of her spouse and of her two sisters             Discussed program support with RN, LCSW, Pharmacist             Discussed grief issues of client. Provided some counseling support             Discussed hobbies: listens to music, talks via phone with friends                         Discussed support of LCSW. Encouraged Aariyana to call LCSW as needed for SW support at [phone]  Patient Goals/Self-Care  Activities:  Take medications as prescribed              Attend medical appointments as scheduled              Allow time for self care (adequate rest, eating meals on schedule, allowing time for socializing with friends)              Call LCSW as needed to discuss SW needs of client              Talk with PCP about  her medical needs  Plan:   Telephone follow up appointment with care management team member scheduled for:  07/08//25 at 11:00 AM        Please go to [facility] [address] [place] [phone]) if you are experiencing a Mental Health or Behavioral Health Crisis or need someone to talk to.  The patient verbalized understanding of instructions, educational materials, and care plan provided today and DECLINED offer to receive copy of patient instructions, educational materials, and care plan.     Alexandria Angel  MSW, LCSW [place] Licensed Clinical Social Worker Direct Dial:  [phone] Fax:  [phone] Website:  [email or web address]

## 2024-01-08 ENCOUNTER — Ambulatory Visit: Payer: Self-pay

## 2024-01-08 ENCOUNTER — Other Ambulatory Visit: Payer: Self-pay | Admitting: Internal Medicine

## 2024-01-08 ENCOUNTER — Encounter: Payer: Self-pay | Admitting: Internal Medicine

## 2024-01-08 DIAGNOSIS — R600 Localized edema: Secondary | ICD-10-CM

## 2024-01-08 MED ORDER — FUROSEMIDE 20 MG PO TABS: 60.0000 mg | ORAL_TABLET | Freq: Every day | ORAL | 1 refills | Status: DC

## 2024-01-08 NOTE — Telephone Encounter (Signed)
 This encounter was created in error - please disregard.

## 2024-01-08 NOTE — Telephone Encounter (Signed)
 FYI Only or Action Required?: FYI only for provider.  Patient was last seen in primary care on 10/04/2023 by Forest Coy, MD. Called Nurse Triage reporting Insomnia. Symptoms began several months ago. Interventions attempted: Prescription medications: Trazadone. Symptoms are: unchanged.  Triage Disposition: See PCP Within 2 Weeks  Patient/caregiver understands and will follow disposition?: Yes   Symptoms worsening since discussed with her PCP Having a sleep issue 10/12/2023 her son passed away and dealing with grief , May need a referral Patient call back [phone]   Reason for Disposition  Insomnia is an ongoing problem (> 2 weeks)  Answer Assessment - Initial Assessment Questions 1. DESCRIPTION: Tell me about your sleeping problem.      It varies, sometimes patient cannot stay asleep or has trouble falling asleep  2. ONSET: How long have you been having trouble sleeping? (e.g., days, weeks, months)     Since 04-Oct-2023 (since son passed away)  3. RECURRENT: Have you had sleeping problems before?  If Yes, ask: What happened that time? What helped your sleeping problem go away in the past?      Prior to 10/04/2023 had not had sleeping issues  4. STRESS: Is there anything in your life that is making you feel stressed or tense?     Yes, son passed away in 03-19-25and sister died the next week  5. PAIN: Do you have any pain that is keeping you awake? (e.g., back pain, headache, abdomen pain)     headaches  6. CAFFEINE ABUSE: Do you drink caffeinated beverages, and how much each day? (e.g., coffee, tea, colas)     Drinks caffeine, cup of coffee in the morning. Drinks Ginger Ale Zero as well  7. ALCOHOL USE OR SUBSTANCE USE (DRUG USE): Do you drink alcohol or use any illegal drugs?     No  8. OTHER SYMPTOMS: Do you have any other symptoms?  (e.g., difficulty breathing)     Irritable, having a hard time coping   Patient previously prescribed Trazadone 100mg ,  stopped taking it because it bmakes her feel drowsy.  This RN advised patient that she can take 50mg  by breaking the tablet in half. Patient says that she didn't realized she can break the tablet in half, she says she will try that this evening. But she will still want to schedule an appt .  Protocols used: Insomnia-A-AH

## 2024-01-08 NOTE — Telephone Encounter (Unsigned)
 Copied from CRM [id number]. Topic: Clinical - Medication Refill >> Jan 08, 2024 10:36 AM Alfonso ORN wrote: Medication:  Disp Refills Start End  furosemide  (LASIX ) 20 MG tablet       Has the patient contacted their pharmacy? No (Agent: If no, request that the patient contact the pharmacy for the refill. If patient does not wish to contact the pharmacy document the reason why and proceed with request.) (Agent: If yes, when and what did the pharmacy advise?)  This is the patient's preferred pharmacy:  [id number] [address],  - [address] AT [facility] [address] [place] [address] Phone: [phone] Fax: [phone]  Is this the correct pharmacy for this prescription? Yes If no, delete pharmacy and type the correct one.   Has the prescription been filled recently? No  Is the patient out of the medication? Yes been out for 2 days   Has the patient been seen for an appointment in the last year OR does the patient have an upcoming appointment? Yes   Can we respond through MyChart? Yes and a phone call   Agent: Please be advised that Rx refills may take up to 3 business days. We ask that you follow-up with your pharmacy.

## 2024-01-08 NOTE — Addendum Note (Signed)
 Addended by: Donika Butner on: 01/08/2024 01:16 PM   Modules accepted: Orders

## 2024-01-08 NOTE — Telephone Encounter (Signed)
 Reason for Disposition . [1] Prescription refill request for ESSENTIAL medicine (i.e., likelihood of harm to patient if not taken) AND [2] triager unable to refill per department policy  Answer Assessment - Initial Assessment Questions 1. DRUG NAME: What medicine do you need to have refilled?     Furosemide   2. REFILLS REMAINING: How many refills are remaining? (Note: The label on the medicine or pill bottle will show how many refills are remaining. If there are no refills remaining, then a renewal may be needed.)     None-out of med since Friday  3. SYMPTOMS: Do you have any symptoms?     Ankle swelling  Protocols used: Medication Refill and Renewal Call-A-AH

## 2024-01-08 NOTE — Telephone Encounter (Signed)
 Unable to refill rx, rx keeps reverting back to print instead of normal.

## 2024-01-08 NOTE — Telephone Encounter (Addendum)
 2nd time to call patient, she states she already spoke with a nurse who called her back. Verified nurse triage documentation is chart review, see other triage encounter. Patient also requesting to make sure refill for furosemide  is filled, she has been out since Friday. Mild puffiness to ankles, denies need for triage for edema.   1st attempt, lvmtcb  Copied from CRM [id number]. Topic: Clinical - Pink Word Triage >> Jan 08, 2024 10:32 AM Alfonso ORN wrote: Reason for Triage: Having a sleep issue  03-Oct-2023 son passed away having grief , May need a referral  [phone] >> Jan 08, 2024 10:34 AM Alfonso ORN wrote: Symptoms worsening since discussed with her PCP Having a sleep issue  03-Oct-2023 her son passed away and dealing with grief ,  May need a referral  Patient call back [phone]

## 2024-01-15 ENCOUNTER — Ambulatory Visit: Payer: Self-pay

## 2024-01-15 NOTE — Progress Notes (Unsigned)
   Established Patient Office Visit  Subjective   Patient ID: Katherine Haynes, female    DOB: [birth year]  Age: 73 y.o. MRN: [id number]  No chief complaint on file.  Katherine Haynes is a pleasant 73 year old female with a past medical history of MDD, insomnia, HTN, *** who presents to clinic today for ***.   In March 2025, her son unexpectedly passed away following complications from the flu. It was a traumatic experience for the patient as she found him unresponsive and performed CPR until paramedics arrived on scene where he was resuscitated but passed away in the hospital the following day. She is now the primary caregiver of her grandchildren. She has been utilizing Lexapro  20 mg daily which she reports manages her mood well***. Following the grief reaction of her son's passing, her trazodone  was increased to 50mg -100 mg as needed at bedtime. She reports *** improved sleep.  - PHQ-9 today of ***.  - behavioral health referral?  For her hypertension, she has been *** controlled on current regimen with Amlodipine  10 mg daily. She also takes furosemide  for venous insufficiency swelling. Years ago she was previously trialed on Lisinopril  then losartan  subsequently, though both were discontinued due to adverse reactions including dry cough and episodes of hypotension and dizziness.   HLD LDL from one year ago at goal for primary prevention. She takes Lipitor 40 mg daily.  - order lipid panel   - med rec - sleeping - grief check-in - HTN home readings  - swelling of legs    {History (Optional):23778}   ***ROS: Denies headaches, dizziness, fever, chills, runny nose, sore throat, vision changes, hearing changes, chest pain, shortness of breath, difficulty breathing, nausea, vomiting, abdominal pain. Denies increased urinary frequency, pain with urination, constipation or diarrhea. No recent falls.       Objective:     LMP 10/07/1990  {Vitals History (Optional):23777}  Physical  Exam  Constitutional: well-appearing *** sitting in exam chair, in no acute distress. Ambulates without use of assistance device *** HEENT: normocephalic atraumatic, mucous membranes moist Eyes: conjunctiva non-erythematous Neck: supple Cardiovascular: regular rate and rhythm, bilateral radial pulses 2+, bilateral dorsal pedal pulses 2+, brisk capillary refill bilateral feet and hands  Pulmonary/Chest: normal work of breathing on room air, lungs clear to auscultation bilaterally Abdominal: soft, non-tender, non-distended MSK: normal bulk and tone. Neurological: alert & oriented x 3, sensation intact bilateral feet to monofilament*** Skin: warm and dry, no ulcers or lesions on bilateral feet*** Psych: mood calm, behavior normal, thought content normal, judgement normal    No results found for any visits on 01/16/24.  {Labs (Optional):23779}  The ASCVD Risk score (Arnett DK, et al., 2019) failed to calculate for the following reasons:   The systolic blood pressure is missing    Assessment & Plan:   Problem List Items Addressed This Visit   None   No follow-ups on file.    Doyal Miyamoto, MD PGY-1 Internal Medicine

## 2024-01-16 ENCOUNTER — Ambulatory Visit (INDEPENDENT_AMBULATORY_CARE_PROVIDER_SITE_OTHER)

## 2024-01-16 VITALS — BP 128/77 | HR 79 | Temp 97.4°F | Ht 66.0 in | Wt 151.4 lb

## 2024-01-16 DIAGNOSIS — E785 Hyperlipidemia, unspecified: Secondary | ICD-10-CM

## 2024-01-16 DIAGNOSIS — F321 Major depressive disorder, single episode, moderate: Secondary | ICD-10-CM

## 2024-01-16 DIAGNOSIS — I10 Essential (primary) hypertension: Secondary | ICD-10-CM

## 2024-01-16 DIAGNOSIS — F5101 Primary insomnia: Secondary | ICD-10-CM

## 2024-01-16 DIAGNOSIS — F4320 Adjustment disorder, unspecified: Secondary | ICD-10-CM | POA: Diagnosis not present

## 2024-01-16 DIAGNOSIS — E782 Mixed hyperlipidemia: Secondary | ICD-10-CM

## 2024-01-16 DIAGNOSIS — E876 Hypokalemia: Secondary | ICD-10-CM | POA: Diagnosis not present

## 2024-01-16 DIAGNOSIS — R600 Localized edema: Secondary | ICD-10-CM

## 2024-01-16 DIAGNOSIS — I890 Lymphedema, not elsewhere classified: Secondary | ICD-10-CM

## 2024-01-16 DIAGNOSIS — G4700 Insomnia, unspecified: Secondary | ICD-10-CM

## 2024-01-16 MED ORDER — ATORVASTATIN CALCIUM 40 MG PO TABS: 40.0000 mg | ORAL_TABLET | Freq: Every day | ORAL | 3 refills | Status: AC

## 2024-01-16 MED ORDER — AMLODIPINE BESYLATE 10 MG PO TABS: 10.0000 mg | ORAL_TABLET | Freq: Every day | ORAL | 3 refills | Status: AC

## 2024-01-16 MED ORDER — RAMELTEON 8 MG PO TABS: 8.0000 mg | ORAL_TABLET | Freq: Every evening | ORAL | 3 refills | Status: AC | PRN

## 2024-01-16 NOTE — Patient Instructions (Addendum)
 It was very nice meeting you today [name]! As we discussed, we will discontinue the trazodone  and trial ramelteon which has been sent to your pharmacy. I included some sleep hygiene reminders below as well. We have placed the referral to Renda Pontes, our behavioral therapist who should be reaching out to you to schedule an appointment. We would like to see you back in our clinic in about 3 months, or sooner if you need. Please do not hesitate to contact us  with any questions or concerns!  - Dr. Leontine    Practice Good Sleep Hygiene Here are some suggestions Avoid napping during the day. It can disturb the normal pattern of sleep and wakefulness.  Avoid stimulants such as caffeine, nicotine, and alcohol too close to bedtime. While alcohol is well known to speed the onset of sleep, it disrupts sleep in the second half as the body begins to metabolize the alcohol, causing arousal.  Exercise can promote good sleep. Vigorous exercise should be taken in the morning or late afternoon. A relaxing exercise, like yoga, can be done before bed to help initiate a restful night's sleep. Food can be disruptive right before sleep. Stay away from large meals close to bedtime. Also dietary changes can cause sleep problems, if someone is struggling with a sleep problem, it's not a good time to start experimenting with spicy dishes. And, remember, chocolate has caffeine.  Ensure adequate exposure to natural light. This is particularly important for older people who may not venture outside as frequently as children and adults. Light exposure helps maintain a healthy sleep-wake cycle.  Establish a regular relaxing bedtime routine. Try to avoid emotionally upsetting conversations and activities before trying to go to sleep. Don't dwell on, or bring your problems to bed.  Associate your bed with sleep. It's not a good idea to use your bed to watch TV, listen to the radio, or read.  Make sure that the sleep environment is  pleasant and relaxing. The bed should be comfortable, the room should not be too hot or cold, or too bright.

## 2024-01-16 NOTE — Assessment & Plan Note (Signed)
 LDL from one year ago at goal for primary prevention. She takes Lipitor 40 mg daily. Will order repeat lipid panel today and reach out to her on MyChart with lab results.    - order lipid panel  - refill Lipitor 40 mg daily

## 2024-01-16 NOTE — Assessment & Plan Note (Signed)
 Hypertension currently well controlled on current regimen of Amlodipine  10 mg daily. Years ago she was previously trialed on Lisinopril  then losartan  subsequently, though both were discontinued due to adverse reactions including dry cough and episodes of hypotension and dizziness. She does have a BP cuff at home.  - refill Amlodipine  10 mg daily

## 2024-01-16 NOTE — Progress Notes (Signed)
 Internal Medicine Clinic Attending  I was physically present during the key portions of the resident provided service and participated in the medical decision making of patient's management care. I reviewed pertinent patient test results.  The assessment, diagnosis, and plan were formulated together and I agree with the documentation in the resident's note.  Lovie Clarity, MD    Agree with plan to trial Ramelteon & CBT with [facility] for insomnia. Stop Trazodone .

## 2024-01-16 NOTE — Assessment & Plan Note (Signed)
 Recently received refill of furosemide . She currently has L > R 1+ nonpitting edema bilaterally on physical exam. She does have compression socks, but has not been using them while it's been so hot. Will recheck BMP today and let patient know results via MyChart. She currently takes KCl 10 mEq daily.  - order BMP

## 2024-01-16 NOTE — Assessment & Plan Note (Signed)
 Patient continues to report difficulty sleeping at night. She winds down and shuts off all screens about 1 hour before bed, and tries to go to bed around the same time nightly. She has tried Melatonin previously which did not help her insomnia. Most recent trial of trazodone  caused her to feel groggy for the entire next day following, leaving her with difficulty to perform her daily tasks. Given her age and getting up in the middle of the night to use the bathroom 1-2 times nightly, I am more hesitant to begin zolpidem . Will trial Ramelteon 8 mg to begin with, in hopes that this mitigates her grogginess the next day. Her insomnia has been closely linked to the recent losses and stressors in her life. Today PHQ-9 of 8. She is stable with her mood on Lexapro  20 mg daily, but has not yet seen a behavioral health specialist for CBT. Patient is amenable to beginning CBT.  - Ramelteon 8 mg nightly prn  - behavioral health referral to Renda Pontes

## 2024-01-17 ENCOUNTER — Ambulatory Visit: Payer: Self-pay

## 2024-01-17 LAB — BMP8+ANION GAP
Anion Gap: 17 mmol/L (ref 10.0–18.0)
BUN/Creatinine Ratio: 9 — ABNORMAL LOW (ref 12–28)
BUN: 8 mg/dL (ref 8–27)
CO2: 24 mmol/L (ref 20–29)
Calcium: 9.6 mg/dL (ref 8.7–10.3)
Chloride: 99 mmol/L (ref 96–106)
Creatinine, Ser: 0.92 mg/dL (ref 0.57–1.00)
Glucose: 109 mg/dL — ABNORMAL HIGH (ref 70–99)
Potassium: 4 mmol/L (ref 3.5–5.2)
Sodium: 140 mmol/L (ref 134–144)
eGFR: 67 mL/min/{1.73_m2} (ref >59)

## 2024-01-17 LAB — LIPID PANEL
Chol/HDL Ratio: 2 ratio (ref 0.0–4.4)
Cholesterol, Total: 171 mg/dL (ref 100–199)
HDL: 85 mg/dL (ref >39)
LDL Chol Calc (NIH): 73 mg/dL (ref 0–99)
Triglycerides: 71 mg/dL (ref 0–149)
VLDL Cholesterol Cal: 13 mg/dL (ref 5–40)

## 2024-01-23 ENCOUNTER — Other Ambulatory Visit: Payer: Self-pay

## 2024-01-23 ENCOUNTER — Other Ambulatory Visit: Payer: Self-pay | Admitting: Licensed Clinical Social Worker

## 2024-01-23 NOTE — Patient Instructions (Signed)
 Visit Information  Thank you for taking time to visit with me today. Please don't hesitate to contact me if I can be of assistance to you before our next scheduled appointment.  Our next appointment is by telephone on 02/27/24 at 9:30 AM   Please call the care guide team at [phone] if you need to cancel or reschedule your appointment.   Following is a copy of your care plan:   Goals Addressed             This Visit's Progress    VBCI Social Work Care Plan       Problems:   Grief issues faced             Depression issues              Insomnia              Vertigo             HTN              CSW Clinical Goal(s):   Over the next 30  days the Patient will attend all scheduled medical appointments as evidenced by patient report and care team review of appointment completion in electronic MEDICAL RECORD NUMBER.           Over next 30 days, the Patient will use coping skills to help her mange grief issues experienced AEB client report of reduction in grief symptoms faced  Interventions:  Discussed grief issues of client related to death of her son               Encouraged client to consider contacting [employer] for bereavement support services. She said she was familiar with [employer].   LCSW described to client the support services with [employer].              LCSW talked with client about local church affiliation and that she perhaps also could talk with her Juliene about grief issues experienced.                      Discussed pain issues of client             Discussed medication procurement of client             Discussed client transport needs. She drives her car as needed             Discussed family support of client. Client said her family is very supportive. She has support of her spouse and of her two sisters. She said her spouse is doing well with his health and is supportive to her             Discussed program support with RN, LCSW, Pharmacist              Discussed grief issues of client. Provided some counseling support. Discussed grief symptoms commonly experienced.              Discussed hobbies: listens to music, talks via phone with friends , plays games on the phone.                        Discussed support of LCSW. Encouraged Beckie to call LCSW as needed for SW support at [phone]               Patient Goals/Self-Care Activities:  Take medications as prescribed  Attend medical appointments as scheduled              Allow time for self care (adequate rest, eating meals on schedule, allowing time for socializing with friends)              Call LCSW as needed to discuss SW needs of client              Talk with PCP about her medical needs              Call [employer] as needed to discuss grief support services for client  Plan:   Telephone follow up appointment with care management team member scheduled for:  02/27/24 at 9:30 AM         Please go to [facility] [address], [place] [phone]) if you are experiencing a Mental Health or Behavioral Health Crisis or need someone to talk to.  The patient verbalized understanding of instructions, educational materials, and care plan provided today and DECLINED offer to receive copy of patient instructions, educational materials, and care plan.    Katherine Haynes  MSW, LCSW [place] Licensed Clinical Social Worker Direct Dial:  [phone] Fax:  [phone] Website:  [email or web address]

## 2024-01-23 NOTE — Patient Outreach (Signed)
 Complex Care Management   Visit Note  01/23/2024  Name:  Katherine Haynes MRN: [id number] DOB: [birth year]  Situation: Referral received for Complex Care Management related to grief issues management I obtained verbal consent from Patient.  Visit completed with patient  on the phone  Background:   Past Medical History:  Diagnosis Date   BPPV (benign paroxysmal positional vertigo) 12/08/2021   Healthcare maintenance 04/25/2017   Hyperlipidemia    Hypertension    LBBB (left bundle branch block) 10/26/2020   Beta blocker discontinued June of 2022     Menopause syndrome     Assessment: Patient Reported Symptoms:  Cognitive Cognitive Status: Alert and oriented to person, place, and time Cognitive/Intellectual Conditions Management [RPT]: None reported or documented in medical history or problem list   Health Maintenance Behaviors: Exercise, Social activities  Neurological Neurological Review of Symptoms: Weakness, Dizziness Neurological Management Strategies: Coping strategies  HEENT HEENT Symptoms Reported:  (uses reading glasses) HEENT Management Strategies: Coping strategies    Cardiovascular  HTN; short of breath occasionally    Respiratory Respiratory Symptoms Reported: Shortness of breath Respiratory Management Strategies: Coping strategies  Endocrine Endocrine Symptoms Reported: Increased urination Is patient diabetic?: No    Gastrointestinal    No problems mentioned by client    Genitourinary Genitourinary Symptoms Reported: Frequency Additional Genitourinary Details: Takes a diurectic Genitourinary Management Strategies: Coping strategies  Integumentary Integumentary Symptoms Reported: No symptoms reported Skin Management Strategies: Coping strategies  Musculoskeletal Musculoskelatal Symptoms Reviewed: Weakness Musculoskeletal Management Strategies: Coping strategies      Psychosocial Psychosocial Symptoms Reported: Depression - if selected complete PHQ  2-9 Behavioral Management Strategies: Coping strategies, Adequate rest Major Change/Loss/Stressor/Fears (CP): Medical condition, self Techniques to Cope with Loss/Stress/Change: Counseling Quality of Family Relationships: supportive      01/23/2024   11:00 AM  Depression screen PHQ 2/9  Decreased Interest 1  Down, Depressed, Hopeless 1  PHQ - 2 Score 2  Altered sleeping 1  Tired, decreased energy 1  Change in appetite 1  Feeling bad or failure about yourself  1  Trouble concentrating 0  Moving slowly or fidgety/restless 1  Suicidal thoughts 0  PHQ-9 Score 7  Difficult doing work/chores Somewhat difficult    Vitals:   BP 128/77 at PCP office visit on 01/16/24  Medications Reviewed Today     Reviewed by Frances Ozell RAMAN, LCSW (Social Worker) on 01/23/24 at 1050  Med List Status: <None>   Medication Order Taking? Sig Documenting Provider Last Dose Status Informant  amLODipine  (NORVASC ) 10 MG tablet [id number] Yes Take 1 tablet (10 mg total) by mouth daily. Nguyen, Diana, MD  Active   atorvastatin  (LIPITOR) 40 MG tablet [id number] Yes Take 1 tablet (40 mg total) by mouth daily. Nguyen, Diana, MD  Active   diclofenac  Sodium (VOLTAREN ) 1 % GEL [id number] Yes APPLY 4G TOPICALLY 4 TIMES DAILY Guilloud, Carolyn, MD  Active   escitalopram  (LEXAPRO ) 20 MG tablet [id number] Yes TAKE 1 TABLET(20 MG) BY MOUTH DAILY Guilloud, Carolyn, MD  Active   furosemide  (LASIX ) 20 MG tablet [id number] Yes Take 3 tablets (60 mg total) by mouth daily. Norrine Ozell, MD  Active   gabapentin  (NEURONTIN ) 600 MG tablet [id number] Yes Take 1 tablet (600 mg total) by mouth at bedtime as needed (pain). Lovie Clarity, MD  Active   methocarbamol  (ROBAXIN ) 750 MG tablet [id number] Yes Take 2 tablets (1,500 mg total) by mouth every 8 (eight) hours as needed for muscle spasms. Guilloud, Carolyn, MD  Active   naproxen  (  NAPROSYN ) 500 MG tablet [id number] Yes Take 1 tablet (500 mg total) by mouth 2 (two) times daily with a  meal. Forest Coy, MD  Active   potassium chloride  (KLOR-CON ) 10 MEQ tablet [id number] Yes Take 1 tablet (10 mEq total) by mouth daily. Rosan [place] BROCKS, DO  Active   ramelteon  (ROZEREM ) 8 MG tablet [id number] Yes Take 1 tablet (8 mg total) by mouth at bedtime as needed for sleep. Nguyen, Diana, MD  Active   traZODone  (DESYREL ) 100 MG tablet [id number] Yes Take 0.5-1 tablets (50-100 mg total) by mouth at bedtime as needed for sleep. Guilloud, Carolyn, MD  Active             Recommendation:   PCP Follow-up Continue Current Plan of Care Take medications as prescribed Call [facility] as needed for grief support information from that agency Call LCSW as needed for SW support  Follow Up Plan:   Telephone follow up appointment date/time:  02/27/24 at 9:30 AM   Glendia Pear  MSW, LCSW [place] Licensed Clinical Social Worker Direct Dial:  [phone] Fax:  [phone] Website:  [email or web address]

## 2024-02-06 ENCOUNTER — Ambulatory Visit: Payer: Self-pay | Admitting: Licensed Clinical Social Worker

## 2024-02-06 DIAGNOSIS — F4321 Adjustment disorder with depressed mood: Secondary | ICD-10-CM | POA: Diagnosis not present

## 2024-02-06 DIAGNOSIS — F411 Generalized anxiety disorder: Secondary | ICD-10-CM
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
## 2024-02-13 ENCOUNTER — Ambulatory Visit: Admitting: Licensed Clinical Social Worker

## 2024-02-13 DIAGNOSIS — F411 Generalized anxiety disorder: Secondary | ICD-10-CM

## 2024-02-13 DIAGNOSIS — F4321 Adjustment disorder with depressed mood: Secondary | ICD-10-CM

## 2024-02-13 NOTE — Telephone Encounter (Signed)
 Spoke with the pt and she has requested today's appt to be a telehealth instead of an in person visit.  Copied from CRM #[id number]. Topic: General - Other >> Feb 13, 2024  8:32 AM Farrel B wrote: Reason for CRM: patient has called to speak to Ms. Renda Pontes, patient stated she has an appointment with Ms. Ashe at 10 but needed to speak with her before she gets to the appointment. Please call patient at [phone]
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
## 2024-02-20 ENCOUNTER — Other Ambulatory Visit: Payer: Self-pay

## 2024-02-20 DIAGNOSIS — F321 Major depressive disorder, single episode, moderate: Secondary | ICD-10-CM

## 2024-02-20 MED ORDER — ESCITALOPRAM OXALATE 20 MG PO TABS: 20.0000 mg | ORAL_TABLET | Freq: Every day | ORAL | 3 refills | Status: AC

## 2024-02-22 ENCOUNTER — Ambulatory Visit: Admitting: Licensed Clinical Social Worker

## 2024-02-22 DIAGNOSIS — F411 Generalized anxiety disorder: Secondary | ICD-10-CM

## 2024-02-22 NOTE — BH Specialist Note (Signed)
 Integrated Behavioral Health Follow Up In-Person Visit  MRN: [id number] Name: Katherine Haynes  Number of Integrated Behavioral Health Clinician visits: Additional Visit  Session Start time: [phone]   Session End time: 0945  Total time in minutes: 60    Types of Service: General Behavioral Integrated Care (BHI)  Interpretor:No. Interpretor Name and Language: N/A  Subjective: Katherine Haynes is a 73 y.o. female  Patient was referred by PCP for Counseling. Patient reports the following symptoms/concerns: LCSW-A conducted session on today. Patient was in good spirits and engaging throughout session. Session included discussion of recent family changes, relationship conflict and grief. Patient finds it beneficial to speak with LCSW-A and coping skills were reviewed. Additionally patient will join the [employer] program with the [facility] and continue working on re framing CBT skills discussed.  Duration of problem: Less than a year; Severity of problem: mild  Objective: Mood: NA and Affect: Appropriate Risk of harm to self or others: No plan to harm self or others  Patient and/or Family's Strengths/Protective Factors: Social connections  Goals Addressed: Patient will:  Reduce symptoms of: anxiety   Progress towards Goals: Ongoing  Interventions: Interventions utilized:  CBT Cognitive Behavioral Therapy Standardized Assessments completed: Patient declined screening      Patient and/or Family Response: Patient agrees to ongoing treatment  Patient Centered Plan: Patient is on the following Treatment Plan(s): Bi-Weekly Counseling  Clinical Assessment/Diagnosis  Generalized anxiety disorder    Assessment: Patient currently experiencing Anxiety/ Grief and Family Stress.   Patient may benefit from Grief Counseling, individual counseling .  Plan: Follow up with behavioral health clinician on : 09/04 Renda Pontes, MSW, LCSW-A She/Her Behavioral Health Clinician

## 2024-02-27 ENCOUNTER — Other Ambulatory Visit: Payer: Self-pay

## 2024-02-27 ENCOUNTER — Other Ambulatory Visit: Payer: Self-pay | Admitting: Licensed Clinical Social Worker

## 2024-02-27 NOTE — Patient Instructions (Signed)
 Visit Information  Thank you for taking time to visit with me today. Please don't hesitate to contact me if I can be of assistance to you before our next scheduled appointment.  Our next appointment is by telephone on 04/08/24 at 11:00 AM   Please call the care guide team at [phone] if you need to cancel or reschedule your appointment.   Following is a copy of your care plan:   Goals Addressed             This Visit's Progress    VBCI Social Work Care Plan       Problems:   Grief issues faced             Depression issues              Insomnia (varies)              Vertigo             HTN              CSW Clinical Goal(s):    Over the next 30  days the Patient will attend all scheduled medical appointments as evidenced by patient report and care team review of appointment completion in electronic MEDICAL RECORD NUMBER.           Over next 30 days, the Patient will use coping skills learned  to help her manage grief issues experienced AEB client report of reduction in grief symptoms faced. Client sees Katherine Pontes LCSW with Integrated Behavioral Health.   Interventions:  Discussed grief issues of client related to death of her son               Client has been receiving counseling support with Katherine Pontes, LCSW with Integrated Behavioral Health. Client feels that these sessions with counselor have been helpful to her.             Discussed pain issues of client             Discussed medication procurement of client             Discussed client transport needs. She drives her car as needed             Discussed family support of client. Client said her family is very supportive. She has support of her spouse and of her two sisters. She said her spouse is doing well with his health and is supportive to her. She also said she has some support from her grandson.             Discussed program support with RN, LCSW, Pharmacist             Congratulated client on her participating in  counseling sessions with Katherine Pontes LCSW of [facility].            Discussed support of LCSW, Katherine Haynes  Encouraged Katherine Haynes to call LCSW Katherine Haynes  as needed for SW support at [phone]               Patient Goals/Self-Care Activities:  Take medications as prescribed              Attend medical appointments as scheduled              Allow time for self care (adequate rest, eating meals on schedule, allowing time for socializing with friends)  Call LCSW Katherine Haynes as needed to discuss SW needs of client              Talk with PCP about her medical needs              Participate in scheduled counseling sessions with Katherine Pontes, LCSW with Integrated Behavioral Health.    Plan:   Telephone follow up appointment with care management team member scheduled for:  04/08/24 at 11:00 AM         Please go to [facility] [address] [place] [id number]) if you are experiencing a Mental Health or Behavioral Health Crisis or need someone to talk to.  The patient verbalized understanding of instructions, educational materials, and care plan provided today and DECLINED offer to receive copy of patient instructions, educational materials, and care plan.    Katherine Haynes  MSW, LCSW [place] Licensed Clinical Social Worker Direct Dial:  [phone] Fax:  [phone] Website:  [email or web address]

## 2024-02-27 NOTE — Patient Outreach (Signed)
 Complex Care Management   Visit Note  02/27/2024  Name:  Katherine Haynes MRN: [id number] DOB: October 22, 1950  Situation: Referral received for Complex Care Management related to grief issues faced, counseling needs I obtained verbal consent from Patient.  Visit completed with patient   on the phone  Background:   Past Medical History:  Diagnosis Date   BPPV (benign paroxysmal positional vertigo) 12/08/2021   Healthcare maintenance 04/25/2017   Hyperlipidemia    Hypertension    LBBB (left bundle branch block) 10/26/2020   Beta blocker discontinued June of 2022     Menopause syndrome     Assessment: Patient Reported Symptoms:  Cognitive  No memory problems, per client information. She is alert to person , place and time  Health Management: Uses calendar to keep track of her appointments    Neurological  Major Depression; GAD: Grief reaction (she sees Maryrose Pontes, LCSW at [facility] for counseling support)  Health Management: Uses coping skills learned through CBT therapy support  HEENT  No symptoms noted  She does wear glasses to help her with vision    Cardiovascular    HTN; takes medications as prescribed  Respiratory  Allergies  Coping skills: rest breaks, relaxation, stress reduction activities  Endocrine  Fatigue occasionally Coping skills: rest, relaxation    Gastrointestinal    No issues noted by client. Takes medications as prescribed  Follows health diet   Genitourinary  No issues noted by client . No problems mentioned by client: Medical management: coping skills, rest, relaxation, proper diet, exercise    Integumentary  No skin issues mentioned by client. Coping skills: rest, relaxation, diversional activities, routine medical screening     Musculoskeletal    Some muscle aches; some joint pain.  Walks carefully. No recent falls. Tries to get adequate sleep at night       Psychosocial    GAD: Depression; Grief reaction at death of her son. She  sees Renda Pontes , [place] as counselor with [facility] Coping activities: spends time with family and her grandson; likes to listen to music and likes to go to church activities.            02/27/2024    2:14 PM  Depression screen PHQ 2/9  Decreased Interest 1  Down, Depressed, Hopeless 1  PHQ - 2 Score 2  Altered sleeping 1  Tired, decreased energy 1  Change in appetite 1  Feeling bad or failure about yourself  1  Trouble concentrating 0  Moving slowly or fidgety/restless 1  Suicidal thoughts 0  PHQ-9 Score 7  Difficult doing work/chores Somewhat difficult    There were no vitals filed for this visit.  Medications Reviewed Today     Reviewed by Frances Ozell GORMAN KEN (Social Worker) on 02/27/24 at 1413  Med List Status: <None>   Medication Order Taking? Sig Documenting Provider Last Dose Status Informant  amLODipine  (NORVASC ) 10 MG tablet [id number] Yes Take 1 tablet (10 mg total) by mouth daily. Nguyen, Diana, MD  Active   atorvastatin  (LIPITOR) 40 MG tablet [id number] Yes Take 1 tablet (40 mg total) by mouth daily. Nguyen, Diana, MD  Active   diclofenac  Sodium (VOLTAREN ) 1 % GEL [id number] Yes APPLY 4G TOPICALLY 4 TIMES DAILY Guilloud, Carolyn, MD  Active   escitalopram  (LEXAPRO ) 20 MG tablet [id number] Yes Take 1 tablet (20 mg total) by mouth daily. Norrine Ozell, MD  Active   furosemide  (LASIX ) 20 MG tablet [id number] Yes Take 3 tablets (  60 mg total) by mouth daily. Norrine Sharper, MD  Active   gabapentin  (NEURONTIN ) 600 MG tablet [id number] Yes Take 1 tablet (600 mg total) by mouth at bedtime as needed (pain). Lovie Clarity, MD  Active   methocarbamol  (ROBAXIN ) 750 MG tablet [id number] Yes Take 2 tablets (1,500 mg total) by mouth every 8 (eight) hours as needed for muscle spasms. Guilloud, Carolyn, MD  Active   naproxen  (NAPROSYN ) 500 MG tablet [id number] Yes Take 1 tablet (500 mg total) by mouth 2 (two) times daily with a meal. Forest Coy, MD  Active    potassium chloride  (KLOR-CON ) 10 MEQ tablet [id number] Yes Take 1 tablet (10 mEq total) by mouth daily. Rosan Dayton BROCKS, DO  Active   ramelteon  (ROZEREM ) 8 MG tablet [id number] Yes Take 1 tablet (8 mg total) by mouth at bedtime as needed for sleep. Nguyen, Diana, MD  Active   traZODone  (DESYREL ) 100 MG tablet [id number] Yes Take 0.5-1 tablets (50-100 mg total) by mouth at bedtime as needed for sleep. Guilloud, Carolyn, MD  Active             Recommendation:   PCP Follow-up Continue Current Plan of Care Take medications as prescribed Participate in counseling sessions as scheduled with Renda Pontes LCSW of [facility] Call LCSW Glendia Pear  as needed for SW support  Follow Up Plan:   Telephone follow up appointment date/time:  04/08/24 at 11:00 AM   Glendia Pear  MSW, LCSW [place]/[facility] Based [facility] Licensed Clinical Social Worker Direct Dial:  [phone] Fax:  [phone] Website:  [email or web address]

## 2024-03-21 ENCOUNTER — Ambulatory Visit: Admitting: Licensed Clinical Social Worker

## 2024-03-25 ENCOUNTER — Other Ambulatory Visit: Payer: Self-pay

## 2024-03-25 DIAGNOSIS — M533 Sacrococcygeal disorders, not elsewhere classified: Secondary | ICD-10-CM

## 2024-03-25 MED ORDER — METHOCARBAMOL 750 MG PO TABS: 1500.0000 mg | ORAL_TABLET | Freq: Three times a day (TID) | ORAL | 1 refills | Status: AC | PRN

## 2024-03-26 ENCOUNTER — Telehealth: Payer: Self-pay

## 2024-03-26 NOTE — Telephone Encounter (Signed)
 Message has been forwarded to Renda Pontes to reach out to patient.  Copied from CRM [id number]. Topic: Appointments - Appointment Cancel/Reschedule >> Mar 25, 2024  9:33 AM Marda MATSU wrote: Attn: PONTES RENDA J  Please call Pt. Katherine Haynes back, she would like to reschedule her appointment and discuss how she is doing.

## 2024-03-27 ENCOUNTER — Other Ambulatory Visit: Payer: Self-pay | Admitting: *Deleted

## 2024-03-27 DIAGNOSIS — F5101 Primary insomnia: Secondary | ICD-10-CM

## 2024-03-27 MED ORDER — TRAZODONE HCL 100 MG PO TABS: 50.0000 mg | ORAL_TABLET | Freq: Every evening | ORAL | 1 refills | Status: DC | PRN

## 2024-04-08 ENCOUNTER — Other Ambulatory Visit: Payer: Self-pay | Admitting: Licensed Clinical Social Worker

## 2024-04-17 ENCOUNTER — Ambulatory Visit (INDEPENDENT_AMBULATORY_CARE_PROVIDER_SITE_OTHER): Admitting: Licensed Clinical Social Worker

## 2024-04-17 DIAGNOSIS — F411 Generalized anxiety disorder: Secondary | ICD-10-CM | POA: Diagnosis not present

## 2024-04-17 NOTE — BH Specialist Note (Signed)
 Integrated Behavioral Health Follow Up In-Person Visit  MRN: [id number] Name: Katherine Haynes  Number of Integrated Behavioral Health Clinician visits: Additional Visit  Session Start time: 1430   Session End time: 1530  Total time in minutes: 60    Types of Service: Individual psychotherapy and General Behavioral Integrated Care (BHI)  Interpretor:No. Interpretor Name and Language: N/a  Subjective: Katherine Haynes is a 73 y.o. female accompanied  Patient was referred by PCP for [facility]. Patient reports the following symptoms/concerns: Client is a 73 year old female seen today for individual therapy focused on anxiety. She reports frequent worry, especially in the evenings and when she is alone, with racing thoughts about her health, her family and the future, but denies any suicidal ideation or thoughts of harming herself or others. In session she was neatly dressed, alert and oriented, with a nervous mood and anxious but appropriate affect; her thoughts were clear and organized and her insight and judgment were fair. We discussed how anxiety works in the body and mind and identified triggers such as being alone at night and watching stressful news. Therapist introduced several coping skills, including slow deep breathing, grounding using the five senses, planning at least one small pleasant activity each day, limiting evening news and social media, and reaching out to a support person or using distraction such as reading, music or puzzles when worry increases; client practiced breathing in session and reported feeling a little less tense. For homework, client should keep a brief daily journal, noting one situation that caused anxiety, the thoughts she had, how strong the anxiety was on a zero to ten scale, which coping skill she tried, and ending with one thing she is grateful for each day.Duration of problem: More than two years; Severity of problem: mild  Objective: Mood: NA and  Affect: Appropriate Risk of harm to self or others: No plan to harm self or others  Life Context: Family and Social: Married School/Work: N/A Self-Care: N/A Life Changes: Grandson moved in.  Patient and/or Family's Strengths/Protective Factors: Social connections  Goals Addressed: Patient will:  Reduce symptoms of: anxiety   Increase knowledge and/or ability of: coping skills   Demonstrate ability to: Increase healthy adjustment to current life circumstances  Progress towards Goals: Ongoing  Interventions: Interventions utilized:  Psychoeducation and/or Health Education Standardized Assessments completed: Patient declined screening      Patient and/or Family Response: Patient agreed to ongoing counseling  Patient Centered Plan: Patient is on the following Treatment Plan(s): Monthly Sessions  Clinical Assessment/Diagnosis  Generalized anxiety disorder    Assessment: Patient currently experiencing Anxiety.   Patient may benefit from Ongoing Counseling.  Plan: Follow up with behavioral health clinician on : Within 30 days.  Renda Pontes, MSW, LCSW-A She/Her Behavioral Health Clinician [facility]

## 2024-04-18 ENCOUNTER — Encounter: Payer: Self-pay | Admitting: Emergency Medicine

## 2024-04-18 ENCOUNTER — Encounter: Admitting: Student

## 2024-04-18 ENCOUNTER — Ambulatory Visit: Admission: EM | Admit: 2024-04-18 | Discharge: 2024-04-18 | Disposition: A | Discharge status: Home / Self Care

## 2024-04-18 DIAGNOSIS — A084 Viral intestinal infection, unspecified: Secondary | ICD-10-CM | POA: Diagnosis not present

## 2024-04-18 DIAGNOSIS — R112 Nausea with vomiting, unspecified: Secondary | ICD-10-CM | POA: Insufficient documentation

## 2024-04-18 DIAGNOSIS — J4520 Mild intermittent asthma, uncomplicated: Secondary | ICD-10-CM | POA: Insufficient documentation

## 2024-04-18 DIAGNOSIS — R600 Localized edema: Secondary | ICD-10-CM | POA: Diagnosis not present

## 2024-04-18 DIAGNOSIS — I10 Essential (primary) hypertension: Secondary | ICD-10-CM | POA: Insufficient documentation

## 2024-04-18 LAB — POC COVID19/FLU A&B COMBO
Covid Antigen, POC: NEGATIVE
Influenza A Antigen, POC: NEGATIVE
Influenza B Antigen, POC: NEGATIVE

## 2024-04-18 MED ORDER — ONDANSETRON 4 MG PO TBDP: 4.0000 mg | ORAL_TABLET | Freq: Three times a day (TID) | ORAL | 0 refills | Status: DC | PRN

## 2024-04-18 NOTE — Progress Notes (Deleted)
 CC: ***  HPI: [name] is a 73 y.o. female living with a history stated below and presents today for ***. Please see problem based assessment and plan for additional details.  Medications: HTN: Amlodipine  10 mg HLD: Atorvastatin  40 mg Depression: Lexapro  20 mg Lymphedema Lasix  60 mg and potassium 10 mEq Muscle spasms: Robaxin  1500 mg every 8 hours as needed Radicular pain: Gabapentin  600 mg nightly Insomnia: Ramelteon  8 mg nightly  Past Medical History:  Diagnosis Date   BPPV (benign paroxysmal positional vertigo) 12/08/2021   Healthcare maintenance 04/25/2017   Hyperlipidemia    Hypertension    LBBB (left bundle branch block) 10/26/2020   Beta blocker discontinued June of 2022     Menopause syndrome     Current Outpatient Medications on File Prior to Visit  Medication Sig Dispense Refill   amLODipine  (NORVASC ) 10 MG tablet Take 1 tablet (10 mg total) by mouth daily. 90 tablet 3   atorvastatin  (LIPITOR) 40 MG tablet Take 1 tablet (40 mg total) by mouth daily. 90 tablet 3   diclofenac  Sodium (VOLTAREN ) 1 % GEL APPLY 4G TOPICALLY 4 TIMES DAILY 100 g 2   escitalopram  (LEXAPRO ) 20 MG tablet Take 1 tablet (20 mg total) by mouth daily. 90 tablet 3   furosemide  (LASIX ) 20 MG tablet Take 3 tablets (60 mg total) by mouth daily. 270 tablet 1   gabapentin  (NEURONTIN ) 600 MG tablet Take 1 tablet (600 mg total) by mouth at bedtime as needed (pain). 90 tablet 3   methocarbamol  (ROBAXIN ) 750 MG tablet Take 2 tablets (1,500 mg total) by mouth every 8 (eight) hours as needed for muscle spasms. 180 tablet 1   naproxen  (NAPROSYN ) 500 MG tablet Take 1 tablet (500 mg total) by mouth 2 (two) times daily with a meal. 60 tablet 2   potassium chloride  (KLOR-CON ) 10 MEQ tablet Take 1 tablet (10 mEq total) by mouth daily. 90 tablet 3   ramelteon  (ROZEREM ) 8 MG tablet Take 1 tablet (8 mg total) by mouth at bedtime as needed for sleep. 90 tablet 3   traZODone  (DESYREL ) 100 MG tablet Take 0.5-1  tablets (50-100 mg total) by mouth at bedtime as needed for sleep. 90 tablet 1   No current facility-administered medications on file prior to visit.    Family History  Problem Relation Age of Onset   Heart disease Mother        enlarged heart   Hypertension Mother    Diabetes Mother    Heart disease Maternal Uncle        Bypass surg   Heart attack Maternal Grandmother        also had irregular heart beat requriring a pacemaker.   Hypertension Other        In multiple relatives.    Hypertension Paternal Grandmother    COPD Sister    Sarcoidosis Sister    Heart block Sister        s/p PPM   Hypertension Brother    Hypertension Sister    Arthritis Sister    COPD Sister    Diabetes Sister    Hypertension Sister    Arthritis Sister    Hypertension Brother    Healthy Brother    Healthy Brother    Healthy Sister    Breast cancer Neg Hx     Social History   Socioeconomic History   Marital status: Married    Spouse name: Not on file   Number of children: Not on file  Years of education: HSG   Highest education level: Not on file  Occupational History   Not on file  Tobacco Use   Smoking status: Former   Smokeless tobacco: Never   Tobacco comments:     1 PPD for 2-3 years. Quit ~8018.  Vaping Use   Vaping status: Never Used  Substance and Sexual Activity   Alcohol use: No   Drug use: No   Sexual activity: Yes    Birth control/protection: Surgical  Other Topics Concern   Not on file  Social History Narrative   Lives with husband. Good relationship, feels safe at home. Studying medical administration.       Current Social History 09/27/2018        Patient lives with spouse in an apartment on the second floor. There are 14 steps with handrails up to the entrance the patient uses.       Patient's method of transportation is personal car.      The highest level of education was high school diploma.      The patient currently is employed as a Engineer, site  in patient's homes.      Identified important Relationships are My husband, my kids, my grands and great grands.       Pets : None       Interests / Fun: Watching TV, going to beach and park, cooking out with family.       Current Stressors: finances, teenagers (grandkids)       Religious / Personal Beliefs: Methodist, I was raised to treat people the way you want to be treated.          Social Drivers of Corporate investment banker Strain: Low Risk  (12/20/2023)   Overall Financial Resource Strain (CARDIA)    Difficulty of Paying Living Expenses: Not hard at all  Food Insecurity: No Food Insecurity (01/23/2024)   Hunger Vital Sign    Worried About Running Out of Food in the Last Year: Never true    Ran Out of Food in the Last Year: Never true  Transportation Needs: No Transportation Needs (01/23/2024)   PRAPARE - Administrator, Civil Service (Medical): No    Lack of Transportation (Non-Medical): No  Physical Activity: Insufficiently Active (01/23/2024)   Exercise Vital Sign    Days of Exercise per Week: 2 days    Minutes of Exercise per Session: 10 min  Stress: Stress Concern Present (02/27/2024)   [employer] of Occupational Health - Occupational Stress Questionnaire    Feeling of Stress: To some extent  Social Connections: Moderately Integrated (12/20/2023)   Social Connection and Isolation Panel    Frequency of Communication with Friends and Family: More than three times a week    Frequency of Social Gatherings with Friends and Family: More than three times a week    Attends Religious Services: More than 4 times per year    Active Member of [employer] or Organizations: No    Attends Banker Meetings: Never    Marital Status: Married  Catering manager Violence: Not At Risk (01/23/2024)   Humiliation, Afraid, Rape, and Kick questionnaire    Fear of Current or Ex-Partner: No    Emotionally Abused: No    Physically Abused: No    Sexually Abused: No     Review of Systems: ROS negative except for what is noted on the assessment and plan.  There were no vitals filed for this visit.  Physical  Exam  Physical Exam: Constitutional: well-appearing *** sitting in ***, in no acute distress HENT: normocephalic atraumatic, mucous membranes moist Eyes: conjunctiva non-erythematous Neck: supple Cardiovascular: regular rate and rhythm, no m/r/g Pulmonary/Chest: normal work of breathing on room air, lungs clear to auscultation bilaterally Abdominal: soft, non-tender, non-distended MSK: *** Neurological: alert & oriented x 3, 5/5 strength in bilateral upper and lower extremities, normal gait Skin: warm and dry Psych: ***  Assessment & Plan:   Assessment & Plan   No orders of the defined types were placed in this encounter.  PMH: HTN, cervical myelopathy with radiculopathy, right hip bursitis, SI joint dysfunction, menopause, depression, lymphedema, insomnia, HLD  Insomnia*** LOV 01/2024, stopped trazodone  due to side effect started ramelteon  8 mg nightly with referral to [facility] Trial melatonin point Insomnia asymptomatic recent stressors include with PHQ-9 of 8, remains on Lexapro  20 mg  Lymphedema*** Lasix  60 mg daily with potassium 10 mEq.  BMP last done 01/2024 with normal renal function and electrolytes (K4.0). ?  Compression stockings? Of note, ABIs done in 02/2023 with normal range for both legs (right 1.17) (left 1.18).  Depression Psychosocial stressors On Lexapro  20 mg.  Follows with [employer].   Care: Flu   No follow-ups on file.   Patient {GC/GE:[id number]::discussed with,seen with} Dr. {[id number]::Tpoopjfd,Z. Hoffman,Winfrey,Narendra,Chun,Chambliss,Lau,Machen}  Ozell Nearing, D.O. [facility] Internal [facility], PGY-3 Phone: [phone] Date 04/18/2024 Time 7:47 AM

## 2024-04-18 NOTE — ED Provider Notes (Signed)
 [facility]  Note:  This document was prepared using Dragon voice recognition software and may include unintentional dictation errors.  MRN: [id number] DOB: 1950/11/03  Subjective:   Katherine Haynes is a 73 y.o. female presenting for nasal congestion, cough, headache, body aches, fever/chills, nausea/vomiting x 4 days.  Patient reports that her husband was sick prior to the onset of shortness her symptoms.  Patient also reports that she recently was traveling and when she was on her way home is when she began having symptoms.  Patient states that she did not perform any home COVID testing.  Patient has been taking Coricidin Tylenol  PM with mild relief.  Patient reports that nausea usually only comes on if she eats.  Patient denies any oral intake other than fluids over the last 1 to 2 days.  Patient has minimal to no appetite.  Patient is able to drink fluids with no concern for nausea or vomiting.  No current facility-administered medications for this encounter.  Current Outpatient Medications:    amLODipine  (NORVASC ) 10 MG tablet, Take 1 tablet (10 mg total) by mouth daily., Disp: 90 tablet, Rfl: 3   aspirin  81 MG chewable tablet, Chew 81 mg by mouth daily., Disp: , Rfl:    atorvastatin  (LIPITOR) 40 MG tablet, Take 1 tablet (40 mg total) by mouth daily., Disp: 90 tablet, Rfl: 3   escitalopram  (LEXAPRO ) 20 MG tablet, Take 1 tablet (20 mg total) by mouth daily., Disp: 90 tablet, Rfl: 3   furosemide  (LASIX ) 20 MG tablet, Take 3 tablets (60 mg total) by mouth daily., Disp: 270 tablet, Rfl: 1   gabapentin  (NEURONTIN ) 600 MG tablet, Take 1 tablet (600 mg total) by mouth at bedtime as needed (pain)., Disp: 90 tablet, Rfl: 3   hydrOXYzine  (ATARAX ) 50 MG tablet, Take 50 mg by mouth as needed., Disp: , Rfl:    metoprolol  tartrate (LOPRESSOR ) 25 MG tablet, Take 25 mg by mouth 2 (two) times daily., Disp: , Rfl:    ondansetron  (ZOFRAN -ODT) 4 MG disintegrating tablet, Take 1 tablet  (4 mg total) by mouth every 8 (eight) hours as needed for nausea or vomiting., Disp: 20 tablet, Rfl: 0   potassium chloride  (KLOR-CON ) 10 MEQ tablet, Take 1 tablet (10 mEq total) by mouth daily., Disp: 90 tablet, Rfl: 3   ramelteon  (ROZEREM ) 8 MG tablet, Take 1 tablet (8 mg total) by mouth at bedtime as needed for sleep., Disp: 90 tablet, Rfl: 3   albuterol  (VENTOLIN  HFA) 108 (90 Base) MCG/ACT inhaler, Inhale 2 puffs into the lungs every 4 (four) hours as needed., Disp: , Rfl:    amLODipine  (NORVASC ) 10 MG tablet, Take 10 mg by mouth daily., Disp: , Rfl:    diclofenac  Sodium (VOLTAREN ) 1 % GEL, APPLY 4G TOPICALLY 4 TIMES DAILY, Disp: 100 g, Rfl: 2   fluticasone-salmeterol (ADVAIR) 100-50 MCG/ACT AEPB, 1 puff. (Patient not taking: Reported on 04/18/2024), Disp: , Rfl:    ibuprofen  (ADVIL ) 600 MG tablet, Take 600 mg by mouth every 8 (eight) hours as needed., Disp: , Rfl:    methocarbamol  (ROBAXIN ) 750 MG tablet, Take 2 tablets (1,500 mg total) by mouth every 8 (eight) hours as needed for muscle spasms. (Patient not taking: Reported on 04/18/2024), Disp: 180 tablet, Rfl: 1   naproxen  (NAPROSYN ) 500 MG tablet, Take 1 tablet (500 mg total) by mouth 2 (two) times daily with a meal., Disp: 60 tablet, Rfl: 2   pravastatin (PRAVACHOL) 40 MG tablet, Take 40 mg by mouth daily. (Patient  not taking: Reported on 04/18/2024), Disp: , Rfl:    traZODone  (DESYREL ) 100 MG tablet, Take 0.5-1 tablets (50-100 mg total) by mouth at bedtime as needed for sleep., Disp: 90 tablet, Rfl: 1   Allergies  Allergen Reactions   Contrast Media [Iodinated Contrast Media] Itching   Penicillins Hives    Other Reaction(s): knots   Sulfonamide Derivatives Itching    Past Medical History:  Diagnosis Date   BPPV (benign paroxysmal positional vertigo) 12/08/2021   Healthcare maintenance 04/25/2017   Hyperlipidemia    Hypertension    LBBB (left bundle branch block) 10/26/2020   Beta blocker discontinued June of 2022     Menopause  syndrome      Past Surgical History:  Procedure Laterality Date   CHOLECYSTECTOMY N/A 04/05/2017   Procedure: LAPAROSCOPIC CHOLECYSTECTOMY;  Surgeon: Stevie Herlene Righter, MD;  Location: [facility];  Service: General;  Laterality: N/A;   MOUTH SURGERY     TONSILLECTOMY     TRANSTHORACIC ECHOCARDIOGRAM  04/19/2019    EF 66 5%.  Mild LVH.  GR 1 DD.  Normal atrial sizes.  Mild aortic valve sclerosis but no stenosis.  Normal IVC.  Otherwise normal valves.   TUBAL LIGATION      Family History  Problem Relation Age of Onset   Heart disease Mother        enlarged heart   Hypertension Mother    Diabetes Mother    Heart disease Maternal Uncle        Bypass surg   Heart attack Maternal Grandmother        also had irregular heart beat requriring a pacemaker.   Hypertension Other        In multiple relatives.    Hypertension Paternal Grandmother    COPD Sister    Sarcoidosis Sister    Heart block Sister        s/p PPM   Hypertension Brother    Hypertension Sister    Arthritis Sister    COPD Sister    Diabetes Sister    Hypertension Sister    Arthritis Sister    Hypertension Brother    Healthy Brother    Healthy Brother    Healthy Sister    Breast cancer Neg Hx     Social History   Tobacco Use   Smoking status: Former   Smokeless tobacco: Never   Tobacco comments:     1 PPD for 2-3 years. Quit ~8018.  Vaping Use   Vaping status: Never Used  Substance Use Topics   Alcohol use: No   Drug use: No    ROS Refer to HPI for ROS details.  Objective:   Vitals: BP 113/74 (BP Location: Left Arm)   Pulse 70   Temp 98.1 F (36.7 C) (Oral)   Resp 18   LMP 10/07/1990   SpO2 97%   Physical Exam Vitals and nursing note reviewed.  Constitutional:      General: She is not in acute distress.    Appearance: Normal appearance. She is well-developed. She is not ill-appearing or toxic-appearing.  HENT:     Head: Normocephalic and atraumatic.     Nose: Nose normal. No  congestion.     Mouth/Throat:     Mouth: Mucous membranes are moist.     Pharynx: Oropharynx is clear.  Cardiovascular:     Rate and Rhythm: Normal rate.  Pulmonary:     Effort: Pulmonary effort is normal. No respiratory distress.  Abdominal:  Tenderness: There is no abdominal tenderness. There is no right CVA tenderness or left CVA tenderness.  Skin:    General: Skin is warm and dry.  Neurological:     General: No focal deficit present.     Mental Status: She is alert and oriented to person, place, and time.  Psychiatric:        Mood and Affect: Mood normal.        Behavior: Behavior normal.     Procedures  Results for orders placed or performed during the hospital encounter of 04/18/24 (from the past 24 hours)  POC Covid19/Flu A&B Antigen     Status: Normal   Collection Time: 04/18/24  9:58 AM  Result Value Ref Range   Influenza A Antigen, POC Negative Negative   Influenza B Antigen, POC Negative Negative   Covid Antigen, POC Negative Negative    No results found.   Assessment and Plan :     Discharge Instructions       1. Viral gastroenteritis (Primary) - POC Covid19/Flu A&B Antigen complete in UC shows no COVID, no flu. - ondansetron  (ZOFRAN -ODT) 4 MG disintegrating tablet; Take 1 tablet (4 mg total) by mouth every 8 (eight) hours as needed for nausea or vomiting.  Dispense: 20 tablet; Refill: 0  2. Peripheral edema - Use compression stockings regularly to compress lower extremity inflammation and decrease fluid accumulation. - Keep legs elevated when at rest to decrease inflammation. - Minimize long periods of standing or walking as this may increase fluid accumulation.      Niclas Markell B Abdullahi Vallone   Pattrick Bady, Helper B, [place] 04/18/24 1101

## 2024-04-18 NOTE — ED Triage Notes (Signed)
 Pt reports nasal congestion, productive cough, headache, body aches, fevers, and chills x4 days. Unsure of max temp. Husband was sick last week, no at home testing completed. Pt has taken coricidin and tylenol  PM with little relief.

## 2024-04-18 NOTE — Discharge Instructions (Addendum)
  1. Viral gastroenteritis (Primary) - POC Covid19/Flu A&B Antigen complete in UC shows no COVID, no flu. - ondansetron  (ZOFRAN -ODT) 4 MG disintegrating tablet; Take 1 tablet (4 mg total) by mouth every 8 (eight) hours as needed for nausea or vomiting.  Dispense: 20 tablet; Refill: 0  2. Peripheral edema - Use compression stockings regularly to compress lower extremity inflammation and decrease fluid accumulation. - Keep legs elevated when at rest to decrease inflammation. - Minimize long periods of standing or walking as this may increase fluid accumulation.

## 2024-04-29 ENCOUNTER — Telehealth: Payer: Self-pay

## 2024-04-29 ENCOUNTER — Ambulatory Visit: Payer: Self-pay

## 2024-04-29 VITALS — BP 135/73 | HR 70 | Temp 97.7°F | Ht 66.0 in | Wt 150.6 lb

## 2024-04-29 DIAGNOSIS — R112 Nausea with vomiting, unspecified: Secondary | ICD-10-CM | POA: Diagnosis not present

## 2024-04-29 DIAGNOSIS — F5101 Primary insomnia: Secondary | ICD-10-CM

## 2024-04-29 DIAGNOSIS — F321 Major depressive disorder, single episode, moderate: Secondary | ICD-10-CM | POA: Diagnosis not present

## 2024-04-29 DIAGNOSIS — M542 Cervicalgia: Secondary | ICD-10-CM

## 2024-04-29 DIAGNOSIS — E782 Mixed hyperlipidemia: Secondary | ICD-10-CM

## 2024-04-29 DIAGNOSIS — Z23 Encounter for immunization: Secondary | ICD-10-CM | POA: Diagnosis not present

## 2024-04-29 DIAGNOSIS — I10 Essential (primary) hypertension: Secondary | ICD-10-CM | POA: Diagnosis not present

## 2024-04-29 DIAGNOSIS — Z79899 Other long term (current) drug therapy: Secondary | ICD-10-CM

## 2024-04-29 DIAGNOSIS — Z8249 Family history of ischemic heart disease and other diseases of the circulatory system: Secondary | ICD-10-CM

## 2024-04-29 DIAGNOSIS — Z1231 Encounter for screening mammogram for malignant neoplasm of breast: Secondary | ICD-10-CM

## 2024-04-29 MED ORDER — MIRTAZAPINE 7.5 MG PO TABS: 7.5000 mg | ORAL_TABLET | Freq: Every day | ORAL | 1 refills | Status: DC

## 2024-04-29 MED ORDER — DOXEPIN HCL 3 MG PO TABS: 3.0000 mg | ORAL_TABLET | Freq: Every day | ORAL | 1 refills | Status: DC

## 2024-04-29 NOTE — Telephone Encounter (Addendum)
 Message has been given to the patient. Patient understands and will give us  a call back if she has any questions.

## 2024-04-29 NOTE — Assessment & Plan Note (Signed)
 Patient's last mammogram was normal in 2024.  She does have dense breast and family history of breast cancer in her sister who was diagnosed at the age of 77.  Patient does not have any noticeable lumps that she has seen since last mammogram.  Is just concerned with her sisters history of breast cancer.  Will order another mammogram today and see if this is covered with risk factor of dense breast and family hx. If not will proceed with regularly scheduled mammogram next year

## 2024-04-29 NOTE — Addendum Note (Signed)
 Addended by: KEM NA on: 04/29/2024 02:36 PM   Modules accepted: Orders

## 2024-04-29 NOTE — Progress Notes (Addendum)
 Established Patient Office Visit  Subjective   Patient ID: Katherine Haynes, female    DOB: 1950-11-16  Age: 73 y.o. MRN: [id number]  Chief Complaint  Patient presents with   Follow-up   Referral    Mammogram     HPI Katherine Haynes is a 73 year old female with PMH of HTN, Hyperlipidemia, lymphedema that presents today for follow up. See problem based plan and assessment for more details.    ROS See problem based plan and assessment for more details    Objective:     BP 135/73 (BP Location: Left Arm, Patient Position: Sitting, Cuff Size: Normal)   Pulse 70   Temp 97.7 F (36.5 C) (Oral)   Ht 5' 6 (1.676 m)   Wt 150 lb 9.6 oz (68.3 kg)   LMP 10/07/1990   SpO2 96%   BMI 24.31 kg/m  BP Readings from Last 3 Encounters:  04/29/24 135/73  04/18/24 113/74  01/16/24 128/77   Wt Readings from Last 3 Encounters:  04/29/24 150 lb 9.6 oz (68.3 kg)  01/16/24 151 lb 6.4 oz (68.7 kg)  12/20/23 165 lb (74.8 kg)      Physical Exam Constitution: Alert, in no acute distress Heart: Regular rate and rhythm, no murmurs heard Lungs: Respiratory effort normal and lungs are clear to auscultation Lower extremities: Patient is wearing bilateral compression hose.  1+ pitting edema from below knee to ankles.  No results found for any visits on 04/29/24.    The 10-year ASCVD risk score (Arnett DK, et al., 2019) is: 12.8%    Assessment & Plan:   Problem List Items Addressed This Visit     Hyperlipidemia (Chronic)   Continue atorvastatin  40 mg       Essential hypertension (Chronic)   Continue amlodipine  10 mg       Insomnia   Patient states that she has trouble falling asleep and usually falls asleep by 1 or 2 AM.  She states that she has been using sleep hygiene methods faithfully.  Patient states that she has trouble falling asleep due to to racing thoughts.  Patient has tried trazodone  and states that it made her groggy and would not like to try this again.  Also states  that she has been taking gabapentin  for vasomotor symptoms and sleep but this is not helping and does not have vasomotor symptoms anymore  Plan: Told patient to stop taking trazodone  and gabapentin  for sleep. Will continue ramelteon  8 mg as patient states she thinks it has had some benefit Patient states that she has not tried melatonin before although has been noted on her chart.  Suggest that the patient take 3 to 6 mg of melatonin 30 minutes to an hour before sleep.  If this does not help after week ,then can start taking mirtazapine 7.5 mg. Doxepin not covered by insurance       Major depression (Chronic)   Patient is on Lexapro  20 mg which is the max dose.  Hesitant to switch to another agent as we do not want activating effects that would affect her insomnia.  States that with the grief from her son's death she still having some trouble sleeping.  Will try some other medications for sleep maintenance, see problem below.  Plan: Continue Lexapro  20 mg Continue seeing Bianca      Relevant Medications   mirtazapine (REMERON) 7.5 MG tablet   Encounter for screening mammogram for breast cancer   Patient's last mammogram was normal in  2024.  She does have dense breast and family history of breast cancer in her sister who was diagnosed at the age of 78.  Patient does not have any noticeable lumps that she has seen since last mammogram.  Is just concerned with her sisters history of breast cancer.  Will order another mammogram today and see if this is covered with risk factor of dense breast and family hx. If not will proceed with regularly scheduled mammogram next year       Relevant Orders   MM 3D SCREENING MAMMOGRAM BILATERAL BREAST   Nausea and vomiting    Patient was seen on 10/2 at urgent care for nasal congestion, cough, headache, body aches, fever/chills, nausea/vomiting x 4 days. She was given zofran  for nausea.   States that she still does not quite have the appetite back yet and  certain foods still disagree with her. Denies any melena or hematochezia, nauseous, no diarrhea and constipation today.   Plan: In the absence of any red flag symptoms, will continue to monitor for resolution        Other Visit Diagnoses       Encounter for immunization    -  Primary   Relevant Orders   Flu vaccine HIGH DOSE PF(Fluzone Trivalent) (Completed)     Neck pain              Return in about 3 months (around 07/30/2024).    Roddy Bellamy D'Mello, DO Patient seen with Dr.Machen

## 2024-04-29 NOTE — Patient Instructions (Addendum)
 Today we discussed the following medical conditions and plan:   For your sleep: We will try Melatonin 3-6 mg  30 minutes to an hour before sleep. Try this for a week and if this doesn't work after a week you can try doxepin 3 mg   I will send an order for your screening mammogram  Stop taking gabapentin  and ibuprofen   Continue seeing Renda and take your lexapro    We look forward to seeing you next time. Please call our clinic at [phone] if you have any questions or concerns. The best time to call is Monday-Friday from 9am-4pm, but there is someone available 24/7. If you need medication refills, please notify your pharmacy one week in advance and they will send us  a request.   Thank you for trusting me with your care. Wishing you the best!   Aven Cegielski D'Mello, DO  [facility]   We will see you back in 3 months

## 2024-04-29 NOTE — Telephone Encounter (Signed)
 Patient called E2C2 she stated she seen Dr.D'Mello this morning and she was told to call her insurance to see if they will cover Doxepin. Patient stated that her insurance will not cover the medication. Patient is requesting a alternative rx to be sent in.

## 2024-04-29 NOTE — Assessment & Plan Note (Signed)
Continue amlodipine 10 mg  

## 2024-04-29 NOTE — Assessment & Plan Note (Signed)
 Patient is on Lexapro  20 mg which is the max dose.  Hesitant to switch to another agent as we do not want activating effects that would affect her insomnia.  States that with the grief from her son's death she still having some trouble sleeping.  Will try some other medications for sleep maintenance, see problem below.  Plan: Continue Lexapro  20 mg Continue seeing [address]

## 2024-04-29 NOTE — Assessment & Plan Note (Signed)
 Continue atorvastatin 40mg 

## 2024-04-29 NOTE — Assessment & Plan Note (Signed)
 Patient was seen on 10/2 at urgent care for nasal congestion, cough, headache, body aches, fever/chills, nausea/vomiting x 4 days. She was given zofran  for nausea.   States that she still does not quite have the appetite back yet and certain foods still disagree with her. Denies any melena or hematochezia, nauseous, no diarrhea and constipation today.   Plan: In the absence of any red flag symptoms, will continue to monitor for resolution

## 2024-04-29 NOTE — Assessment & Plan Note (Addendum)
 Patient states that she has trouble falling asleep and usually falls asleep by 1 or 2 AM.  She states that she has been using sleep hygiene methods faithfully.  Patient states that she has trouble falling asleep due to to racing thoughts.  Patient has tried trazodone  and states that it made her groggy and would not like to try this again.  Also states that she has been taking gabapentin  for vasomotor symptoms and sleep but this is not helping and does not have vasomotor symptoms anymore  Plan: Told patient to stop taking trazodone  and gabapentin  for sleep. Will continue ramelteon  8 mg as patient states she thinks it has had some benefit Patient states that she has not tried melatonin before although has been noted on her chart.  Suggest that the patient take 3 to 6 mg of melatonin 30 minutes to an hour before sleep.  If this does not help after week ,then can start taking mirtazapine 7.5 mg. Doxepin not covered by insurance

## 2024-04-30 ENCOUNTER — Other Ambulatory Visit (HOSPITAL_COMMUNITY): Payer: Self-pay

## 2024-04-30 ENCOUNTER — Ambulatory Visit (INDEPENDENT_AMBULATORY_CARE_PROVIDER_SITE_OTHER): Admitting: Licensed Clinical Social Worker

## 2024-04-30 DIAGNOSIS — F4320 Adjustment disorder, unspecified: Secondary | ICD-10-CM

## 2024-05-01 ENCOUNTER — Telehealth: Payer: Self-pay | Admitting: Internal Medicine

## 2024-05-01 NOTE — Progress Notes (Signed)
 Internal Medicine Clinic Attending  I was physically present during the key portions of the resident provided service and participated in the medical decision making of patient's management care. I reviewed pertinent patient test results.  The assessment, diagnosis, and plan were formulated together and I agree with the documentation in the resident's note.  Lovie Clarity, MD   See 10/15 phone note for updates

## 2024-05-01 NOTE — Telephone Encounter (Signed)
 Called patient to follow up on her sleep.  She picked up the melatonin and tried it last night.  Too soon to tell if sleep was better.  I instructed her not to take the melatonin & ramelteon  together - I don't think this would be dangerous, but since they work on the same pathway, it is unnecessary to take both - she expressed understanding and will take just one.   If these medicines do not work, since insurance does not cover doxepin without failing other agents, Dr Kem has called in mirtazapine as a second-line option.   Ms Donelan will pick that up next week if needed.

## 2024-05-17 ENCOUNTER — Ambulatory Visit

## 2024-05-20 ENCOUNTER — Other Ambulatory Visit: Payer: Self-pay | Admitting: Licensed Clinical Social Worker

## 2024-05-20 ENCOUNTER — Ambulatory Visit
Admission: RE | Admit: 2024-05-20 | Discharge: 2024-05-20 | Disposition: A | Discharge status: Home / Self Care | Source: Ambulatory Visit | Attending: Internal Medicine | Admitting: Internal Medicine

## 2024-05-20 DIAGNOSIS — Z1231 Encounter for screening mammogram for malignant neoplasm of breast: Secondary | ICD-10-CM

## 2024-05-20 NOTE — Patient Outreach (Signed)
 Complex Care Management   Visit Note  05/20/2024  Name:  Katherine Haynes MRN: [id number] DOB: [birth year]  Situation: Referral received for Complex Care Management related to grief issues faced  I obtained verbal consent from Patient.  Visit completed with Patient  on the phone  Background:   Past Medical History:  Diagnosis Date   BPPV (benign paroxysmal positional vertigo) 12/08/2021   Healthcare maintenance 04/25/2017   Hyperlipidemia    Hypertension    LBBB (left bundle branch block) 10/26/2020   Beta blocker discontinued June of 2022     Menopause syndrome     Assessment: Patient Reported Symptoms:  Cognitive Cognitive Status: Alert and oriented to person, place, and time Cognitive/Intellectual Conditions Management [RPT]: None reported or documented in medical history or problem list   Health Maintenance Behaviors: Exercise, Social activities Health Facilitated by: Stress management  Neurological Neurological Review of Symptoms: Weakness, Dizziness, Vision changes (wears glasses) Neurological Management Strategies: Coping strategies  HEENT HEENT Symptoms Reported: No symptoms reported HEENT Management Strategies: Coping strategies    Cardiovascular Cardiovascular Symptoms Reported: Dizziness, Swelling in legs or feet Cardiovascular Management Strategies: Adequate rest  Respiratory Respiratory Symptoms Reported: No symptoms reported Respiratory Management Strategies: Coping strategies  Endocrine Endocrine Symptoms Reported: Headaches, Weakness or fatigue Is patient diabetic?: No    Gastrointestinal Gastrointestinal Symptoms Reported: No symptoms reported Gastrointestinal Management Strategies: Adequate rest    Genitourinary Genitourinary Symptoms Reported: Frequency Genitourinary Management Strategies: Coping strategies  Integumentary Integumentary Symptoms Reported: No symptoms reported Skin Management Strategies: Coping strategies  Musculoskeletal Musculoskelatal  Symptoms Reviewed: Weakness Musculoskeletal Management Strategies: Coping strategies      Psychosocial Psychosocial Symptoms Reported: Sadness - if selected complete PHQ 2-9, Depression - if selected complete PHQ 2-9 Additional Psychological Details: grief issues Behavioral Management Strategies: Coping strategies Major Change/Loss/Stressor/Fears (CP): Medical condition, self Techniques to Cope with Loss/Stress/Change: Counseling Quality of Family Relationships: supportive Do you feel physically threatened by others?: No    05/20/2024    PHQ2-9 Depression Screening   Little interest or pleasure in doing things Several days  Feeling down, depressed, or hopeless Several days  PHQ-2 - Total Score 2  Trouble falling or staying asleep, or sleeping too much Several days  Feeling tired or having little energy Several days  Poor appetite or overeating  Several days  Feeling bad about yourself - or that you are a failure or have let yourself or your family down Several days  Trouble concentrating on things, such as reading the newspaper or watching television Several days  Moving or speaking so slowly that other people could have noticed.  Or the opposite - being so fidgety or restless that you have been moving around a lot more than usual Several days  Thoughts that you would be better off dead, or hurting yourself in some way Not at all  PHQ2-9 Total Score 8  If you checked off any problems, how difficult have these problems made it for you to do your work, take care of things at home, or get along with other people Somewhat difficult  Depression Interventions/Treatment Currently on Treatment    Vitals:  BP in normal range per client information   Medications Reviewed Today     Reviewed by Frances Ozell GORMAN KEN (Social Worker) on 05/20/24 at 1350  Med List Status: <None>   Medication Order Taking? Sig Documenting Provider Last Dose Status Informant  albuterol  (VENTOLIN  HFA) 108 (90  Base) MCG/ACT inhaler [id number] Yes Inhale 2 puffs into the lungs  every 4 (four) hours as needed. [provider]  Active   amLODipine  (NORVASC ) 10 MG tablet [id number] Yes Take 1 tablet (10 mg total) by mouth daily. Nguyen, Diana, MD  Active   atorvastatin  (LIPITOR) 40 MG tablet [id number] Yes Take 1 tablet (40 mg total) by mouth daily. Nguyen, Diana, MD  Active   diclofenac  Sodium (VOLTAREN ) 1 % GEL [id number] Yes APPLY 4G TOPICALLY 4 TIMES DAILY Guilloud, Carolyn, MD  Active   escitalopram  (LEXAPRO ) 20 MG tablet [id number] Yes Take 1 tablet (20 mg total) by mouth daily. Norrine Sharper, MD  Active   furosemide  (LASIX ) 20 MG tablet [id number] Yes Take 3 tablets (60 mg total) by mouth daily. Norrine Sharper, MD  Active   hydrOXYzine  (ATARAX ) 50 MG tablet [id number] Yes Take 50 mg by mouth as needed. [provider]  Active   methocarbamol  (ROBAXIN ) 750 MG tablet [id number] Not taking  Take 2 tablets (1,500 mg total) by mouth every 8 (eight) hours as needed for muscle spasms.  Patient not taking: Reported on 05/20/2024   Nguyen, Diana, MD  Active   mirtazapine (REMERON) 7.5 MG tablet [id number] Yes Take 1 tablet (7.5 mg total) by mouth at bedtime. D'Mello, Rosalyn, DO  Active   naproxen  (NAPROSYN ) 500 MG tablet [id number] Not taking  Take 1 tablet (500 mg total) by mouth 2 (two) times daily with a meal.  Patient not taking: Reported on 05/20/2024   Guilloud, Carolyn, MD  Active   potassium chloride  (KLOR-CON ) 10 MEQ tablet [id number] Yes Take 1 tablet (10 mEq total) by mouth daily. Rosan Dayton BROCKS, DO  Active   ramelteon  (ROZEREM ) 8 MG tablet [id number] Yes Take 1 tablet (8 mg total) by mouth at bedtime as needed for sleep. Nguyen, Diana, MD  Active             Recommendation:   PCP Follow-up Continue Current Plan of Care Call LCSW as needed for SW support Participate in scheduled  counseling appointments with Renda Pontes LCSW at [facility]  Allow time for daily routine  and activities. Allow time for social activities of choice  Follow Up Plan:   Telephone follow up appointment date/time:  07/01/24 at 1:30 PM    Glendia Pear  MSW, LCSW [place]/Value Based Care [facility] Licensed Clinical Social Worker Direct Dial:  [phone] Fax:  [phone] Website:  [email or web address]

## 2024-05-20 NOTE — Patient Instructions (Signed)
 Visit Information  Thank you for taking time to visit with me today. Please don't hesitate to contact me if I can be of assistance to you before our next scheduled appointment.  Our next appointment is by telephone on 07/01/24 at 1:30 PM   Please call the care guide team at [phone] if you need to cancel or reschedule your appointment.   Following is a copy of your care plan:   Goals Addressed             This Visit's Progress    VBCI Social Work Care Plan       Problems:   Grief issues faced             Depression issues              Insomnia (varies)              Vertigo             HTN              CSW Clinical Goal(s):    Over the next 30  days the Patient will attend all scheduled medical appointments as evidenced by patient report and care team review of appointment completion in electronic MEDICAL RECORD NUMBER.           Over next 30 days, the Patient will use coping skills learned  to help her manage grief issues experienced AEB client report of reduction in grief symptoms faced. Client sees Renda Pontes LCSW with [facility].   Interventions:  Discussed grief issues of client related to death of her son               Client has been receiving counseling support with Renda Pontes, LCSW with [facility]. Client feels that these sessions with counselor have been helpful to her.             Discussed pain issues of client             Discussed medication procurement of client             Discussed client transport needs. She drives her car as needed             Discussed family support of client. Client said her family is very supportive. She has support of her spouse and of her two sisters. She said her spouse is doing well with his health and is supportive to her. She also said she has some support from her grandson.             Discussed program support with RN, LCSW, Pharmacist             Congratulated client on her participating in  counseling sessions with Renda Pontes LCSW of [facility]. Client said she feels that counseling sessions with Renda Pontes have been helpful to her.             Discussed stress reduction activities of client. She likes talking with her 2 sisters via phone.  She likes social activities with family members            Discussed support of LCSW, Glendia Pear  Encouraged Emelin to call LCSW Glendia Pear  as needed for SW support at [phone]               Patient Goals/Self-Care Activities:  Take medications as prescribed  Attend medical appointments as scheduled              Allow time for self care (adequate rest, eating meals on schedule, allowing time for socializing with friends)              Call LCSW Glendia Pear as needed to discuss SW needs of client              Talk with PCP about her medical needs              Participate in scheduled counseling sessions with Renda Pontes, LCSW with [facility].    Plan:   Telephone follow up appointment with care management team member scheduled for:  07/01/2024 at 1:30 PM         Please go to [facility] Urgent [facility] [address] [place] [phone]) if you are experiencing a Mental Health or Behavioral Health Crisis or need someone to talk to.  Patient verbalized understanding of Care plan and visit instructions communicated this visit   Glendia Pear  MSW, LCSW [place]/Value Based Care [facility] Licensed Clinical Social Worker Direct Dial:  [phone] Fax:  [phone] Website:  [email or web address]

## 2024-05-21 ENCOUNTER — Ambulatory Visit: Admitting: Licensed Clinical Social Worker

## 2024-05-22 NOTE — Telephone Encounter (Signed)
 Name: Lloyd, Cullinan MRN: [id number]  Date: 05/29/2024 Status: Sch  Time: 2:30 PM Length: 60  Visit Type: INTEGRATED BH FOLLOW UP [1646] Copay: $25.00  Provider: Bennett Renda PARAS       Copied from CRM [id number]. Topic: Appointments - Scheduling Inquiry for Clinic >> May 21, 2024  8:48 AM Diannia H wrote: Reason for CRM: Patient is wanting to change with Renda PARAS Bennett, her 10:00 appointment from a in person appointment to a over the phone or video visit. Could you assist? Patients callback number is [phone].

## 2024-05-23 ENCOUNTER — Ambulatory Visit: Payer: Self-pay

## 2024-05-29 ENCOUNTER — Ambulatory Visit: Admitting: Licensed Clinical Social Worker

## 2024-05-29 DIAGNOSIS — F411 Generalized anxiety disorder: Secondary | ICD-10-CM | POA: Diagnosis not present

## 2024-05-30 NOTE — BH Specialist Note (Signed)
 Integrated [facility] Follow Up In-Person Visit  MRN: [id number] Name: Katherine Haynes  Number of Integrated Behavioral Health Clinician visits: Additional Visit  Session Start time: 1000   Session End time: 1100  Total time in minutes: 60    Types of Service: Individual psychotherapy and General Behavioral Integrated Care (BHI)  Interpretor:No. Interpretor Name and Language: N/A  Subjective: Katherine Haynes is a 73 y.o. female  Patient was referred by PCP for Counseling. Patient reports the following symptoms/concerns: During today's follow up session, this 73 year old female described her anxiety as "more manageable," noting she still worries but is not "stuck in it as long." She reports using deep breathing most nights, journaling several times a week and catching herself when her thoughts go straight to "worst case scenario." She shared that having her grandson living with her and her husband continues to be a bright spot and helps her stay present. She denies suicidal or homicidal ideation. Plan is for her to keep practicing diaphragmatic breathing, 5-4-3-2-1 grounding, brief nightly journaling about triggers and more balanced thoughts and to continue limiting late-night news; we will monitor symptoms and adjust as needed at the next session.   Objective: Mood: NA and Affect: Appropriate Risk of harm to self or others: No plan to harm self or others    Patient and/or Family's Strengths/Protective Factors: Social connections  Goals Addressed: Patient will:  Reduce symptoms of: anxiety   Progress towards Goals: Ongoing  Interventions: Interventions utilized:  CBT Cognitive Behavioral Therapy and Psychoeducation and/or Health Education Standardized Assessments completed: Patient declined screening      Patient and/or Family Response: Patient reports benefiting from counseling  Patient Centered Plan: Patient is on the following Treatment Plan(s): Monthly  sessions  Clinical Assessment/Diagnosis  Generalized anxiety disorder    Assessment: Patient currently experiencing Anxiety.   Patient may benefit from Ongoing Services.  Plan: Follow up with behavioral health clinician on : Patient will contact office to schedule.  Renda Pontes, MSW, LCSW-A She/Her Behavioral Health Clinician [facility]  Internal Medicine Center Direct [phone]  Fax [phone] Main Office Phone: [phone] [address] [place] [place], [place] [address] Website: [facility]  [place], [place]  [place]

## 2024-06-04 NOTE — Patient Instructions (Signed)
 SABRA

## 2024-06-04 NOTE — BH Specialist Note (Signed)
 Integrated Behavioral Health Follow Up In-Person Visit  MRN: [id number] Name: Katherine Haynes  Number of Integrated Behavioral Health Clinician visits: Additional Visit  Session Start time: 1000   Session End time: 1100  Total time in minutes: 60    Types of Service: Individual psychotherapy and General Behavioral Integrated Care (BHI)  Interpretor:No. Interpretor Name and Language: N/A  Subjective: Katherine Haynes is a 73 y.o. female  Patient was referred by PCP for Counseling.  Patient reports the following symptoms/concerns: Client is a 73 year old female seen for follow up individual therapy for anxiety. Session focused on reinforcing coping skills and planning continued practice. Client will continue working on slow diaphragmatic breathing (4-4-6 pattern) when anxiety increases, daily grounding using the 5-4-3-2-1 senses exercise, and keeping a brief nightly journal to track triggers, thoughts, and one gratitude. She will also continue limiting late-night news and social media, scheduling one small pleasant activity each day, using a set "worry time" earlier in the day to write down concerns and possible next steps, and reaching out to supportive family or using distraction (reading, music, puzzles) when worry escalates. Client was agreeable to continuing these strategies and identified them as helpful tools to manage her anxiety.    Objective: Mood: NA and Affect: Appropriate Risk of harm to self or others: No plan to harm self or others   Patient and/or Family's Strengths/Protective Factors: Social connections  Goals Addressed: Patient will:  Reduce symptoms of: anxiety    Progress towards Goals: Ongoing  Interventions: Interventions utilized:  Psychoeducation and/or Health Education Standardized Assessments completed: Patient declined screening      Patient and/or Family Response: Patient agreed to services  Patient Centered Plan: Patient is on the  following Treatment Plan(s): Monthly sessions  Clinical Assessment/Diagnosis  Grief reaction    Assessment: Patient currently experiencing Grief and Anxiety.   Patient may benefit from Ongoing counseling.  Plan: Follow up with behavioral health clinician on : Patient will contact the office to schedule.  Renda Pontes, MSW, LCSW-A She/Her Behavioral Health Clinician [facility]  Internal Medicine Center

## 2024-06-04 NOTE — Patient Instructions (Signed)
 Katherine Haynes

## 2024-06-14 ENCOUNTER — Ambulatory Visit (INDEPENDENT_AMBULATORY_CARE_PROVIDER_SITE_OTHER)

## 2024-06-14 ENCOUNTER — Encounter: Payer: Self-pay | Admitting: Emergency Medicine

## 2024-06-14 ENCOUNTER — Ambulatory Visit: Admission: EM | Admit: 2024-06-14 | Discharge: 2024-06-14 | Disposition: A | Discharge status: Home / Self Care

## 2024-06-14 DIAGNOSIS — J209 Acute bronchitis, unspecified: Secondary | ICD-10-CM | POA: Diagnosis not present

## 2024-06-14 DIAGNOSIS — R051 Acute cough: Secondary | ICD-10-CM

## 2024-06-14 LAB — POC COVID19/FLU A&B COMBO
Covid Antigen, POC: NEGATIVE
Influenza A Antigen, POC: NEGATIVE
Influenza B Antigen, POC: NEGATIVE

## 2024-06-14 MED ORDER — GUAIFENESIN ER 600 MG PO TB12: 600.0000 mg | ORAL_TABLET | Freq: Two times a day (BID) | ORAL | 0 refills | Status: AC

## 2024-06-14 MED ORDER — BENZONATATE 100 MG PO CAPS
100.0000 mg | ORAL_CAPSULE | Freq: Three times a day (TID) | ORAL | 0 refills | Status: AC
Start: 2024-06-14 — End: ?

## 2024-06-14 MED ORDER — PREDNISONE 50 MG PO TABS
ORAL_TABLET | ORAL | 0 refills | Status: AC
Start: 2024-06-14 — End: ?

## 2024-06-14 NOTE — ED Provider Notes (Signed)
 [facility]    CSN: [id number] Arrival date & time: 06/14/24  1110      History   Chief Complaint Chief Complaint  Patient presents with   chest congestion   Cough   head congestion    HPI Katherine Haynes is a 73 y.o. female.   Patient presents today due to cough productive of yellow sputum, nasal congestion, head congestion, throat pain, and bodyaches for the past 8 days.  Patient states that initially she was starting to get better but states that she got worse yesterday.  Patient denies known sick contacts in the last 2 to 3 days.  Patient states she has been taking over-the-counter cold medications with initial relief of symptoms.  The history is provided by the patient.  Cough   Past Medical History:  Diagnosis Date   BPPV (benign paroxysmal positional vertigo) 12/08/2021   Healthcare maintenance 04/25/2017   Hyperlipidemia    Hypertension    LBBB (left bundle branch block) 10/26/2020   Beta blocker discontinued June of 2022     Menopause syndrome     Patient Active Problem List   Diagnosis Date Noted   Benign essential hypertension 04/18/2024   Nausea and vomiting 04/18/2024   Mild intermittent asthma 04/18/2024   Grief reaction 10/04/2023   Claudication 03/01/2023   Trochanteric bursitis of right hip and ITB syndrome 01/25/2023   SI (sacroiliac) joint dysfunction 06/01/2022   Encounter for screening mammogram for breast cancer 12/08/2021   Major depression 08/04/2021   Cervical myelopathy with cervical radiculopathy (HCC) 11/18/2020   Vasomotor symptoms due to menopause 01/07/2019   Lymphedema 12/11/2018   Insomnia 09/11/2017   Seasonal allergies 10/07/2010   Hyperlipidemia 05/14/2007   Essential hypertension 05/25/2006    Past Surgical History:  Procedure Laterality Date   CHOLECYSTECTOMY N/A 04/05/2017   Procedure: LAPAROSCOPIC CHOLECYSTECTOMY;  Surgeon: Kinsinger, Herlene Righter, MD;  Location: [facility];  Service: General;  Laterality:  N/A;   MOUTH SURGERY     TONSILLECTOMY     TRANSTHORACIC ECHOCARDIOGRAM  04/19/2019    EF 66 5%.  Mild LVH.  GR 1 DD.  Normal atrial sizes.  Mild aortic valve sclerosis but no stenosis.  Normal IVC.  Otherwise normal valves.   TUBAL LIGATION      OB History     Gravida  3   Para      Term      Preterm      AB      Living  3      SAB      IAB      Ectopic      Multiple      Live Births  3            Home Medications    Prior to Admission medications   Medication Sig Start Date End Date Taking? Authorizing Provider  amLODipine  (NORVASC ) 10 MG tablet Take 1 tablet (10 mg total) by mouth daily. 01/16/24  Yes Nguyen, Diana, MD  atorvastatin  (LIPITOR) 40 MG tablet Take 1 tablet (40 mg total) by mouth daily. 01/16/24  Yes Nguyen, Diana, MD  benzonatate (TESSALON) 100 MG capsule Take 1 capsule (100 mg total) by mouth every 8 (eight) hours. 06/14/24  Yes Andra Krabbe C, PA-C  escitalopram  (LEXAPRO ) 20 MG tablet Take 1 tablet (20 mg total) by mouth daily. 02/20/24  Yes Norrine Sharper, MD  furosemide  (LASIX ) 20 MG tablet Take 3 tablets (60 mg total) by mouth daily. 01/08/24  Yes McLendon, Michael, MD  guaiFENesin (MUCINEX) 600 MG 12 hr tablet Take 1 tablet (600 mg total) by mouth 2 (two) times daily for 10 days. 06/14/24 06/24/24 Yes Andra Krabbe C, PA-C  hydrOXYzine  (ATARAX ) 50 MG tablet Take 50 mg by mouth as needed.   Yes [provider]  potassium chloride  (KLOR-CON ) 10 MEQ tablet Take 1 tablet (10 mEq total) by mouth daily. 10/16/23  Yes Rosan Dayton BROCKS, DO  predniSONE  (DELTASONE ) 50 MG tablet Take 1 tab po daily for 5 days 06/14/24  Yes Andra Krabbe BROCKS, PA-C  albuterol  (VENTOLIN  HFA) 108 (90 Base) MCG/ACT inhaler Inhale 2 puffs into the lungs every 4 (four) hours as needed. Patient not taking: Reported on 06/14/2024    [provider]  diclofenac  Sodium (VOLTAREN ) 1 % GEL APPLY 4G TOPICALLY 4 TIMES DAILY Patient not taking: Reported  on 06/14/2024 02/10/23   Guilloud, Carolyn, MD  gabapentin  (NEURONTIN ) 300 MG capsule 1 capsule. Patient not taking: Reported on 06/14/2024    [provider]  methocarbamol  (ROBAXIN ) 750 MG tablet Take 2 tablets (1,500 mg total) by mouth every 8 (eight) hours as needed for muscle spasms. Patient not taking: Reported on 05/20/2024 03/25/24   Nguyen, Diana, MD  mirtazapine  (REMERON ) 7.5 MG tablet Take 1 tablet (7.5 mg total) by mouth at bedtime. Patient not taking: Reported on 06/14/2024 04/29/24   D'Mello, Rosalyn, DO  naproxen  (NAPROSYN ) 500 MG tablet Take 1 tablet (500 mg total) by mouth 2 (two) times daily with a meal. Patient not taking: Reported on 05/20/2024 12/08/21   Guilloud, Carolyn, MD  ramelteon  (ROZEREM ) 8 MG tablet Take 1 tablet (8 mg total) by mouth at bedtime as needed for sleep. Patient not taking: Reported on 06/14/2024 01/16/24 01/15/25  Nguyen, Diana, MD  traZODone  (DESYREL ) 50 MG tablet 1 tablet Orally PRN Patient not taking: Reported on 06/14/2024    [provider]    Family History Family History  Problem Relation Age of Onset   Heart disease Mother        enlarged heart   Hypertension Mother    Diabetes Mother    Breast cancer Sister    COPD Sister    Sarcoidosis Sister    Heart block Sister        s/p PPM   Hypertension Sister    Arthritis Sister    COPD Sister    Diabetes Sister    Hypertension Sister    Arthritis Sister    Healthy Sister    Heart disease Maternal Uncle        Bypass surg   Heart attack Maternal Grandmother        also had irregular heart beat requriring a pacemaker.   Hypertension Paternal Grandmother    Hypertension Brother    Hypertension Brother    Healthy Brother    Healthy Brother    Hypertension Other        In multiple relatives.     Social History Social History   Tobacco Use   Smoking status: Former    Passive exposure: Past   Smokeless tobacco: Never   Tobacco comments:     1 PPD for 2-3 years.  Quit ~8018.  Vaping Use   Vaping status: Never Used  Substance Use Topics   Alcohol use: No   Drug use: No     Allergies   Contrast media [iodinated contrast media], Penicillins, and Sulfonamide derivatives   Review of Systems Review of Systems  Respiratory:  Positive for  cough.      Physical Exam Triage Vital Signs ED Triage Vitals [06/14/24 1255]  Encounter Vitals Group     BP (!) 151/77     Girls Systolic BP Percentile      Girls Diastolic BP Percentile      Boys Systolic BP Percentile      Boys Diastolic BP Percentile      Pulse Rate 90     Resp 18     Temp 98.1 F (36.7 C)     Temp Source Oral     SpO2 99 %     Weight      Height      Head Circumference      Peak Flow      Pain Score 4     Pain Loc      Pain Education      Exclude from Growth Chart    No data found.  Updated Vital Signs BP 132/75 (BP Location: Right Arm)   Pulse 90   Temp 98.1 F (36.7 C) (Oral)   Resp 18   LMP 10/07/1990   SpO2 99%   Visual Acuity Right Eye Distance:   Left Eye Distance:   Bilateral Distance:    Right Eye Near:   Left Eye Near:    Bilateral Near:     Physical Exam Vitals and nursing note reviewed.  Constitutional:      General: She is not in acute distress.    Appearance: Normal appearance. She is not ill-appearing, toxic-appearing or diaphoretic.  HENT:     Nose: Congestion (moderately enlarged turbinates) present. No rhinorrhea.     Mouth/Throat:     Mouth: Mucous membranes are moist.     Pharynx: Oropharynx is clear. No oropharyngeal exudate or posterior oropharyngeal erythema.  Eyes:     General: No scleral icterus. Cardiovascular:     Rate and Rhythm: Normal rate and regular rhythm.     Heart sounds: Normal heart sounds.  Pulmonary:     Effort: Pulmonary effort is normal. No respiratory distress.     Breath sounds: Normal breath sounds. No wheezing or rhonchi.  Skin:    General: Skin is warm.  Neurological:     Mental Status: She is alert  and oriented to person, place, and time.  Psychiatric:        Mood and Affect: Mood normal.        Behavior: Behavior normal.      UC Treatments / Results  Labs (all labs ordered are listed, but only abnormal results are displayed) Labs Reviewed  POC COVID19/FLU A&B COMBO - Normal    EKG   Radiology DG Chest 2 View Result Date: 06/14/2024 EXAM: 2 VIEW(S) XRAY OF THE CHEST 06/14/2024 01:28:45 PM COMPARISON: 11/23/2020 CLINICAL HISTORY: cough FINDINGS: LUNGS AND PLEURA: Apical pleural scarring. No focal pulmonary opacity. No pleural effusion. No pneumothorax. HEART AND MEDIASTINUM: No acute abnormality of the cardiac and mediastinal silhouettes. BONES AND SOFT TISSUES: There are mild degenerative changes in the visualized thoracic spine. No acute osseous abnormality. IMPRESSION: 1. No acute cardiopulmonary findings. Electronically signed by: Donnice Mania MD 06/14/2024 01:46 PM EST RP Workstation: [id number]    Procedures Procedures (including critical care time)  Medications Ordered in UC Medications - No data to display  Initial Impression / Assessment and Plan / UC Course  I have reviewed the triage vital signs and the nursing notes.  Pertinent labs & imaging results that were available during my care of the patient  were reviewed by me and considered in my medical decision making (see chart for details).    Final Clinical Impressions(s) / UC Diagnoses   Final diagnoses:  Acute cough  Acute bronchitis, unspecified organism     Discharge Instructions      Chest xray is neg for pneumonia, neg for covid and flu.     ED Prescriptions     Medication Sig Dispense Auth. Provider   predniSONE  (DELTASONE ) 50 MG tablet Take 1 tab po daily for 5 days 5 tablet Andra Krabbe C, PA-C   guaiFENesin (MUCINEX) 600 MG 12 hr tablet Take 1 tablet (600 mg total) by mouth 2 (two) times daily for 10 days. 20 tablet Andra Krabbe C, PA-C   benzonatate (TESSALON) 100 MG  capsule Take 1 capsule (100 mg total) by mouth every 8 (eight) hours. 30 capsule Andra Krabbe BROCKS, PA-C      PDMP not reviewed this encounter.   Andra Krabbe BROCKS, PA-C 06/14/24 1357

## 2024-06-14 NOTE — ED Triage Notes (Addendum)
 Pt reports chest congestion, productive cough w/ yellow sputum, head congestion, sore throat, and body aches x8 days. Pt felt like symptoms were improving, but started getting worse again yesterday. Taking coricidin, tylenol , and dayquil/nyquil with minimal relief. Denies fevers.

## 2024-06-14 NOTE — Discharge Instructions (Addendum)
 Chest xray is neg for pneumonia, neg for covid and flu.

## 2024-06-17 ENCOUNTER — Encounter (HOSPITAL_COMMUNITY): Payer: Self-pay

## 2024-06-17 ENCOUNTER — Other Ambulatory Visit: Payer: Self-pay

## 2024-06-17 ENCOUNTER — Emergency Department (HOSPITAL_COMMUNITY)
Admission: EM | Admit: 2024-06-17 | Discharge: 2024-06-17 | Disposition: A | Discharge status: Home / Self Care | Attending: Emergency Medicine | Admitting: Emergency Medicine

## 2024-06-17 ENCOUNTER — Emergency Department (HOSPITAL_COMMUNITY)

## 2024-06-17 ENCOUNTER — Ambulatory Visit: Payer: Self-pay

## 2024-06-17 DIAGNOSIS — I10 Essential (primary) hypertension: Secondary | ICD-10-CM | POA: Insufficient documentation

## 2024-06-17 DIAGNOSIS — Z79899 Other long term (current) drug therapy: Secondary | ICD-10-CM | POA: Insufficient documentation

## 2024-06-17 DIAGNOSIS — J069 Acute upper respiratory infection, unspecified: Secondary | ICD-10-CM | POA: Diagnosis not present

## 2024-06-17 DIAGNOSIS — R0602 Shortness of breath: Secondary | ICD-10-CM

## 2024-06-17 DIAGNOSIS — R059 Cough, unspecified: Secondary | ICD-10-CM | POA: Diagnosis present

## 2024-06-17 LAB — CBC WITH DIFFERENTIAL/PLATELET
Abs Immature Granulocytes: 0.04 K/uL (ref 0.00–0.07)
Basophils Absolute: 0 K/uL (ref 0.0–0.1)
Basophils Relative: 0 %
Eosinophils Absolute: 0 K/uL (ref 0.0–0.5)
Eosinophils Relative: 0 %
HCT: 42 % (ref 36.0–46.0)
Hemoglobin: 13.8 g/dL (ref 12.0–15.0)
Immature Granulocytes: 1 %
Lymphocytes Relative: 16 %
Lymphs Abs: 1.3 K/uL (ref 0.7–4.0)
MCH: 30.9 pg (ref 26.0–34.0)
MCHC: 32.9 g/dL (ref 30.0–36.0)
MCV: 94 fL (ref 80.0–100.0)
Monocytes Absolute: 0.2 K/uL (ref 0.1–1.0)
Monocytes Relative: 2 %
Neutro Abs: 6.2 K/uL (ref 1.7–7.7)
Neutrophils Relative %: 81 %
Platelets: 312 K/uL (ref 150–400)
RBC: 4.47 MIL/uL (ref 3.87–5.11)
RDW: 12.7 % (ref 11.5–15.5)
WBC: 7.7 K/uL (ref 4.0–10.5)
nRBC: 0 % (ref 0.0–0.2)

## 2024-06-17 LAB — BASIC METABOLIC PANEL WITH GFR
Anion gap: 12 (ref 5–15)
BUN: 10 mg/dL (ref 8–23)
CO2: 28 mmol/L (ref 22–32)
Calcium: 8.9 mg/dL (ref 8.9–10.3)
Chloride: 101 mmol/L (ref 98–111)
Creatinine, Ser: 0.85 mg/dL (ref 0.44–1.00)
GFR, Estimated: >60 mL/min (ref >60)
Glucose, Bld: 111 mg/dL — ABNORMAL HIGH (ref 70–99)
Potassium: 3.7 mmol/L (ref 3.5–5.1)
Sodium: 141 mmol/L (ref 135–145)

## 2024-06-17 LAB — RESP PANEL BY RT-PCR (RSV, FLU A&B, COVID)  RVPGX2
Influenza A by PCR: NEGATIVE
Influenza B by PCR: NEGATIVE
Resp Syncytial Virus by PCR: NEGATIVE
SARS Coronavirus 2 by RT PCR: NEGATIVE

## 2024-06-17 MED ORDER — AZITHROMYCIN 250 MG PO TABS: 250.0000 mg | ORAL_TABLET | Freq: Every day | ORAL | 0 refills | Status: AC

## 2024-06-17 NOTE — ED Notes (Signed)
 Awaiting patient from lobby.

## 2024-06-17 NOTE — Telephone Encounter (Signed)
 FYI Only or Action Required?: FYI only for provider: ED advised.  Patient was last seen in primary care on 04/29/2024 by D'Mello, Rosalyn, DO.  Called Nurse Triage reporting Cough and Chest Pain.  Symptoms began a week ago.  Interventions attempted: Prescription medications: mucinex, tessalon, prednisone .  Symptoms are: gradually worsening.  Triage Disposition: Go to ED Now (Notify PCP)  Patient/caregiver understands and will follow disposition?: Yes  Copied from CRM #[id number]. Topic: Clinical - Red Word Triage >> Jun 17, 2024  9:49 AM Farrel B wrote: [facility] that prompted transfer to Nurse Triage: diagnosed by Urgent Care 12/28 with acute bronchitis, coughing up and blowing yellow mucus, shortness of breath, bodyaches and severe headaches Reason for Disposition  Chest pain  (Exception: MILD central chest pain, present only when coughing.)  Answer Assessment - Initial Assessment Questions Pt reports going to UC 11/28 and all xrays were negative but she is feeling worse today. Cough is productive and causing SOB d/t intensity. She reports mild chest pain when coughing with SOB. Based on symptoms and audible symptoms heard by RN. ED recommended. Pt denies any current chest pain or difficulty breathing. Pt stated she had support person to take her to ED. Patient agrees with plan of care.    1. ONSET: When did the cough begin?      Roughly a week   2. SEVERITY: How bad is the cough today?      Worse today than when pt when to [facility] 11/28   3. SPUTUM: Describe the color of your sputum (e.g., none, dry cough; clear, white, yellow, green)     Was clear but now more yellow  4. HEMOPTYSIS: Are you coughing up any blood? If Yes, ask: How much? (e.g., flecks, streaks, tablespoons, etc.)     None  5. DIFFICULTY BREATHING: Are you having difficulty breathing? If Yes, ask: How bad is it? (e.g., mild, moderate, severe)      Mild SOB with coughing and exertion  6. FEVER: Do you  have a fever? If Yes, ask: What is your temperature, how was it measured, and when did it start?     Pt has not check; pt reports she does not feel feverish but did state that she has the chills then sweats.   7. CARDIAC HISTORY: Do you have any history of heart disease? (e.g., heart attack, congestive heart failure)      L branch BB  8. LUNG HISTORY: Do you have any history of lung disease?  (e.g., pulmonary embolus, asthma, emphysema)     Childhood asthma  9. PE RISK FACTORS: Do you have a history of blood clots? (or: recent major surgery, recent prolonged travel, bedridden)     None  10. OTHER SYMPTOMS: Do you have any other symptoms? (e.g., runny nose, wheezing, chest pain)       Pt reports mild, intermittent chest pain; runny nose  Protocols used: Cough - Acute Productive-A-AH

## 2024-06-17 NOTE — ED Triage Notes (Signed)
 States she was seen at [facility] 11/28 with c/o cough and nasal congestion , states she called her PCP today because she isn't getting better, and was told to come to the ED. For further eval

## 2024-06-17 NOTE — Medical Student Note (Incomplete)
 [facility] DEPT Provider Student Note For educational purposes for Medical, PA and NP students only and not part of the legal medical record.   CSN: [id number] Arrival date & time: 06/17/24  1111      History   Chief Complaint Chief Complaint  Patient presents with   flu like sx    HPI Katherine Haynes is a 73 y.o. female.  HPI  Past Medical History:  Diagnosis Date   BPPV (benign paroxysmal positional vertigo) 12/08/2021   Healthcare maintenance 04/25/2017   Hyperlipidemia    Hypertension    LBBB (left bundle branch block) 10/26/2020   Beta blocker discontinued June of 2022     Menopause syndrome     Patient Active Problem List   Diagnosis Date Noted   Benign essential hypertension 04/18/2024   Nausea and vomiting 04/18/2024   Mild intermittent asthma 04/18/2024   Grief reaction 10/04/2023   Claudication 03/01/2023   Trochanteric bursitis of right hip and ITB syndrome 01/25/2023   SI (sacroiliac) joint dysfunction 06/01/2022   Encounter for screening mammogram for breast cancer 12/08/2021   Major depression 08/04/2021   Cervical myelopathy with cervical radiculopathy (HCC) 11/18/2020   Vasomotor symptoms due to menopause 01/07/2019   Lymphedema 12/11/2018   Insomnia 09/11/2017   Seasonal allergies 10/07/2010   Hyperlipidemia 05/14/2007   Essential hypertension 05/25/2006    Past Surgical History:  Procedure Laterality Date   CHOLECYSTECTOMY N/A 04/05/2017   Procedure: LAPAROSCOPIC CHOLECYSTECTOMY;  Surgeon: Kinsinger, Herlene Righter, MD;  Location: [facility];  Service: General;  Laterality: N/A;   MOUTH SURGERY     TONSILLECTOMY     TRANSTHORACIC ECHOCARDIOGRAM  04/19/2019    EF 66 5%.  Mild LVH.  GR 1 DD.  Normal atrial sizes.  Mild aortic valve sclerosis but no stenosis.  Normal IVC.  Otherwise normal valves.   TUBAL LIGATION      OB History     Gravida  3   Para      Term      Preterm      AB      Living  3      SAB      IAB       Ectopic      Multiple      Live Births  3            Home Medications    Prior to Admission medications   Medication Sig Start Date End Date Taking? Authorizing Provider  albuterol  (VENTOLIN  HFA) 108 (90 Base) MCG/ACT inhaler Inhale 2 puffs into the lungs every 4 (four) hours as needed. Patient not taking: Reported on 06/14/2024    [provider]  amLODipine  (NORVASC ) 10 MG tablet Take 1 tablet (10 mg total) by mouth daily. 01/16/24   Nguyen, Diana, MD  atorvastatin  (LIPITOR) 40 MG tablet Take 1 tablet (40 mg total) by mouth daily. 01/16/24   Nguyen, Diana, MD  benzonatate (TESSALON) 100 MG capsule Take 1 capsule (100 mg total) by mouth every 8 (eight) hours. 06/14/24   Andra Krabbe C, PA-C  diclofenac  Sodium (VOLTAREN ) 1 % GEL APPLY 4G TOPICALLY 4 TIMES DAILY Patient not taking: Reported on 06/14/2024 02/10/23   Guilloud, Carolyn, MD  escitalopram  (LEXAPRO ) 20 MG tablet Take 1 tablet (20 mg total) by mouth daily. 02/20/24   Norrine Sharper, MD  furosemide  (LASIX ) 20 MG tablet Take 3 tablets (60 mg total) by mouth daily. 01/08/24   Norrine Sharper, MD  gabapentin  (NEURONTIN ) 300  MG capsule 1 capsule. Patient not taking: Reported on 06/14/2024    [provider]  guaiFENesin (MUCINEX) 600 MG 12 hr tablet Take 1 tablet (600 mg total) by mouth 2 (two) times daily for 10 days. 06/14/24 06/24/24  Andra Corean BROCKS, PA-C  hydrOXYzine  (ATARAX ) 50 MG tablet Take 50 mg by mouth as needed.    [provider]  methocarbamol  (ROBAXIN ) 750 MG tablet Take 2 tablets (1,500 mg total) by mouth every 8 (eight) hours as needed for muscle spasms. Patient not taking: Reported on 05/20/2024 03/25/24   Nguyen, Diana, MD  mirtazapine  (REMERON ) 7.5 MG tablet Take 1 tablet (7.5 mg total) by mouth at bedtime. Patient not taking: Reported on 06/14/2024 04/29/24   D'Mello, Rosalyn, DO  naproxen  (NAPROSYN ) 500 MG tablet Take 1 tablet (500 mg total) by mouth 2 (two) times daily with  a meal. Patient not taking: Reported on 05/20/2024 12/08/21   Guilloud, Carolyn, MD  potassium chloride  (KLOR-CON ) 10 MEQ tablet Take 1 tablet (10 mEq total) by mouth daily. 10/16/23   Rosan Dayton BROCKS, DO  predniSONE  (DELTASONE ) 50 MG tablet Take 1 tab po daily for 5 days 06/14/24   Andra Corean BROCKS, PA-C  ramelteon  (ROZEREM ) 8 MG tablet Take 1 tablet (8 mg total) by mouth at bedtime as needed for sleep. Patient not taking: Reported on 06/14/2024 01/16/24 01/15/25  Nguyen, Diana, MD  traZODone  (DESYREL ) 50 MG tablet 1 tablet Orally PRN Patient not taking: Reported on 06/14/2024    [provider]    Family History Family History  Problem Relation Age of Onset   Heart disease Mother        enlarged heart   Hypertension Mother    Diabetes Mother    Breast cancer Sister    COPD Sister    Sarcoidosis Sister    Heart block Sister        s/p PPM   Hypertension Sister    Arthritis Sister    COPD Sister    Diabetes Sister    Hypertension Sister    Arthritis Sister    Healthy Sister    Heart disease Maternal Uncle        Bypass surg   Heart attack Maternal Grandmother        also had irregular heart beat requriring a pacemaker.   Hypertension Paternal Grandmother    Hypertension Brother    Hypertension Brother    Healthy Brother    Healthy Brother    Hypertension Other        In multiple relatives.     Social History Social History   Tobacco Use   Smoking status: Former    Passive exposure: Past   Smokeless tobacco: Never   Tobacco comments:     1 PPD for 2-3 years. Quit ~8018.  Vaping Use   Vaping status: Never Used  Substance Use Topics   Alcohol use: No   Drug use: No     Allergies   Contrast media [iodinated contrast media], Penicillins, and Sulfonamide derivatives   Review of Systems Review of Systems   Physical Exam Updated Vital Signs BP 139/68   Pulse 87   Temp 97.9 F (36.6 C)   Resp 16   Ht 5' 6 (1.676 m)   Wt 66.2 kg   LMP  10/07/1990   SpO2 96%   BMI 23.57 kg/m   Physical Exam   ED Treatments / Results  Labs (all labs ordered are listed, but only abnormal results are  displayed) Labs Reviewed  RESP PANEL BY RT-PCR (RSV, FLU A&B, COVID)  [id number]  CBC WITH DIFFERENTIAL/PLATELET  BASIC METABOLIC PANEL WITH GFR    EKG  Radiology No results found.  Procedures Procedures (including critical care time)  Medications Ordered in ED Medications - No data to display   Initial Impression / Assessment and Plan / ED Course  I have reviewed the triage vital signs and the nursing notes.  Pertinent labs & imaging results that were available during my care of the patient were reviewed by me and considered in my medical decision making (see chart for details).     ***  Final Clinical Impressions(s) / ED Diagnoses   Final diagnoses:  None    New Prescriptions New Prescriptions   No medications on file

## 2024-06-17 NOTE — ED Notes (Signed)
 Pt in bed, pt states that she has a slight headache, but is ready to go home, read and reviewed d/c instructions with pt, pt verbalized understanding d/c and follow up,resps even and unlabored, pt ambulatory from department.

## 2024-06-17 NOTE — ED Notes (Signed)
 Pt ambulated by NT, pt said there was a little bit of on set dizziness present while walking.

## 2024-06-17 NOTE — ED Provider Notes (Signed)
  Physical Exam  BP 128/67 (BP Location: Left Arm)   Pulse 68   Temp 98.2 F (36.8 C) (Oral)   Resp 17   Ht 5' 6 (1.676 m)   Wt 66.2 kg   LMP 10/07/1990   SpO2 99%   BMI 23.57 kg/m   Physical Exam Vitals and nursing note reviewed.  Constitutional:      General: She is not in acute distress.    Appearance: She is well-developed.  HENT:     Head: Normocephalic and atraumatic.  Eyes:     Conjunctiva/sclera: Conjunctivae normal.  Cardiovascular:     Rate and Rhythm: Normal rate and regular rhythm.     Heart sounds: No murmur heard. Pulmonary:     Effort: Pulmonary effort is normal. No respiratory distress.     Breath sounds: Normal breath sounds.  Abdominal:     Palpations: Abdomen is soft.     Tenderness: There is no abdominal tenderness.  Musculoskeletal:        General: No swelling.     Cervical back: Neck supple.  Skin:    General: Skin is warm and dry.     Capillary Refill: Capillary refill takes less than 2 seconds.  Neurological:     Mental Status: She is alert.  Psychiatric:        Mood and Affect: Mood normal.     Procedures  Procedures  ED Course / MDM   Clinical Course as of 06/18/24 0018  Mon Jun 17, 2024  1545 S, pending walking, d/c on azithro for sob for 3-4 days.  [BS]  1642 Patient was able to be walked multiple times, stated that she felt mildly lightheaded but was able to walk without support.  Overall patient workup is reassuring, however with ongoing symptoms, will provide azithromycin  for atypical pneumonia for home. [BS]    Clinical Course User Index [BS] Arlee Katz, MD   Medical Decision Making Amount and/or Complexity of Data Reviewed Labs: ordered. Radiology: ordered.  Risk Prescription drug management.   Workup overall reassuring in the ED, continues to have significant cough, as well as generalized fatigue.  This is likely secondary to URI symptoms, however possible concern for atypical pneumonia at this time.  Will  provide prescription for azithromycin .  Overall with reassuring workup, patient well-hydrated, afebrile, and hemodynamically stable, patient is overall stable for discharge with continued management of URI symptoms, recommended to take Tylenol  Motrin , as well as complete course of azithromycin .  Recommended follow-up with PCP in 3 to 4 days.       Arlee Katz, MD 06/18/24 9981    Tonia Chew, MD 06/22/24 [phone]

## 2024-06-17 NOTE — ED Provider Notes (Signed)
 [place] EMERGENCY DEPARTMENT AT [facility] Provider Note   CSN: [id number] Arrival date & time: 06/17/24  1111     Patient presents with: flu like sx   Katherine Haynes is a 73 y.o. female.  {Add pertinent medical, surgical, social history, OB history to YEP:67052} Patient with history of hypertension, hyperlipidemia presents today with complaints of cough and congestion. Reports that she started feeling mild cold symptoms about a week ago, reports symptoms got worse on 11/27, went to urgent care on 11/28 and was prescribed Mucinex, steroids, and tessalon which she has been taking without significant improvement. Denies any known sick contacts. Reports that she has started having some soreness in her left chest area which is only present when she coughs. Cough is productive of yellow sputum, reports feeling warm intermittently but has not checked her temperature. Did a video visit with her pcp who recommended she come in for evaluation. Denies leg pain or leg swelling, recent travel or recent surgeries.  The history is provided by the patient. No language interpreter was used.       Prior to Admission medications   Medication Sig Start Date End Date Taking? Authorizing Provider  albuterol  (VENTOLIN  HFA) 108 (90 Base) MCG/ACT inhaler Inhale 2 puffs into the lungs every 4 (four) hours as needed. Patient not taking: Reported on 06/14/2024    [provider]  amLODipine  (NORVASC ) 10 MG tablet Take 1 tablet (10 mg total) by mouth daily. 01/16/24   Nguyen, Diana, MD  atorvastatin  (LIPITOR) 40 MG tablet Take 1 tablet (40 mg total) by mouth daily. 01/16/24   Nguyen, Diana, MD  benzonatate (TESSALON) 100 MG capsule Take 1 capsule (100 mg total) by mouth every 8 (eight) hours. 06/14/24   Andra Corean BROCKS, PA-C  diclofenac  Sodium (VOLTAREN ) 1 % GEL APPLY 4G TOPICALLY 4 TIMES DAILY Patient not taking: Reported on 06/14/2024 02/10/23   Guilloud, Carolyn, MD  escitalopram   (LEXAPRO ) 20 MG tablet Take 1 tablet (20 mg total) by mouth daily. 02/20/24   Norrine Sharper, MD  furosemide  (LASIX ) 20 MG tablet Take 3 tablets (60 mg total) by mouth daily. 01/08/24   Norrine Sharper, MD  gabapentin  (NEURONTIN ) 300 MG capsule 1 capsule. Patient not taking: Reported on 06/14/2024    [provider]  guaiFENesin (MUCINEX) 600 MG 12 hr tablet Take 1 tablet (600 mg total) by mouth 2 (two) times daily for 10 days. 06/14/24 06/24/24  Andra Corean BROCKS, PA-C  hydrOXYzine  (ATARAX ) 50 MG tablet Take 50 mg by mouth as needed.    [provider]  methocarbamol  (ROBAXIN ) 750 MG tablet Take 2 tablets (1,500 mg total) by mouth every 8 (eight) hours as needed for muscle spasms. Patient not taking: Reported on 05/20/2024 03/25/24   Nguyen, Diana, MD  mirtazapine  (REMERON ) 7.5 MG tablet Take 1 tablet (7.5 mg total) by mouth at bedtime. Patient not taking: Reported on 06/14/2024 04/29/24   D'Mello, Rosalyn, DO  naproxen  (NAPROSYN ) 500 MG tablet Take 1 tablet (500 mg total) by mouth 2 (two) times daily with a meal. Patient not taking: Reported on 05/20/2024 12/08/21   Guilloud, Carolyn, MD  potassium chloride  (KLOR-CON ) 10 MEQ tablet Take 1 tablet (10 mEq total) by mouth daily. 10/16/23   Rosan Dayton BROCKS, DO  predniSONE  (DELTASONE ) 50 MG tablet Take 1 tab po daily for 5 days 06/14/24   Andra Corean BROCKS, PA-C  ramelteon  (ROZEREM ) 8 MG tablet Take 1 tablet (8 mg total) by mouth at bedtime as needed  for sleep. Patient not taking: Reported on 06/14/2024 01/16/24 01/15/25  Nguyen, Diana, MD  traZODone  (DESYREL ) 50 MG tablet 1 tablet Orally PRN Patient not taking: Reported on 06/14/2024    [provider]    Allergies: Contrast media [iodinated contrast media], Penicillins, and Sulfonamide derivatives    Review of Systems  HENT:  Positive for congestion.   Respiratory:  Positive for cough.   All other systems reviewed and are negative.   Updated Vital Signs BP 139/68    Pulse 87   Temp 97.9 F (36.6 C)   Resp 16   Ht 5' 6 (1.676 m)   Wt 66.2 kg   LMP 10/07/1990   SpO2 96%   BMI 23.57 kg/m   Physical Exam Vitals and nursing note reviewed.  Constitutional:      General: She is not in acute distress.    Appearance: Normal appearance. She is normal weight. She is not ill-appearing, toxic-appearing or diaphoretic.  HENT:     Head: Normocephalic and atraumatic.  Cardiovascular:     Rate and Rhythm: Normal rate and regular rhythm.     Heart sounds: Normal heart sounds.  Pulmonary:     Effort: Pulmonary effort is normal. No respiratory distress.     Breath sounds: Normal breath sounds.  Abdominal:     General: Abdomen is flat.     Palpations: Abdomen is soft.     Tenderness: There is no abdominal tenderness.  Musculoskeletal:        General: Normal range of motion.     Cervical back: Normal range of motion.     Right lower leg: No edema.     Left lower leg: No edema.  Skin:    General: Skin is warm and dry.  Neurological:     General: No focal deficit present.     Mental Status: She is alert.  Psychiatric:        Mood and Affect: Mood normal.        Behavior: Behavior normal.     (all labs ordered are listed, but only abnormal results are displayed) Labs Reviewed  RESP PANEL BY RT-PCR (RSV, FLU A&B, COVID)  RVPGX2  CBC WITH DIFFERENTIAL/PLATELET  BASIC METABOLIC PANEL WITH GFR    EKG: None  Radiology: No results found.  {Document cardiac monitor, telemetry assessment procedure when appropriate:32947} Procedures   Medications Ordered in the ED - No data to display  Clinical Course as of 06/17/24 1557  Mon Jun 17, 2024  1545 S, pending walking, d/c on azithro for sob for 3-4 days.  [BS]    Clinical Course User Index [BS] Arlee Katz, MD   {Click here for ABCD2, HEART and other calculators REFRESH Note before signing:1}                              Medical Decision Making Amount and/or Complexity of Data  Reviewed Labs: ordered. Radiology: ordered.   This patient is a 73 y.o. female who presents to the ED for concern of cough, congestion, this involves an extensive number of treatment options, and is a complaint that carries with it a high risk of complications and morbidity. The emergent differential diagnosis prior to evaluation includes, but is not limited to,  URI, pneumonia, pneumothorax . This is not an exhaustive differential.   Past Medical History / Co-morbidities / Social History:  has a past medical history of BPPV (benign paroxysmal positional vertigo) (12/08/2021),  Healthcare maintenance (04/25/2017), Hyperlipidemia, Hypertension, LBBB (left bundle branch block) (10/26/2020), and Menopause syndrome.  Additional history: Chart reviewed. Pertinent results include: seen on 11/28, negative covid and flu, CXR clear, discharged with prednisone , mucinex, and tessalon  Physical Exam: Physical exam performed. The pertinent findings include: persistent cough noted on exam, however speaking in complete sentences in no acute distress. Lung sounds CTA  Lab Tests: I ordered, and personally interpreted labs.  The pertinent results include:  RVP pending, no acute laboratory abnormalities   Imaging Studies: I ordered imaging studies including CXR. I independently visualized and interpreted imaging which showed NAD, possible COPD. I agree with the radiologist interpretation.   Cardiac Monitoring:  The patient was maintained on a cardiac monitor.  My attending physician viewed and interpreted the cardiac monitored which showed an underlying rhythm of: sinus rhythm, no STEMI. I agree with this interpretation.  Disposition:  Patients RVP pending at shift change. Also reported some dyspnea, pending ambulation with pulse ox as well. If she does okay with this, suspect d/c with outpatient follow-up and return precautions. Symptoms consistent with URI, not consistent with COPD seen on CXR. No signs or  symptoms to suggest ACS/PE.  Care handoff to Morene Barrio, MD at shift change. Please see their note for continued evaluation and disposition  Final diagnoses:  None    ED Discharge Orders     None

## 2024-06-27 ENCOUNTER — Other Ambulatory Visit: Payer: Self-pay

## 2024-06-27 ENCOUNTER — Ambulatory Visit

## 2024-06-27 VITALS — BP 130/70 | HR 88 | Temp 98.4°F | Ht 66.0 in | Wt 145.8 lb

## 2024-06-27 DIAGNOSIS — Y939 Activity, unspecified: Secondary | ICD-10-CM

## 2024-06-27 DIAGNOSIS — M7061 Trochanteric bursitis, right hip: Secondary | ICD-10-CM | POA: Diagnosis not present

## 2024-06-27 DIAGNOSIS — Y999 Unspecified external cause status: Secondary | ICD-10-CM

## 2024-06-27 DIAGNOSIS — Y929 Unspecified place or not applicable: Secondary | ICD-10-CM | POA: Diagnosis not present

## 2024-06-27 DIAGNOSIS — S76112A Strain of left quadriceps muscle, fascia and tendon, initial encounter: Secondary | ICD-10-CM | POA: Diagnosis not present

## 2024-06-27 NOTE — Patient Instructions (Addendum)
 Thank you, Ms.Laiza D Silbaugh for allowing us  to provide your care today. Today we discussed the following:  - Your right hip pain seems consistent with your prior right hip greater trochanteric bursitis which we gave you an injection for today. Avoid strenuous activities for the next week and ice your hip if you experience any soreness  - Your left leg was symptoms seem consistent with a quad strain. I am recommending icing the area for ~20 min 2-3x/day, Voltaren  gel, lidocaine  patches, and ACE wrap for comfort over your quad. You may use Tylenol  as needed if this helps. Do not begin the quad strain exercises/stretches until your pain has improved; if any stretch or exercises causes increased pain, do not do it   Referrals ordered today:   Referral Orders         Ambulatory referral to Physical Therapy        Remember: Please let us  know if you are not improving in the next 2-3 weeks.  Should you have any questions or concerns please call the Internal Medicine Clinic at [phone].     Doyal Miyamoto, MD [facility]

## 2024-06-27 NOTE — Progress Notes (Signed)
 Patient name: Katherine Haynes Date of birth: 1950/07/23 Date of visit: 06/30/2024  Type of visit: Established Patient Office Visit   Subjective   Chief concern:  Chief Complaint  Patient presents with   Hip Pain    Pt states having right hip pain being going on since last year.  Pt states left hip swelling for about an week.   Hospitalization Follow-up    GENIFER Haynes is a 73 y.o. female with a history of HTN, HLD, MDD, insomnia, lower leg venous insufficiency, cervical myelopathy,  who presents to [facility] clinic for ED follow up.  Patient presented to the ED on 06/14/2024 and again on 06/17/2024 for cough productive of yellow sputum, nasal and head congestion, sore throat, body aches and fatigue for 8 days which were attributed to URI, though there was mild concern for atypical pneumonia.  Patient was given azithromycin  and advised to take Tylenol  and Motrin  as needed, and then to follow-up with PCP. She is improving symptomatically, and her main concern is regarding her right hip and left upper thigh.   Patient reports that her right hip has started bothering her again.  She received a right hip bursa injection for right hip greater trochanteric bursitis on 05/18/2023, reports that this improved her symptoms.  She would like to have another injection today.  She also mentions that about a week ago her left upper leg began bothering her.  She cannot recall a specific mechanism of injury, and she denies any recent falls, or known trauma to the area.  She notes that squatting makes the pain worse, and she will occasionally feel a twinge while she is walking.  She has not tried stairs yet.  Since it began bothering her, she has tried a heating pad which she states helps a little bit, Tylenol , and Aspercreme.  She states that it feels like a  pulling pain.  She denies any numbness or tingling of her left leg.  She denies any pain otherwise in her leg.  She does have a history of bilateral  leg lymphedema, with her left leg typically being more swollen one.  She denies any history of left leg or low back surgery.  ROS: Negative unless otherwise listed in the HPI.  Patient Active Problem List   Diagnosis Date Noted   Insomnia 09/11/2017    Priority: 1.   Quadriceps strain, left, initial encounter 06/30/2024   Nausea and vomiting 04/18/2024   Mild intermittent asthma 04/18/2024   Grief reaction 10/04/2023   Claudication 03/01/2023   Trochanteric bursitis of right hip and ITB syndrome 01/25/2023   SI (sacroiliac) joint dysfunction 06/01/2022   Encounter for screening mammogram for breast cancer 12/08/2021   Major depression 08/04/2021   Cervical myelopathy with cervical radiculopathy (HCC) 11/18/2020   Vasomotor symptoms due to menopause 01/07/2019   Lymphedema 12/11/2018   Seasonal allergies 10/07/2010   Hyperlipidemia 05/14/2007   Essential hypertension 05/25/2006     Past Surgical History:  Procedure Laterality Date   CHOLECYSTECTOMY N/A 04/05/2017   Procedure: LAPAROSCOPIC CHOLECYSTECTOMY;  Surgeon: Kinsinger, Herlene Righter, MD;  Location: [facility];  Service: General;  Laterality: N/A;   MOUTH SURGERY     TONSILLECTOMY     TRANSTHORACIC ECHOCARDIOGRAM  04/19/2019    EF 66 5%.  Mild LVH.  GR 1 DD.  Normal atrial sizes.  Mild aortic valve sclerosis but no stenosis.  Normal IVC.  Otherwise normal valves.   TUBAL LIGATION       Current  Outpatient Medications  Medication Instructions   albuterol  (VENTOLIN  HFA) 108 (90 Base) MCG/ACT inhaler 2 puffs, Every 4 hours PRN   amLODipine  (NORVASC ) 10 mg, Oral, Daily   atorvastatin  (LIPITOR) 40 mg, Oral, Daily   azithromycin  (ZITHROMAX ) 250 mg, Oral, Daily, Take first 2 tablets together, then 1 every day until finished.   benzonatate  (TESSALON ) 100 mg, Oral, Every 8 hours   diclofenac  Sodium (VOLTAREN ) 1 % GEL APPLY 4G TOPICALLY 4 TIMES DAILY   escitalopram  (LEXAPRO ) 20 mg, Oral, Daily   furosemide  (LASIX ) 60 mg, Oral, Daily    gabapentin  (NEURONTIN ) 300 MG capsule 1 capsule   hydrOXYzine  (ATARAX ) 50 mg, As needed   methocarbamol  (ROBAXIN ) 1,500 mg, Oral, Every 8 hours PRN   mirtazapine  (REMERON ) 7.5 mg, Oral, Daily at bedtime   naproxen  (NAPROSYN ) 500 mg, Oral, 2 times daily with meals   potassium chloride  (KLOR-CON ) 10 MEQ tablet 10 mEq, Oral, Daily   predniSONE  (DELTASONE ) 50 MG tablet Take 1 tab po daily for 5 days   ramelteon  (ROZEREM ) 8 mg, Oral, At bedtime PRN   traZODone  (DESYREL ) 50 MG tablet 1 tablet Orally PRN    Social History[1]    Objective  Today's Vitals   06/27/24 1421  BP: 130/70  Pulse: 88  Temp: 98.4 F (36.9 C)  TempSrc: Oral  SpO2: 99%  Weight: 145 lb 12.8 oz (66.1 kg)  Height: 5' 6 (1.676 m)  Body mass index is 23.53 kg/m.   Physical Exam:   Constitutional: well-appearing female sitting in exam chair, in no acute distress. Ambulates without use of assistance device  HEENT: normocephalic atraumatic, mucous membranes moist Eyes: conjunctiva non-erythematous Cardiovascular: regular rate and rhythm, bilateral radial pulses 2+, bilateral dorsal pedal pulses 2+, brisk capillary refill bilateral feet and hands  Pulmonary/Chest: normal work of breathing on room air, lungs clear to auscultation bilaterally Abdominal: soft, non-tender, non-distended MSK: normal bulk and tone. Right hip: tender to palpation over Greater trochanter. Left leg: tender to palpation mid-belly of quadriceps, mild swelling compared to right, 4/5 strength knee extension with pain, 4/5 hip flexion with pain, 5/5 knee flexion, 5/5 hip extension  Neurological: alert & oriented x 3 Skin: warm and dry Psych: mood calm, behavior normal, thought content normal, judgement normal      The 10-year ASCVD risk score (Arnett DK, et al., 2019) is: 12%   Values used to calculate the score:     Age: 85 years     Clinically relevant sex: Female     Is Non-Hispanic African American: Yes     Diabetic: No     Tobacco  smoker: No     Systolic Blood Pressure: 130 mmHg     Is BP treated: Yes     HDL Cholesterol: 85 mg/dL     Total Cholesterol: 171 mg/dL      Assessment & Plan  Problem List Items Addressed This Visit     Trochanteric bursitis of right hip and ITB syndrome - Primary   Patient has a history of right hip greater trochanteric bursitis for which she received a steroid injection by sports medicine on 05/18/2023.  She states that this worked well for her for some time, though the pain has returned.  On physical exam she is tender directly over her right hip greater trochanter, with pain feeling exactly as it had previously during a flare.  She would like to proceed with steroid injection today.  I performed right hip greater trochanter bursa steroid injection with Dr. Raguel  present, after receiving verbal and written consent by the patient.  Please see separate documentation for procedure documentation.  Instructed the patient to not exercise or go on any long walks for the next 7 to 10 days, and that she may feel soreness at the injection site and she may ice as needed. - Given 4 mL lidocaine  : Kenalog 40 mg injection in right hip greater trochanter bursa       Relevant Orders   Ambulatory referral to Physical Therapy   Joint Injection/Arthrocentesis   Quadriceps strain, left, initial encounter   Patient presents with 1 week of symptoms consistent with left mid belly quad strain.  She cannot recall a specific mechanism of injury, though she has tenderness to palpation of the mid belly of the quadriceps, 4/5 strength of knee extension with pain, 4/5 strength of hip flexion with pain, otherwise normal exam.  We discussed conservative treatment including ice or heat, depending on which feels better.  We discussed lidocaine  patches, Voltaren  gel, and she may continue Tylenol  as needed.  I have provided her with a 6 inch Ace wrap, which she may use for comfort, instructing her to wrap it distally to  approximately, not too tightly.  I have also provided her with quadricep strain strengthening/stretching exercises, though instructed her to not begin these for at least another week, and if she experiences pain with any of them then to stop.  I have placed a formal referral for physical therapy she will let us  know if she is not improving, and we will refer her to our orthopedic colleagues if needed. - PT referral - 6-inch ACE wrap provided - RICE, Tylenol , lidocaine  patches, IcyHot, Voltaren  gel - referral to Orthopedics if not improving       Relevant Orders   Ambulatory referral to Physical Therapy    Return if symptoms worsen or fail to improve.  Patient discussed with Dr. Jeanelle, who also saw and evaluated the patient.  Doyal Miyamoto, MD [facility]  PGY-1 06/30/2024, 4:58 PM      [1]  Social History Tobacco Use   Smoking status: Former    Passive exposure: Past   Smokeless tobacco: Never   Tobacco comments:     1 PPD for 2-3 years. Quit ~8018.  Vaping Use   Vaping status: Never Used  Substance Use Topics   Alcohol use: No   Drug use: No

## 2024-06-27 NOTE — Progress Notes (Unsigned)
 Joint Injection/Arthrocentesis  Date/Time: 06/27/2024 4:49 PM  Performed by: Leontine Lapine, MD Authorized by: Jeanelle Layman CROME, MD  Indications: pain  Body area: hip Joint: right hip Local anesthesia used: no  Anesthesia: Local anesthesia used: no  Sedation: Patient sedated: no  Preparation: Patient was prepped and draped in the usual sterile fashion. Needle gauge: 23. Ultrasound guidance: no Approach: lateral Triamcinolone amount: 40 mg Lidocaine  1% amount: 4 mL Patient tolerance: patient tolerated the procedure well with no immediate complications Comments: Right hip greater trochanter bursa injection

## 2024-06-30 DIAGNOSIS — S76112A Strain of left quadriceps muscle, fascia and tendon, initial encounter: Secondary | ICD-10-CM | POA: Insufficient documentation

## 2024-06-30 NOTE — Assessment & Plan Note (Addendum)
 Patient has a history of right hip greater trochanteric bursitis for which she received a steroid injection by sports medicine on 05/18/2023.  She states that this worked well for her for some time, though the pain has returned.  On physical exam she is tender directly over her right hip greater trochanter, with pain feeling exactly as it had previously during a flare.  She would like to proceed with steroid injection today.  I performed right hip greater trochanter bursa steroid injection with Dr. Raguel present, after receiving verbal and written consent by the patient.  Please see separate documentation for procedure documentation.  Instructed the patient to not exercise or go on any long walks for the next 7 to 10 days, and that she may feel soreness at the injection site and she may ice as needed. - Given 4 mL lidocaine  1% : Kenalog 40 mg injection in right hip greater trochanter bursa

## 2024-06-30 NOTE — Assessment & Plan Note (Signed)
 Patient presents with 1 week of symptoms consistent with left mid belly quad strain.  She cannot recall a specific mechanism of injury, though she has tenderness to palpation of the mid belly of the quadriceps, 4/5 strength of knee extension with pain, 4/5 strength of hip flexion with pain, otherwise normal exam.  We discussed conservative treatment including ice or heat, depending on which feels better.  We discussed lidocaine  patches, Voltaren  gel, and she may continue Tylenol  as needed.  I have provided her with a 6 inch Ace wrap, which she may use for comfort, instructing her to wrap it distally to approximately, not too tightly.  I have also provided her with quadricep strain strengthening/stretching exercises, though instructed her to not begin these for at least another week, and if she experiences pain with any of them then to stop.  I have placed a formal referral for physical therapy she will let us  know if she is not improving, and we will refer her to our orthopedic colleagues if needed. - PT referral - 6-inch ACE wrap provided - RICE, Tylenol , lidocaine  patches, IcyHot, Voltaren  gel - referral to Orthopedics if not improving

## 2024-07-01 ENCOUNTER — Other Ambulatory Visit: Payer: Self-pay | Admitting: Licensed Clinical Social Worker

## 2024-07-02 NOTE — Progress Notes (Signed)
 Internal Medicine Clinic Attending  I was physically present during the key portions of the resident provided service and participated in the medical decision making of patient's management care. I reviewed pertinent patient test results.  The assessment, diagnosis, and plan were formulated together and I agree with the documentation in the resident's note.  Jeanelle Layman CROME, MD

## 2024-07-04 NOTE — Addendum Note (Signed)
 Addended by: Undrea Archbold L on: 07/04/2024 10:00 AM   Modules accepted: Level of Service

## 2024-07-06 ENCOUNTER — Other Ambulatory Visit: Payer: Self-pay

## 2024-07-22 ENCOUNTER — Other Ambulatory Visit: Payer: Self-pay | Admitting: Student

## 2024-07-22 DIAGNOSIS — R600 Localized edema: Secondary | ICD-10-CM

## 2024-07-22 NOTE — Telephone Encounter (Signed)
"  Duplicate medication refill request.  "

## 2024-07-23 ENCOUNTER — Ambulatory Visit: Admitting: Physical Therapy

## 2024-12-25 ENCOUNTER — Ambulatory Visit
